# Patient Record
Sex: Female | Born: 1960 | Hispanic: Yes | Marital: Married | Smoking: Never smoker
Health system: Southern US, Community
[De-identification: ages and names within clinical notes are randomized; demographics above are authoritative.]

## PROBLEM LIST (undated history)

## (undated) ENCOUNTER — Ambulatory Visit (HOSPITAL_COMMUNITY): Admission: EM | Payer: Medicare Other | Source: Home / Self Care

## (undated) ENCOUNTER — Emergency Department (HOSPITAL_COMMUNITY): Admission: EM | Payer: 59 | Source: Home / Self Care

## (undated) DIAGNOSIS — F32A Depression, unspecified: Secondary | ICD-10-CM

## (undated) DIAGNOSIS — Z9889 Other specified postprocedural states: Secondary | ICD-10-CM

## (undated) DIAGNOSIS — F4310 Post-traumatic stress disorder, unspecified: Secondary | ICD-10-CM

## (undated) DIAGNOSIS — F319 Bipolar disorder, unspecified: Secondary | ICD-10-CM

## (undated) DIAGNOSIS — F429 Obsessive-compulsive disorder, unspecified: Secondary | ICD-10-CM

## (undated) DIAGNOSIS — F419 Anxiety disorder, unspecified: Secondary | ICD-10-CM

## (undated) HISTORY — DX: Other specified postprocedural states: Z98.890

## (undated) HISTORY — PX: VEIN SURGERY: SHX48

---
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
## 2019-08-01 ENCOUNTER — Ambulatory Visit (HOSPITAL_COMMUNITY): Admission: AD | Admit: 2019-08-01 | Payer: Medicare Other | Source: Home / Self Care | Admitting: Psychiatry

## 2019-08-05 ENCOUNTER — Other Ambulatory Visit: Payer: Self-pay

## 2019-08-05 ENCOUNTER — Emergency Department (HOSPITAL_COMMUNITY)
Admission: EM | Admit: 2019-08-05 | Discharge: 2019-08-05 | Disposition: A | Discharge status: Home / Self Care | Payer: Medicare Other | Attending: Emergency Medicine | Admitting: Emergency Medicine

## 2019-08-05 ENCOUNTER — Emergency Department (HOSPITAL_COMMUNITY)
Admission: EM | Admit: 2019-08-05 | Discharge: 2019-08-05 | Disposition: A | Discharge status: Home / Self Care | Payer: Medicare Other

## 2019-08-05 DIAGNOSIS — F329 Major depressive disorder, single episode, unspecified: Secondary | ICD-10-CM | POA: Insufficient documentation

## 2019-08-05 DIAGNOSIS — Z5321 Procedure and treatment not carried out due to patient leaving prior to being seen by health care provider: Secondary | ICD-10-CM | POA: Diagnosis not present

## 2019-08-05 LAB — COMPREHENSIVE METABOLIC PANEL
ALT: 48 U/L — ABNORMAL HIGH (ref 0–44)
AST: 34 U/L (ref 15–41)
Albumin: 4.3 g/dL (ref 3.5–5.0)
Alkaline Phosphatase: 85 U/L (ref 38–126)
Anion gap: 6 (ref 5–15)
BUN: 6 mg/dL (ref 6–20)
CO2: 26 mmol/L (ref 22–32)
Calcium: 9.3 mg/dL (ref 8.9–10.3)
Chloride: 109 mmol/L (ref 98–111)
Creatinine, Ser: 0.63 mg/dL (ref 0.44–1.00)
GFR calc Af Amer: >60 mL/min (ref >60)
GFR calc non Af Amer: >60 mL/min (ref >60)
Glucose, Bld: 104 mg/dL — ABNORMAL HIGH (ref 70–99)
Potassium: 3.8 mmol/L (ref 3.5–5.1)
Sodium: 141 mmol/L (ref 135–145)
Total Bilirubin: 0.6 mg/dL (ref 0.3–1.2)
Total Protein: 7.8 g/dL (ref 6.5–8.1)

## 2019-08-05 LAB — I-STAT BETA HCG BLOOD, ED (MC, WL, AP ONLY): I-stat hCG, quantitative: 6.5 m[IU]/mL — ABNORMAL HIGH (ref <5)

## 2019-08-05 LAB — RAPID URINE DRUG SCREEN, HOSP PERFORMED
Amphetamines: NOT DETECTED
Barbiturates: NOT DETECTED
Benzodiazepines: POSITIVE — AB
Cocaine: NOT DETECTED
Opiates: NOT DETECTED
Tetrahydrocannabinol: POSITIVE — AB

## 2019-08-05 LAB — CBC
HCT: 42.9 % (ref 36.0–46.0)
Hemoglobin: 14 g/dL (ref 12.0–15.0)
MCH: 29.5 pg (ref 26.0–34.0)
MCHC: 32.6 g/dL (ref 30.0–36.0)
MCV: 90.5 fL (ref 80.0–100.0)
Platelets: 329 10*3/uL (ref 150–400)
RBC: 4.74 MIL/uL (ref 3.87–5.11)
RDW: 12.9 % (ref 11.5–15.5)
WBC: 8.8 10*3/uL (ref 4.0–10.5)
nRBC: 0 % (ref 0.0–0.2)

## 2019-08-05 LAB — ETHANOL: Alcohol, Ethyl (B): <10 mg/dL (ref <10)

## 2019-08-05 NOTE — ED Triage Notes (Signed)
Denies SI/HI, states she is depressed.

## 2019-08-05 NOTE — ED Provider Notes (Signed)
Patient left without being seen after triage.   Deliah Boston, PA-C 08/05/19 1726    Malvin Johns, MD 08/05/19 [phone]

## 2019-08-05 NOTE — ED Notes (Signed)
Pt not present in waiting room, not present in room. Triage nurse Harrison Mons RN verified pt not present in waiting room. Dawn Kelley. PA made aware.

## 2019-08-05 NOTE — ED Triage Notes (Signed)
Pt in [place] with spouse. Pt reports significant psychiatric history, was instructed by her PCP to come here for evaluation. Pt very adamant about not wanting to stay, just wants resources to help her. Pt states she weill not stay if her spouse is not allowed. Pt tearful in triage.

## 2019-08-06 ENCOUNTER — Encounter (HOSPITAL_COMMUNITY): Payer: Self-pay | Admitting: *Deleted

## 2019-08-06 ENCOUNTER — Emergency Department (HOSPITAL_COMMUNITY)
Admission: EM | Admit: 2019-08-06 | Discharge: 2019-08-06 | Disposition: A | Discharge status: Home / Self Care | Payer: Medicare Other | Attending: Emergency Medicine | Admitting: Emergency Medicine

## 2019-08-06 ENCOUNTER — Other Ambulatory Visit: Payer: Self-pay

## 2019-08-06 DIAGNOSIS — R1030 Lower abdominal pain, unspecified: Secondary | ICD-10-CM | POA: Insufficient documentation

## 2019-08-06 DIAGNOSIS — Z5321 Procedure and treatment not carried out due to patient leaving prior to being seen by health care provider: Secondary | ICD-10-CM | POA: Insufficient documentation

## 2019-08-06 DIAGNOSIS — R300 Dysuria: Secondary | ICD-10-CM | POA: Diagnosis not present

## 2019-08-06 HISTORY — DX: Post-traumatic stress disorder, unspecified: F43.10

## 2019-08-06 HISTORY — DX: Anxiety disorder, unspecified: F41.9

## 2019-08-06 HISTORY — DX: Bipolar disorder, unspecified: F31.9

## 2019-08-06 HISTORY — DX: Obsessive-compulsive disorder, unspecified: F42.9

## 2019-08-06 NOTE — ED Triage Notes (Signed)
Pt tearful in triage, states she had a break down 12 days ago. Recent medication changes back in February.  Recent UTI as well, continues to have symptoms-incont at night, lower abd pain, denies burning urination.  Poor appetite. Denies SI

## 2019-08-07 ENCOUNTER — Encounter (HOSPITAL_COMMUNITY): Payer: Self-pay | Admitting: Emergency Medicine

## 2019-08-07 ENCOUNTER — Other Ambulatory Visit: Payer: Self-pay

## 2019-08-07 ENCOUNTER — Emergency Department (HOSPITAL_COMMUNITY)
Admission: EM | Admit: 2019-08-07 | Discharge: 2019-08-07 | Disposition: A | Discharge status: Home / Self Care | Payer: Medicare Other | Attending: Emergency Medicine | Admitting: Emergency Medicine

## 2019-08-07 DIAGNOSIS — Z79899 Other long term (current) drug therapy: Secondary | ICD-10-CM | POA: Diagnosis not present

## 2019-08-07 DIAGNOSIS — F4310 Post-traumatic stress disorder, unspecified: Secondary | ICD-10-CM | POA: Insufficient documentation

## 2019-08-07 DIAGNOSIS — F419 Anxiety disorder, unspecified: Secondary | ICD-10-CM | POA: Insufficient documentation

## 2019-08-07 DIAGNOSIS — F319 Bipolar disorder, unspecified: Secondary | ICD-10-CM | POA: Insufficient documentation

## 2019-08-07 LAB — COMPREHENSIVE METABOLIC PANEL
ALT: 51 U/L — ABNORMAL HIGH (ref 0–44)
AST: 33 U/L (ref 15–41)
Albumin: 4.4 g/dL (ref 3.5–5.0)
Alkaline Phosphatase: 85 U/L (ref 38–126)
Anion gap: 12 (ref 5–15)
BUN: 6 mg/dL (ref 6–20)
CO2: 26 mmol/L (ref 22–32)
Calcium: 9.6 mg/dL (ref 8.9–10.3)
Chloride: 104 mmol/L (ref 98–111)
Creatinine, Ser: 0.54 mg/dL (ref 0.44–1.00)
GFR calc Af Amer: >60 mL/min (ref >60)
GFR calc non Af Amer: >60 mL/min (ref >60)
Glucose, Bld: 118 mg/dL — ABNORMAL HIGH (ref 70–99)
Potassium: 3.9 mmol/L (ref 3.5–5.1)
Sodium: 142 mmol/L (ref 135–145)
Total Bilirubin: 0.4 mg/dL (ref 0.3–1.2)
Total Protein: 8.2 g/dL — ABNORMAL HIGH (ref 6.5–8.1)

## 2019-08-07 LAB — ETHANOL: Alcohol, Ethyl (B): <10 mg/dL (ref <10)

## 2019-08-07 LAB — RAPID URINE DRUG SCREEN, HOSP PERFORMED
Amphetamines: NOT DETECTED
Barbiturates: NOT DETECTED
Benzodiazepines: NOT DETECTED
Cocaine: NOT DETECTED
Opiates: NOT DETECTED
Tetrahydrocannabinol: POSITIVE — AB

## 2019-08-07 LAB — CBC WITH DIFFERENTIAL/PLATELET
Abs Immature Granulocytes: 0.02 10*3/uL (ref 0.00–0.07)
Basophils Absolute: 0 10*3/uL (ref 0.0–0.1)
Basophils Relative: 0 %
Eosinophils Absolute: 0.1 10*3/uL (ref 0.0–0.5)
Eosinophils Relative: 1 %
HCT: 44.2 % (ref 36.0–46.0)
Hemoglobin: 14.6 g/dL (ref 12.0–15.0)
Immature Granulocytes: 0 %
Lymphocytes Relative: 30 %
Lymphs Abs: 2.3 10*3/uL (ref 0.7–4.0)
MCH: 29.7 pg (ref 26.0–34.0)
MCHC: 33 g/dL (ref 30.0–36.0)
MCV: 90 fL (ref 80.0–100.0)
Monocytes Absolute: 0.3 10*3/uL (ref 0.1–1.0)
Monocytes Relative: 5 %
Neutro Abs: 4.9 10*3/uL (ref 1.7–7.7)
Neutrophils Relative %: 64 %
Platelets: 324 10*3/uL (ref 150–400)
RBC: 4.91 MIL/uL (ref 3.87–5.11)
RDW: 12.8 % (ref 11.5–15.5)
WBC: 7.6 10*3/uL (ref 4.0–10.5)
nRBC: 0 % (ref 0.0–0.2)

## 2019-08-07 MED ORDER — HYDROXYZINE HCL 25 MG PO TABS
50.0000 mg | ORAL_TABLET | Freq: Once | ORAL | Status: DC
  Filled 2019-08-07: qty 2

## 2019-08-07 NOTE — ED Notes (Signed)
TTS at bedside. 

## 2019-08-07 NOTE — ED Notes (Signed)
Pt states she does not want to do inpatient treatment. Have notified [facility] of this and they will get back in touch with me.
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
## 2019-08-07 NOTE — BH Assessment (Addendum)
Disposition: Dawn Loveless, DNP recommends in patient treatment. Patient's RN, Charlena Cross, informed of disposition. Reported to RN if patient had concerns about disposition I could tele-psych.  Provider saw patient. She recommends patient be psych cleared with a referral to PHP.

## 2019-08-07 NOTE — Discharge Instructions (Addendum)
Follow-up per behavioral health recommendations for medication management and further treatment.

## 2019-09-26 ENCOUNTER — Other Ambulatory Visit: Payer: Self-pay

## 2019-09-26 ENCOUNTER — Encounter (HOSPITAL_COMMUNITY): Payer: Self-pay

## 2019-09-26 ENCOUNTER — Emergency Department (HOSPITAL_COMMUNITY)
Admission: EM | Admit: 2019-09-26 | Discharge: 2019-09-27 | Disposition: A | Discharge status: Home / Self Care | Payer: Medicare Other | Attending: Emergency Medicine | Admitting: Emergency Medicine

## 2019-09-26 DIAGNOSIS — Z79899 Other long term (current) drug therapy: Secondary | ICD-10-CM | POA: Insufficient documentation

## 2019-09-26 DIAGNOSIS — F319 Bipolar disorder, unspecified: Secondary | ICD-10-CM | POA: Diagnosis not present

## 2019-09-26 DIAGNOSIS — F4310 Post-traumatic stress disorder, unspecified: Secondary | ICD-10-CM | POA: Insufficient documentation

## 2019-09-26 DIAGNOSIS — F419 Anxiety disorder, unspecified: Secondary | ICD-10-CM | POA: Diagnosis not present

## 2019-09-26 DIAGNOSIS — F32A Depression, unspecified: Secondary | ICD-10-CM

## 2019-09-26 DIAGNOSIS — F320 Major depressive disorder, single episode, mild: Secondary | ICD-10-CM | POA: Diagnosis present
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
## 2019-09-27 ENCOUNTER — Encounter (HOSPITAL_COMMUNITY): Payer: Self-pay | Admitting: Emergency Medicine

## 2019-09-27 ENCOUNTER — Emergency Department (HOSPITAL_COMMUNITY)
Admission: EM | Admit: 2019-09-27 | Discharge: 2019-09-28 | Disposition: A | Discharge status: Home / Self Care | Payer: Medicare Other | Source: Home / Self Care | Attending: Emergency Medicine | Admitting: Emergency Medicine

## 2019-09-27 ENCOUNTER — Other Ambulatory Visit: Payer: Self-pay

## 2019-09-27 DIAGNOSIS — Z79899 Other long term (current) drug therapy: Secondary | ICD-10-CM | POA: Insufficient documentation

## 2019-09-27 DIAGNOSIS — F319 Bipolar disorder, unspecified: Secondary | ICD-10-CM | POA: Diagnosis not present

## 2019-09-27 DIAGNOSIS — F419 Anxiety disorder, unspecified: Secondary | ICD-10-CM

## 2019-09-27 LAB — CBC WITH DIFFERENTIAL/PLATELET
Abs Immature Granulocytes: 0.02 10*3/uL (ref 0.00–0.07)
Basophils Absolute: 0.1 10*3/uL (ref 0.0–0.1)
Basophils Relative: 1 %
Eosinophils Absolute: 0.2 10*3/uL (ref 0.0–0.5)
Eosinophils Relative: 2 %
HCT: 42.3 % (ref 36.0–46.0)
Hemoglobin: 13.5 g/dL (ref 12.0–15.0)
Immature Granulocytes: 0 %
Lymphocytes Relative: 37 %
Lymphs Abs: 3.2 10*3/uL (ref 0.7–4.0)
MCH: 29.6 pg (ref 26.0–34.0)
MCHC: 31.9 g/dL (ref 30.0–36.0)
MCV: 92.8 fL (ref 80.0–100.0)
Monocytes Absolute: 0.5 10*3/uL (ref 0.1–1.0)
Monocytes Relative: 6 %
Neutro Abs: 4.6 10*3/uL (ref 1.7–7.7)
Neutrophils Relative %: 54 %
Platelets: 315 10*3/uL (ref 150–400)
RBC: 4.56 MIL/uL (ref 3.87–5.11)
RDW: 13.2 % (ref 11.5–15.5)
WBC: 8.5 10*3/uL (ref 4.0–10.5)
nRBC: 0 % (ref 0.0–0.2)

## 2019-09-27 LAB — COMPREHENSIVE METABOLIC PANEL
ALT: 35 U/L (ref 0–44)
AST: 27 U/L (ref 15–41)
Albumin: 4.1 g/dL (ref 3.5–5.0)
Alkaline Phosphatase: 84 U/L (ref 38–126)
Anion gap: 9 (ref 5–15)
BUN: 15 mg/dL (ref 6–20)
CO2: 28 mmol/L (ref 22–32)
Calcium: 9.1 mg/dL (ref 8.9–10.3)
Chloride: 102 mmol/L (ref 98–111)
Creatinine, Ser: 0.5 mg/dL (ref 0.44–1.00)
GFR calc Af Amer: >60 mL/min (ref >60)
GFR calc non Af Amer: >60 mL/min (ref >60)
Glucose, Bld: 102 mg/dL — ABNORMAL HIGH (ref 70–99)
Potassium: 3.8 mmol/L (ref 3.5–5.1)
Sodium: 139 mmol/L (ref 135–145)
Total Bilirubin: 0.4 mg/dL (ref 0.3–1.2)
Total Protein: 7.9 g/dL (ref 6.5–8.1)

## 2019-09-27 LAB — ETHANOL: Alcohol, Ethyl (B): <10 mg/dL (ref <10)

## 2019-09-27 NOTE — Discharge Instructions (Addendum)
You were evaluated in the Emergency Department and after careful evaluation, we did not find any emergent condition requiring admission or further testing in the hospital.  Your exam/testing today is overall reassuring.  Please keep your follow-up appointment on Wednesday.  Please return to the Emergency Department if you experience any worsening of your condition.  We encourage you to follow up with a primary care provider.  Thank you for allowing [place] to be a part of your care.

## 2019-09-27 NOTE — ED Triage Notes (Signed)
Patient states that her medications are not working for her. Patient has had several medication changes. Patient states that she is unable to sleep and has a hx of PTSD. Patient states highs and lows with her mood. Patient states that she needs help with her medications and that her PTSD is getting worse. Patient states that she is not SI or HI but needs help.
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
## 2019-09-28 DIAGNOSIS — F319 Bipolar disorder, unspecified: Secondary | ICD-10-CM | POA: Diagnosis not present

## 2019-09-28 MED ORDER — ALPRAZOLAM 0.5 MG PO TABS
1.0000 mg | ORAL_TABLET | Freq: Once | ORAL | Status: AC
  Administered 2019-09-28: 1 mg via ORAL
  Filled 2019-09-28: qty 2

## 2019-09-28 MED ORDER — ALPRAZOLAM 1 MG PO TABS: 1.0000 mg | ORAL_TABLET | ORAL | 0 refills | Status: DC | PRN

## 2019-09-28 NOTE — ED Provider Notes (Signed)
[facility] EMERGENCY DEPARTMENT Provider Note   CSN: [id number] Arrival date & time: 09/27/19  2319     History Chief Complaint  Patient presents with  . Medication not working    Dawn Kelley is a 59 y.o. female.  Patient seen here yesterday for the same.  She has severe anxiety and PTSD issues.  She is try to get her medications worked out but feels like she is spiraling down.  She still not suicidal or homicidal.  She states that every aspect of her life is being affected.  She has trouble bathing she has trouble with interpersonal relationships.  No hallucinations or delusions.     Past Medical History:  Diagnosis Date  . Anxiety   . Bipolar 1 disorder ([place])   . OCD (obsessive compulsive disorder)   . PTSD (post-traumatic stress disorder)     There are no problems to display for this patient.   Past Surgical History:  Procedure Laterality Date  . VEIN SURGERY       OB History   No obstetric history on file.     History reviewed. No pertinent family history.  Social History   Tobacco Use  . Smoking status: Never Smoker  . Smokeless tobacco: Never Used  Substance Use Topics  . Alcohol use: Not Currently    Comment: occ.   . Drug use: Yes    Types: Marijuana    Home Medications Prior to Admission medications   Medication Sig Start Date End Date Taking? Authorizing Provider  ALPRAZolam Duanne Moron) 1 MG tablet Take 1 tablet (1 mg total) by mouth as needed for anxiety. 09/28/19   Taino Maertens, Corene Cornea, MD  ARIPiprazole (ABILIFY) 15 MG tablet Take 15 mg by mouth daily. 08/03/19   [provider]  citalopram (CELEXA) 40 MG tablet Take 40 mg by mouth daily. 06/22/19   [provider]  clonazePAM (KLONOPIN) 0.5 MG tablet Take 0.5 mg by mouth 3 (three) times daily as needed. 07/27/19   [provider]  loratadine (CLARITIN) 10 MG tablet Take 10 mg by mouth daily.    [provider]  nitrofurantoin, macrocrystal-monohydrate, (MACROBID) 100  MG capsule Take 100 mg by mouth 2 (two) times daily. 08/03/19   [provider]    Allergies    Seroquel [quetiapine fumarate]  Review of Systems   Review of Systems  All other systems reviewed and are negative.   Physical Exam Updated Vital Signs BP (!) 126/55 (BP Location: Right Arm)   Pulse (!) 59   Temp 97.7 F (36.5 C) (Oral)   Resp 18   Ht 5\' 5"  (1.651 m)   Wt 80.7 kg   SpO2 100%   BMI 29.62 kg/m   Physical Exam Vitals and nursing note reviewed.  Constitutional:      Appearance: She is well-developed.  HENT:     Head: Normocephalic and atraumatic.     Mouth/Throat:     Mouth: Mucous membranes are dry.     Pharynx: Oropharynx is clear.  Eyes:     Conjunctiva/sclera: Conjunctivae normal.  Cardiovascular:     Rate and Rhythm: Normal rate and regular rhythm.  Pulmonary:     Effort: No respiratory distress.     Breath sounds: No stridor.  Abdominal:     General: There is no distension.  Musculoskeletal:        General: Normal range of motion.     Cervical back: Normal range of motion.  Skin:    General: Skin is  warm and dry.  Neurological:     General: No focal deficit present.     Mental Status: She is alert.     ED Results / Procedures / Treatments   Labs (all labs ordered are listed, but only abnormal results are displayed) Labs Reviewed - No data to display  EKG None  Radiology No results found.  Procedures Procedures (including critical care time)  Medications Ordered in ED Medications  ALPRAZolam (XANAX) tablet 1 mg (1 mg Oral Given 09/28/19 0042)    ED Course  I have reviewed the triage vital signs and the nursing notes.  Pertinent labs & imaging results that were available during my care of the patient were reviewed by me and considered in my medical decision making (see chart for details).    MDM Rules/Calculators/A&P   I gave the patient option of reevaluation by TTS versus overnight observation under my care versus  trying Xanax instead of Klonopin for her acute changes.  After some thought and discussion with her husband she decided that she would like to try the Xanax and get a prescription for the same.  She will not take her Klonopin same time.  She will call her psychiatrist tomorrow for further management of her chronic issues.  She is not suicidal or homicidal.  There is no indication for emergent treatment of her symptoms.  Final Clinical Impression(s) / ED Diagnoses Final diagnoses:  Anxiety    Rx / DC Orders ED Discharge Orders         Ordered    ALPRAZolam Duanne Moron) 1 MG tablet  As needed     09/28/19 0043           Giamarie Bueche, Corene Cornea, MD 09/28/19 [phone]

## 2019-09-28 NOTE — Discharge Instructions (Addendum)
Do not take xanax and klonopin together.  Call your psychiatrist tomorrow to try to get an earlier appointment if possible please.   Please return for worsening symptoms or thoughts of hurting self or others.

## 2019-10-01 ENCOUNTER — Emergency Department (HOSPITAL_COMMUNITY)
Admission: EM | Admit: 2019-10-01 | Discharge: 2019-10-02 | Disposition: A | Discharge status: Other Acute Inpatient Hospital | Payer: Medicare Other | Attending: Emergency Medicine | Admitting: Emergency Medicine

## 2019-10-01 ENCOUNTER — Other Ambulatory Visit: Payer: Self-pay

## 2019-10-01 ENCOUNTER — Encounter (HOSPITAL_COMMUNITY): Payer: Self-pay | Admitting: *Deleted

## 2019-10-01 DIAGNOSIS — F411 Generalized anxiety disorder: Secondary | ICD-10-CM | POA: Diagnosis not present

## 2019-10-01 DIAGNOSIS — F4310 Post-traumatic stress disorder, unspecified: Secondary | ICD-10-CM | POA: Insufficient documentation

## 2019-10-01 DIAGNOSIS — Z20822 Contact with and (suspected) exposure to covid-19: Secondary | ICD-10-CM | POA: Insufficient documentation

## 2019-10-01 DIAGNOSIS — F332 Major depressive disorder, recurrent severe without psychotic features: Secondary | ICD-10-CM | POA: Insufficient documentation

## 2019-10-01 DIAGNOSIS — R45851 Suicidal ideations: Secondary | ICD-10-CM | POA: Diagnosis not present

## 2019-10-01 LAB — RAPID URINE DRUG SCREEN, HOSP PERFORMED
Amphetamines: NOT DETECTED
Barbiturates: NOT DETECTED
Benzodiazepines: POSITIVE — AB
Cocaine: NOT DETECTED
Opiates: NOT DETECTED
Tetrahydrocannabinol: POSITIVE — AB

## 2019-10-01 LAB — CBC WITH DIFFERENTIAL/PLATELET
Abs Immature Granulocytes: 0.03 10*3/uL (ref 0.00–0.07)
Basophils Absolute: 0 10*3/uL (ref 0.0–0.1)
Basophils Relative: 0 %
Eosinophils Absolute: 0 10*3/uL (ref 0.0–0.5)
Eosinophils Relative: 0 %
HCT: 41.5 % (ref 36.0–46.0)
Hemoglobin: 13.4 g/dL (ref 12.0–15.0)
Immature Granulocytes: 0 %
Lymphocytes Relative: 25 %
Lymphs Abs: 2.2 10*3/uL (ref 0.7–4.0)
MCH: 29.9 pg (ref 26.0–34.0)
MCHC: 32.3 g/dL (ref 30.0–36.0)
MCV: 92.6 fL (ref 80.0–100.0)
Monocytes Absolute: 0.4 10*3/uL (ref 0.1–1.0)
Monocytes Relative: 4 %
Neutro Abs: 6.3 10*3/uL (ref 1.7–7.7)
Neutrophils Relative %: 71 %
Platelets: 322 10*3/uL (ref 150–400)
RBC: 4.48 MIL/uL (ref 3.87–5.11)
RDW: 13.1 % (ref 11.5–15.5)
WBC: 8.9 10*3/uL (ref 4.0–10.5)
nRBC: 0 % (ref 0.0–0.2)

## 2019-10-01 LAB — BASIC METABOLIC PANEL
Anion gap: 9 (ref 5–15)
BUN: 8 mg/dL (ref 6–20)
CO2: 29 mmol/L (ref 22–32)
Calcium: 9.5 mg/dL (ref 8.9–10.3)
Chloride: 101 mmol/L (ref 98–111)
Creatinine, Ser: 0.52 mg/dL (ref 0.44–1.00)
GFR calc Af Amer: >60 mL/min (ref >60)
GFR calc non Af Amer: >60 mL/min (ref >60)
Glucose, Bld: 108 mg/dL — ABNORMAL HIGH (ref 70–99)
Potassium: 4 mmol/L (ref 3.5–5.1)
Sodium: 139 mmol/L (ref 135–145)

## 2019-10-01 LAB — ACETAMINOPHEN LEVEL: Acetaminophen (Tylenol), Serum: <10 ug/mL — ABNORMAL LOW (ref 10–30)

## 2019-10-01 LAB — SALICYLATE LEVEL: Salicylate Lvl: <7 mg/dL — ABNORMAL LOW (ref 7.0–30.0)

## 2019-10-01 LAB — ETHANOL: Alcohol, Ethyl (B): <10 mg/dL (ref <10)

## 2019-10-01 LAB — PREGNANCY, URINE: Preg Test, Ur: NEGATIVE

## 2019-10-01 MED ORDER — ALPRAZOLAM 0.5 MG PO TABS
1.0000 mg | ORAL_TABLET | ORAL | Status: DC | PRN
  Administered 2019-10-01 – 2019-10-02 (×3): 1 mg via ORAL
  Filled 2019-10-01 (×3): qty 2

## 2019-10-01 MED ORDER — MELATONIN 3 MG PO TABS
3.0000 mg | ORAL_TABLET | Freq: Every day | ORAL | Status: DC
  Administered 2019-10-01: 3 mg via ORAL
  Filled 2019-10-01: qty 1

## 2019-10-01 MED ORDER — ARIPIPRAZOLE 5 MG PO TABS
10.0000 mg | ORAL_TABLET | Freq: Every day | ORAL | Status: DC
  Administered 2019-10-02: 10 mg via ORAL
  Filled 2019-10-01 (×2): qty 2

## 2019-10-01 MED ORDER — FLUOXETINE HCL 20 MG PO CAPS
40.0000 mg | ORAL_CAPSULE | Freq: Every day | ORAL | Status: DC
  Administered 2019-10-02: 40 mg via ORAL
  Filled 2019-10-01 (×2): qty 2

## 2019-10-01 NOTE — ED Notes (Signed)
Pt TTS'd
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
## 2019-10-01 NOTE — ED Triage Notes (Signed)
Pt with SI, plan to jump in front of a truck.

## 2019-10-01 NOTE — ED Provider Notes (Signed)
[facility] Provider Note   CSN: [id number] Arrival date & time: 10/01/19  1321     History Chief Complaint  Patient presents with  . Suicidal    Dawn Kelley is a 59 y.o. female past medical history of anxiety, bipolar, PTSD who presents for evaluation of suicidal ideations.  She reports she has been on Klonopin for a very long time to help with her anxiety and depression.  She states that a few months ago, the Klonopin stopped working and she was unable to have improvement in her symptoms.  She was switched and her medications were adjusted.  She had started Abilify and Prozac.  Patient reports that over the last few months since switching those medications, she is continued have worsening depression, anxiety.  She feels like it is worse in the last several weeks.  She states that she cannot even take a shower because she gets so anxious that she is going to drown and she ends up swallowing the water.  She states she has not been able to do any of her daily activities and can barely get out of bed.  She reports that over the last few days, she started having suicidal ideations.  She states that she started noticing that "I 60 was right out front of her door and she thought about jumping in front of a truck to end it."  She states "I am too much of a coward to take all my pills so I would do it that way."  She does report eating increased stress as her daughter was recently put in prison which she states has been contributing to her anxiety and depression.  She does endorse drinking alcohol and drinks about 1-2 beers over the last few days.  She does intermittently smoke marijuana.  Unsure when her last time was.  No cocaine use.  The history is provided by the patient.       Past Medical History:  Diagnosis Date  . Anxiety   . Bipolar 1 disorder ([place])   . OCD (obsessive compulsive disorder)   . PTSD (post-traumatic stress disorder)     There are no problems to  display for this patient.   Past Surgical History:  Procedure Laterality Date  . VEIN SURGERY       OB History   No obstetric history on file.     History reviewed. No pertinent family history.  Social History   Tobacco Use  . Smoking status: Never Smoker  . Smokeless tobacco: Never Used  Substance Use Topics  . Alcohol use: Not Currently    Comment: occ.   . Drug use: Yes    Types: Marijuana    Home Medications Prior to Admission medications   Medication Sig Start Date End Date Taking? Authorizing Provider  ALPRAZolam Duanne Moron) 1 MG tablet Take 1 tablet (1 mg total) by mouth as needed for anxiety. 09/28/19  Yes Mesner, Corene Cornea, MD  ARIPiprazole (ABILIFY) 10 MG tablet Take 10 mg by mouth daily.  08/03/19  Yes [provider]  ergocalciferol (VITAMIN D2) 1.25 MG (50000 UT) capsule Take 50,000 Units by mouth once a week.   Yes [provider]  FLUoxetine (PROZAC) 40 MG capsule Take 40 mg by mouth daily. 09/21/19  Yes [provider]  loratadine (CLARITIN) 10 MG tablet Take 10 mg by mouth daily.   Yes [provider]  Melatonin 5 MG CAPS Take 5 mg by mouth at bedtime.   Yes [provider]    Allergies    Seroquel [quetiapine fumarate]  Review of Systems   Review of Systems  Psychiatric/Behavioral: Positive for dysphoric mood and self-injury. The patient is nervous/anxious.   All other systems reviewed and are negative.   Physical Exam Updated Vital Signs BP 127/60 (BP Location: Right Arm)   Pulse 86   Temp (!) 97 F (36.1 C) (Temporal)   Resp (!) 22   Ht 5' 4.5" (1.638 m)   Wt 83.5 kg   SpO2 96%   BMI 31.10 kg/m   Physical Exam Vitals and nursing note reviewed.  Constitutional:      Appearance: Normal appearance. She is well-developed.  HENT:     Head: Normocephalic and atraumatic.  Eyes:     General: Lids are normal.     Conjunctiva/sclera: Conjunctivae normal.     Pupils: Pupils are equal, round, and reactive  to light.  Cardiovascular:     Rate and Rhythm: Normal rate and regular rhythm.     Pulses: Normal pulses.     Heart sounds: Normal heart sounds. No murmur. No friction rub. No gallop.   Pulmonary:     Effort: Pulmonary effort is normal.     Breath sounds: Normal breath sounds.  Abdominal:     Palpations: Abdomen is soft. Abdomen is not rigid.     Tenderness: There is no abdominal tenderness. There is no guarding.  Musculoskeletal:        General: Normal range of motion.     Cervical back: Full passive range of motion without pain.  Skin:    General: Skin is warm and dry.     Capillary Refill: Capillary refill takes less than 2 seconds.  Neurological:     Mental Status: She is alert and oriented to person, place, and time.  Psychiatric:        Speech: Speech normal.        Thought Content: Thought content includes suicidal ideation. Thought content does not include homicidal ideation. Thought content includes suicidal plan. Thought content does not include homicidal plan.     Comments: Intermittently tearful, anxious. Makes good eye contact.     ED Results / Procedures / Treatments   Labs (all labs ordered are listed, but only abnormal results are displayed) Labs Reviewed  BASIC METABOLIC PANEL - Abnormal; Notable for the following components:      Result Value   Glucose, Bld 108 (*)    All other components within normal limits  ACETAMINOPHEN LEVEL - Abnormal; Notable for the following components:   Acetaminophen (Tylenol), Serum <10 (*)    All other components within normal limits  SALICYLATE LEVEL - Abnormal; Notable for the following components:   Salicylate Lvl [id number] (*)    All other components within normal limits  RAPID URINE DRUG SCREEN, HOSP PERFORMED - Abnormal; Notable for the following components:   Benzodiazepines POSITIVE (*)    Tetrahydrocannabinol POSITIVE (*)    All other components within normal limits  SARS CORONAVIRUS 2 BY RT PCR (HOSPITAL ORDER,  [place] [facility])  CBC WITH DIFFERENTIAL/PLATELET  ETHANOL  PREGNANCY, URINE    EKG None  Radiology No results found.  Procedures Procedures (including critical care time)  Medications Ordered in ED Medications  ALPRAZolam (XANAX) tablet 1 mg (has no administration in time range)  ARIPiprazole (ABILIFY) tablet 10 mg (has no administration in time range)  FLUoxetine (PROZAC) capsule 40 mg (has no administration in time range)  melatonin tablet 3  mg (has no administration in time range)    ED Course  I have reviewed the triage vital signs and the nursing notes.  Pertinent labs & imaging results that were available during my care of the patient were reviewed by me and considered in my medical decision making (see chart for details).    MDM Rules/Calculators/A&P                      59 year old female who presents for evaluation of suicidal ideation.  Reports worsening depression over the last few months after switching her Klonopin to a different medication.  She states that she thought about jumping out in the highway in front of a truck so that she would die.  On initially arrival, she is afebrile, nontoxic-appearing.  Vital signs are stable.  We will plan for medical clearance labs, TTS consultation.  Patient is voluntary.  Salicylate, acetaminophen, ethanol level unremarkable.  BMP normal.  CBC without any acute abnormalities.  UDS is positive for benzos and marijuana.  Patient is medically cleared.  She is pending TTS consult.  TTS is recommended inpatient.  Patient is voluntary at this time.  Portions of this note were generated with [employer]. Dictation errors may occur despite best attempts at proofreading.   Final Clinical Impression(s) / ED Diagnoses Final diagnoses:  Suicidal ideation    Rx / DC Orders ED Discharge Orders    None       Desma Mcgregor 10/01/19 2111    Fredia Sorrow, MD 11/03/19 Bosie Helper

## 2019-10-01 NOTE — BHH Counselor (Signed)
[facility] providers Mordecai Maes, PMHNP and Letitia Libra, FNP reviewed patient's chart and active prescriptions. Patient last had Klonopin filled on 5/6 and has a current Xanax prescription. Apolonio Schneiders, RN notified. She states they are aware at [facility] ED.

## 2019-10-02 DIAGNOSIS — F411 Generalized anxiety disorder: Secondary | ICD-10-CM | POA: Diagnosis not present

## 2019-10-02 LAB — SARS CORONAVIRUS 2 BY RT PCR (HOSPITAL ORDER, PERFORMED IN ~~LOC~~ HOSPITAL LAB): SARS Coronavirus 2: NEGATIVE

## 2019-10-02 MED ORDER — SENNOSIDES-DOCUSATE SODIUM 8.6-50 MG PO TABS
1.0000 | ORAL_TABLET | Freq: Once | ORAL | Status: AC
  Administered 2019-10-02: 1 via ORAL
  Filled 2019-10-02 (×2): qty 1

## 2019-10-02 NOTE — ED Notes (Signed)
Transport here to pick up patient.

## 2019-10-02 NOTE — ED Notes (Signed)
Called [facility] to transport Pt to [facility].

## 2019-10-02 NOTE — Progress Notes (Signed)
Pt accepted to [facility]   Call report to [phone]  Eboni @ [facility] ED notified.     Pt is voluntary and will be transported by [employer]    Pt may arrive to [facility] after testing negative for Covid.   Audree Camel, LCSW, [place] Disposition [facility] B[facility]/TTS [phone]

## 2019-10-02 NOTE — ED Notes (Signed)
Pt is requesting a stool softener. Will notify doctor

## 2019-10-02 NOTE — ED Notes (Signed)
Pt is ambulatory to the restroom

## 2019-10-02 NOTE — Progress Notes (Signed)
Pt meets inpatient criteria per Letitia Libra, NP. Referral information has been sent to the following hospitals for review:  [facility] Details  [facility] Details [facility] Details  [employer] [facility] Details [facility] Details [facility]  Disposition will continue to follow.   Audree Camel, LCSW, [place] Disposition [facility]/TTS [phone]

## 2019-10-21 ENCOUNTER — Encounter (HOSPITAL_COMMUNITY): Payer: Self-pay | Admitting: Emergency Medicine

## 2019-10-21 ENCOUNTER — Emergency Department (HOSPITAL_COMMUNITY)
Admission: EM | Admit: 2019-10-21 | Discharge: 2019-10-21 | Disposition: A | Discharge status: Home / Self Care | Payer: Medicare Other | Attending: Emergency Medicine | Admitting: Emergency Medicine

## 2019-10-21 DIAGNOSIS — F419 Anxiety disorder, unspecified: Secondary | ICD-10-CM | POA: Insufficient documentation

## 2019-10-21 DIAGNOSIS — Z20822 Contact with and (suspected) exposure to covid-19: Secondary | ICD-10-CM | POA: Insufficient documentation

## 2019-10-21 DIAGNOSIS — Z79899 Other long term (current) drug therapy: Secondary | ICD-10-CM | POA: Diagnosis not present

## 2019-10-21 DIAGNOSIS — Z888 Allergy status to other drugs, medicaments and biological substances status: Secondary | ICD-10-CM | POA: Insufficient documentation

## 2019-10-21 LAB — COMPREHENSIVE METABOLIC PANEL
ALT: 22 U/L (ref 0–44)
AST: 15 U/L (ref 15–41)
Albumin: 3.9 g/dL (ref 3.5–5.0)
Alkaline Phosphatase: 85 U/L (ref 38–126)
Anion gap: 12 (ref 5–15)
BUN: 10 mg/dL (ref 6–20)
CO2: 26 mmol/L (ref 22–32)
Calcium: 9.3 mg/dL (ref 8.9–10.3)
Chloride: 101 mmol/L (ref 98–111)
Creatinine, Ser: 0.63 mg/dL (ref 0.44–1.00)
GFR calc Af Amer: >60 mL/min (ref >60)
GFR calc non Af Amer: >60 mL/min (ref >60)
Glucose, Bld: 108 mg/dL — ABNORMAL HIGH (ref 70–99)
Potassium: 4.3 mmol/L (ref 3.5–5.1)
Sodium: 139 mmol/L (ref 135–145)
Total Bilirubin: 0.3 mg/dL (ref 0.3–1.2)
Total Protein: 7.6 g/dL (ref 6.5–8.1)

## 2019-10-21 LAB — CBC WITH DIFFERENTIAL/PLATELET
Abs Immature Granulocytes: 0.02 10*3/uL (ref 0.00–0.07)
Basophils Absolute: 0 10*3/uL (ref 0.0–0.1)
Basophils Relative: 0 %
Eosinophils Absolute: 0 10*3/uL (ref 0.0–0.5)
Eosinophils Relative: 0 %
HCT: 42.7 % (ref 36.0–46.0)
Hemoglobin: 13.9 g/dL (ref 12.0–15.0)
Immature Granulocytes: 0 %
Lymphocytes Relative: 22 %
Lymphs Abs: 1.8 10*3/uL (ref 0.7–4.0)
MCH: 29.6 pg (ref 26.0–34.0)
MCHC: 32.6 g/dL (ref 30.0–36.0)
MCV: 91 fL (ref 80.0–100.0)
Monocytes Absolute: 0.4 10*3/uL (ref 0.1–1.0)
Monocytes Relative: 5 %
Neutro Abs: 6 10*3/uL (ref 1.7–7.7)
Neutrophils Relative %: 73 %
Platelets: 337 10*3/uL (ref 150–400)
RBC: 4.69 MIL/uL (ref 3.87–5.11)
RDW: 12.9 % (ref 11.5–15.5)
WBC: 8.3 10*3/uL (ref 4.0–10.5)
nRBC: 0 % (ref 0.0–0.2)

## 2019-10-21 LAB — RAPID URINE DRUG SCREEN, HOSP PERFORMED
Amphetamines: NOT DETECTED
Barbiturates: NOT DETECTED
Benzodiazepines: POSITIVE — AB
Cocaine: NOT DETECTED
Opiates: NOT DETECTED
Tetrahydrocannabinol: NOT DETECTED

## 2019-10-21 LAB — ETHANOL: Alcohol, Ethyl (B): <10 mg/dL (ref <10)

## 2019-10-21 LAB — POC URINE PREG, ED: Preg Test, Ur: NEGATIVE

## 2019-10-21 LAB — SARS CORONAVIRUS 2 BY RT PCR (HOSPITAL ORDER, PERFORMED IN ~~LOC~~ HOSPITAL LAB): SARS Coronavirus 2: NEGATIVE

## 2019-10-21 MED ORDER — LORAZEPAM 1 MG PO TABS
1.0000 mg | ORAL_TABLET | Freq: Once | ORAL | Status: AC
  Administered 2019-10-21: 1 mg via ORAL
  Filled 2019-10-21: qty 1
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
## 2019-10-21 NOTE — ED Notes (Signed)
Attempted to call [facility] and inquire about outpatient resources. [place] reported would inform CSW on case. No answer. Pt came up to nurses station reporting ride was at [facility] and was fixing to leave. Discharge instructions reviewed.

## 2019-10-21 NOTE — Discharge Instructions (Signed)
Please see that attached list - for follow up  ER for worsening symptoms

## 2019-10-21 NOTE — ED Notes (Signed)
Per [facility], pt has been psych cleared and requested ED fax number to send over resources for pt.

## 2019-10-21 NOTE — Clinical Social Work Note (Signed)
Transition of Care [facility]) - Emergency Department Mini Assessment  Patient Details  Name: Dawn Kelley MRN: [id number] Date of Birth: 03-Aug-1960  Transition of Care [facility]) CM/SW Contact:    Sherie Don, LCSW Phone Number: 10/21/2019, 1:16 PM  Clinical Narrative: CSW reviewed patient's chart and patient needs assistance with mental health resources in the community now that she is psych cleared. CSW met with patient in the ED. Patient agreeable to CSW making a referral.  CSW called [facility] in [place] [phone]) and spoke with Tia to make referral. CSW met with patient again and explained patient will be contacted by [facility] to set up an initial appointment and patient will need to arrive early to fill out the new patient paperwork. CSW explained to patient it will be important for her to keep her mental health appointments to help her manage her anxiety and other symptoms long term. Patient appreciative of referral. EDP updated.  ED Mini Assessment: What brought you to the Emergency Department? : Paranoia Barriers to Discharge: ED Barriers Resolved Barrier interventions: Referral to [facility] for mental health counseling Means of departure: Car Interventions which prevented an admission or readmission: Other (must enter comment)(Referral for mental health counseling)  Patient Contact and Communications Key Contact 1: [facility] Contact Date: 10/21/19,   Contact time: [place] Contact Phone Number: [phone] Call outcome: Referral has been made  Patient states their goals for this hospitalization and ongoing recovery are:: Be able to cope with anxiety  Admission diagnosis:  PARANOID There are no problems to display for this patient.  PCP:  Wannetta Sender, FNP Pharmacy:   [facility], [place] [address] [place] [address] Phone: [phone] Fax: [phone]

## 2019-10-21 NOTE — ED Triage Notes (Signed)
Patient discharged from mental facility last week, and has not be able to cope since leaving facility.  Denies SI/HI.

## 2019-10-24 ENCOUNTER — Ambulatory Visit (HOSPITAL_COMMUNITY)
Admission: AD | Admit: 2019-10-24 | Discharge: 2019-10-24 | Disposition: A | Discharge status: Home / Self Care | Payer: Medicare Other | Attending: Psychiatry | Admitting: Psychiatry

## 2019-10-29 NOTE — Progress Notes (Deleted)
Psychiatric Initial Adult Assessment   Patient Identification: Dawn Kelley MRN:  [id number] Date of Evaluation:  10/29/2019 Referral Source: [facility]* Chief Complaint:   Visit Diagnosis: No diagnosis found.  History of Present Illness:   Dawn Kelley is a 59 y.o. year old female with a history of bipolar, OCD, PTSD per chart, stroke, who is referred for   Passive SI      Associated Signs/Symptoms: Depression Symptoms:  {DEPRESSION SYMPTOMS:20000} (Hypo) Manic Symptoms:  {BHH MANIC SYMPTOMS:22872} Anxiety Symptoms:  {BHH ANXIETY SYMPTOMS:22873} Psychotic Symptoms:  {BHH PSYCHOTIC SYMPTOMS:22874} PTSD Symptoms: {BHH PTSD SYMPTOMS:22875}  Past Psychiatric History: ***  Previous Psychotropic Medications: {YES/NO:21197}  Substance Abuse History in the last 12 months:  {yes no:314532}  Consequences of Substance Abuse: {BHH CONSEQUENCES OF SUBSTANCE ABUSE:22880}  Past Medical History:  Past Medical History:  Diagnosis Date  . Anxiety   . Bipolar 1 disorder ([place])   . OCD (obsessive compulsive disorder)   . PTSD (post-traumatic stress disorder)     Past Surgical History:  Procedure Laterality Date  . VEIN SURGERY      Family Psychiatric History:  Outpatient:  Psychiatry admission:  Previous suicide attempt:  Past trials of medication:  History of violence:   Family History: No family history on file.  Social History:   Social History   Socioeconomic History  . Marital status: Married    Spouse name: Not on file  . Number of children: Not on file  . Years of education: Not on file  . Highest education level: Not on file  Occupational History  . Occupation: Disabled  Tobacco Use  . Smoking status: Never Smoker  . Smokeless tobacco: Never Used  Substance and Sexual Activity  . Alcohol use: Not Currently    Comment: occ.   . Drug use: Yes    Types: Marijuana    Comment: Pt denied current use  . Sexual activity: Yes  Other Topics  Concern  . Not on file  Social History Narrative   Pt lives in [place] with husband.  She is on disability.  Pt stated that she receives outpatient psychiatry services through [facility].  She does not receive outpatient therapy.   Social Determinants of Health   Financial Resource Strain:   . Difficulty of Paying Living Expenses:   Food Insecurity:   . Worried About Charity fundraiser in the Last Year:   . Arboriculturist in the Last Year:   Transportation Needs:   . Film/video editor (Medical):   Marland Kitchen Lack of Transportation (Non-Medical):   Physical Activity:   . Days of Exercise per Week:   . Minutes of Exercise per Session:   Stress:   . Feeling of Stress :   Social Connections:   . Frequency of Communication with Friends and Family:   . Frequency of Social Gatherings with Friends and Family:   . Attends Religious Services:   . Active Member of Clubs or Organizations:   . Attends Archivist Meetings:   Marland Kitchen Marital Status:     Additional Social History: ***  Allergies:   Allergies  Allergen Reactions  . Seroquel [Quetiapine Fumarate] Shortness Of Breath    Metabolic Disorder Labs: No results found for: HGBA1C, MPG No results found for: PROLACTIN No results found for: CHOL, TRIG, HDL, CHOLHDL, VLDL, LDLCALC No results found for: TSH  Therapeutic Level Labs: No results found for: LITHIUM No results found for: CBMZ No results found for: VALPROATE  Current Medications: Current Outpatient Medications  Medication Sig Dispense Refill  . ALPRAZolam (XANAX) 1 MG tablet Take 1 tablet (1 mg total) by mouth as needed for anxiety. 30 tablet 0  . ARIPiprazole (ABILIFY) 10 MG tablet Take 10 mg by mouth daily.     . ergocalciferol (VITAMIN D2) 1.25 MG (50000 UT) capsule Take 50,000 Units by mouth once a week.    Marland Kitchen FLUoxetine (PROZAC) 40 MG capsule Take 40 mg by mouth daily.    Marland Kitchen loratadine (CLARITIN) 10 MG tablet Take 10 mg by mouth daily.    . Melatonin  5 MG CAPS Take 5 mg by mouth at bedtime.     No current facility-administered medications for this visit.    Musculoskeletal: Strength & Muscle Tone: N/A Gait & Station: N/A Patient leans: N/A  Psychiatric Specialty Exam: Review of Systems  There were no vitals taken for this visit.There is no height or weight on file to calculate BMI.  General Appearance: {Appearance:22683}  Eye Contact:  {BHH EYE CONTACT:22684}  Speech:  Clear and Coherent  Volume:  Normal  Mood:  {BHH MOOD:22306}  Affect:  {Affect (PAA):22687}  Thought Process:  Coherent  Orientation:  Full (Time, Place, and Person)  Thought Content:  Logical  Suicidal Thoughts:  {ST/HT (PAA):22692}  Homicidal Thoughts:  {ST/HT (PAA):22692}  Memory:  Immediate;   Good  Judgement:  {Judgement (PAA):22694}  Insight:  {Insight (PAA):22695}  Psychomotor Activity:  Normal  Concentration:  Concentration: Good and Attention Span: Good  Recall:  Good  Fund of Knowledge:Good  Language: Good  Akathisia:  No  Handed:  Right  AIMS (if indicated):  not done  Assets:  Communication Skills Desire for Improvement  ADL's:  Intact  Cognition: WNL  Sleep:  {BHH GOOD/FAIR/POOR:22877}   Screenings:   Assessment and Plan:    Plan  The patient demonstrates the following risk factors for suicide: Chronic risk factors for suicide include: {Chronic Risk Factors for DCVUDTH:43888757}. Acute risk factors for suicide include: {Acute Risk Factors for VJKQASU:01561537}. Protective factors for this patient include: {Protective Factors for Suicide HKFE:76147092}. Considering these factors, the overall suicide risk at this point appears to be {Desc; low/moderate/high:110033}. Patient {ACTION; IS/IS HVF:47340370} appropriate for outpatient follow up.   Norman Clay, MD 6/17/20213:51 PM

## 2019-11-04 ENCOUNTER — Telehealth (HOSPITAL_COMMUNITY): Payer: Medicare Other | Admitting: Psychiatry

## 2019-11-11 ENCOUNTER — Telehealth (HOSPITAL_COMMUNITY): Payer: Self-pay | Admitting: Psychiatry

## 2019-11-11 NOTE — Telephone Encounter (Signed)
Patients husband called in to advise patient is in the hospital and will schedule appointment when she is discharged.

## 2019-11-18 ENCOUNTER — Emergency Department (HOSPITAL_COMMUNITY)
Admission: EM | Admit: 2019-11-18 | Discharge: 2019-11-18 | Disposition: A | Discharge status: Home / Self Care | Payer: Medicare Other | Attending: Emergency Medicine | Admitting: Emergency Medicine

## 2019-11-18 ENCOUNTER — Emergency Department (HOSPITAL_COMMUNITY): Payer: Medicare Other

## 2019-11-18 ENCOUNTER — Encounter (HOSPITAL_COMMUNITY): Payer: Self-pay | Admitting: *Deleted

## 2019-11-18 ENCOUNTER — Other Ambulatory Visit: Payer: Self-pay

## 2019-11-18 DIAGNOSIS — N390 Urinary tract infection, site not specified: Secondary | ICD-10-CM | POA: Diagnosis not present

## 2019-11-18 DIAGNOSIS — Z79899 Other long term (current) drug therapy: Secondary | ICD-10-CM | POA: Insufficient documentation

## 2019-11-18 DIAGNOSIS — R2681 Unsteadiness on feet: Secondary | ICD-10-CM | POA: Diagnosis not present

## 2019-11-18 DIAGNOSIS — R251 Tremor, unspecified: Secondary | ICD-10-CM | POA: Diagnosis present

## 2019-11-18 LAB — RAPID URINE DRUG SCREEN, HOSP PERFORMED
Amphetamines: NOT DETECTED
Barbiturates: NOT DETECTED
Benzodiazepines: POSITIVE — AB
Cocaine: NOT DETECTED
Opiates: NOT DETECTED
Tetrahydrocannabinol: POSITIVE — AB

## 2019-11-18 LAB — CBC WITH DIFFERENTIAL/PLATELET
Abs Immature Granulocytes: 0.02 10*3/uL (ref 0.00–0.07)
Basophils Absolute: 0 10*3/uL (ref 0.0–0.1)
Basophils Relative: 0 %
Eosinophils Absolute: 0.1 10*3/uL (ref 0.0–0.5)
Eosinophils Relative: 1 %
HCT: 39.2 % (ref 36.0–46.0)
Hemoglobin: 12.9 g/dL (ref 12.0–15.0)
Immature Granulocytes: 0 %
Lymphocytes Relative: 24 %
Lymphs Abs: 1.9 10*3/uL (ref 0.7–4.0)
MCH: 29.7 pg (ref 26.0–34.0)
MCHC: 32.9 g/dL (ref 30.0–36.0)
MCV: 90.1 fL (ref 80.0–100.0)
Monocytes Absolute: 0.6 10*3/uL (ref 0.1–1.0)
Monocytes Relative: 7 %
Neutro Abs: 5.2 10*3/uL (ref 1.7–7.7)
Neutrophils Relative %: 68 %
Platelets: 297 10*3/uL (ref 150–400)
RBC: 4.35 MIL/uL (ref 3.87–5.11)
RDW: 12.8 % (ref 11.5–15.5)
WBC: 7.8 10*3/uL (ref 4.0–10.5)
nRBC: 0 % (ref 0.0–0.2)

## 2019-11-18 LAB — URINALYSIS, ROUTINE W REFLEX MICROSCOPIC
Bacteria, UA: NONE SEEN
Bilirubin Urine: NEGATIVE
Glucose, UA: NEGATIVE mg/dL
Hgb urine dipstick: NEGATIVE
Ketones, ur: NEGATIVE mg/dL
Nitrite: NEGATIVE
Protein, ur: NEGATIVE mg/dL
Specific Gravity, Urine: 1.017 (ref 1.005–1.030)
pH: 6 (ref 5.0–8.0)

## 2019-11-18 LAB — COMPREHENSIVE METABOLIC PANEL
ALT: 20 U/L (ref 0–44)
AST: 19 U/L (ref 15–41)
Albumin: 3.8 g/dL (ref 3.5–5.0)
Alkaline Phosphatase: 84 U/L (ref 38–126)
Anion gap: 9 (ref 5–15)
BUN: 10 mg/dL (ref 6–20)
CO2: 26 mmol/L (ref 22–32)
Calcium: 8.8 mg/dL — ABNORMAL LOW (ref 8.9–10.3)
Chloride: 100 mmol/L (ref 98–111)
Creatinine, Ser: 0.61 mg/dL (ref 0.44–1.00)
GFR calc Af Amer: >60 mL/min (ref >60)
GFR calc non Af Amer: >60 mL/min (ref >60)
Glucose, Bld: 114 mg/dL — ABNORMAL HIGH (ref 70–99)
Potassium: 3.9 mmol/L (ref 3.5–5.1)
Sodium: 135 mmol/L (ref 135–145)
Total Bilirubin: 0.3 mg/dL (ref 0.3–1.2)
Total Protein: 7.5 g/dL (ref 6.5–8.1)

## 2019-11-18 IMAGING — CT CT HEAD W/O CM
3 series · 16 of 47 positions shown, 19 images · non-contrast
Comparison: Insert CT angio head [DATE]

CLINICAL DATA: Ataxia.

EXAM:
CT HEAD WITHOUT CONTRAST
TECHNIQUE: Contiguous axial images were obtained from the base of the skull
through the vertex without intravenous contrast.

[Series 2: head w o · axial · 0.44mm/px · z∈[+26,+151]mm · 10 of 31 slices shown, 13 images]
[im 3/31  brain]
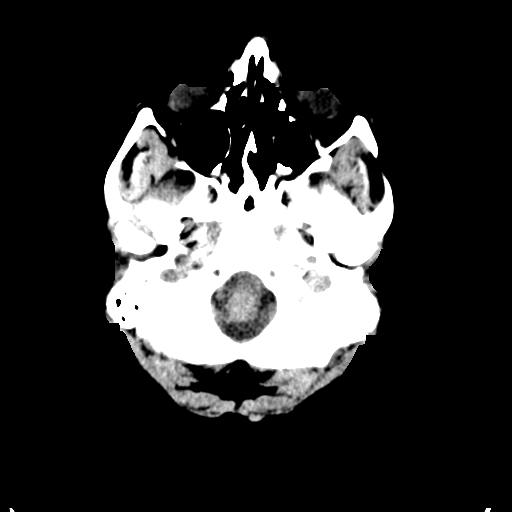
[im 3/31  bone]
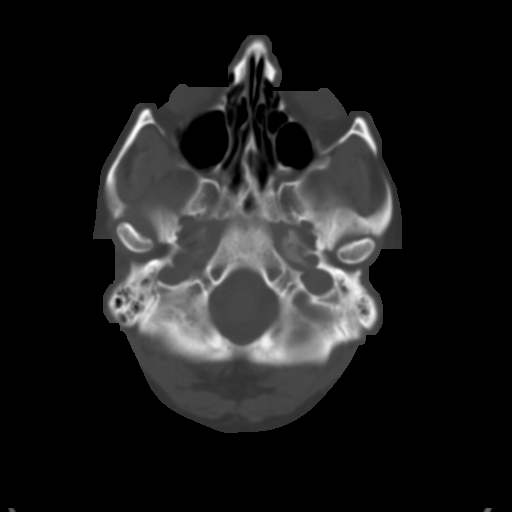
[im 6/31  brain]
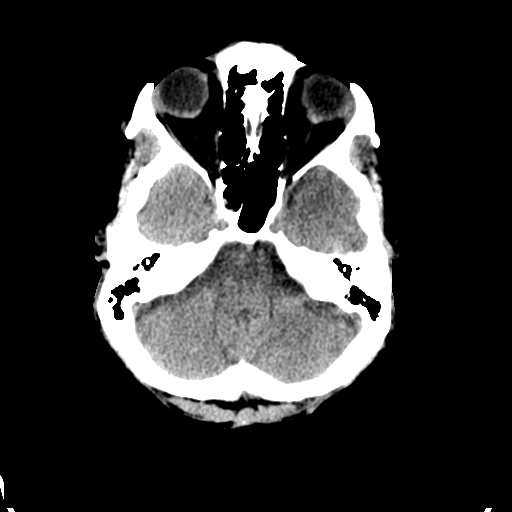
[im 9/31  brain]
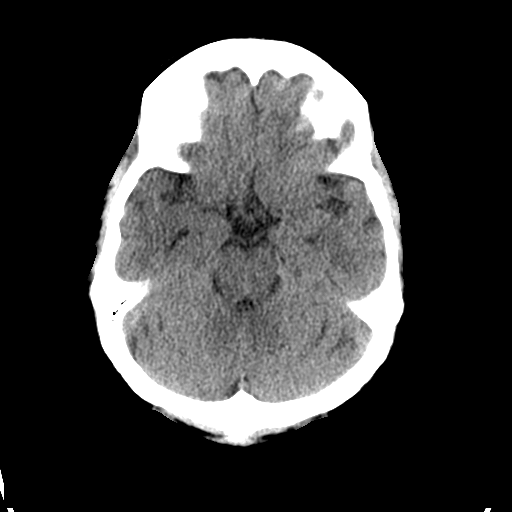
[im 11/31  brain]
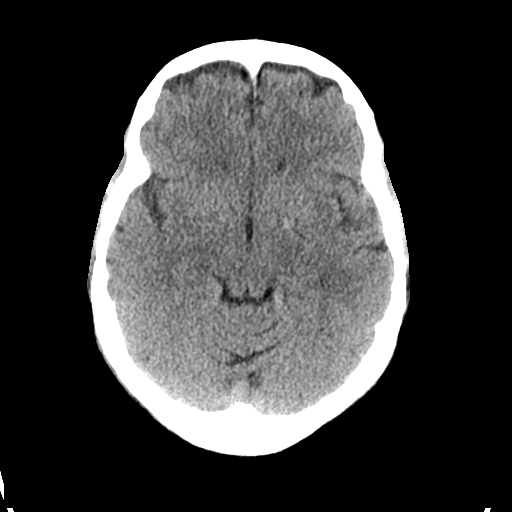
[im 14/31  brain]
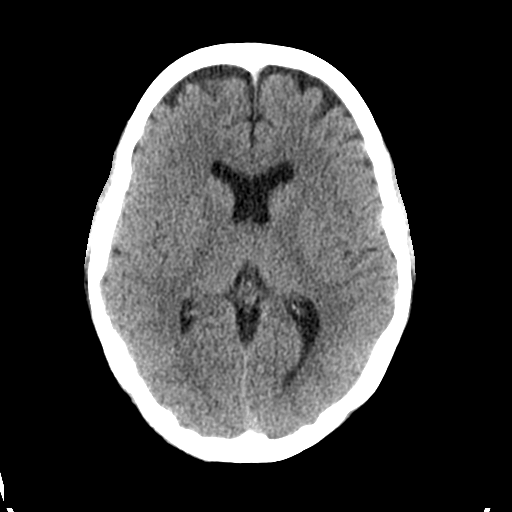
[im 14/31  bone]
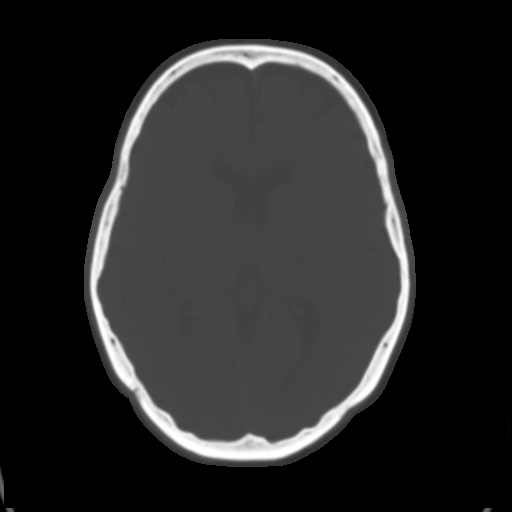
[im 17/31  brain]
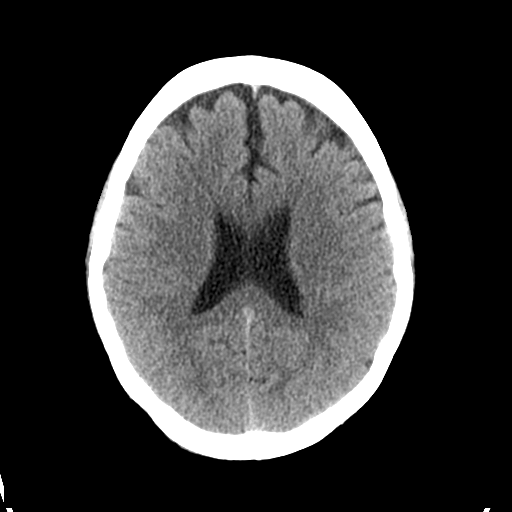
[im 20/31  brain]
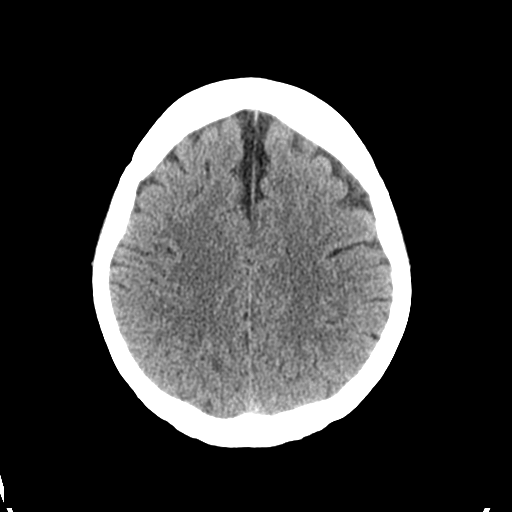
[im 23/31  brain]
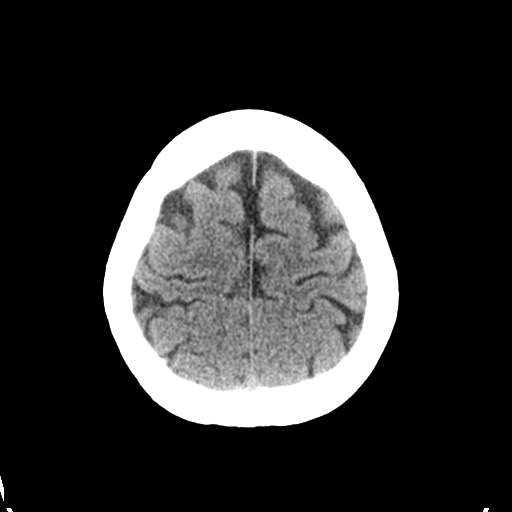
[im 25/31  brain]
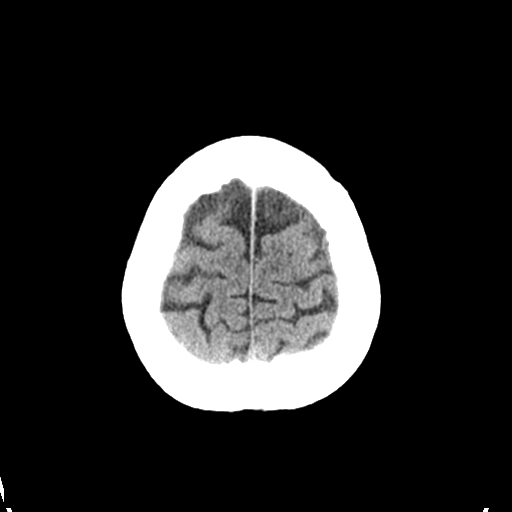
[im 25/31  bone]
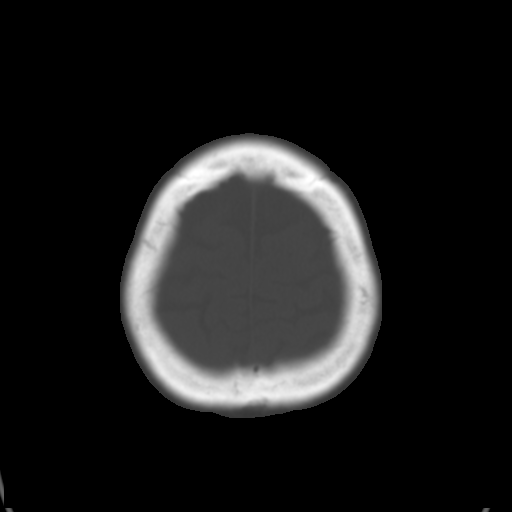
[im 28/31  brain]
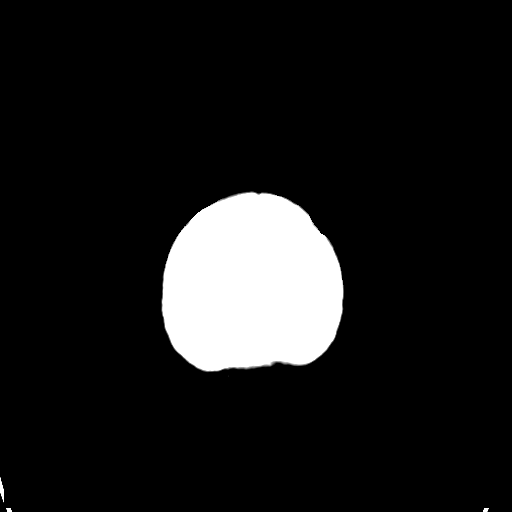

[Series 4: coronal soft · coronal · 0.32mm/px · 3 of 67 slices shown]
[im 23/67  brain]
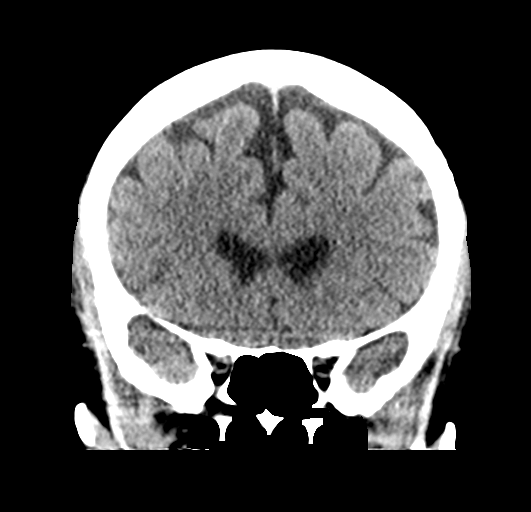
[im 30/67  brain]
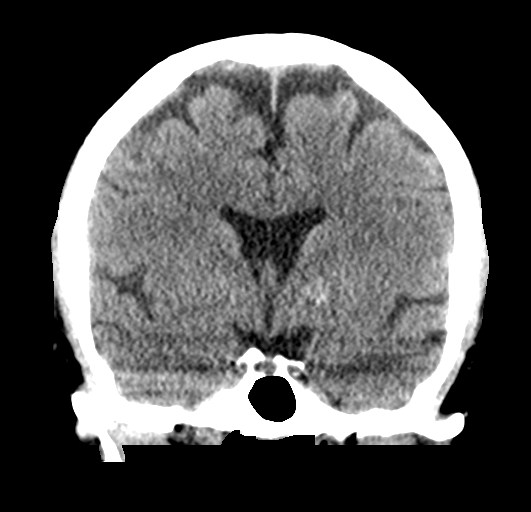
[im 37/67  brain]
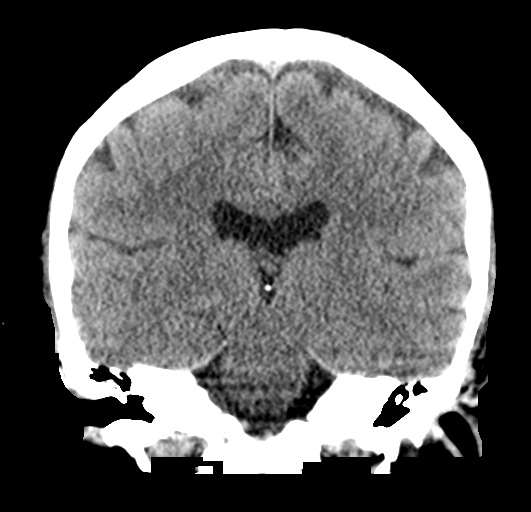

[Series 5: sagittal soft · sagittal · 0.34mm/px · 3 of 54 slices shown]
[im 18/54  brain]
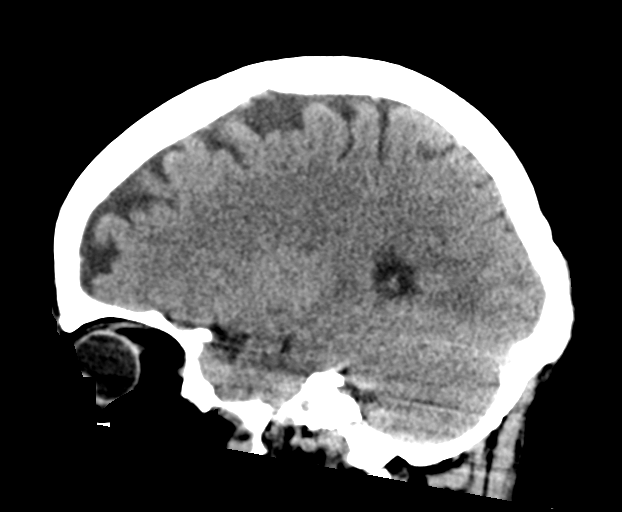
[im 27/54  brain]
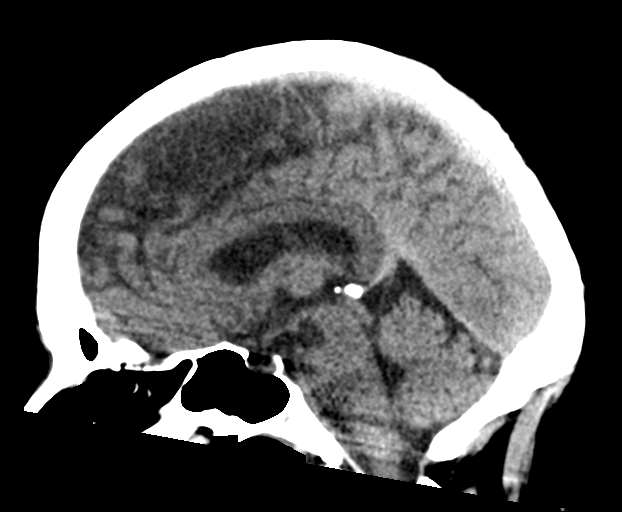
[im 36/54  brain]
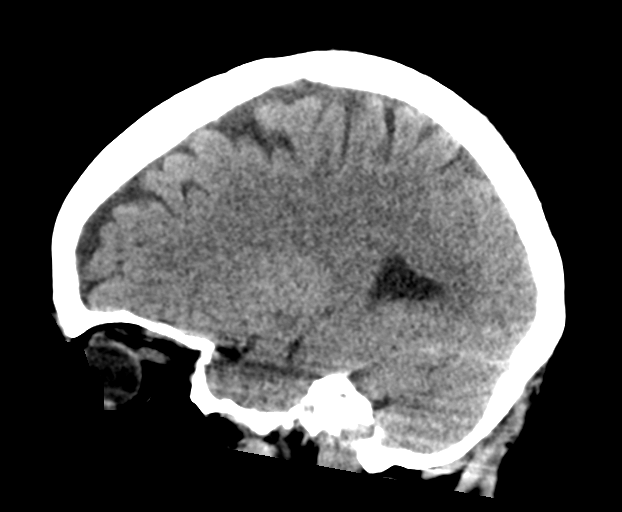

[16 of 47 positions shown; findings below may reference images not displayed]

FINDINGS: Brain: There is no evidence for acute hemorrhage, hydrocephalus,
mass lesion, or abnormal extra-axial fluid collection. No definite
CT evidence for acute infarction.

Vascular: No hyperdense vessel or unexpected calcification.

Skull: No evidence for fracture. No worrisome lytic or sclerotic
lesion.

Sinuses/Orbits: The visualized paranasal sinuses and mastoid air
cells are clear. Visualized portions of the globes and intraorbital
fat are unremarkable.

Other: None.
IMPRESSION: Unremarkable study.  No acute intracranial abnormality.

## 2019-11-18 MED ORDER — CEPHALEXIN 500 MG PO CAPS
500.0000 mg | ORAL_CAPSULE | Freq: Four times a day (QID) | ORAL | 0 refills | Status: DC
Start: 2019-11-18 — End: 2020-01-22

## 2019-11-18 NOTE — ED Triage Notes (Signed)
Unbalanced and unsteady gait started two days ago.

## 2019-11-18 NOTE — Discharge Instructions (Addendum)
You were seen in the emergency department for an unsteady gait and frequent falls.  You had blood work EKG and a CAT scan of your head that did not show any serious findings.  This may be related to your new psychiatric meds.  Please contact your psychiatrist for reevaluation.  Your urinalysis also showed signs of infection so we are prescribing you an antibiotic for that.  Return to the emergency department if any worsening or concerning symptoms.

## 2019-11-18 NOTE — ED Provider Notes (Signed)
[facility] Provider Note   CSN: [id number] Arrival date & time: 11/18/19  1540     History Chief Complaint  Patient presents with  . Tremors    Dawn Kelley is a 59 y.o. female.  She has history of psychiatric disorder.  She said she was recent released from psych facility where they changed all of her meds.  This is about 2 weeks ago.  She said she has been unsteady on her feet for the last 3 days.  Yesterday had multiple falls.  She feels like her legs are going to give out and she also feels like she is falling more toward the left.  Denies any injury from the fall.  States before this she was walking normally.  She also said today she had an episode of involuntary movements in her left arm and leg that lasted a few minutes associated with some drooling from the side of her mouth.  She said she had an episode like this once years ago.  Denies any headache blurry vision numbness focal weakness chest pain shortness of breath abdominal pain vomiting diarrhea.  She denies any suicidal ideation.  The history is provided by the patient.  Weakness Severity:  Moderate Onset quality:  Gradual Timing:  Constant Progression:  Unchanged Chronicity:  New Context: change in medication   Relieved by:  Nothing Worsened by:  Activity Ineffective treatments:  None tried Associated symptoms: ataxia, difficulty walking, falls and seizures (??)   Associated symptoms: no abdominal pain, no aphasia, no chest pain, no diarrhea, no dysuria, no numbness in extremities, no fever, no foul-smelling urine, no headaches, no nausea, no shortness of breath, no vision change and no vomiting   Risk factors: new medications        Past Medical History:  Diagnosis Date  . Anxiety   . Bipolar 1 disorder ([place])   . OCD (obsessive compulsive disorder)   . PTSD (post-traumatic stress disorder)     There are no problems to display for this patient.   Past Surgical History:  Procedure  Laterality Date  . VEIN SURGERY       OB History   No obstetric history on file.     History reviewed. No pertinent family history.  Social History   Tobacco Use  . Smoking status: Never Smoker  . Smokeless tobacco: Never Used  Substance Use Topics  . Alcohol use: Not Currently    Comment: occ.   . Drug use: Yes    Types: Marijuana    Comment: Pt denied current use    Home Medications Prior to Admission medications   Medication Sig Start Date End Date Taking? Authorizing Provider  ALPRAZolam Duanne Moron) 1 MG tablet Take 1 tablet (1 mg total) by mouth as needed for anxiety. 09/28/19   Mesner, Corene Cornea, MD  ARIPiprazole (ABILIFY) 10 MG tablet Take 10 mg by mouth daily.  08/03/19   [provider]  ergocalciferol (VITAMIN D2) 1.25 MG (50000 UT) capsule Take 50,000 Units by mouth once a week.    [provider]  FLUoxetine (PROZAC) 40 MG capsule Take 40 mg by mouth daily. 09/21/19   [provider]  loratadine (CLARITIN) 10 MG tablet Take 10 mg by mouth daily.    [provider]  Melatonin 5 MG CAPS Take 5 mg by mouth at bedtime.    [provider]    Allergies    Seroquel [quetiapine fumarate]  Review of Systems   Review of Systems  Constitutional: Negative for fever.  HENT: Negative for sore throat.   Eyes: Negative for visual disturbance.  Respiratory: Negative for shortness of breath.   Cardiovascular: Negative for chest pain.  Gastrointestinal: Negative for abdominal pain, diarrhea, nausea and vomiting.  Genitourinary: Negative for dysuria.  Musculoskeletal: Positive for falls and gait problem.  Skin: Negative for rash.  Neurological: Positive for seizures (??) and weakness. Negative for speech difficulty and headaches.    Physical Exam Updated Vital Signs BP 135/82 (BP Location: Left Arm)   Pulse 88   Temp 98.7 F (37.1 C) (Oral)   Resp 18   Ht 5\' 6"  (1.676 m)   Wt 79.4 kg   SpO2 96%   BMI 28.25 kg/m   Physical  Exam Vitals and nursing note reviewed.  Constitutional:      General: She is not in acute distress.    Appearance: She is well-developed.  HENT:     Head: Normocephalic and atraumatic.  Eyes:     Conjunctiva/sclera: Conjunctivae normal.  Cardiovascular:     Rate and Rhythm: Normal rate and regular rhythm.     Heart sounds: No murmur heard.   Pulmonary:     Effort: Pulmonary effort is normal. No respiratory distress.     Breath sounds: Normal breath sounds.  Abdominal:     Palpations: Abdomen is soft.     Tenderness: There is no abdominal tenderness.  Musculoskeletal:        General: No deformity or signs of injury. Normal range of motion.     Cervical back: Neck supple.  Skin:    General: Skin is warm and dry.     Capillary Refill: Capillary refill takes less than 2 seconds.  Neurological:     Mental Status: She is alert and oriented to person, place, and time.     Cranial Nerves: No cranial nerve deficit.     Sensory: No sensory deficit.     Motor: No weakness.     Gait: Gait abnormal.     Comments: She has no focal upper or lower extremity weakness.  She gets her self out of bed slowly and stands at the side of the bed.  She has some chronic head tremor.  When she walks she looks very unsure of her self, going slowly.  She was not grossly ataxic.     ED Results / Procedures / Treatments   Labs (all labs ordered are listed, but only abnormal results are displayed) Labs Reviewed  COMPREHENSIVE METABOLIC PANEL - Abnormal; Notable for the following components:      Result Value   Glucose, Bld 114 (*)    Calcium 8.8 (*)    All other components within normal limits  URINALYSIS, ROUTINE W REFLEX MICROSCOPIC - Abnormal; Notable for the following components:   APPearance HAZY (*)    Leukocytes,Ua LARGE (*)    All other components within normal limits  RAPID URINE DRUG SCREEN, HOSP PERFORMED - Abnormal; Notable for the following components:   Benzodiazepines POSITIVE (*)     Tetrahydrocannabinol POSITIVE (*)    All other components within normal limits  URINE CULTURE  CBC WITH DIFFERENTIAL/PLATELET    EKG EKG Interpretation  Date/Time:  Wednesday November 18 2019 19:15:21 EDT Ventricular Rate:  69 PR Interval:    QRS Duration: 79 QT Interval:  502 QTC Calculation: 538 R Axis:   19 Text Interpretation: Sinus rhythm Low voltage, precordial leads Borderline abnrm T, anterolateral leads Prolonged QT interval No significant change since prior  5/21 Confirmed by Aletta Edouard [phone]) on 11/18/2019 7:20:02 PM   Radiology CT Head Wo Contrast  Result Date: 11/18/2019 CLINICAL DATA:  Ataxia. EXAM: CT HEAD WITHOUT CONTRAST TECHNIQUE: Contiguous axial images were obtained from the base of the skull through the vertex without intravenous contrast. COMPARISON:  Insert CT angio head 04/11/2019 FINDINGS: Brain: There is no evidence for acute hemorrhage, hydrocephalus, mass lesion, or abnormal extra-axial fluid collection. No definite CT evidence for acute infarction. Vascular: No hyperdense vessel or unexpected calcification. Skull: No evidence for fracture. No worrisome lytic or sclerotic lesion. Sinuses/Orbits: The visualized paranasal sinuses and mastoid air cells are clear. Visualized portions of the globes and intraorbital fat are unremarkable. Other: None. IMPRESSION: Unremarkable study.  No acute intracranial abnormality. Electronically Signed   By: Misty Stanley M.D.   On: 11/18/2019 17:40    Procedures Procedures (including critical care time)  Medications Ordered in ED Medications - No data to display  ED Course  I have reviewed the triage vital signs and the nursing notes.  Pertinent labs & imaging results that were available during my care of the patient were reviewed by me and considered in my medical decision making (see chart for details).  Clinical Course as of Nov 18 1104  Wed Nov 18, 2019  1852 Reviewed patient's results with her.  She is happy that  there is no serious findings on her work-up.  She said she is going to get in with her psychiatrist to see if she can get her meds adjusted.  This seems to be the most likely cause of her symptoms.   [MB]    Clinical Course User Index [MB] [name], MD   MDM Rules/Calculators/A&P                         This patient complains of unsteady gait, tremor falls; this involves an extensive number of treatment Options and is a complaint that carries with it a high risk of complications and Morbidity. The differential includes stroke, seizure, medication reaction, metabolic derangement, anemia, infection  I ordered, reviewed and interpreted labs, which included CBC with normal white count normal hemoglobin, chemistries normal other than mildly elevated glucose and low calcium, urinalysis with possible signs of infection, toxicology positive for benzos which she is on and THC I ordered imaging studies which included head CT and I independently    visualized and interpreted imaging which showed no acute findings Previous records obtained and reviewed in epic, including last ED visit at [facility] for suicidal ideation and depression  After the interventions stated above, I reevaluated the patient and found patient to be hemodynamically stable. She is ambulating safely here in the department. She is comfortable being discharged to her husband and she is going to follow-up with her neurologist for possible med adjustments. Return instructions discussed   Final Clinical Impression(s) / ED Diagnoses Final diagnoses:  Unsteady gait  Lower urinary tract infection    Rx / DC Orders ED Discharge Orders    None       [name], MD 11/19/19 1110

## 2019-11-18 NOTE — ED Triage Notes (Signed)
Pt with head tremors going on for a year.  Multiple falls yesterday.  Denies hitting her head.  Pt has been in "mental rehab".   Pt believes her medications are interacting with one another.

## 2019-11-20 LAB — URINE CULTURE

## 2019-11-26 ENCOUNTER — Encounter (HOSPITAL_COMMUNITY): Payer: Self-pay

## 2019-11-26 ENCOUNTER — Emergency Department (HOSPITAL_COMMUNITY)
Admission: EM | Admit: 2019-11-26 | Discharge: 2019-11-26 | Disposition: A | Discharge status: Home / Self Care | Payer: Medicare Other | Attending: Emergency Medicine | Admitting: Emergency Medicine

## 2019-11-26 ENCOUNTER — Other Ambulatory Visit: Payer: Self-pay

## 2019-11-26 DIAGNOSIS — F332 Major depressive disorder, recurrent severe without psychotic features: Secondary | ICD-10-CM | POA: Diagnosis not present

## 2019-11-26 DIAGNOSIS — R251 Tremor, unspecified: Secondary | ICD-10-CM | POA: Insufficient documentation

## 2019-11-26 DIAGNOSIS — Z79899 Other long term (current) drug therapy: Secondary | ICD-10-CM | POA: Diagnosis not present

## 2019-11-26 DIAGNOSIS — F419 Anxiety disorder, unspecified: Secondary | ICD-10-CM | POA: Diagnosis present

## 2019-11-26 DIAGNOSIS — F4310 Post-traumatic stress disorder, unspecified: Secondary | ICD-10-CM | POA: Insufficient documentation

## 2019-11-26 DIAGNOSIS — Z7982 Long term (current) use of aspirin: Secondary | ICD-10-CM | POA: Insufficient documentation

## 2019-11-26 DIAGNOSIS — F32A Depression, unspecified: Secondary | ICD-10-CM

## 2019-11-26 LAB — COMPREHENSIVE METABOLIC PANEL
ALT: 23 U/L (ref 0–44)
AST: 19 U/L (ref 15–41)
Albumin: 4 g/dL (ref 3.5–5.0)
Alkaline Phosphatase: 86 U/L (ref 38–126)
Anion gap: 10 (ref 5–15)
BUN: 9 mg/dL (ref 6–20)
CO2: 27 mmol/L (ref 22–32)
Calcium: 9.2 mg/dL (ref 8.9–10.3)
Chloride: 102 mmol/L (ref 98–111)
Creatinine, Ser: 0.69 mg/dL (ref 0.44–1.00)
GFR calc Af Amer: >60 mL/min (ref >60)
GFR calc non Af Amer: >60 mL/min (ref >60)
Glucose, Bld: 122 mg/dL — ABNORMAL HIGH (ref 70–99)
Potassium: 4 mmol/L (ref 3.5–5.1)
Sodium: 139 mmol/L (ref 135–145)
Total Bilirubin: 0.4 mg/dL (ref 0.3–1.2)
Total Protein: 7.8 g/dL (ref 6.5–8.1)

## 2019-11-26 LAB — URINALYSIS, ROUTINE W REFLEX MICROSCOPIC
Bilirubin Urine: NEGATIVE
Glucose, UA: NEGATIVE mg/dL
Hgb urine dipstick: NEGATIVE
Ketones, ur: NEGATIVE mg/dL
Leukocytes,Ua: NEGATIVE
Nitrite: NEGATIVE
Protein, ur: NEGATIVE mg/dL
Specific Gravity, Urine: 1.012 (ref 1.005–1.030)
pH: 7 (ref 5.0–8.0)

## 2019-11-26 LAB — CBC
HCT: 43.3 % (ref 36.0–46.0)
Hemoglobin: 14.2 g/dL (ref 12.0–15.0)
MCH: 29.4 pg (ref 26.0–34.0)
MCHC: 32.8 g/dL (ref 30.0–36.0)
MCV: 89.6 fL (ref 80.0–100.0)
Platelets: 394 10*3/uL (ref 150–400)
RBC: 4.83 MIL/uL (ref 3.87–5.11)
RDW: 12.7 % (ref 11.5–15.5)
WBC: 10.6 10*3/uL — ABNORMAL HIGH (ref 4.0–10.5)
nRBC: 0 % (ref 0.0–0.2)

## 2019-11-26 LAB — RAPID URINE DRUG SCREEN, HOSP PERFORMED
Amphetamines: NOT DETECTED
Barbiturates: NOT DETECTED
Benzodiazepines: POSITIVE — AB
Cocaine: NOT DETECTED
Opiates: NOT DETECTED
Tetrahydrocannabinol: POSITIVE — AB

## 2019-11-26 LAB — SALICYLATE LEVEL: Salicylate Lvl: <7 mg/dL — ABNORMAL LOW (ref 7.0–30.0)

## 2019-11-26 LAB — ACETAMINOPHEN LEVEL: Acetaminophen (Tylenol), Serum: <10 ug/mL — ABNORMAL LOW (ref 10–30)

## 2019-11-26 LAB — ETHANOL: Alcohol, Ethyl (B): <10 mg/dL (ref <10)

## 2019-11-26 MED ORDER — LORAZEPAM 2 MG/ML IJ SOLN
2.0000 mg | Freq: Once | INTRAMUSCULAR | Status: AC
  Administered 2019-11-26: 2 mg via INTRAMUSCULAR
  Filled 2019-11-26: qty 1

## 2019-11-26 MED ORDER — LORAZEPAM 1 MG PO TABS: 2.0000 mg | ORAL_TABLET | Freq: Three times a day (TID) | ORAL | Status: DC | PRN

## 2019-11-26 MED ORDER — LORAZEPAM 1 MG PO TABS
2.0000 mg | ORAL_TABLET | Freq: Once | ORAL | Status: AC
  Administered 2019-11-26: 2 mg via ORAL
  Filled 2019-11-26: qty 2

## 2019-11-26 MED ORDER — ASPIRIN EC 81 MG PO TBEC: 81.0000 mg | DELAYED_RELEASE_TABLET | Freq: Every day | ORAL | Status: DC

## 2019-11-26 NOTE — ED Notes (Signed)
Pt hysterically crying and inconsolable. Orders received from EDP.

## 2019-11-26 NOTE — ED Notes (Signed)
TTS in progress 

## 2019-11-26 NOTE — ED Provider Notes (Signed)
[facility] EMERGENCY DEPARTMENT Provider Note   CSN: [id number] Arrival date & time: 11/26/19  2440     History Chief Complaint  Patient presents with  . V70.1  . Anxiety    Dawn Kelley is a 59 y.o. female with a history significant for PTSD, bipolar disorder, anxiety and OCD who has undergone recent hospitalization for her mental health issues after her longstanding citalopram stopped working several months ago.  She presents today with complaints of persistent severe anxiety along with severe depression but denies suicidal or homicidal ideation.  She was placed on 5 new medications when she was hospitalized including lamotrigine, gabapentin, duloxetine, Latuda and lorazepam.  Since being on these medications decisions she has not noticed any improvement in her mood, anxiety or depression.  She has become very tremulous, in fact was here on July 7 for gait problems felt secondary to these medications.  She saw her mental health provider Dr. Abner Greenspan who decreased her medications in half, perhaps has noticed a slight improvement in the tremor, but again no improvement in her depression and anxiety.  She is requesting reevaluation by mental health at this time.  She contacted her psychiatrist who recommended evaluation here.  HPI     Past Medical History:  Diagnosis Date  . Anxiety   . Bipolar 1 disorder ([place])   . OCD (obsessive compulsive disorder)   . PTSD (post-traumatic stress disorder)     There are no problems to display for this patient.   Past Surgical History:  Procedure Laterality Date  . VEIN SURGERY       OB History   No obstetric history on file.     No family history on file.  Social History   Tobacco Use  . Smoking status: Never Smoker  . Smokeless tobacco: Never Used  Substance Use Topics  . Alcohol use: Not Currently    Comment: occ.   . Drug use: Yes    Types: Marijuana    Home Medications Prior to Admission medications   Medication Sig  Start Date End Date Taking? Authorizing Provider  aspirin EC 81 MG tablet Take 81 mg by mouth daily. Swallow whole.   Yes [provider]  DULoxetine (CYMBALTA) 30 MG capsule Take 30 mg by mouth daily. 11/13/19  Yes [provider]  gabapentin (NEURONTIN) 600 MG tablet Take 600 mg by mouth 3 (three) times daily. 11/13/19  Yes [provider]  lamoTRIgine (LAMICTAL) 25 MG tablet Take 25 mg by mouth daily. 11/13/19  Yes [provider]  LATUDA 80 MG TABS tablet Take 40 mg by mouth in the morning and at bedtime. 11/13/19  Yes [provider]  LORazepam (ATIVAN) 2 MG tablet Take 2 mg by mouth in the morning, at noon, and at bedtime.   Yes [provider]  Multiple Vitamins-Minerals (WOMENS MULTI VITAMIN & MINERAL PO) Take 1 tablet by mouth daily.   Yes [provider]  Vitamin D, Ergocalciferol, (DRISDOL) 1.25 MG (50000 UNIT) CAPS capsule Take 50,000 Units by mouth every Sunday.   Yes [provider]  cephALEXin (KEFLEX) 500 MG capsule Take 1 capsule (500 mg total) by mouth 4 (four) times daily. Patient not taking: Reported on 11/26/2019 11/18/19   [name], MD    Allergies    Seroquel [quetiapine fumarate]  Review of Systems   Review of Systems  Constitutional: Negative for chills and fever.  HENT: Negative for congestion and sore throat.   Eyes: Negative.   Respiratory:  Negative for chest tightness and shortness of breath.   Cardiovascular: Negative for chest pain.  Gastrointestinal: Negative for abdominal pain and nausea.  Genitourinary: Negative.   Musculoskeletal: Negative for arthralgias, joint swelling and neck pain.  Skin: Negative.  Negative for rash and wound.  Neurological: Positive for tremors. Negative for dizziness, seizures, weakness, light-headedness, numbness and headaches.  Psychiatric/Behavioral: Positive for decreased concentration and dysphoric mood. Negative for confusion, hallucinations, self-injury  and suicidal ideas. The patient is nervous/anxious.     Physical Exam Updated Vital Signs BP (!) 140/97 (BP Location: Right Arm)   Pulse 87   Temp 97.7 F (36.5 C) (Oral)   Resp 18   Ht 5\' 5"  (1.651 m)   Wt 81.6 kg   SpO2 98%   BMI 29.95 kg/m   Physical Exam Vitals and nursing note reviewed.  Constitutional:      Appearance: Normal appearance. She is well-developed.  HENT:     Head: Normocephalic and atraumatic.     Mouth/Throat:     Mouth: Mucous membranes are moist.  Eyes:     Conjunctiva/sclera: Conjunctivae normal.  Cardiovascular:     Rate and Rhythm: Normal rate and regular rhythm.     Heart sounds: Normal heart sounds.  Pulmonary:     Effort: Pulmonary effort is normal.     Breath sounds: Normal breath sounds. No wheezing.  Abdominal:     General: Bowel sounds are normal. There is no distension.     Palpations: Abdomen is soft.     Tenderness: There is no abdominal tenderness.  Musculoskeletal:        General: Normal range of motion.     Cervical back: Normal range of motion.  Skin:    General: Skin is warm and dry.     Comments: Old healing bruises anterior upper arms (sustained in a fall prior to her last ED visit).  Neurological:     Mental Status: She is alert.     Cranial Nerves: Cranial nerves are intact.     Motor: Tremor present. No weakness or pronator drift.     Coordination: Coordination normal. Heel to Shin Test normal. Rapid alternating movements normal.     Gait: Gait is intact.     Deep Tendon Reflexes:     Reflex Scores:      Bicep reflexes are 3+ on the right side and 3+ on the left side.      Patellar reflexes are 3+ on the right side and 3+ on the left side.    Comments: Hyperreflexive but symmetric.     ED Results / Procedures / Treatments   Labs (all labs ordered are listed, but only abnormal results are displayed) Labs Reviewed  COMPREHENSIVE METABOLIC PANEL - Abnormal; Notable for the following components:      Result Value    Glucose, Bld 122 (*)    All other components within normal limits  SALICYLATE LEVEL - Abnormal; Notable for the following components:   Salicylate Lvl <0.8 (*)    All other components within normal limits  ACETAMINOPHEN LEVEL - Abnormal; Notable for the following components:   Acetaminophen (Tylenol), Serum <10 (*)    All other components within normal limits  CBC - Abnormal; Notable for the following components:   WBC 10.6 (*)    All other components within normal limits  RAPID URINE DRUG SCREEN, HOSP PERFORMED - Abnormal; Notable for the following components:   Benzodiazepines POSITIVE (*)    Tetrahydrocannabinol POSITIVE (*)  All other components within normal limits  URINALYSIS, ROUTINE W REFLEX MICROSCOPIC - Abnormal; Notable for the following components:   APPearance HAZY (*)    All other components within normal limits  ETHANOL    EKG None  Radiology No results found.  Procedures Procedures (including critical care time)  Medications Ordered in ED Medications  LORazepam (ATIVAN) tablet 2 mg (2 mg Oral Given 11/26/19 1402)  LORazepam (ATIVAN) injection 2 mg (2 mg Intramuscular Given 11/26/19 1517)    ED Course  I have reviewed the triage vital signs and the nursing notes.  Pertinent labs & imaging results that were available during my care of the patient were reviewed by me and considered in my medical decision making (see chart for details).    MDM Rules/Calculators/A&P                          Patient with no improvement in her depression and anxiety despite psychiatric admission and multiple new psych medications, now with a generalized tremor, possibly improving since her dosages were cut in 1/2 5 days ago.  She has no suicidal ideation, but is profoundly depressed.  She would benefit from TTS evaluation pending medical clearance.  Patient is here voluntarily.  Pt has been medically cleared pending TTS eval at this time.   Final Clinical Impression(s) / ED  Diagnoses Final diagnoses:  Anxiety  Depression, unspecified depression type    Rx / DC Orders ED Discharge Orders    None       Landis Martins 11/26/19 1843    [name], MD 11/26/19 [phone]

## 2019-11-26 NOTE — ED Triage Notes (Signed)
Pt states she is going to have a nervous breakdown and really needs help. Pt states "Please don't send me home, if I have to say I'm suicidal to stay then that's what it boils down too." I really need help and I don't want to be sent home. Pt very tearful in triage.

## 2019-11-26 NOTE — Discharge Instructions (Signed)
Been cleared by behavioral health for discharge home.  Follow-up as per behavioral health.

## 2019-11-26 NOTE — ED Notes (Signed)
Pt husband notified 
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
## 2019-11-26 NOTE — ED Provider Notes (Signed)
Patient cleared by behavioral health for discharge home.  Follow-up as per behavioral health.   Fredia Sorrow, MD 11/26/19 2043

## 2019-11-26 NOTE — ED Notes (Signed)
Pt is psych cleared and follow up with intensive outpatient.

## 2019-11-27 ENCOUNTER — Telehealth (HOSPITAL_COMMUNITY): Payer: Self-pay | Admitting: Psychiatry

## 2019-11-27 NOTE — Telephone Encounter (Signed)
D:  Dawn Kelley (TTS) referred pt to [facility]-IOP.  A:  Placed call to orient pt and provide her with a start date.  Pt declining at this time, but states she will call at a later date.  "We are in the middle of my husband getting dentures so the timing isn't good right now."  Encouraged pt to call case manager whenever she is ready to start [facility].  R:  Pt receptive.

## 2019-12-08 ENCOUNTER — Other Ambulatory Visit (HOSPITAL_COMMUNITY): Payer: Self-pay | Admitting: Neurology

## 2019-12-08 ENCOUNTER — Other Ambulatory Visit: Payer: Self-pay | Admitting: Neurology

## 2019-12-08 DIAGNOSIS — R296 Repeated falls: Secondary | ICD-10-CM

## 2019-12-09 ENCOUNTER — Other Ambulatory Visit: Payer: Self-pay

## 2019-12-09 ENCOUNTER — Emergency Department (HOSPITAL_COMMUNITY)
Admission: EM | Admit: 2019-12-09 | Discharge: 2019-12-09 | Discharge status: Against Medical Advice | Payer: Medicare Other

## 2019-12-30 ENCOUNTER — Other Ambulatory Visit: Payer: Self-pay

## 2019-12-30 ENCOUNTER — Ambulatory Visit (HOSPITAL_COMMUNITY)
Admission: RE | Admit: 2019-12-30 | Discharge: 2019-12-30 | Disposition: A | Discharge status: Home / Self Care | Payer: Medicare Other | Source: Ambulatory Visit | Attending: Neurology | Admitting: Neurology

## 2019-12-30 DIAGNOSIS — R296 Repeated falls: Secondary | ICD-10-CM | POA: Insufficient documentation

## 2019-12-30 IMAGING — MR MR HEAD W/O CM
12 series · 48 of 48 positions shown · non-contrast
Comparison: [DATE] head CT.

CLINICAL DATA: Tremor, headache

EXAM:
MRI HEAD WITHOUT CONTRAST
TECHNIQUE: Multiplanar, multiecho pulse sequences of the brain and surrounding
structures were obtained without intravenous contrast.

[Series 5: DWI · axial · 4.0mm · 0.88mm/px · z∈[-83,+84]mm · 3 of 43 slices shown (1 of 6)]
[im 1/43]
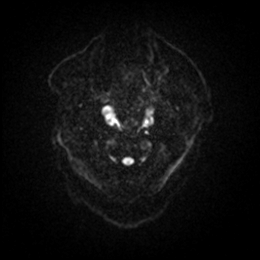
[im 22/43]
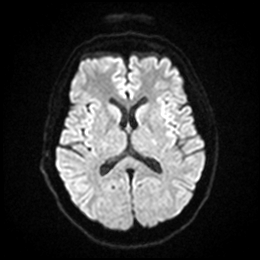
[im 43/43]
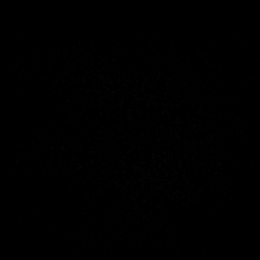

[Series 5: DWI · axial · 4.0mm · 0.88mm/px · z∈[-83,+80]mm · 3 of 42 slices shown (2 of 6)]
[im 1/42]
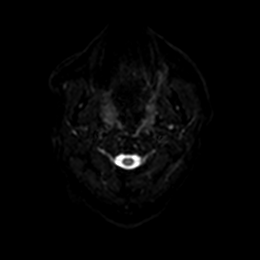
[im 21/42]
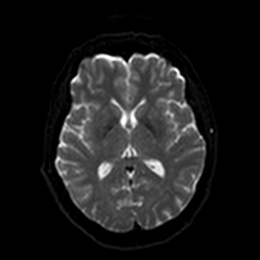
[im 42/42]
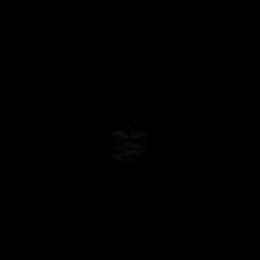

[Series 6: DWI · axial · 4.0mm · 0.88mm/px · z∈[-83,+80]mm · 4 of 42 slices shown (3 of 6)]
[im 1/42]
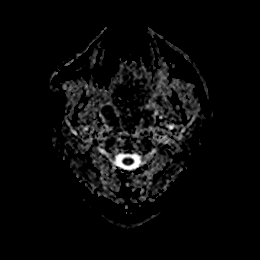
[im 14/42]
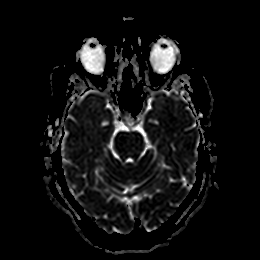
[im 28/42]
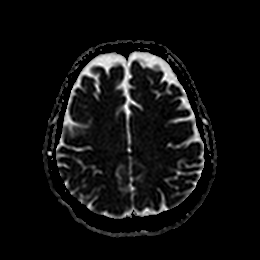
[im 42/42]
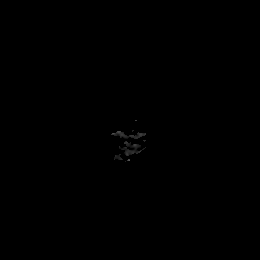

[Series 7: DWI · coronal · 4.0mm · 0.88mm/px · 3 of 32 slices shown (4 of 6)]
[im 1/32]
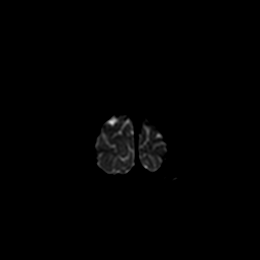
[im 16/32]
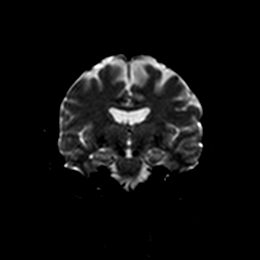
[im 32/32]
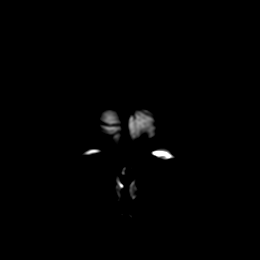

[Series 7: DWI · coronal · 4.0mm · 0.88mm/px · 3 of 32 slices shown (5 of 6)]
[im 1/32]
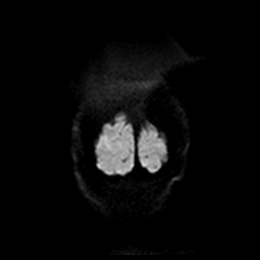
[im 16/32]
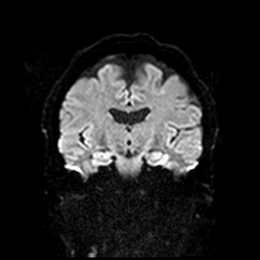
[im 32/32]
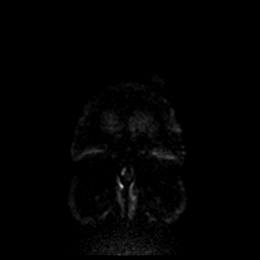

[Series 8: DWI · coronal · 4.0mm · 0.88mm/px · 3 of 32 slices shown (6 of 6)]
[im 1/32]
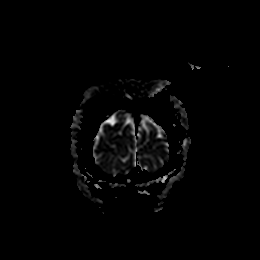
[im 16/32]
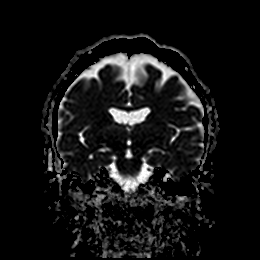
[im 32/32]
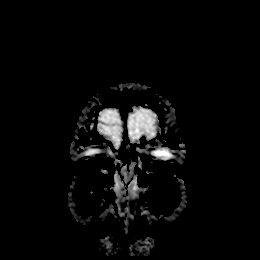

[Series 9: T1 · sagittal · 5.0mm · 0.94mm/px · 2 of 21 slices shown (1 of 2)]
[im 1/21]
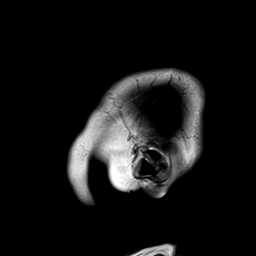
[im 21/21]
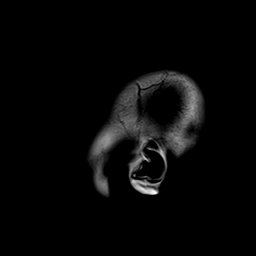

[Series 10: T2 · axial · 5.0mm · 0.87mm/px · z∈[-70,+70]mm · 2 of 21 slices shown (1 of 2)]
[im 1/21]
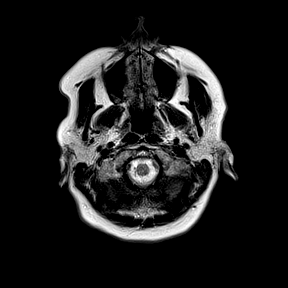
[im 21/21]
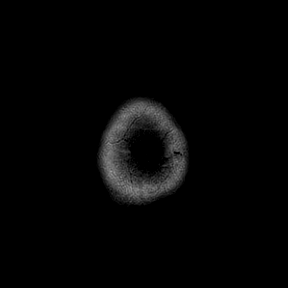

[Series 11: ax hemo · axial · 4.0mm · 0.86mm/px · z∈[-65,+69]mm · 3 of 28 slices shown]
[im 1/28]
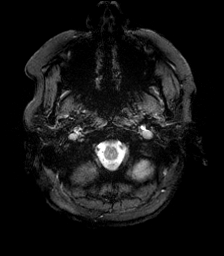
[im 14/28]
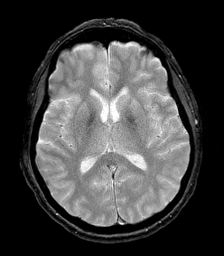
[im 28/28]
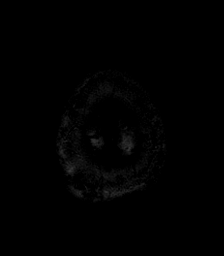

[Series 12: FLAIR · axial · 3.0mm · 0.43mm/px · z∈[-67,+71]mm · 4 of 47 slices shown]
[im 1/47]
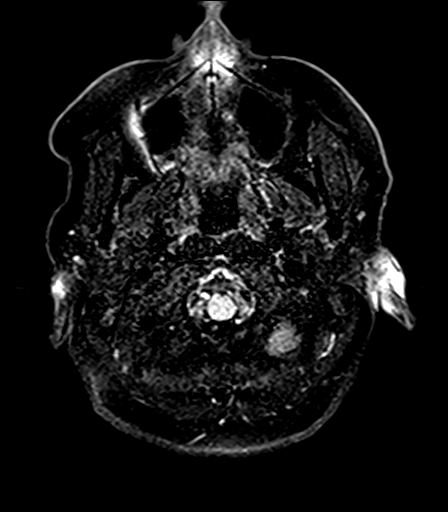
[im 16/47]
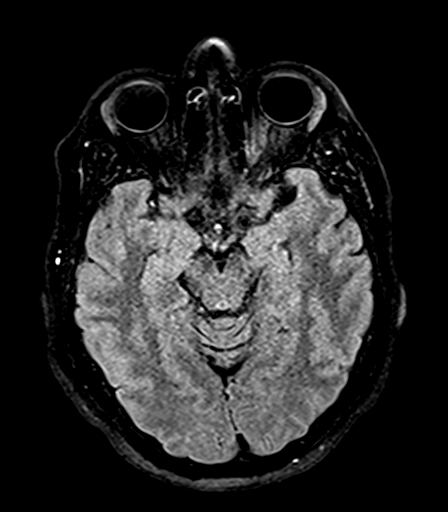
[im 31/47]
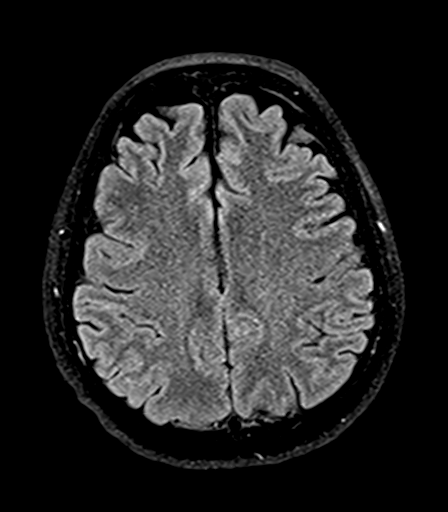
[im 47/47]
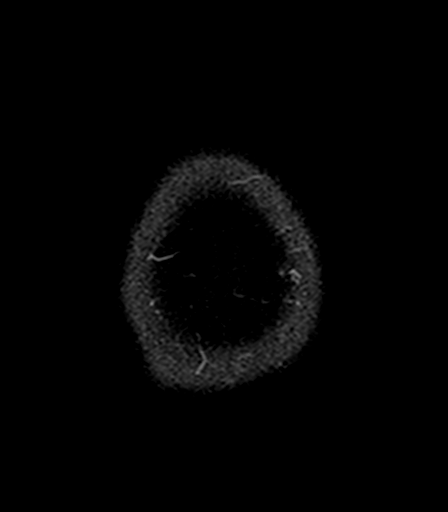

[Series 13: T1 · axial · 1.0mm · 0.98mm/px · z∈[-81,+77]mm · 15 of 160 slices shown (2 of 2)]
[im 1/160]
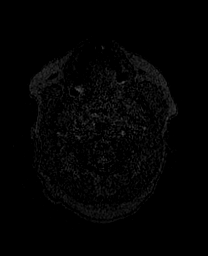
[im 12/160]
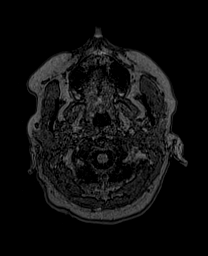
[im 23/160]
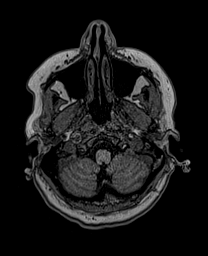
[im 35/160]
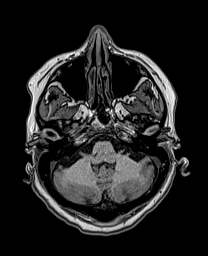
[im 46/160]
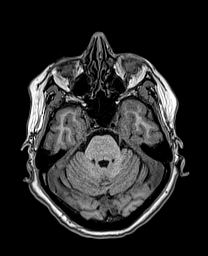
[im 57/160]
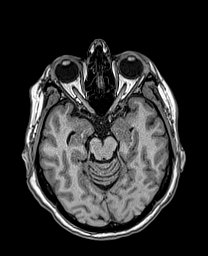
[im 69/160]
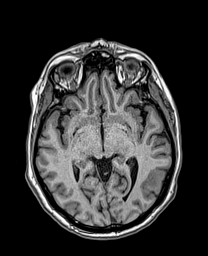
[im 80/160]
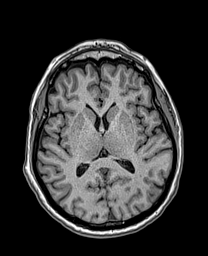
[im 91/160]
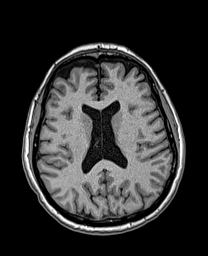
[im 103/160]
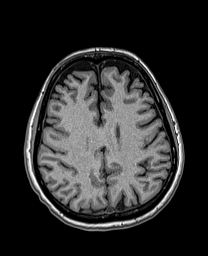
[im 114/160]
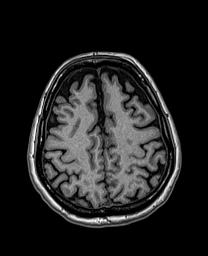
[im 125/160]
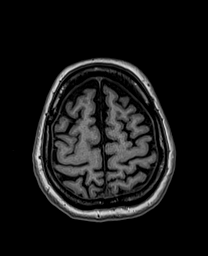
[im 137/160]
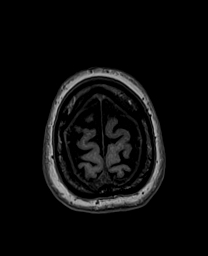
[im 148/160]
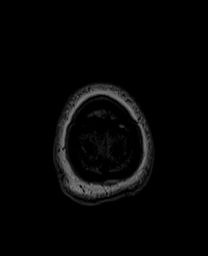
[im 160/160]
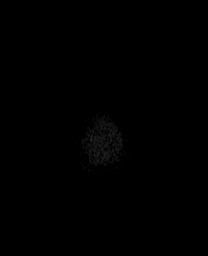

[Series 14: T2 · coronal · 5.0mm · 0.45mm/px · 3 of 28 slices shown (2 of 2)]
[im 1/28]
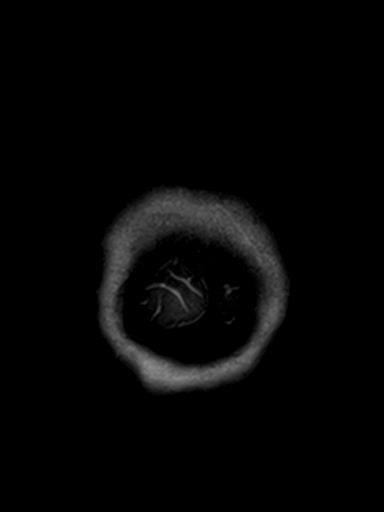
[im 14/28]
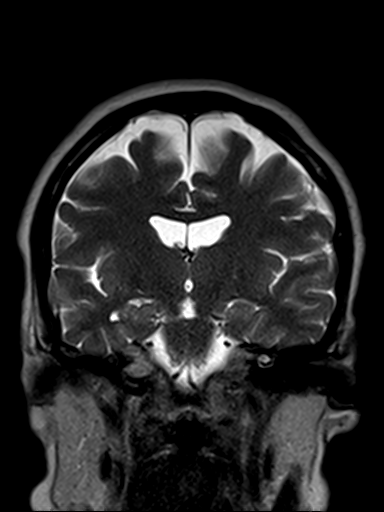
[im 28/28]
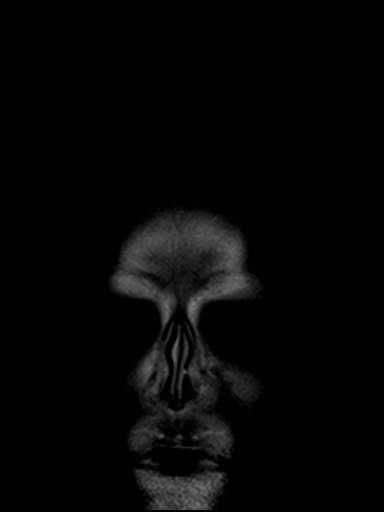

[48 of 48 positions shown; findings below may reference images not displayed]

FINDINGS: Brain: No focal parenchymal signal abnormality. No acute infarct or
intracranial hemorrhage. No midline shift, ventriculomegaly or
extra-axial fluid collection. No mass lesion. Cerebral volume is
within normal limits.

Vascular: Normal flow voids.

Skull and upper cervical spine: Normal marrow signal.

Sinuses/Orbits: Normal orbits. Clear paranasal sinuses. No mastoid
effusion.

Other: None.
IMPRESSION: Normal MRI brain.

## 2020-01-12 ENCOUNTER — Emergency Department (HOSPITAL_COMMUNITY): Payer: Medicare Other

## 2020-01-12 ENCOUNTER — Other Ambulatory Visit: Payer: Self-pay

## 2020-01-12 ENCOUNTER — Encounter (HOSPITAL_COMMUNITY): Payer: Self-pay

## 2020-01-12 ENCOUNTER — Emergency Department (HOSPITAL_COMMUNITY)
Admission: EM | Admit: 2020-01-12 | Discharge: 2020-01-12 | Disposition: A | Discharge status: Home / Self Care | Payer: Medicare Other | Attending: Emergency Medicine | Admitting: Emergency Medicine

## 2020-01-12 DIAGNOSIS — R112 Nausea with vomiting, unspecified: Secondary | ICD-10-CM | POA: Diagnosis not present

## 2020-01-12 DIAGNOSIS — F429 Obsessive-compulsive disorder, unspecified: Secondary | ICD-10-CM | POA: Insufficient documentation

## 2020-01-12 DIAGNOSIS — F419 Anxiety disorder, unspecified: Secondary | ICD-10-CM | POA: Insufficient documentation

## 2020-01-12 DIAGNOSIS — I700 Atherosclerosis of aorta: Secondary | ICD-10-CM | POA: Diagnosis not present

## 2020-01-12 DIAGNOSIS — Z7982 Long term (current) use of aspirin: Secondary | ICD-10-CM | POA: Insufficient documentation

## 2020-01-12 DIAGNOSIS — R109 Unspecified abdominal pain: Secondary | ICD-10-CM | POA: Insufficient documentation

## 2020-01-12 LAB — CBC WITH DIFFERENTIAL/PLATELET
Abs Immature Granulocytes: 0.01 10*3/uL (ref 0.00–0.07)
Basophils Absolute: 0 10*3/uL (ref 0.0–0.1)
Basophils Relative: 0 %
Eosinophils Absolute: 0 10*3/uL (ref 0.0–0.5)
Eosinophils Relative: 1 %
HCT: 42.4 % (ref 36.0–46.0)
Hemoglobin: 14.1 g/dL (ref 12.0–15.0)
Immature Granulocytes: 0 %
Lymphocytes Relative: 32 %
Lymphs Abs: 2.2 10*3/uL (ref 0.7–4.0)
MCH: 29.6 pg (ref 26.0–34.0)
MCHC: 33.3 g/dL (ref 30.0–36.0)
MCV: 89.1 fL (ref 80.0–100.0)
Monocytes Absolute: 0.3 10*3/uL (ref 0.1–1.0)
Monocytes Relative: 5 %
Neutro Abs: 4.4 10*3/uL (ref 1.7–7.7)
Neutrophils Relative %: 62 %
Platelets: 327 10*3/uL (ref 150–400)
RBC: 4.76 MIL/uL (ref 3.87–5.11)
RDW: 13.2 % (ref 11.5–15.5)
WBC: 7 10*3/uL (ref 4.0–10.5)
nRBC: 0 % (ref 0.0–0.2)

## 2020-01-12 LAB — COMPREHENSIVE METABOLIC PANEL
ALT: 22 U/L (ref 0–44)
AST: 19 U/L (ref 15–41)
Albumin: 4.2 g/dL (ref 3.5–5.0)
Alkaline Phosphatase: 83 U/L (ref 38–126)
Anion gap: 11 (ref 5–15)
BUN: 11 mg/dL (ref 6–20)
CO2: 25 mmol/L (ref 22–32)
Calcium: 9.5 mg/dL (ref 8.9–10.3)
Chloride: 102 mmol/L (ref 98–111)
Creatinine, Ser: 0.58 mg/dL (ref 0.44–1.00)
GFR calc Af Amer: >60 mL/min (ref >60)
GFR calc non Af Amer: >60 mL/min (ref >60)
Glucose, Bld: 103 mg/dL — ABNORMAL HIGH (ref 70–99)
Potassium: 3.9 mmol/L (ref 3.5–5.1)
Sodium: 138 mmol/L (ref 135–145)
Total Bilirubin: 0.6 mg/dL (ref 0.3–1.2)
Total Protein: 8.1 g/dL (ref 6.5–8.1)

## 2020-01-12 LAB — URINALYSIS, ROUTINE W REFLEX MICROSCOPIC
Bilirubin Urine: NEGATIVE
Glucose, UA: NEGATIVE mg/dL
Hgb urine dipstick: NEGATIVE
Ketones, ur: NEGATIVE mg/dL
Nitrite: NEGATIVE
Protein, ur: NEGATIVE mg/dL
Specific Gravity, Urine: 1.013 (ref 1.005–1.030)
pH: 7 (ref 5.0–8.0)

## 2020-01-12 LAB — LIPASE, BLOOD: Lipase: 47 U/L (ref 11–51)

## 2020-01-12 IMAGING — CT CT RENAL STONE PROTOCOL
2 of 4 series · 16 of 46 positions shown, 18 images · non-contrast
Comparison: None

CLINICAL DATA: LEFT flank pain, nausea and vomiting for 1 month,
loss of appetite for 2-3 months

EXAM:
CT ABDOMEN AND PELVIS WITHOUT CONTRAST
TECHNIQUE: Multidetector CT imaging of the abdomen and pelvis was performed
following the standard protocol without IV contrast. Sagittal and
coronal MPR images reconstructed from axial data set. No oral
contrast administered.

[Series 2: axial st · axial · 0.83mm/px · z∈[+1078,+1483]mm · 13 of 93 slices shown, 15 images]
[im 6/93  soft-tissue]
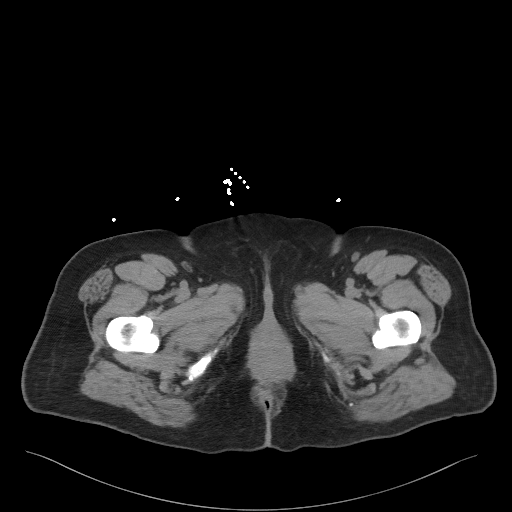
[im 6/93  bone]
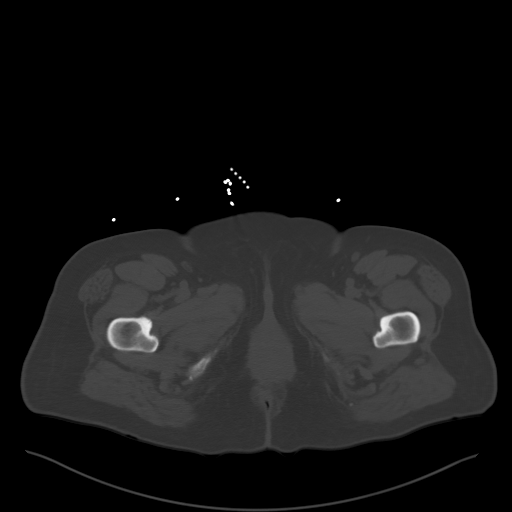
[im 11/93  soft-tissue]
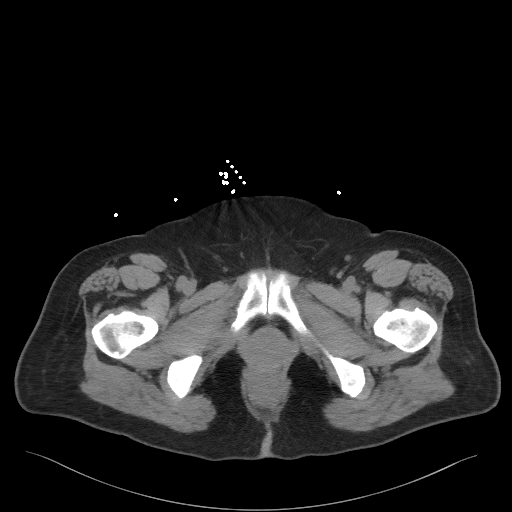
[im 22/93  soft-tissue]
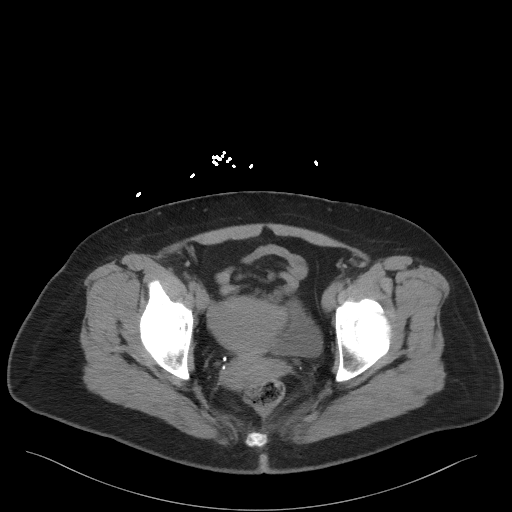
[im 28/93  soft-tissue]
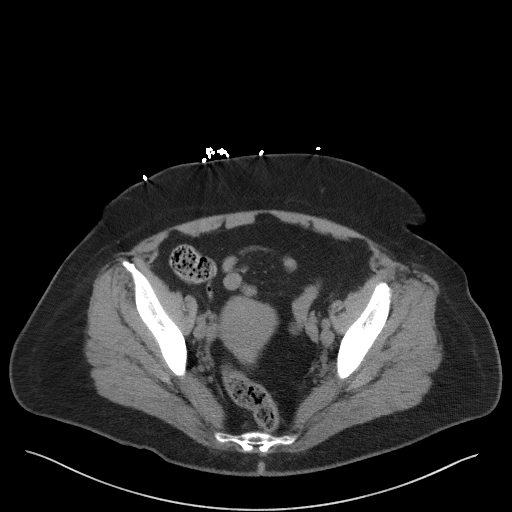
[im 33/93  soft-tissue]
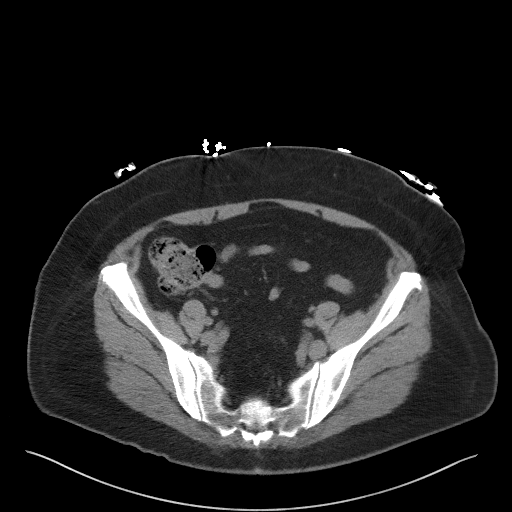
[im 38/93  soft-tissue]
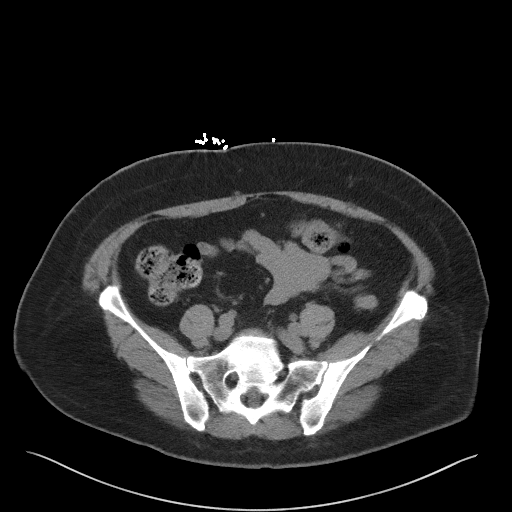
[im 49/93  soft-tissue]
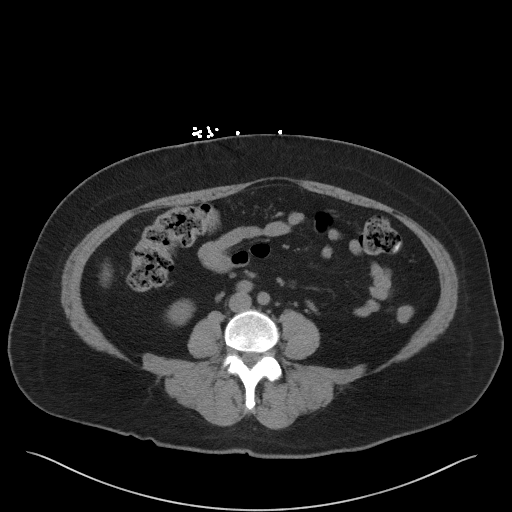
[im 55/93  soft-tissue]
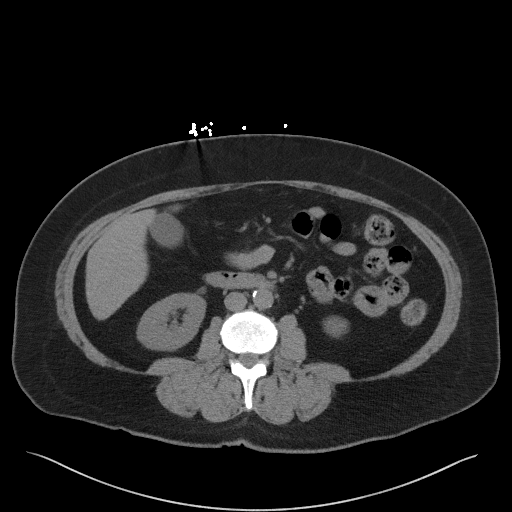
[im 60/93  soft-tissue]
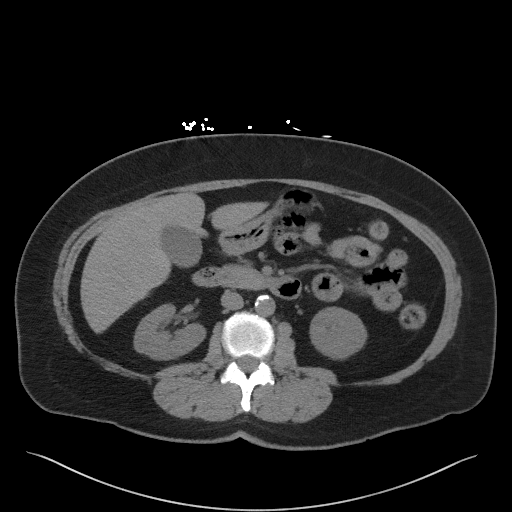
[im 60/93  bone]
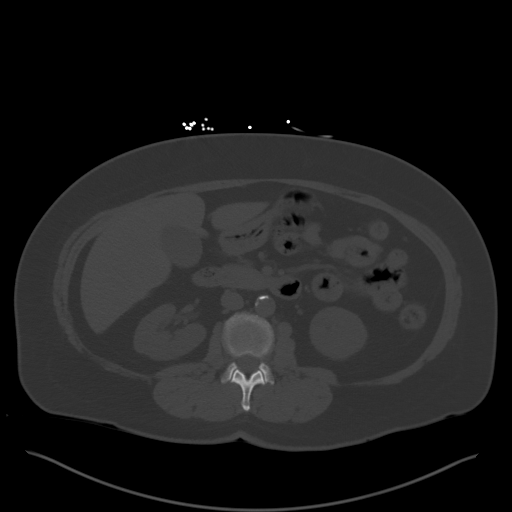
[im 65/93  soft-tissue]
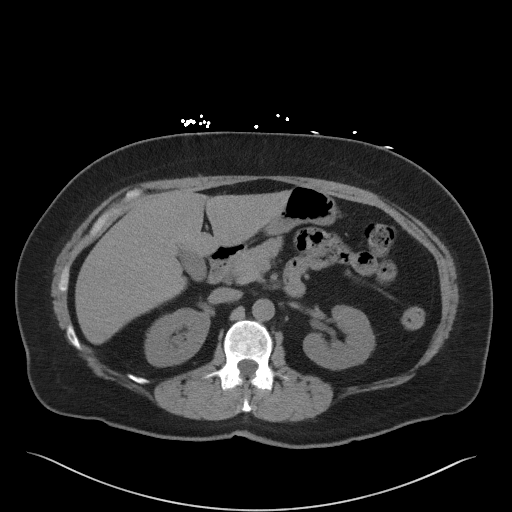
[im 71/93  soft-tissue]
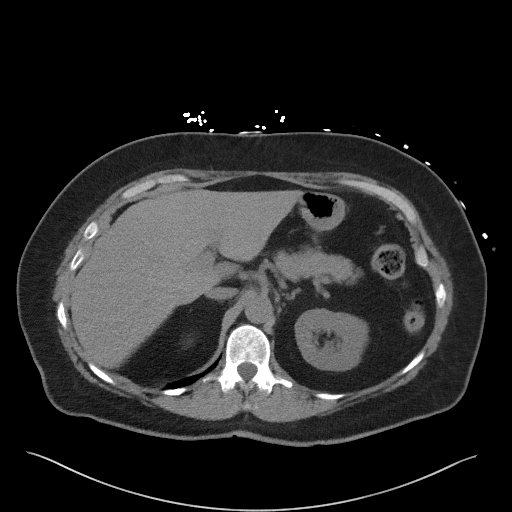
[im 82/93  soft-tissue]
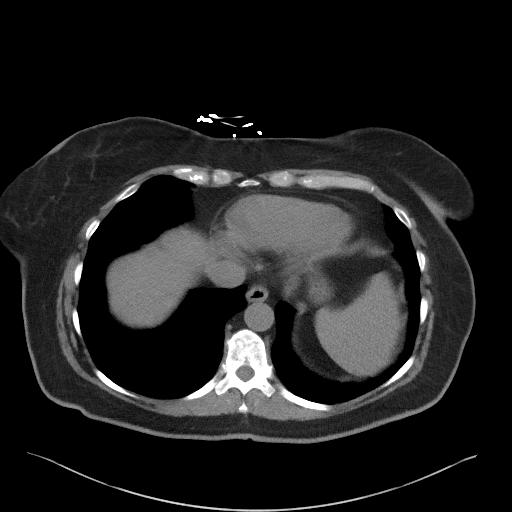
[im 87/93  soft-tissue]
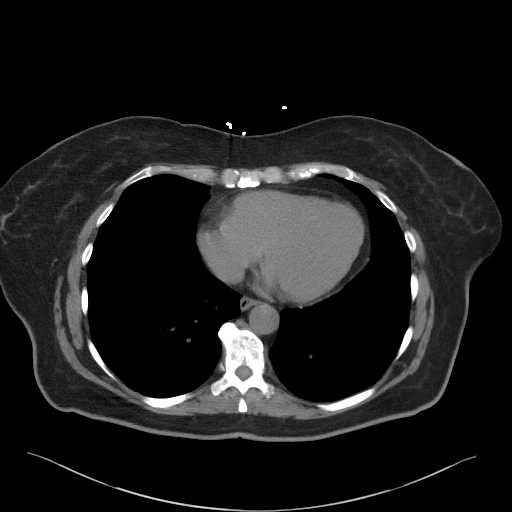

[Series 5: coronal st · coronal · 0.78mm/px · 3 of 101 slices shown]
[im 34/101  soft-tissue]
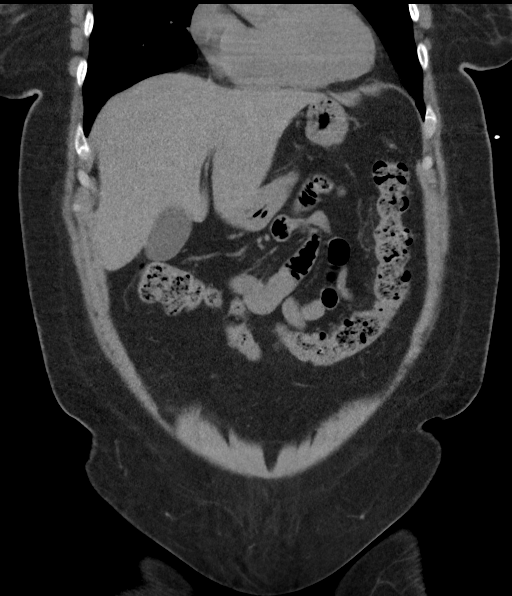
[im 45/101  soft-tissue]
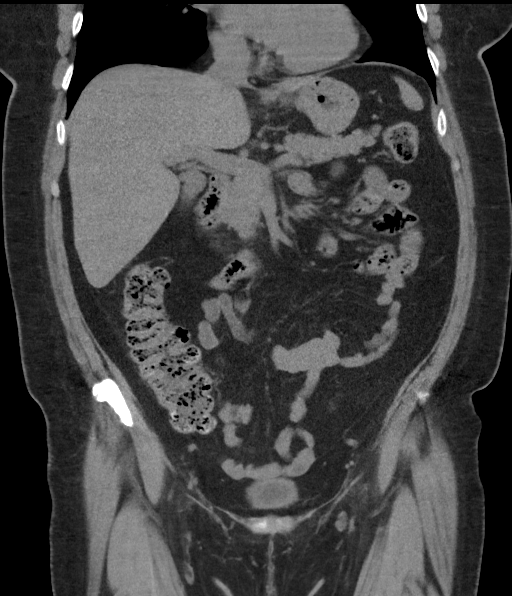
[im 56/101  soft-tissue]
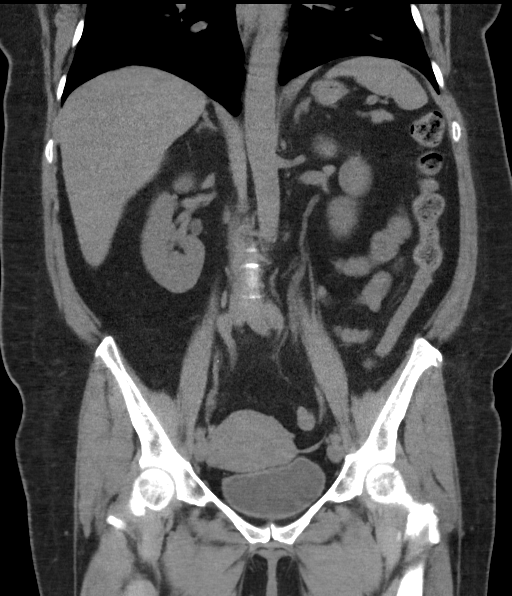

[16 of 46 positions shown; findings below may reference images not displayed]

FINDINGS: Lower chest: Bibasilar atelectasis dependently

Hepatobiliary: Gallbladder and liver normal appearance

Pancreas: Normal appearance

Spleen: Normal appearance

Adrenals/Urinary Tract: Adrenal glands, kidneys, ureters, and
bladder normal appearance. No urinary tract calcification or
dilatation.

Stomach/Bowel: Normal appendix. Bowel loops normal appearance.
Stomach under distended, with proximal wall appearing thick, could
be an artifact but unable to exclude mass in this setting.

Vascular/Lymphatic: Atherosclerotic calcifications aorta and iliac
arteries without aneurysm. No adenopathy. Scattered pelvic
phleboliths.

Reproductive: Unremarkable uterus and adnexa

Other: No free air or free fluid. No acute inflammatory process or
hernia.

Musculoskeletal: Unremarkable
IMPRESSION: No acute intra-abdominal or intrapelvic abnormalities.

Specifically no urinary tract calcification or dilatation.

Questionable wall thickening of the proximal stomach, could be an
artifact but unable to exclude mass in this setting; recommend
correlation with endoscopy.

Aortic Atherosclerosis ([QI]-[QI]).

## 2020-01-12 MED ORDER — PROMETHAZINE HCL 25 MG/ML IJ SOLN
12.5000 mg | Freq: Once | INTRAMUSCULAR | Status: AC
  Administered 2020-01-12: 12.5 mg via INTRAVENOUS
  Filled 2020-01-12: qty 1

## 2020-01-12 MED ORDER — SODIUM CHLORIDE 0.9 % IV BOLUS
1000.0000 mL | Freq: Once | INTRAVENOUS | Status: AC
  Administered 2020-01-12: 1000 mL via INTRAVENOUS

## 2020-01-12 MED ORDER — ONDANSETRON HCL 4 MG/2ML IJ SOLN
4.0000 mg | Freq: Once | INTRAMUSCULAR | Status: AC
  Administered 2020-01-12: 4 mg via INTRAVENOUS
  Filled 2020-01-12: qty 2

## 2020-01-12 NOTE — ED Triage Notes (Signed)
Pt reports n/v x 1 month.   Reports no appetite for the past 2 or 3 months and c/o left flank pain.

## 2020-01-12 NOTE — ED Provider Notes (Signed)
[facility] EMERGENCY DEPARTMENT Provider Note   CSN: [id number] Arrival date & time: 01/12/20  8676     History Chief Complaint  Patient presents with  . Nausea  . Emesis    Dawn Kelley is a 59 y.o. female.  Patient is a 59 year old female with past medical history of anxiety, bipolar, OCD, PTSD.  She presents today for evaluation of a 1 month history of nausea and vomiting.  She also describes decreased appetite and pain to her left flank.  She believes that one of her kidneys is infected.  She describes rhinorrhea when she wakes up in the morning.  She denies any fevers or chills.  Patient tells me she has been on loratadine for many years and stopped taking this medication several weeks ago.  She is concerned that her levels of histamine may be toxic due to stopping this medication.  Patient has an appointment today at 49 with her primary doctor, however called the nurse hotline who told her to come to the ER this morning to be seen.  The history is provided by the patient.       Past Medical History:  Diagnosis Date  . Anxiety   . Bipolar 1 disorder ([place])   . OCD (obsessive compulsive disorder)   . PTSD (post-traumatic stress disorder)     There are no problems to display for this patient.   Past Surgical History:  Procedure Laterality Date  . VEIN SURGERY       OB History   No obstetric history on file.     No family history on file.  Social History   Tobacco Use  . Smoking status: Never Smoker  . Smokeless tobacco: Never Used  Substance Use Topics  . Alcohol use: Not Currently    Comment: occ.   . Drug use: Yes    Types: Marijuana    Home Medications Prior to Admission medications   Medication Sig Start Date End Date Taking? Authorizing Provider  aspirin EC 81 MG tablet Take 81 mg by mouth daily. Swallow whole.    [provider]  cephALEXin (KEFLEX) 500 MG capsule Take 1 capsule (500 mg total) by mouth 4 (four) times daily. Patient  not taking: Reported on 11/26/2019 11/18/19   [name], MD  DULoxetine (CYMBALTA) 30 MG capsule Take 30 mg by mouth daily. 11/13/19   [provider]  gabapentin (NEURONTIN) 600 MG tablet Take 600 mg by mouth 3 (three) times daily. 11/13/19   [provider]  lamoTRIgine (LAMICTAL) 25 MG tablet Take 25 mg by mouth daily. 11/13/19   [provider]  LATUDA 80 MG TABS tablet Take 40 mg by mouth in the morning and at bedtime. 11/13/19   [provider]  LORazepam (ATIVAN) 2 MG tablet Take 2 mg by mouth in the morning, at noon, and at bedtime.    [provider]  Multiple Vitamins-Minerals (WOMENS MULTI VITAMIN & MINERAL PO) Take 1 tablet by mouth daily.    [provider]  Vitamin D, Ergocalciferol, (DRISDOL) 1.25 MG (50000 UNIT) CAPS capsule Take 50,000 Units by mouth every Sunday.    [provider]    Allergies    Seroquel [quetiapine fumarate]  Review of Systems   Review of Systems  All other systems reviewed and are negative.   Physical Exam Updated Vital Signs BP 137/66 (BP Location: Right Arm)   Pulse 70   Temp (!) 97 F (36.1 C) (Temporal)   Resp 18  Ht 5\' 5"  (1.651 m)   Wt 76.7 kg   SpO2 99%   BMI 28.12 kg/m   Physical Exam Vitals and nursing note reviewed.  Constitutional:      General: She is not in acute distress.    Appearance: She is well-developed. She is not diaphoretic.  HENT:     Head: Normocephalic and atraumatic.  Cardiovascular:     Rate and Rhythm: Normal rate and regular rhythm.     Heart sounds: No murmur heard.  No friction rub. No gallop.   Pulmonary:     Effort: Pulmonary effort is normal. No respiratory distress.     Breath sounds: Normal breath sounds. No wheezing.  Abdominal:     General: Bowel sounds are normal. There is no distension.     Palpations: Abdomen is soft.     Tenderness: There is no abdominal tenderness.  Musculoskeletal:        General: Normal range of motion.      Cervical back: Normal range of motion and neck supple.  Skin:    General: Skin is warm and dry.  Neurological:     Mental Status: She is alert and oriented to person, place, and time.     ED Results / Procedures / Treatments   Labs (all labs ordered are listed, but only abnormal results are displayed) Labs Reviewed  COMPREHENSIVE METABOLIC PANEL  CBC WITH DIFFERENTIAL/PLATELET  LIPASE, BLOOD  URINALYSIS, ROUTINE W REFLEX MICROSCOPIC    EKG None  Radiology No results found.  Procedures Procedures (including critical care time)  Medications Ordered in ED Medications  sodium chloride 0.9 % bolus 1,000 mL (has no administration in time range)  ondansetron (ZOFRAN) injection 4 mg (has no administration in time range)    ED Course  I have reviewed the triage vital signs and the nursing notes.  Pertinent labs & imaging results that were available during my care of the patient were reviewed by me and considered in my medical decision making (see chart for details).    MDM Rules/Calculators/A&P  Patient is a 59 year old female with medical history as described in the HPI presenting with ongoing nausea and decreased appetite over the past month.  She has been waking up in the morning with drainage from her nose.  Patient is concerned about recent medication changes and believes that she is "toxic".  Her doses have not changed with the exception of stopping loratadine which she has been on for a prolonged period of time.  Patient's vitals are stable, examination is benign, and laboratory studies and CT scan are all unremarkable.  I have found nothing today physically that requires admission or further intervention.  Patient has an appointment today with her primary doctor at 2:30 PM.  As I have found nothing emergent that requires intervention, I feel as though she is stable for follow-up with this appointment to discuss her medications.  Final Clinical Impression(s) / ED  Diagnoses Final diagnoses:  None    Rx / DC Orders ED Discharge Orders    None       Veryl Speak, MD 01/12/20 1031

## 2020-01-12 NOTE — ED Notes (Signed)
Pt has a large amount of herbal medication pills, psychiatric meds, ketamine from another wellness center.

## 2020-01-12 NOTE — Discharge Instructions (Addendum)
Follow-up with your primary doctor as scheduled this afternoon to discuss your medications and possible referrals to other specialists as they deem indicated.

## 2020-01-22 ENCOUNTER — Ambulatory Visit (INDEPENDENT_AMBULATORY_CARE_PROVIDER_SITE_OTHER): Payer: Medicare Other | Admitting: Allergy & Immunology

## 2020-01-22 ENCOUNTER — Other Ambulatory Visit: Payer: Self-pay

## 2020-01-22 ENCOUNTER — Encounter: Payer: Self-pay | Admitting: Allergy & Immunology

## 2020-01-22 VITALS — BP 118/70 | HR 76 | Resp 18 | Ht 65.5 in | Wt 173.6 lb

## 2020-01-22 DIAGNOSIS — L299 Pruritus, unspecified: Secondary | ICD-10-CM

## 2020-01-22 DIAGNOSIS — R112 Nausea with vomiting, unspecified: Secondary | ICD-10-CM | POA: Insufficient documentation

## 2020-01-22 DIAGNOSIS — L508 Other urticaria: Secondary | ICD-10-CM

## 2020-01-22 MED ORDER — HYDROXYZINE HCL 50 MG PO TABS: 50.0000 mg | ORAL_TABLET | Freq: Every day | ORAL | 2 refills | Status: DC

## 2020-01-22 NOTE — Progress Notes (Signed)
NEW PATIENT  Date of Service/Encounter:  01/22/20  Referring provider: Wannetta Sender, FNP   Assessment:   Pruritus   Nausea and vomiting  History of anxiety - well controlled until March 2021  Plan/Recommendations:   1. Chronic itching - Your history does not have any "red flags" such as fevers, joint pains, or permanent skin changes that would be concerning for a more serious cause of hives.  - We will get some labs to rule out serious causes of itching.  - In the meantime, start suppressive dosing of antihistamines:   - Morning: NOTHING  - Evening: Hydroxyzine 50mg  (this is an antihistamine AND an anxiety medication) - You can change this dosing at home, decreasing the dose as needed or increasing the dosing as needed.  - If you are not tolerating the medications or are tired of taking them every day, we can start treatment with a monthly injectable medication called Xolair.   2. Nausea and vomiting - Testing to all of the foods was negative. - There is a the low positive predictive value of food allergy testing and hence the high possibility of false positives. - In contrast, food allergy testing has a high negative predictive value, therefore if testing is negative we can be relatively assured that they are indeed negative.  - Therefore, I do not think this is related to food allergies and an epinephrine autoinjector is not needed. - Try not to stress out during mealtimes. - Use your Zofran every evening to prevent vomiting during the night. - Continue with your reflux medication (Protonix or pantoprazole) every morning.  - We are going to do some labs to look for an alpha gal allergy (red meat), mast cell disease (serum tryptase), and some metabolic labs to look for electrolyte abnormalities. - We are also referring you to see gastroenterology.  3. Return in about 6 weeks (around 03/04/2020).   Subjective:   Dawn Dawn Kelley is a 59 y.o. female presenting  today for evaluation of  Chief Complaint  Patient presents with  . Allergic Reaction    took claritin for years and researched information causing hives.  . Urticaria    stress induced.   . Emesis    Dawn Dawn Kelley has a history of the following: Patient Active Problem Dawn Kelley   Diagnosis Date Noted  . Nausea and vomiting 01/22/2020  . Pruritus 01/22/2020  . Chronic urticaria 01/22/2020    History obtained from: chart review and patient and her husband  Dawn Dawn Kelley was referred by Dawn Sender, FNP.     Dawn Dawn Kelley is a 59 y.o. female presenting for an evaluation of possible allergic reactions.  She ws on loratadine for years. She changed to Allegra around for 8-10 days. She did not notice any improvement on the Allegra. She is on the Flonase intermittently around and she does not remember when it came into the picture. She will use it when she is congested prior to bedtime. She reports that she uses it once or twice per week at the most. Sometimes she will go weeks without taking it. In general, she has worse symptoms outdoors more than indoors. She is no particularly bad around any animals. She was allergic to cats years ago and they have one now and she seems to be tolerant to it. Her house is dusty and she has cat dander everywhere.    She reports that she read the side effects and she thinks that the antihistamines are making her "  worse". She has never been on hydroxyzine to her knowledge.   She has never been allergy tested at all.   One month ago, she started vomiting every morning. She has been having trouble sleeping as well. She has been sneezing as well. The vomiting occurs in the morning, typically around 2am or 4am. It looks likes mucousy and vanilla colored. Sometimes this is clear. She has had an endoscopy but this was years ago. She has not noticed that it gets worse with any particular food.  She does has GERD and is on pantoprazole and famotidine every day.  This was started a few days ago. She has noticed some improvement with it. She is on the pantoprazole in the morning. Then she was on famotidine, but she was not sure that it started.   She has been on citalopram for 10-12 years and she was stable on that without a problem. This year in March, it stopped working and things spun out of control. She became more and more stressed out. She has had three hospital stays in one month for psychiatric wards. She thinks this was July and August 2021. She did try three ketamine infusions and she felt she was developing nausea from this.   For the past month, she has noticed "funny feelings" with certain foods. These foods include peanut butter and others. She is not very descriptive of what foods she is eating. She is eating oatmeal, ginger tea, and toast. She has been eating potatoes. She is very scared of eating. Most of the day is fine symptoms wise. The vomiting occurs in the nighttime. She does report itching throughout her body. This has been ongoing for a couple of months. She denies any rashes.   Otherwise, there is no history of other atopic diseases, including asthma, drug allergies, stinging insect allergies or contact dermatitis. There is no significant infectious history. Vaccinations are up to date.    Past Medical History: Patient Active Problem Dawn Kelley   Diagnosis Date Noted  . Nausea and vomiting 01/22/2020  . Pruritus 01/22/2020  . Chronic urticaria 01/22/2020    Medication Dawn Kelley:  Allergies as of 01/22/2020      Reactions   Escitalopram Nausea And Vomiting   Tolerated citalopram with out difficulty for many years.   Seroquel [quetiapine Fumarate] Shortness Of Breath      Medication Dawn Kelley       Accurate as of January 22, 2020  2:24 PM. If you have any questions, ask your nurse or doctor.        STOP taking these medications   cephALEXin 500 MG capsule Commonly known as: KEFLEX Stopped by: Valentina Shaggy, MD   DULoxetine  30 MG capsule Commonly known as: CYMBALTA Stopped by: Valentina Shaggy, MD   gabapentin 600 MG tablet Commonly known as: NEURONTIN Stopped by: Valentina Shaggy, MD   lamoTRIgine 25 MG tablet Commonly known as: LAMICTAL Stopped by: Valentina Shaggy, MD   Latuda 80 MG Tabs tablet Generic drug: lurasidone Stopped by: Valentina Shaggy, MD   WOMENS MULTI VITAMIN & MINERAL PO Stopped by: Valentina Shaggy, MD     TAKE these medications   aspirin EC 81 MG tablet Take 81 mg by mouth daily. Swallow whole.   calcium carbonate 750 MG chewable tablet Commonly known as: TUMS EX Chew 1 tablet by mouth 2 (two) times daily.   citalopram 20 MG tablet Commonly known as: CELEXA 1/2 tablet daily for 2 days then increase to a  full tablet   clonazePAM 1 MG tablet Commonly known as: KLONOPIN Take 1 mg by mouth 2 (two) times daily as needed.   famotidine 20 MG tablet Commonly known as: PEPCID Take by mouth.   fexofenadine 180 MG tablet Commonly known as: ALLEGRA Take by mouth.   fluticasone 50 MCG/ACT nasal spray Commonly known as: FLONASE Place 1 spray into both nostrils daily.   hydrOXYzine 50 MG tablet Commonly known as: ATARAX/VISTARIL Take 1 tablet (50 mg total) by mouth at bedtime. Started by: Valentina Shaggy, MD   LORazepam 2 MG tablet Commonly known as: ATIVAN Take 2 mg by mouth in the morning, at noon, and at bedtime.   Melatonin 5 MG Caps Take by mouth.   nystatin cream Commonly known as: MYCOSTATIN Apply topically.   ondansetron 4 MG tablet Commonly known as: ZOFRAN   pantoprazole 20 MG tablet Commonly known as: PROTONIX Take by mouth.   vitamin B-12 500 MCG tablet Commonly known as: CYANOCOBALAMIN Take by mouth.   Vitamin D (Ergocalciferol) 1.25 MG (50000 UNIT) Caps capsule Commonly known as: DRISDOL Take 50,000 Units by mouth every Sunday.       Birth History: non-contributory  Developmental History:  non-contributory  Past Surgical History: Past Surgical History:  Procedure Laterality Date  . VEIN SURGERY       Family History: History reviewed. No pertinent family history.   Social History: Mariene lives at home with her husband.  The house is built in 1946.  There is wood throughout the home.  Occasionally, there is water in the basement.  There is no mold damage that they know of.  There is a cat and dog inside of the home.  They do have dust mite covers on the bedding.  There is no tobacco exposure.  She is not exposed to fumes, chemicals, or dust.  She does use a HEPA filter.  There are no interstates or industrial areas near her home.  She stays at home.  She does not work outside of the home.  Her husband is retired [employer]'s [employer].   Review of Systems  Constitutional: Negative.  Negative for chills, fever, malaise/fatigue and weight loss.  HENT: Negative for congestion, ear discharge, ear pain and sinus pain.   Eyes: Negative for pain, discharge and redness.  Respiratory: Negative for cough, sputum production, shortness of breath and wheezing.   Cardiovascular: Negative.  Negative for chest pain and palpitations.  Gastrointestinal: Negative for abdominal pain, constipation, diarrhea, heartburn, nausea and vomiting.  Skin: Positive for itching and rash.  Neurological: Negative for dizziness and headaches.  Endo/Heme/Allergies: Negative for environmental allergies. Does not bruise/bleed easily.  Psychiatric/Behavioral: Positive for depression. The patient is nervous/anxious.        Objective:   Blood pressure 118/70, pulse 76, resp. rate 18, height 5' 5.5" (1.664 m), weight 173 lb 9.6 oz (78.7 kg), SpO2 97 %. Body mass index is 28.45 kg/m.   Physical Exam:   Physical Exam Constitutional:      Appearance: She is well-developed.     Comments: Tearful.  Anxious.  HENT:     Head: Normocephalic and atraumatic.     Right Ear: Tympanic membrane, ear canal and external ear  normal. No drainage, swelling or tenderness. Tympanic membrane is not injected, scarred, erythematous, retracted or bulging.     Left Ear: Tympanic membrane, ear canal and external ear normal. No drainage, swelling or tenderness. Tympanic membrane is not injected, scarred, erythematous, retracted or bulging.  Nose: No nasal deformity, septal deviation, mucosal edema or rhinorrhea.     Right Turbinates: Enlarged and swollen.     Left Turbinates: Enlarged and swollen.     Right Sinus: No maxillary sinus tenderness or frontal sinus tenderness.     Left Sinus: No maxillary sinus tenderness or frontal sinus tenderness.     Comments: Turbinates are glistening with clear rhinorrhea.    Mouth/Throat:     Mouth: Mucous membranes are not pale and not dry.     Pharynx: Uvula midline.  Eyes:     General:        Right eye: No discharge.        Left eye: No discharge.     Conjunctiva/sclera: Conjunctivae normal.     Right eye: Right conjunctiva is not injected. No chemosis.    Left eye: Left conjunctiva is not injected. No chemosis.    Pupils: Pupils are equal, round, and reactive to light.  Cardiovascular:     Rate and Rhythm: Normal rate and regular rhythm.     Heart sounds: Normal heart sounds.  Pulmonary:     Effort: Pulmonary effort is normal. No tachypnea, accessory muscle usage or respiratory distress.     Breath sounds: Normal breath sounds. No wheezing, rhonchi or rales.  Chest:     Chest wall: No tenderness.  Abdominal:     Tenderness: There is no abdominal tenderness. There is no guarding or rebound.  Lymphadenopathy:     Head:     Right side of head: No submandibular, tonsillar or occipital adenopathy.     Left side of head: No submandibular, tonsillar or occipital adenopathy.     Cervical: No cervical adenopathy.  Skin:    Coloration: Skin is not pale.     Findings: No abrasion, erythema, petechiae or rash. Rash is not papular, urticarial or vesicular.     Comments: Skin is  dermatographic.  Neurological:     Mental Status: She is alert.      Diagnostic studies:   Allergy Studies:     Food Adult Perc - 01/22/20 1000    Time Antigen Placed 1022    Allergen Manufacturer Lavella Hammock    Location Back    Number of allergen test 72    Panel 2 Select     Control-buffer 50% Glycerol Negative    Control-Histamine 1 mg/ml 2+    1. Peanut Negative    2. Soybean Negative    3. Wheat Negative    4. Sesame Negative    5. Milk, cow Negative    6. Egg White, Chicken Negative    7. Casein Negative    8. Shellfish Mix Negative    9. Fish Mix Negative    10. Cashew Negative    11. Pecan Food Negative    12. Lecompte Negative    13. Almond Negative    14. Hazelnut Negative    15. [place] nut Negative    16. Coconut Negative    17. Pistachio Negative    18. Catfish Negative    19. Bass Negative    20. Trout Negative    21. Tuna Negative    22. Salmon Negative    23. Flounder Negative    24. Codfish Negative    25. Shrimp Negative    26. Crab Negative    27. Lobster Negative    28. Oyster Negative    29. Scallops Negative    30. Barley Negative    31. Oat  Negative    32. Rye  Negative    33. Hops Negative    34. Rice Negative    35. Cottonseed Negative    36. Saccharomyces Cerevisiae  Negative    37. Pork Negative    38. [place] Meat Negative    39. Chicken Meat Negative    40. Beef Negative    41. Lamb Negative    42. Tomato Negative    43. White Potato Negative    44. Sweet Potato Negative    45. Pea, Green/English Negative    46. Navy Bean Negative    47. Mushrooms Negative    48. Avocado Negative    49. Onion Negative    50. Cabbage Negative    51. Carrots Negative    52. Celery Negative    53. Corn Negative    54. Cucumber Negative    55. Grape (White seedless) Negative    56. Orange  Negative    57. Banana Negative    58. Apple Negative    59. Peach Negative    60. Strawberry Negative    61. Cantaloupe Negative    62. Watermelon  Negative    63. Pineapple Negative    64. Chocolate/Cacao bean Negative    65. Karaya Gum Negative    66. Acacia (Arabic Gum) Negative    67. Cinnamon Negative    68. Nutmeg Negative    69. Ginger Negative    70. Garlic Negative    71. Pepper, black Negative    72. Mustard Negative           Allergy testing results were read and interpreted by myself, documented by clinical staff.         Salvatore Marvel, MD [facility] [place] [facility] [place]

## 2020-01-22 NOTE — Patient Instructions (Addendum)
1. Chronic itching - Your history does not have any "red flags" such as fevers, joint pains, or permanent skin changes that would be concerning for a more serious cause of hives.  - We will get some labs to rule out serious causes of itching.  - In the meantime, start suppressive dosing of antihistamines:   - Morning: NOTHING  - Evening: Hydroxyzine 50mg  (this is an antihistamine AND an anxiety medication) - You can change this dosing at home, decreasing the dose as needed or increasing the dosing as needed.  - If you are not tolerating the medications or are tired of taking them every day, we can start treatment with a monthly injectable medication called Xolair.   2. Nausea and vomiting - Testing to all of the foods was negative. - There is a the low positive predictive value of food allergy testing and hence the high possibility of false positives. - In contrast, food allergy testing has a high negative predictive value, therefore if testing is negative we can be relatively assured that they are indeed negative.  - Therefore, I do not think this is related to food allergies and an epinephrine autoinjector is not needed. - Try not to stress out during mealtimes. - Use your Zofran every evening to prevent vomiting during the night. - Continue with your reflux medication (Protonix or pantoprazole) every morning.  - We are going to do some labs to look for an alpha gal allergy (red meat), mast cell disease (serum tryptase), and some metabolic labs to look for electrolyte abnormalities. - We are also referring you to see gastroenterology.  3. Return in about 6 weeks (around 03/04/2020).   Please inform us of any Emergency Department visits, hospitalizations, or changes in symptoms. Call us before going to the ED for breathing or allergy symptoms since we might be able to fit you in for a sick visit. Feel free to contact us anytime with any questions, problems, or concerns.  It was a pleasure to  meet you and your family today!  Websites that have reliable patient information: 1. American Academy of Asthma, Allergy, and Immunology: www.aaaai.org 2. Food Allergy Research and Education (FARE): foodallergy.org 3. Mothers of Asthmatics: http://www.asthmacommunitynetwork.org 4. American College of Allergy, Asthma, and Immunology: www.acaai.org   COVID-19 Vaccine Information can be found at: ShippingScam.co.uk For questions related to vaccine distribution or appointments, please email [email or web address] or call [phone].     "Like" [place] on Facebook and Instagram for our latest updates!        Make sure you are registered to vote! If you have moved or changed any of your contact information, you will need to get this updated before voting!  In some cases, you MAY be able to register to vote online: [email or web address]

## 2020-01-25 ENCOUNTER — Encounter: Payer: Self-pay | Admitting: Internal Medicine

## 2020-01-26 ENCOUNTER — Telehealth: Payer: Self-pay

## 2020-01-26 NOTE — Telephone Encounter (Signed)
Patient called stating she is scared for her life as she is now bleeding out of her mouth and anus. Patient states the hospitals are to full to go there. Patient requested to be seen at [facility] as they may can get her in sooner than 03/11/20 @ [facility]. I have placed a referral in proficient for [employer].   Please Advise Dr Ernst Bowler.

## 2020-01-26 NOTE — Telephone Encounter (Signed)
Patient is scheduled to see Dr.[facility]JEEB [facility] tomorrow in the [facility].  Thanks

## 2020-01-26 NOTE — Telephone Encounter (Signed)
See what strings you can pull. I would not hesitate to go to the hospital, though. If she has an acute bleed like that, she will need more emergent care.   Salvatore Marvel, MD [facility] and [place] [facility] [place]

## 2020-01-27 ENCOUNTER — Ambulatory Visit (INDEPENDENT_AMBULATORY_CARE_PROVIDER_SITE_OTHER): Payer: Medicare Other | Admitting: Gastroenterology

## 2020-01-27 ENCOUNTER — Encounter (INDEPENDENT_AMBULATORY_CARE_PROVIDER_SITE_OTHER): Payer: Self-pay | Admitting: *Deleted

## 2020-01-27 ENCOUNTER — Other Ambulatory Visit (INDEPENDENT_AMBULATORY_CARE_PROVIDER_SITE_OTHER): Payer: Self-pay | Admitting: *Deleted

## 2020-01-27 ENCOUNTER — Other Ambulatory Visit: Payer: Self-pay

## 2020-01-27 ENCOUNTER — Encounter (INDEPENDENT_AMBULATORY_CARE_PROVIDER_SITE_OTHER): Payer: Self-pay | Admitting: Gastroenterology

## 2020-01-27 ENCOUNTER — Telehealth (INDEPENDENT_AMBULATORY_CARE_PROVIDER_SITE_OTHER): Payer: Self-pay | Admitting: *Deleted

## 2020-01-27 VITALS — BP 122/83 | HR 84 | Temp 97.8°F | Ht 65.5 in | Wt 172.2 lb

## 2020-01-27 DIAGNOSIS — R1084 Generalized abdominal pain: Secondary | ICD-10-CM

## 2020-01-27 DIAGNOSIS — R109 Unspecified abdominal pain: Secondary | ICD-10-CM | POA: Insufficient documentation

## 2020-01-27 DIAGNOSIS — R112 Nausea with vomiting, unspecified: Secondary | ICD-10-CM | POA: Diagnosis not present

## 2020-01-27 DIAGNOSIS — K582 Mixed irritable bowel syndrome: Secondary | ICD-10-CM | POA: Insufficient documentation

## 2020-01-27 DIAGNOSIS — K581 Irritable bowel syndrome with constipation: Secondary | ICD-10-CM

## 2020-01-27 DIAGNOSIS — K580 Irritable bowel syndrome with diarrhea: Secondary | ICD-10-CM | POA: Insufficient documentation

## 2020-01-27 MED ORDER — PLENVU 140 G PO SOLR: 1.0000 | Freq: Once | ORAL | 0 refills | Status: AC

## 2020-01-27 MED ORDER — LINACLOTIDE 72 MCG PO CAPS: 72.0000 ug | ORAL_CAPSULE | Freq: Every day | ORAL | 2 refills | Status: DC
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
## 2020-01-27 NOTE — Patient Instructions (Addendum)
Follow up with psychiatry regarding mental health management Explained presumed etiology of IBS symptoms. Patient was counseled about the benefit of implementing a low FODMAP to improve symptoms and recurrent episodes. A dietary list was provided to the patient. Also, the patient was counseled about the benefit of avoiding stressing situations and potential environmental triggers leading to symptomatology. Schedule EGD with SB biopsies and colonoscopy Start Linzess 72 mcg qday

## 2020-01-27 NOTE — Telephone Encounter (Signed)
Patient needs Plenvu (copay card) ° °
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
## 2020-02-01 ENCOUNTER — Other Ambulatory Visit (HOSPITAL_COMMUNITY)
Admission: RE | Admit: 2020-02-01 | Discharge: 2020-02-01 | Disposition: A | Discharge status: Home / Self Care | Payer: Medicare Other | Source: Ambulatory Visit | Attending: Gastroenterology | Admitting: Gastroenterology

## 2020-02-01 ENCOUNTER — Other Ambulatory Visit: Payer: Self-pay

## 2020-02-01 DIAGNOSIS — Z20822 Contact with and (suspected) exposure to covid-19: Secondary | ICD-10-CM | POA: Diagnosis not present

## 2020-02-01 DIAGNOSIS — Z01812 Encounter for preprocedural laboratory examination: Secondary | ICD-10-CM | POA: Diagnosis present

## 2020-02-01 LAB — SARS CORONAVIRUS 2 (TAT 6-24 HRS): SARS Coronavirus 2: NEGATIVE

## 2020-02-03 ENCOUNTER — Ambulatory Visit (HOSPITAL_COMMUNITY): Payer: Medicare Other | Admitting: Anesthesiology

## 2020-02-03 ENCOUNTER — Encounter (HOSPITAL_COMMUNITY)
Admission: RE | Disposition: A | Discharge status: Home / Self Care | Payer: Self-pay | Source: Home / Self Care | Attending: Gastroenterology

## 2020-02-03 ENCOUNTER — Encounter (HOSPITAL_COMMUNITY): Payer: Self-pay | Admitting: Gastroenterology

## 2020-02-03 ENCOUNTER — Ambulatory Visit (HOSPITAL_COMMUNITY)
Admission: RE | Admit: 2020-02-03 | Discharge: 2020-02-03 | Disposition: A | Discharge status: Home / Self Care | Payer: Medicare Other | Attending: Gastroenterology | Admitting: Gastroenterology

## 2020-02-03 ENCOUNTER — Other Ambulatory Visit: Payer: Self-pay

## 2020-02-03 DIAGNOSIS — Z7982 Long term (current) use of aspirin: Secondary | ICD-10-CM | POA: Diagnosis not present

## 2020-02-03 DIAGNOSIS — D125 Benign neoplasm of sigmoid colon: Secondary | ICD-10-CM | POA: Diagnosis not present

## 2020-02-03 DIAGNOSIS — K317 Polyp of stomach and duodenum: Secondary | ICD-10-CM | POA: Insufficient documentation

## 2020-02-03 DIAGNOSIS — F419 Anxiety disorder, unspecified: Secondary | ICD-10-CM | POA: Insufficient documentation

## 2020-02-03 DIAGNOSIS — Z818 Family history of other mental and behavioral disorders: Secondary | ICD-10-CM | POA: Diagnosis not present

## 2020-02-03 DIAGNOSIS — Z888 Allergy status to other drugs, medicaments and biological substances status: Secondary | ICD-10-CM | POA: Diagnosis not present

## 2020-02-03 DIAGNOSIS — F319 Bipolar disorder, unspecified: Secondary | ICD-10-CM | POA: Insufficient documentation

## 2020-02-03 DIAGNOSIS — Z79899 Other long term (current) drug therapy: Secondary | ICD-10-CM | POA: Diagnosis not present

## 2020-02-03 DIAGNOSIS — K5909 Other constipation: Secondary | ICD-10-CM | POA: Insufficient documentation

## 2020-02-03 DIAGNOSIS — F429 Obsessive-compulsive disorder, unspecified: Secondary | ICD-10-CM | POA: Insufficient documentation

## 2020-02-03 DIAGNOSIS — Z1211 Encounter for screening for malignant neoplasm of colon: Secondary | ICD-10-CM | POA: Diagnosis present

## 2020-02-03 DIAGNOSIS — F4310 Post-traumatic stress disorder, unspecified: Secondary | ICD-10-CM | POA: Diagnosis not present

## 2020-02-03 DIAGNOSIS — K649 Unspecified hemorrhoids: Secondary | ICD-10-CM | POA: Insufficient documentation

## 2020-02-03 DIAGNOSIS — Z836 Family history of other diseases of the respiratory system: Secondary | ICD-10-CM | POA: Insufficient documentation

## 2020-02-03 DIAGNOSIS — R12 Heartburn: Secondary | ICD-10-CM | POA: Diagnosis not present

## 2020-02-03 DIAGNOSIS — K219 Gastro-esophageal reflux disease without esophagitis: Secondary | ICD-10-CM | POA: Diagnosis not present

## 2020-02-03 DIAGNOSIS — R1013 Epigastric pain: Secondary | ICD-10-CM | POA: Diagnosis not present

## 2020-02-03 HISTORY — PX: ESOPHAGOGASTRODUODENOSCOPY (EGD) WITH PROPOFOL: SHX5813

## 2020-02-03 HISTORY — PX: BIOPSY: SHX5522

## 2020-02-03 HISTORY — PX: POLYPECTOMY: SHX5525

## 2020-02-03 HISTORY — PX: COLONOSCOPY WITH PROPOFOL: SHX5780

## 2020-02-03 LAB — CBC WITH DIFFERENTIAL/PLATELET
Basophils Absolute: 0 10*3/uL (ref 0.0–0.2)
Basos: 1 %
EOS (ABSOLUTE): 0 10*3/uL (ref 0.0–0.4)
Eos: 0 %
Hematocrit: 41.9 % (ref 34.0–46.6)
Hemoglobin: 14.4 g/dL (ref 11.1–15.9)
Immature Grans (Abs): 0 10*3/uL (ref 0.0–0.1)
Immature Granulocytes: 0 %
Lymphocytes Absolute: 1.9 10*3/uL (ref 0.7–3.1)
Lymphs: 27 %
MCH: 29.2 pg (ref 26.6–33.0)
MCHC: 34.4 g/dL (ref 31.5–35.7)
MCV: 85 fL (ref 79–97)
Monocytes Absolute: 0.3 10*3/uL (ref 0.1–0.9)
Monocytes: 4 %
Neutrophils Absolute: 4.8 10*3/uL (ref 1.4–7.0)
Neutrophils: 68 %
Platelets: 332 10*3/uL (ref 150–450)
RBC: 4.93 x10E6/uL (ref 3.77–5.28)
RDW: 13.2 % (ref 11.7–15.4)
WBC: 7.1 10*3/uL (ref 3.4–10.8)

## 2020-02-03 LAB — IGE+ALLERGENS ZONE 2(30)

## 2020-02-03 LAB — CMP14+EGFR
ALT: 25 IU/L (ref 0–32)
AST: 14 IU/L (ref 0–40)
Albumin/Globulin Ratio: 1.4 (ref 1.2–2.2)
Albumin: 4.4 g/dL (ref 3.8–4.9)
Alkaline Phosphatase: 116 IU/L (ref 48–121)
BUN/Creatinine Ratio: 14 (ref 12–28)
BUN: 8 mg/dL (ref 8–27)
Bilirubin Total: <0.2 mg/dL (ref 0.0–1.2)
CO2: 24 mmol/L (ref 20–29)
Calcium: 9.4 mg/dL (ref 8.7–10.3)
Chloride: 102 mmol/L (ref 96–106)
Creatinine, Ser: 0.58 mg/dL (ref 0.57–1.00)
GFR calc Af Amer: 116 mL/min/{1.73_m2} (ref >59)
GFR calc non Af Amer: 101 mL/min/{1.73_m2} (ref >59)
Globulin, Total: 3.1 g/dL (ref 1.5–4.5)
Glucose: 95 mg/dL (ref 65–99)
Potassium: 4.3 mmol/L (ref 3.5–5.2)
Sodium: 141 mmol/L (ref 134–144)
Total Protein: 7.5 g/dL (ref 6.0–8.5)

## 2020-02-03 LAB — LIPASE: Lipase: 28 U/L (ref 14–72)

## 2020-02-03 LAB — HM COLONOSCOPY

## 2020-02-03 LAB — SEDIMENTATION RATE: Sed Rate: 39 mm/hr (ref 0–40)

## 2020-02-03 LAB — ANA W/REFLEX IF POSITIVE: Anti Nuclear Antibody (ANA): NEGATIVE

## 2020-02-03 LAB — ALPHA-GAL PANEL
Alpha Gal IgE*: <0.1 kU/L (ref <0.10)
Beef (Bos spp) IgE: <0.1 kU/L (ref <0.35)
Class Interpretation: 0
Class Interpretation: 0
Class Interpretation: 0
Lamb/Mutton (Ovis spp) IgE: <0.1 kU/L (ref <0.35)
Pork (Sus spp) IgE: <0.1 kU/L (ref <0.35)

## 2020-02-03 LAB — GAMMA GT: GGT: 15 IU/L (ref 0–60)

## 2020-02-03 LAB — CHRONIC URTICARIA: cu index: 3.8 (ref <10)

## 2020-02-03 LAB — C-REACTIVE PROTEIN: CRP: 6 mg/L (ref 0–10)

## 2020-02-03 LAB — TRYPTASE: Tryptase: 4.3 ug/L (ref 2.2–13.2)

## 2020-02-03 SURGERY — COLONOSCOPY WITH PROPOFOL
Anesthesia: General

## 2020-02-03 MED ORDER — GLYCOPYRROLATE 0.2 MG/ML IJ SOLN
0.2000 mg | Freq: Once | INTRAMUSCULAR | Status: AC
  Administered 2020-02-03: 0.2 mg via INTRAVENOUS

## 2020-02-03 MED ORDER — PROPOFOL 10 MG/ML IV BOLUS
INTRAVENOUS | Status: AC
  Filled 2020-02-03: qty 100

## 2020-02-03 MED ORDER — MIDAZOLAM HCL 2 MG/2ML IJ SOLN
INTRAMUSCULAR | Status: AC
  Filled 2020-02-03: qty 2

## 2020-02-03 MED ORDER — LACTATED RINGERS IV SOLN: INTRAVENOUS | Status: DC | PRN

## 2020-02-03 MED ORDER — LIDOCAINE VISCOUS HCL 2 % MT SOLN
15.0000 mL | Freq: Once | OROMUCOSAL | Status: AC
  Administered 2020-02-03: 15 mL via OROMUCOSAL

## 2020-02-03 MED ORDER — MIDAZOLAM HCL 5 MG/5ML IJ SOLN
INTRAMUSCULAR | Status: DC | PRN
  Administered 2020-02-03: 2 mg via INTRAVENOUS

## 2020-02-03 MED ORDER — GLYCOPYRROLATE 0.2 MG/ML IJ SOLN
INTRAMUSCULAR | Status: AC
  Filled 2020-02-03: qty 1

## 2020-02-03 MED ORDER — LIDOCAINE 2% (20 MG/ML) 5 ML SYRINGE
INTRAMUSCULAR | Status: AC
  Filled 2020-02-03: qty 25

## 2020-02-03 MED ORDER — LIDOCAINE HCL (CARDIAC) PF 100 MG/5ML IV SOSY
PREFILLED_SYRINGE | INTRAVENOUS | Status: DC | PRN
  Administered 2020-02-03 (×2): 50 mg via INTRAVENOUS

## 2020-02-03 MED ORDER — LIDOCAINE VISCOUS HCL 2 % MT SOLN
OROMUCOSAL | Status: AC
  Filled 2020-02-03: qty 15

## 2020-02-03 MED ORDER — PROPOFOL 500 MG/50ML IV EMUL
INTRAVENOUS | Status: DC | PRN
  Administered 2020-02-03: 125 ug/kg/min via INTRAVENOUS
  Administered 2020-02-03: 150 ug/kg/min via INTRAVENOUS

## 2020-02-03 MED ORDER — LACTATED RINGERS IV SOLN: Freq: Once | INTRAVENOUS | Status: AC

## 2020-02-03 MED ORDER — STERILE WATER FOR IRRIGATION IR SOLN
Status: DC | PRN
  Administered 2020-02-03: 1.5 mL

## 2020-02-03 MED ORDER — PROPOFOL 10 MG/ML IV BOLUS
INTRAVENOUS | Status: DC | PRN
  Administered 2020-02-03 (×2): 40 mg via INTRAVENOUS
  Administered 2020-02-03: 20 mg via INTRAVENOUS

## 2020-02-03 NOTE — Transfer of Care (Signed)
Immediate Anesthesia Transfer of Care Note  Patient: Dawn Kelley  Procedure(s) Performed: COLONOSCOPY WITH PROPOFOL (N/A ) ESOPHAGOGASTRODUODENOSCOPY (EGD) WITH PROPOFOL (N/A ) BIOPSY POLYPECTOMY  Patient Location: PACU  Anesthesia Type:General  Level of Consciousness: drowsy  Airway & Oxygen Therapy: Patient Spontanous Breathing and Patient connected to nasal cannula oxygen  Post-op Assessment: Report given to RN and Post -op Vital signs reviewed and stable  Post vital signs: Reviewed and stable  Last Vitals:  Vitals Value Taken Time  BP    Temp    Pulse    Resp    SpO2      Last Pain:  Vitals:   02/03/20 0829  TempSrc:   PainSc: 7       Patients Stated Pain Goal:  (patient states doesn't take meds for pain that often) (58/48/35 0757)  Complications: No complications documented.

## 2020-02-03 NOTE — Op Note (Signed)
[facility] Patient Name: Dawn Kelley Procedure Date: 02/03/2020 7:51 AM MRN: [id number] Date of Birth: [birth year] Attending MD: [name] ,  CSN: [id number] Age: 59 Admit Type: Outpatient Procedure:                Upper GI endoscopy Indications:              Epigastric abdominal pain, Heartburn Providers:                [name], Janeece Riggers, RN, Tammy Vaught,                            RN, Lambert Mody, Casimer Bilis,                            Technician Referring MD:              Medicines:                Monitored Anesthesia Care Complications:            No immediate complications. Estimated Blood Loss:     Estimated blood loss: none. Procedure:                Pre-Anesthesia Assessment:                           - Prior to the procedure, a History and Physical                            was performed, and patient medications, allergies                            and sensitivities were reviewed. The patient's                            tolerance of previous anesthesia was reviewed.                           - The risks and benefits of the procedure and the                            sedation options and risks were discussed with the                            patient. All questions were answered and informed                            consent was obtained.                           - ASA Grade Assessment: II - A patient with mild                            systemic disease.                           After obtaining informed consent, the endoscope was  passed under direct vision. Throughout the                            procedure, the patient's blood pressure, pulse, and                            oxygen saturations were monitored continuously. The                            [id number]-H190 ([id number]) scope was introduced through the                            mouth, and advanced to the second part of duodenum.                             The upper GI endoscopy was accomplished without                            difficulty. The patient tolerated the procedure                            well. Scope In: 8:34:52 AM Scope Out: 8:39:49 AM Total Procedure Duration: 0 hours 4 minutes 57 seconds  Findings:      The examined esophagus was normal.      Multiple 1 to 2 mm sessile fundic gland polyps with no bleeding were       found in the gastric fundus and in the gastric body.      The examined duodenum was normal. Biopsies for histology were taken with       a cold forceps for evaluation of celiac disease. Impression:               - Normal esophagus.                           - Multiple gastric polyps.                           - Normal examined duodenum. Biopsied. Moderate Sedation:      Per Anesthesia Care Recommendation:           - Discharge patient to home (ambulatory).                           - Adhere to low FODMAP diet.                           - Await pathology results.                           - Return to GI clinic in 3 months. Procedure Code(s):        --- Professional ---                           [phone], GC, Esophagogastroduodenoscopy, flexible,  transoral; with biopsy, single or multiple Diagnosis Code(s):        --- Professional ---                           K31.7, Polyp of stomach and duodenum                           R10.13, Epigastric pain                           R12, Heartburn CPT copyright 2019 [employer]. All rights reserved. The codes documented in this report are preliminary and upon coder review may  be revised to meet current compliance requirements. [name], MD [name],  02/03/2020 9:05:32 AM This report has been signed electronically. Number of Addenda: 0

## 2020-02-03 NOTE — Anesthesia Postprocedure Evaluation (Signed)
Anesthesia Post Note  Patient: Dawn Kelley  Procedure(s) Performed: COLONOSCOPY WITH PROPOFOL (N/A ) ESOPHAGOGASTRODUODENOSCOPY (EGD) WITH PROPOFOL (N/A ) BIOPSY POLYPECTOMY  Patient location during evaluation: Endoscopy Anesthesia Type: General Level of consciousness: awake and alert and oriented Vital Signs Assessment: post-procedure vital signs reviewed and stable Respiratory status: spontaneous breathing Cardiovascular status: blood pressure returned to baseline Postop Assessment: no apparent nausea or vomiting Anesthetic complications: no   No complications documented.   Last Vitals:  Vitals:   02/03/20 0740 02/03/20 0901  BP: 125/70 (!) 98/55  Pulse: 62 (!) 48  Resp: 13 17  Temp: 36.5 C 36.5 C  SpO2: 97% 97%    Last Pain:  Vitals:   02/03/20 0901  TempSrc: Oral  PainSc:                  Kymere Fullington C Aleeha Boline

## 2020-02-03 NOTE — Op Note (Signed)
[facility] Patient Name: Dawn Kelley Procedure Date: 02/03/2020 7:52 AM MRN: [id number] Date of Birth: Apr 02, 1961 Attending MD: [name] ,  CSN: [id number] Age: 59 Admit Type: Outpatient Procedure:                Colonoscopy Indications:              Screening for colorectal malignant neoplasm Providers:                [name], Janeece Riggers, RN, Tammy Vaught,                            RN, Lambert Mody, Casimer Bilis,                            Technician Referring MD:              Medicines:                Monitored Anesthesia Care Complications:            No immediate complications. Estimated Blood Loss:     Estimated blood loss: none. Procedure:                Pre-Anesthesia Assessment:                           - Prior to the procedure, a History and Physical                            was performed, and patient medications, allergies                            and sensitivities were reviewed. The patient's                            tolerance of previous anesthesia was reviewed.                           - The risks and benefits of the procedure and the                            sedation options and risks were discussed with the                            patient. All questions were answered and informed                            consent was obtained.                           - ASA Grade Assessment: II - A patient with mild                            systemic disease.                           After obtaining informed consent, the colonoscope  was passed under direct vision. Throughout the                            procedure, the patient's blood pressure, pulse, and                            oxygen saturations were monitored continuously. The                            [id number]-[id number] ([id number] scope was introduced through                            the anus and advanced to the the cecum, identified                             by appendiceal orifice and ileocecal valve. The                            colonoscopy was performed without difficulty. The                            patient tolerated the procedure well. The quality                            of the bowel preparation was adequate. Scope                            withdrawal time was 12 minutes. Scope In: 8:43:15 AM Scope Out: 8:58:53 AM Scope Withdrawal Time: 0 hours 13 minutes 15 seconds  Total Procedure Duration: 0 hours 15 minutes 38 seconds  Findings:      Hemorrhoids were found on perianal exam.      A 3 mm polyp was found in the sigmoid colon. The polyp was sessile. The       polyp was removed with a cold snare. Resection and retrieval were       complete.      The retroflexed view of the distal rectum and anal verge was normal and       showed no anal or rectal abnormalities. Impression:               - Hemorrhoids found on perianal exam.                           - One 3 mm polyp in the sigmoid colon, removed with                            a cold snare. Resected and retrieved.                           - The distal rectum and anal verge are normal on                            retroflexion view. Moderate Sedation:      Per Anesthesia Care Recommendation:           -  Discharge patient to home (ambulatory).                           - Resume previous diet.                           - Repeat colonoscopy date to be determined after                            pending pathology results are reviewed for                            surveillance. Procedure Code(s):        --- Professional ---                           [id number], GC, Colonoscopy, flexible; with removal of                            tumor(s), polyp(s), or other lesion(s) by snare                            technique Diagnosis Code(s):        --- Professional ---                           Z12.11, Encounter for screening for malignant                            neoplasm of  colon                           K63.5, Polyp of colon                           K64.9, Unspecified hemorrhoids CPT copyright 2019 American [employer] Association. All rights reserved. The codes documented in this report are preliminary and upon coder review may  be revised to meet current compliance requirements. [name], MD [name],  02/03/2020 9:08:29 AM This report has been signed electronically. Number of Addenda: 0
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
## 2020-02-03 NOTE — Anesthesia Preprocedure Evaluation (Addendum)
Anesthesia Evaluation  Patient identified by MRN, date of birth, ID band Patient awake    Reviewed: Allergy & Precautions, NPO status , Patient's Chart, lab work & pertinent test results  Airway Mallampati: II  TM Distance: >3 FB Neck ROM: Full    Dental  (+) Dental Advisory Given Crowns :   Pulmonary neg pulmonary ROS,    Pulmonary exam normal breath sounds clear to auscultation       Cardiovascular Exercise Tolerance: Good Normal cardiovascular exam Rhythm:Regular Rate:Normal     Neuro/Psych PSYCHIATRIC DISORDERS Anxiety Bipolar Disorder negative neurological ROS     GI/Hepatic Neg liver ROS, GERD  ,  Endo/Other  negative endocrine ROS  Renal/GU negative Renal ROS     Musculoskeletal negative musculoskeletal ROS (+)   Abdominal   Peds  Hematology negative hematology ROS (+)   Anesthesia Other Findings   Reproductive/Obstetrics negative OB ROS                            Anesthesia Physical Anesthesia Plan  ASA: II  Anesthesia Plan: General   Post-op Pain Management:    Induction: Intravenous  PONV Risk Score and Plan: TIVA  Airway Management Planned: Nasal Cannula and Natural Airway  Additional Equipment:   Intra-op Plan:   Post-operative Plan:   Informed Consent: I have reviewed the patients History and Physical, chart, labs and discussed the procedure including the risks, benefits and alternatives for the proposed anesthesia with the patient or authorized representative who has indicated his/her understanding and acceptance.     Dental advisory given  Plan Discussed with: CRNA and Surgeon  Anesthesia Plan Comments:         Anesthesia Quick Evaluation

## 2020-02-03 NOTE — Discharge Instructions (Signed)
You are being discharged to home.  Adhere to low FODMAP diet. We are waiting for your pathology results.  Repeat colonoscopy interval to be determined once pathology result has been reviewed. Return to your GI clinic in three months. Monday - December 20 @10 :30 WITH DR. Jenetta Downer   PATIENT INSTRUCTIONS POST-ANESTHESIA  IMMEDIATELY FOLLOWING SURGERY:  Do not drive or operate machinery for the first twenty four hours after surgery.  Do not make any important decisions for twenty four hours after surgery or while taking narcotic pain medications or sedatives.  If you develop intractable nausea and vomiting or a severe headache please notify your doctor immediately.  FOLLOW-UP:  Please make an appointment with your surgeon as instructed. You do not need to follow up with anesthesia unless specifically instructed to do so.  WOUND CARE INSTRUCTIONS (if applicable):  Keep a dry clean dressing on the anesthesia/puncture wound site if there is drainage.  Once the wound has quit draining you may leave it open to air.  Generally you should leave the bandage intact for twenty four hours unless there is drainage.  If the epidural site drains for more than 36-48 hours please call the anesthesia department.  QUESTIONS?:  Please feel free to call your physician or the hospital operator if you have any questions, and they will be happy to assist you.      Colonoscopy, Adult, Care After This sheet gives you information about how to care for yourself after your procedure. Your doctor may also give you more specific instructions. If you have problems or questions, call your doctor. What can I expect after the procedure? After the procedure, it is common to have:  A small amount of blood in your poop (stool) for 24 hours.  Some gas.  Mild cramping or bloating in your belly (abdomen). Follow these instructions at home: Eating and drinking   Drink enough fluid to keep your pee (urine) pale  yellow.  Follow instructions from your doctor about what you cannot eat or drink.  Return to your normal diet as told by your doctor. Avoid heavy or fried foods that are hard to digest. Activity  Rest as told by your doctor.  Do not sit for a long time without moving. Get up to take short walks every 1-2 hours. This is important. Ask for help if you feel weak or unsteady.  Return to your normal activities as told by your doctor. Ask your doctor what activities are safe for you. To help cramping and bloating:   Try walking around.  Put heat on your belly as told by your doctor. Use the heat source that your doctor recommends, such as a moist heat pack or a heating pad. ? Put a towel between your skin and the heat source. ? Leave the heat on for 20-30 minutes. ? Remove the heat if your skin turns bright red. This is very important if you are unable to feel pain, heat, or cold. You may have a greater risk of getting burned. General instructions  For the first 24 hours after the procedure: ? Do not drive or use machinery. ? Do not sign important documents. ? Do not drink alcohol. ? Do your daily activities more slowly than normal. ? Eat foods that are soft and easy to digest.  Take over-the-counter or prescription medicines only as told by your doctor.  Keep all follow-up visits as told by your doctor. This is important. Contact a doctor if:  You have blood in your  poop 2-3 days after the procedure. Get help right away if:  You have more than a small amount of blood in your poop.  You see large clumps of tissue (blood clots) in your poop.  Your belly is swollen.  You feel like you may vomit (nauseous).  You vomit.  You have a fever.  You have belly pain that gets worse, and medicine does not help your pain. Summary  After the procedure, it is common to have a small amount of blood in your poop. You may also have mild cramping and bloating in your belly.  For the  first 24 hours after the procedure, do not drive or use machinery, do not sign important documents, and do not drink alcohol.  Get help right away if you have a lot of blood in your poop, feel like you may vomit, have a fever, or have more belly pain. This information is not intended to replace advice given to you by your health care provider. Make sure you discuss any questions you have with your health care provider. Document Revised: 11/24/2018 Document Reviewed: 11/24/2018 Elsevier Patient Education  [place].   Colon Polyps  Polyps are tissue growths inside the body. Polyps can grow in many places, including the large intestine (colon). A polyp may be a round bump or a mushroom-shaped growth. You could have one polyp or several. Most colon polyps are noncancerous (benign). However, some colon polyps can become cancerous over time. Finding and removing the polyps early can help prevent this. What are the causes? The exact cause of colon polyps is not known. What increases the risk? You are more likely to develop this condition if you:  Have a family history of colon cancer or colon polyps.  Are older than 53 or older than 45 if you are African American.  Have inflammatory bowel disease, such as ulcerative colitis or Crohn's disease.  Have certain hereditary conditions, such as: ? Familial adenomatous polyposis. ? Lynch syndrome. ? Turcot syndrome. ? Peutz-Jeghers syndrome.  Are overweight.  Smoke cigarettes.  Do not get enough exercise.  Drink too much alcohol.  Eat a diet that is high in fat and red meat and low in fiber.  Had childhood cancer that was treated with abdominal radiation. What are the signs or symptoms? Most polyps do not cause symptoms. If you have symptoms, they may include:  Blood coming from your rectum when having a bowel movement.  Blood in your stool. The stool may look dark red or black.  Abdominal pain.  A change in bowel habits,  such as constipation or diarrhea. How is this diagnosed? This condition is diagnosed with a colonoscopy. This is a procedure in which a lighted, flexible scope is inserted into the anus and then passed into the colon to examine the area. Polyps are sometimes found when a colonoscopy is done as part of routine cancer screening tests. How is this treated? Treatment for this condition involves removing any polyps that are found. Most polyps can be removed during a colonoscopy. Those polyps will then be tested for cancer. Additional treatment may be needed depending on the results of testing. Follow these instructions at home: Lifestyle  Maintain a healthy weight, or lose weight if recommended by your health care provider.  Exercise every day or as told by your health care provider.  Do not use any products that contain nicotine or tobacco, such as cigarettes and e-cigarettes. If you need help quitting, ask your health care  provider.  If you drink alcohol, limit how much you have: ? 0-1 drink a day for women. ? 0-2 drinks a day for men.  Be aware of how much alcohol is in your drink. In the [place] one drink equals one 12 oz bottle of beer (355 mL), one 5 oz glass of wine (148 mL), or one 1 oz shot of hard liquor (44 mL). Eating and drinking   Eat foods that are high in fiber, such as fruits, vegetables, and whole grains.  Eat foods that are high in calcium and vitamin D, such as milk, cheese, yogurt, eggs, liver, fish, and broccoli.  Limit foods that are high in fat, such as fried foods and desserts.  Limit the amount of red meat and processed meat you eat, such as hot dogs, sausage, bacon, and lunch meats. General instructions  Keep all follow-up visits as told by your health care provider. This is important. ? This includes having regularly scheduled colonoscopies. ? Talk to your health care provider about when you need a colonoscopy. Contact a health care provider if:  You have new  or worsening bleeding during a bowel movement.  You have new or increased blood in your stool.  You have a change in bowel habits.  You lose weight for no known reason. Summary  Polyps are tissue growths inside the body. Polyps can grow in many places, including the colon.  Most colon polyps are noncancerous (benign), but some can become cancerous over time.  This condition is diagnosed with a colonoscopy.  Treatment for this condition involves removing any polyps that are found. Most polyps can be removed during a colonoscopy. This information is not intended to replace advice given to you by your health care provider. Make sure you discuss any questions you have with your health care provider. Document Revised: 08/15/2017 Document Reviewed: 08/15/2017 Elsevier Patient Education  [facility], Adult, Care After This sheet gives you information about how to care for yourself after your procedure. Your health care provider may also give you more specific instructions. If you have problems or questions, contact your health care provider. What can I expect after the procedure? After the procedure, it is common to have:  A sore throat.  Mild stomach pain or discomfort.  Bloating.  Nausea. Follow these instructions at home:   Follow instructions from your health care provider about what to eat or drink after your procedure.  Return to your normal activities as told by your health care provider. Ask your health care provider what activities are safe for you.  Take over-the-counter and prescription medicines only as told by your health care provider.  Do not drive for 24 hours if you were given a sedative during your procedure.  Keep all follow-up visits as told by your health care provider. This is important. Contact a health care provider if you have:  A sore throat that lasts longer than one day.  Trouble swallowing. Get help right away  if:  You vomit blood or your vomit looks like coffee grounds.  You have: ? A fever. ? Bloody, black, or tarry stools. ? A severe sore throat or you cannot swallow. ? Difficulty breathing. ? Severe pain in your chest or abdomen. Summary  After the procedure, it is common to have a sore throat, mild stomach discomfort, bloating, and nausea.  Do not drive for 24 hours if you were given a sedative during the procedure.  Follow instructions from your  health care provider about what to eat or drink after your procedure.  Return to your normal activities as told by your health care provider. This information is not intended to replace advice given to you by your health care provider. Make sure you discuss any questions you have with your health care provider. Document Revised: 10/22/2017 Document Reviewed: 09/30/2017 Elsevier Patient Education  2020 [employer].    Hemorrhoids Hemorrhoids are swollen veins that may develop:  In the butt (rectum). These are called internal hemorrhoids.  Around the opening of the butt (anus). These are called external hemorrhoids. Hemorrhoids can cause pain, itching, or bleeding. Most of the time, they do not cause serious problems. They usually get better with diet changes, lifestyle changes, and other home treatments. What are the causes? This condition may be caused by:  Having trouble pooping (constipation).  Pushing hard (straining) to poop.  Watery poop (diarrhea).  Pregnancy.  Being very overweight (obese).  Sitting for long periods of time.  Heavy lifting or other activity that causes you to strain.  Anal sex.  Riding a bike for a long period of time. What are the signs or symptoms? Symptoms of this condition include:  Pain.  Itching or soreness in the butt.  Bleeding from the butt.  Leaking poop.  Swelling in the area.  One or more lumps around the opening of your butt. How is this diagnosed? A doctor can often  diagnose this condition by looking at the affected area. The doctor may also:  Do an exam that involves feeling the area with a gloved hand (digital rectal exam).  Examine the area inside your butt using a small tube (anoscope).  Order blood tests. This may be done if you have lost a lot of blood.  Have you get a test that involves looking inside the colon using a flexible tube with a camera on the end (sigmoidoscopy or colonoscopy). How is this treated? This condition can usually be treated at home. Your doctor may tell you to change what you eat, make lifestyle changes, or try home treatments. If these do not help, procedures can be done to remove the hemorrhoids or make them smaller. These may involve:  Placing rubber bands at the base of the hemorrhoids to cut off their blood supply.  Injecting medicine into the hemorrhoids to shrink them.  Shining a type of light energy onto the hemorrhoids to cause them to fall off.  Doing surgery to remove the hemorrhoids or cut off their blood supply. Follow these instructions at home: Eating and drinking   Eat foods that have a lot of fiber in them. These include whole grains, beans, nuts, fruits, and vegetables.  Ask your doctor about taking products that have added fiber (fibersupplements).  Reduce the amount of fat in your diet. You can do this by: ? Eating low-fat dairy products. ? Eating less red meat. ? Avoiding processed foods.  Drink enough fluid to keep your pee (urine) pale yellow. Managing pain and swelling   Take a warm-water bath (sitz bath) for 20 minutes to ease pain. Do this 3-4 times a day. You may do this in a bathtub or using a portable sitz bath that fits over the toilet.  If told, put ice on the painful area. It may be helpful to use ice between your warm baths. ? Put ice in a plastic bag. ? Place a towel between your skin and the bag. ? Leave the ice on for 20 minutes, 2-3  times a day. General  instructions  Take over-the-counter and prescription medicines only as told by your doctor. ? Medicated creams and medicines may be used as told.  Exercise often. Ask your doctor how much and what kind of exercise is best for you.  Go to the bathroom when you have the urge to poop. Do not wait.  Avoid pushing too hard when you poop.  Keep your butt dry and clean. Use wet toilet paper or moist towelettes after pooping.  Do not sit on the toilet for a long time.  Keep all follow-up visits as told by your doctor. This is important. Contact a doctor if you:  Have pain and swelling that do not get better with treatment or medicine.  Have trouble pooping.  Cannot poop.  Have pain or swelling outside the area of the hemorrhoids. Get help right away if you have:  Bleeding that will not stop. Summary  Hemorrhoids are swollen veins in the butt or around the opening of the butt.  They can cause pain, itching, or bleeding.  Eat foods that have a lot of fiber in them. These include whole grains, beans, nuts, fruits, and vegetables.  Take a warm-water bath (sitz bath) for 20 minutes to ease pain. Do this 3-4 times a day. This information is not intended to replace advice given to you by your health care provider. Make sure you discuss any questions you have with your health care provider. Document Revised: 05/08/2018 Document Reviewed: 09/19/2017 Elsevier Patient Education  [place].

## 2020-02-04 LAB — SURGICAL PATHOLOGY

## 2020-02-08 ENCOUNTER — Encounter (HOSPITAL_COMMUNITY): Payer: Self-pay | Admitting: Gastroenterology

## 2020-02-08 ENCOUNTER — Other Ambulatory Visit (INDEPENDENT_AMBULATORY_CARE_PROVIDER_SITE_OTHER): Payer: Self-pay | Admitting: Gastroenterology

## 2020-02-08 ENCOUNTER — Telehealth (INDEPENDENT_AMBULATORY_CARE_PROVIDER_SITE_OTHER): Payer: Self-pay | Admitting: *Deleted

## 2020-02-08 DIAGNOSIS — K219 Gastro-esophageal reflux disease without esophagitis: Secondary | ICD-10-CM

## 2020-02-08 MED ORDER — PANTOPRAZOLE SODIUM 40 MG PO TBEC: 40.0000 mg | DELAYED_RELEASE_TABLET | Freq: Every day | ORAL | 2 refills | Status: DC

## 2020-02-08 NOTE — Telephone Encounter (Signed)
I called and spoke with the patient she mentioned she was having vision loss, when I asked when this started she stated a year and a half ago with the latest bout a month and a half ago. I advised that she needed to get into see her Primary care as soon as possible to have the eye sight issue evaluated. She states that after her colonoscopy/endoscopy you had told her to increase a medication, which she states she was out of it and did not remember what you had instructed. She thinks it may have been the linzess,but unsure. She also states her reflux is worse than before and she is taking the protonix and unsure if she should increase this. Please advise.

## 2020-02-08 NOTE — Telephone Encounter (Signed)
Patient called - has questions about IBS - please call [phone]

## 2020-02-08 NOTE — Telephone Encounter (Signed)
Spoke with the patient regarding her symptoms. Explained again the importance of following the low FODMAP diet as well as taking Linzess 72 mcg qday. Also I advised her to increase the pantoprazole to 40 mg for heartburn symptoms. She understood and agreed. I reinforced the importance of following with psychiatry to improve her mental heatlh.  [name], MD Gastroenterology and Hepatology [facility]

## 2020-02-09 NOTE — Telephone Encounter (Signed)
Noted  

## 2020-02-15 ENCOUNTER — Telehealth: Payer: Self-pay | Admitting: Allergy & Immunology

## 2020-02-15 NOTE — Telephone Encounter (Signed)
Please advise 

## 2020-02-15 NOTE — Telephone Encounter (Signed)
Patient called to cancel appointment because she does not have allergies and the medication she was prescribed is making her nauseous instead of making her drowsy. Patient would like to know if there is any other recommendations.  Please advise.

## 2020-02-16 NOTE — Telephone Encounter (Signed)
Spoke with patient and informed her of Dr. Gillermina Hu recommendation. Patient verbalized understanding. Patient stated that she wanted to thank Dr. Ernst Bowler very much for referring her to that GI doctor. She was able to get in sooner to see him and has already had a procedure done which has helped. She stated that she found out that she has IBS with constipation and diarrhea and really bad acid reflex. They have her on a strict fodmap diet which has help.

## 2020-02-16 NOTE — Telephone Encounter (Signed)
I agree - she does not have allergies so I do not think that we need to see her routinely. She is already plugged in with GI, so I would have her reach out to them for recommendations. She can definitely stop the medications that we started her on since they have not helped.  Salvatore Marvel, MD Allergy and [place] [facility] [place]

## 2020-02-17 ENCOUNTER — Telehealth (INDEPENDENT_AMBULATORY_CARE_PROVIDER_SITE_OTHER): Payer: Self-pay | Admitting: *Deleted

## 2020-02-17 ENCOUNTER — Ambulatory Visit (HOSPITAL_COMMUNITY): Payer: Medicare Other | Admitting: Clinical

## 2020-02-17 ENCOUNTER — Other Ambulatory Visit: Payer: Self-pay

## 2020-02-17 NOTE — Telephone Encounter (Signed)
Please call patient [phone] - she is having issues with IBS diet - having a hard time handling diet

## 2020-02-17 NOTE — Telephone Encounter (Signed)
Noted. CLS 02/17/2020

## 2020-02-17 NOTE — Telephone Encounter (Signed)
I spoke to the patient today, she reported she was feeling me several after implementing a low FODMAP diet which has not provided any improvement for her symptoms but has only lead to further weight loss. I told her to advance her diet as tolerated and to take the food that she feels does not worsen her symptoms. Overall her symptoms are likely related to uncontrolled psychiatric disease, for which I encouraged her to follow with her psychiatrist as soon as possible. The patient understood and agreed.  [name], MD Gastroenterology and Hepatology [facility]

## 2020-02-17 NOTE — Telephone Encounter (Signed)
I spoke with the patient she states this diet is stressing her out and she has lost weight due to not wanting to eat because she is scared to eat. I advised her that the diet was to trying to figure out what her intolerances to certain foods were. An elimination diet where she could add things back in to see which foods were causing her GI issues. She states she had been tested by her allergist and they did not find any allergies to any foods. I told her she may not be allergic to the foods that it was more of an intolerance to the foods that her GI tract did not like.I told her to not let this diet stress her out,that she may want to reach out to her physiatrist to see if they could help her through this mentally. She wants to know if there is something else that could be done. Please advise.

## 2020-02-23 ENCOUNTER — Telehealth (INDEPENDENT_AMBULATORY_CARE_PROVIDER_SITE_OTHER): Payer: Self-pay

## 2020-02-23 NOTE — Telephone Encounter (Signed)
Patient called today and states she was taking Linzess 72 mg for constipation it has helped at first get rid of the hardened stools, but now she has been experiencing diarrhea for the last few days. Please advise.

## 2020-02-23 NOTE — Telephone Encounter (Signed)
Call patient to discuss her symptoms, she did not pick up the phone, I left a detailed voice message stating that if she is having less than 4 bowel movements (if any watery) and no fecal soiling is suspected that Linzess can cause that and usually it gets better after a few weeks of taking the medication.  If she is having significant amount of bowel movements she can decrease the intake to every other day.  I called her husband as well and informed him about this who understood and agreed.  He told me he would let her know about the message.  [name], MD Gastroenterology and Hepatology [facility]

## 2020-02-24 ENCOUNTER — Telehealth (INDEPENDENT_AMBULATORY_CARE_PROVIDER_SITE_OTHER): Payer: Self-pay | Admitting: Gastroenterology

## 2020-02-24 ENCOUNTER — Telehealth (INDEPENDENT_AMBULATORY_CARE_PROVIDER_SITE_OTHER): Payer: Self-pay | Admitting: *Deleted

## 2020-02-24 NOTE — Telephone Encounter (Signed)
Spoke with patient, please see message from today.

## 2020-02-24 NOTE — Telephone Encounter (Signed)
sever pain & vomiting, don't know what to do

## 2020-02-24 NOTE — Telephone Encounter (Signed)
Patient reports feeling extremely anxious and nervous, states that after this her nausea and vomiting episodes worsened and also has been presenting persistent episodes of diarrhea, up to 6/day.  She has been taking the Linzess every day.  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  She reported she is currently imaged as a pain but no other medications have been added or increased as her "psychiatrist is not in the city and she is on vacation".  She does not know what to do next.  I spoke to her PCP Loletha Grayer ) and inform him about the situation.  I asked him if they can find out where to refer her for acute on chronic anxiety, she may need to be evaluated and hospitalized for treatment of this condition.  For now, from my side I recommended her to restart taking her Zofran and also take the Linzess every other day instead of daily dosing.  Patient understood and agreed.  [name], MD Gastroenterology and Hepatology [facility]

## 2020-02-24 NOTE — Telephone Encounter (Signed)
I spoke with the patient and she states she knows it is her mental condition that is causing her to be afraid to eat and after eating it makes her vomit. She states she does not know what to do. I transferred the patient to Dr. Jenetta Downer who addressed these issues with the patient.

## 2020-02-27 ENCOUNTER — Other Ambulatory Visit: Payer: Self-pay

## 2020-02-27 ENCOUNTER — Ambulatory Visit (HOSPITAL_COMMUNITY)
Admission: EM | Admit: 2020-02-27 | Discharge: 2020-02-27 | Disposition: A | Discharge status: Home / Self Care | Payer: Medicare Other | Attending: Psychiatry | Admitting: Psychiatry

## 2020-02-27 DIAGNOSIS — F429 Obsessive-compulsive disorder, unspecified: Secondary | ICD-10-CM | POA: Insufficient documentation

## 2020-02-27 DIAGNOSIS — F4310 Post-traumatic stress disorder, unspecified: Secondary | ICD-10-CM | POA: Diagnosis not present

## 2020-02-27 DIAGNOSIS — F410 Panic disorder [episodic paroxysmal anxiety] without agoraphobia: Secondary | ICD-10-CM | POA: Diagnosis not present

## 2020-02-27 DIAGNOSIS — F32A Depression, unspecified: Secondary | ICD-10-CM | POA: Diagnosis not present

## 2020-02-27 DIAGNOSIS — F411 Generalized anxiety disorder: Secondary | ICD-10-CM | POA: Diagnosis present

## 2020-02-27 DIAGNOSIS — F331 Major depressive disorder, recurrent, moderate: Secondary | ICD-10-CM | POA: Diagnosis not present

## 2020-02-27 DIAGNOSIS — G4700 Insomnia, unspecified: Secondary | ICD-10-CM | POA: Diagnosis not present

## 2020-02-27 MED ORDER — RISPERIDONE 0.5 MG PO TABS: 0.5000 mg | ORAL_TABLET | Freq: Every day | ORAL | 0 refills | Status: DC

## 2020-02-27 MED ORDER — RISPERIDONE 0.25 MG PO TABS: 0.2500 mg | ORAL_TABLET | Freq: Two times a day (BID) | ORAL | 0 refills | Status: DC

## 2020-02-27 NOTE — ED Provider Notes (Signed)
Behavioral Health Urgent Care Medical Screening Exam  Patient Name: Dawn Kelley MRN: [id number] Date of Evaluation: 02/27/20 Chief Complaint: Chief Complaint/Presenting Problem: Dawn Kelley is a 59 yo female presenting to [facility] as a walk in to address escalating anxiety and breakthrough depression. Pt reports that she has been to several ED's locally seeking services. Pt reports that she would like inpatient services, but is fearful of the contained enviroments typical of behavioral health units. Pt reports that she has symptoms of restlessness, panic attacks, feeling nervous, tremors, claustrophobia, racing thoughts, and insomnia. Pt reports that she is under the care of a psychiatrist and is supposed to be seeing a counselor, but she missed her appt with him. Pt has done extensive internet research about anxiety and mental health treatments, and is trying to manage symptoms on her own. Pt reports treatments in the past for: depression, anxiety, PTSD, OCD, and significant GI issues that her doctors feel are triggered by anxiety. Pt has tried CBT, guided imagery, progressive muscle relaxation, and deep breathing. Pt feels nothing has been helpful.  Pt denies any SI, HI, or AVH at time of assessment. Pt reports that she is not a danger to herself or others. Pt currently lives with her husband and her 3 children reside in [place].  Dawn Kelley's affective and emotional state appeared tired, distressed, withdrawn, nervous and slightly agitated. Pts mental state included fair insight and observing capacityand judgement. Overall pt reports that she wants to feel better and is looking for mental health supports. Diagnosis:  Final diagnoses:  Generalized anxiety disorder  Panic attacks    History of Present illness: Dawn Kelley is a 58 y.o. female.  Patient presents as a walk-in voluntarily reporting severe anxiety.  Patient goes to a lengthy history of seeing her PCP, gastroenterologist, and her psychiatrist due  to her severe anxiety.  She states that this started in March.  Patient has been on multiple medications and based off report has been admitted in the hospital in the past.  Due to her severe concerns of her anxiety that has prevented her from leaving the house and going to the grocery store to have it being fearful of different things.  The patient has been prescribed Klonopin 1 mg twice daily on 02/12/2020, then that was discontinued and the patient was started on Ativan 1 mg 3 times daily on 02/16/2020, then that was discontinued and the patient was placed on Xanax 1 mg 4 times a day filled on 02/24/2020.  Patient presents with a [employer] report showing which medications are appropriate for her.  She states that she feels that the Xanax is not working for her even though it is a medication listed that should be a great medication for her.  She states that she was prescribed Remeron 15 mg p.o. nightly but shows me on her gene site that this is not an approved medication for her treatment.  We discussed educated and discussed the medications as far as why they would be useful to her.  Patient had concerns and complaints as far as her vomiting, diarrhea, IBS, GERD, anxiety, and depression at times.  Informed her that mirtazapine can assist with several of those symptoms that she is presenting with and that I can understand why this medication was started.  She states that she is going to follow-up with Dr. Meta Hatchet next week when she returns from vacation.  After discussing medications with her we had talked about the patient starting Seroquel however the patient shows an allergy  to Seroquel for shortness of breath but the patient states that she is unsure of that allergy and does not remember that.  However due to concerns for allergy decided to start patient on low-dose of Risperdal.  Gave patient prescription for Risperdal 0.25 mg p.o. twice daily at 8 AM and 4 PM and 0.5 mg p.o. nightly.  Patient has  reported little to no sleep for the last 2 days with changes in stories sounds like patient may have received approximately 2 to 3 hours each night.  Based off the chart review from patient's other visits there may be a concern for patient having bipolar disorder due to her severe anxiety, history of spending Dawn Kelley erratically, poor sleep, and mood instability.  This is a very low dose of Risperdal in an attempt to assist with patient's mood, anxiety, and sleep.  Patient has denied having any suicidal or homicidal ideations and denies any hallucinations.  Patient also denies having any thoughts of self-harm.  Patient does not appear to be responding to internal or external stimuli.  Patient does appear to be very anxious, however based off the chart review this is been the patient's presentation for several months now.  Patient denies having any types of medical problems or concerns other than her GERD and IBS.  Psychiatric Specialty Exam  Presentation  General Appearance:Appropriate for Environment;Casual  Eye Contact:Good  Speech:Clear and Coherent;Normal Rate  Speech Volume:Normal  Handedness:Right   Mood and Affect  Mood:Anxious  Affect:Congruent   Thought Process  Thought Processes:Coherent  Descriptions of Associations:Intact  Orientation:Full (Time, Place and Person)  Thought Content:WDL  Hallucinations:None  Ideas of Reference:None  Suicidal Thoughts:No  Homicidal Thoughts:No   Sensorium  Memory:Immediate Good;Recent Good;Remote Good  Judgment:Good  Insight:Good   Executive Functions  Concentration:Good  Attention Span:Good  Recall:Good  Fund of Knowledge:Good  Language:Good   Psychomotor Activity  Psychomotor Activity:Normal   Assets  Assets:Communication Skills;Desire for Improvement;Financial Resources/Insurance;Housing;Social Support;Transportation   Sleep  Sleep:Poor  Number of hours: No data recorded  Physical Exam: Physical  Exam Vitals and nursing note reviewed.  Constitutional:      Appearance: She is well-developed.  HENT:     Head: Normocephalic.  Eyes:     Pupils: Pupils are equal, round, and reactive to light.  Cardiovascular:     Rate and Rhythm: Normal rate.  Pulmonary:     Effort: Pulmonary effort is normal.  Musculoskeletal:        General: Normal range of motion.  Neurological:     Mental Status: She is alert and oriented to person, place, and time.  Psychiatric:        Mood and Affect: Mood is anxious.    Review of Systems  Constitutional: Negative.   HENT: Negative.   Eyes: Negative.   Respiratory: Negative.   Cardiovascular: Negative.   Gastrointestinal: Negative.   Genitourinary: Negative.   Musculoskeletal: Negative.   Skin: Negative.   Neurological: Negative.   Endo/Heme/Allergies: Negative.   Psychiatric/Behavioral: Positive for depression. The patient is nervous/anxious.    Blood pressure 136/86, pulse 74, temperature 97.7 F (36.5 C), temperature source Temporal, resp. rate 16, SpO2 100 %. There is no height or weight on file to calculate BMI.  Musculoskeletal: Strength & Muscle Tone: within normal limits Gait & Station: normal Patient leans: N/A   [place] MSE Discharge Disposition for Follow up and Recommendations: Based on my evaluation the patient does not appear to have an emergency medical condition and can be discharged with resources and  follow up care in outpatient services for Medication Management and [place], FNP 02/27/2020, 6:57 PM

## 2020-02-27 NOTE — ED Notes (Signed)
LOCKER #[id number]
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
## 2020-02-27 NOTE — Discharge Instructions (Signed)

## 2020-02-27 NOTE — Progress Notes (Signed)
Dawn Kelley received her AVS, prescriptions and her questions answered. She was escorted to retrieve her personal belongings and to the lobby without incident.

## 2020-02-29 ENCOUNTER — Telehealth (INDEPENDENT_AMBULATORY_CARE_PROVIDER_SITE_OTHER): Payer: Self-pay | Admitting: *Deleted

## 2020-02-29 NOTE — Telephone Encounter (Signed)
If she has breakthrough episodes of hearburn, she can take the Pepcid, specially at night.  Thanks,  [name], MD Gastroenterology and Hepatology [facility]

## 2020-02-29 NOTE — Telephone Encounter (Signed)
Patient is aware of all. CLS

## 2020-02-29 NOTE — Telephone Encounter (Signed)
Patient left message - acid reflux or gerd (she's not sure which she has) is so bad she can't calm down - wants to know if she needs different meds please call [phone]

## 2020-02-29 NOTE — Telephone Encounter (Signed)
spoke with the patient and she states she is still having issue with her gerd. She states she spoke with the pharmacist whom told her with IBS and Dawn Kelley her medications may not be working correctly as they be dissolving before the get into her system. She states she is taking her Protonix daily, and tums prn and she is not taking the famotidine at this time.

## 2020-03-01 ENCOUNTER — Telehealth (HOSPITAL_COMMUNITY): Payer: Self-pay | Admitting: Psychiatry

## 2020-03-01 ENCOUNTER — Telehealth (INDEPENDENT_AMBULATORY_CARE_PROVIDER_SITE_OTHER): Payer: Self-pay | Admitting: Gastroenterology

## 2020-03-01 NOTE — Telephone Encounter (Signed)
Patient has called in continuously advising she is having a mental break down. Patient was advised to go to [facility] hospital or any emergency dept. Pt advised she went to both but was not accepted due to not wanting to harm herself. Pt seems very distressed every time she has called in. Pt ws reminded of her NP appt with Dr. Modesta Messing and also Maye Hides

## 2020-03-01 NOTE — Telephone Encounter (Signed)
Dr Thornell Sartorius from [facility] would like a call back regarding this patients care - ph# [phone]

## 2020-03-02 ENCOUNTER — Ambulatory Visit (HOSPITAL_COMMUNITY): Payer: Medicare Other | Admitting: Clinical

## 2020-03-02 ENCOUNTER — Other Ambulatory Visit: Payer: Self-pay

## 2020-03-02 NOTE — Telephone Encounter (Signed)
I tried reaching Dr. Nyoka Cowden to the phone number on file but the call was sent to voicemail.  I left her a detailed voice message to call me back the office or to my personal phone.

## 2020-03-04 ENCOUNTER — Ambulatory Visit: Payer: Medicare Other | Admitting: Allergy & Immunology

## 2020-03-08 ENCOUNTER — Telehealth (INDEPENDENT_AMBULATORY_CARE_PROVIDER_SITE_OTHER): Payer: Self-pay

## 2020-03-08 NOTE — Telephone Encounter (Signed)
Patient husband Dawn Kelley called this evening he reports that Dawn Kelley is in a [facility] in [place]. He state the patient has lost around 40 lbs because she has focused so much on the FODMAP diet that neither the patient nor himself know what to eat or he what to feed her,so she is there for not eating. I advised that I would notify Dr. Jenetta Downer once he returns later this week to see if there is anything he recommends regarding the diet.

## 2020-03-10 NOTE — Telephone Encounter (Signed)
Spoke with patient regarding the diet. She can liberalize her diet as tolerated as she May benefit from low FODMAP diet up to some point but I want her to restrict her intake substantially and continue losing weight.  The patient understood and agreed.  She will attempt to increase the intake of different kind of food that can improve her symptomatology and that she likes.  [name], MD Gastroenterology and Hepatology [facility]

## 2020-03-11 ENCOUNTER — Ambulatory Visit: Payer: Medicare Other | Admitting: Gastroenterology

## 2020-03-15 ENCOUNTER — Encounter (INDEPENDENT_AMBULATORY_CARE_PROVIDER_SITE_OTHER): Payer: Self-pay

## 2020-03-31 DIAGNOSIS — F5104 Psychophysiologic insomnia: Secondary | ICD-10-CM | POA: Insufficient documentation

## 2020-03-31 NOTE — Progress Notes (Deleted)
Psychiatric Initial Adult Assessment   Patient Identification: Dawn Kelley MRN:  [id number] Date of Evaluation:  03/31/2020 Referral Source: *** Chief Complaint:   Visit Diagnosis: No diagnosis found.  History of Present Illness:   Dawn Kelley is a 59 y.o. year old female with a history of bipolar depression, PTSD, GAD, panic attacks , who is referred to establish care.     Per chart review, she was admitted to [facility] on 11/3-5 for worsening in anxiety, and "mania" with insomnia. She was discharged on Depakote 750mg , propranolol 60mg ,risperidone 0.5mg , mirtazapine 15 mg qhs, clonazepam 0,5 mg bid/1 mg bid, hydroxyzine 50mg ,   Associated Signs/Symptoms: Depression Symptoms:  {DEPRESSION SYMPTOMS:20000} (Hypo) Manic Symptoms:  {BHH MANIC SYMPTOMS:22872} Anxiety Symptoms:  {BHH ANXIETY SYMPTOMS:22873} Psychotic Symptoms:  {BHH PSYCHOTIC SYMPTOMS:22874} PTSD Symptoms: {BHH PTSD SYMPTOMS:22875}  Past Psychiatric History:  Outpatient:  Psychiatry admission:  Previous suicide attempt:  Past trials of medication:  History of violence:   Previous Psychotropic Medications: {YES/NO:21197}  Substance Abuse History in the last 12 months:  {yes no:314532}  Consequences of Substance Abuse: {BHH CONSEQUENCES OF SUBSTANCE ABUSE:22880}  Past Medical History:  Past Medical History:  Diagnosis Date  . Anxiety   . Bipolar 1 disorder ([place])   . OCD (obsessive compulsive disorder)   . PTSD (post-traumatic stress disorder)     Past Surgical History:  Procedure Laterality Date  . BIOPSY  02/03/2020   Procedure: BIOPSY;  Surgeon: Harvel Quale, MD;  Location: [facility];  Service: Gastroenterology;;  . COLONOSCOPY WITH PROPOFOL N/A 02/03/2020   Procedure: COLONOSCOPY WITH PROPOFOL;  Surgeon: Harvel Quale, MD;  Location: [facility];  Service: Gastroenterology;  Laterality: N/A;  830  . ESOPHAGOGASTRODUODENOSCOPY (EGD) WITH PROPOFOL N/A 02/03/2020    Procedure: ESOPHAGOGASTRODUODENOSCOPY (EGD) WITH PROPOFOL;  Surgeon: Harvel Quale, MD;  Location: [facility];  Service: Gastroenterology;  Laterality: N/A;  . INDUCED ABORTION N/A 1980  . POLYPECTOMY  02/03/2020   Procedure: POLYPECTOMY;  Surgeon: Harvel Quale, MD;  Location: [facility];  Service: Gastroenterology;;  . VEIN SURGERY      Family Psychiatric History: ***  Family History:  Family History  Problem Relation Age of Onset  . Depression Mother   . Pneumonia Mother     Social History:   Social History   Socioeconomic History  . Marital status: Married    Spouse name: Not on file  . Number of children: Not on file  . Years of education: Not on file  . Highest education level: Not on file  Occupational History  . Occupation: Disabled  Tobacco Use  . Smoking status: Never Smoker  . Smokeless tobacco: Never Used  Substance and Sexual Activity  . Alcohol use: Not Currently  . Drug use: Not Currently  . Sexual activity: Yes  Other Topics Concern  . Not on file  Social History Narrative   Pt lives in [place] with husband.  She is on disability.  Pt stated that she receives outpatient psychiatry services through [facility].  She does not receive outpatient therapy.   Social Determinants of Health   Financial Resource Strain:   . Difficulty of Paying Living Expenses: Not on file  Food Insecurity:   . Worried About Charity fundraiser in the Last Year: Not on file  . Ran Out of Food in the Last Year: Not on file  Transportation Needs:   . Lack of Transportation (Medical): Not on file  . Lack of Transportation (Non-Medical):  Not on file  Physical Activity:   . Days of Exercise per Week: Not on file  . Minutes of Exercise per Session: Not on file  Stress:   . Feeling of Stress : Not on file  Social Connections:   . Frequency of Communication with Friends and Family: Not on file  . Frequency of Social Gatherings with Friends  and Family: Not on file  . Attends Religious Services: Not on file  . Active Member of Clubs or Organizations: Not on file  . Attends Archivist Meetings: Not on file  . Marital Status: Not on file    Additional Social History: ***  Allergies:   Allergies  Allergen Reactions  . Escitalopram Nausea And Vomiting    Tolerated citalopram with out difficulty for many years.  Marland Kitchen Seroquel [Quetiapine Fumarate] Shortness Of Breath    Metabolic Disorder Labs: No results found for: HGBA1C, MPG No results found for: PROLACTIN No results found for: CHOL, TRIG, HDL, CHOLHDL, VLDL, LDLCALC No results found for: TSH  Therapeutic Level Labs: No results found for: LITHIUM No results found for: CBMZ No results found for: VALPROATE  Current Medications: Current Outpatient Medications  Medication Sig Dispense Refill  . calcium carbonate (TUMS EX) 750 MG chewable tablet Chew 1 tablet by mouth daily as needed for heartburn.     . famotidine (PEPCID) 20 MG tablet Take 20 mg by mouth daily.     . fluticasone (FLONASE) 50 MCG/ACT nasal spray Place 1 spray into both nostrils daily as needed for allergies.     . hydrOXYzine (ATARAX/VISTARIL) 50 MG tablet Take 1 tablet (50 mg total) by mouth at bedtime. 90 tablet 2  . linaclotide (LINZESS) 72 MCG capsule Take 1 capsule (72 mcg total) by mouth daily. 30 capsule 2  . Melatonin 5 MG CAPS Take 5 mg by mouth at bedtime as needed (Sleep).     . naphazoline-pheniramine (ALLERGY EYE) 0.025-0.3 % ophthalmic solution Place 1 drop into both eyes daily as needed for eye irritation.    Marland Kitchen nystatin cream (MYCOSTATIN) Apply 1 application topically daily as needed (Rash).     . ondansetron (ZOFRAN) 4 MG tablet Take 4 mg by mouth at bedtime.     . pantoprazole (PROTONIX) 40 MG tablet Take 1 tablet (40 mg total) by mouth daily. 90 tablet 2  . risperiDONE (RISPERDAL) 0.25 MG tablet Take 1 tablet (0.25 mg total) by mouth 2 (two) times daily. 1 tab at 8 am and 1 tab  at 4 pm 60 tablet 0  . risperiDONE (RISPERDAL) 0.5 MG tablet Take 1 tablet (0.5 mg total) by mouth at bedtime. 30 tablet 0  . Vitamin D, Ergocalciferol, (DRISDOL) 1.25 MG (50000 UNIT) CAPS capsule Take 50,000 Units by mouth every Sunday.     No current facility-administered medications for this visit.    Musculoskeletal: Strength & Muscle Tone: N/A Gait & Station: N/A Patient leans: N/A  Psychiatric Specialty Exam: Review of Systems  There were no vitals taken for this visit.There is no height or weight on file to calculate BMI.  General Appearance: {Appearance:22683}  Eye Contact:  {BHH EYE CONTACT:22684}  Speech:  Clear and Coherent  Volume:  Normal  Mood:  {BHH MOOD:22306}  Affect:  {Affect (PAA):22687}  Thought Process:  Coherent  Orientation:  Full (Time, Place, and Person)  Thought Content:  Logical  Suicidal Thoughts:  {ST/HT (PAA):22692}  Homicidal Thoughts:  {ST/HT (PAA):22692}  Memory:  Immediate;   Good  Judgement:  {Judgement (PAA):22694}  Insight:  {Insight (PAA):22695}  Psychomotor Activity:  Normal  Concentration:  Concentration: Good and Attention Span: Good  Recall:  Good  Fund of Knowledge:Good  Language: Good  Akathisia:  No  Handed:  Right  AIMS (if indicated):  not done  Assets:  Communication Skills Desire for Improvement  ADL's:  Intact  Cognition: WNL  Sleep:  {BHH GOOD/FAIR/POOR:22877}   Screenings: GAD-7     ED from 02/27/2020 in [facility]  Total GAD-7 Score 21    PHQ2-9     ED from 02/27/2020 in [facility]  PHQ-2 Total Score 6  PHQ-9 Total Score 27      Assessment and Plan:  Assessment  Plan  The patient demonstrates the following risk factors for suicide: Chronic risk factors for suicide include: {Chronic Risk Factors for GZFPOIP:18984210}. Acute risk factors for suicide include: {Acute Risk Factors for ZXYOFVW:86773736}. Protective factors for this patient include:  {Protective Factors for Suicide KKDP:94707615}. Considering these factors, the overall suicide risk at this point appears to be {Desc; low/moderate/high:110033}. Patient {ACTION; IS/IS HID:43735789} appropriate for outpatient follow up.     Norman Clay, MD 11/18/20212:05 PM

## 2020-04-06 ENCOUNTER — Encounter (INDEPENDENT_AMBULATORY_CARE_PROVIDER_SITE_OTHER): Payer: Self-pay | Admitting: *Deleted

## 2020-04-12 ENCOUNTER — Telehealth: Payer: Medicare Other | Admitting: Psychiatry

## 2020-04-12 ENCOUNTER — Telehealth (HOSPITAL_COMMUNITY): Payer: Medicare Other | Admitting: Psychiatry

## 2020-04-13 ENCOUNTER — Telehealth (INDEPENDENT_AMBULATORY_CARE_PROVIDER_SITE_OTHER): Payer: Self-pay

## 2020-04-13 NOTE — Telephone Encounter (Signed)
Spoke with the patient today regarding her new symptoms, states that she had new onset of multiple watery bowel movements up to 11 today.  She felt relatively well until yesterday.  Denies having any melena, hematochezia, abdominal pain or bloating, nausea or vomiting.  I explained to the patient that her symptoms could be related to transient viral infection has the have only lasted 24 hours, for which she will need to stay hydrated.  I advised her to call back next week if she persisted with diarrhea and we will have to check for C. difficile and a GI pathogen panel if her symptoms worsen or persist.  She understood and agreed.  Patient was very nervous as she reported that she will need to have electroconvulsive therapy for her refractory depression.  This could be contributing to her worsening symptoms.  [name], MD Gastroenterology and Hepatology [facility]

## 2020-04-13 NOTE — Telephone Encounter (Signed)
Patient called today stating she has had diarrhea on going since yesterday. She is still on the [employer] as she states when she came off of it she paid for it. She states she has no fever, no nausea, no vomiting, no blood in stools. She states she has had around 11-12 BM's since yesterday. She states she has been getting plenty of fluids so she does not become dehydrated. [phone]

## 2020-04-14 ENCOUNTER — Other Ambulatory Visit: Payer: Self-pay

## 2020-04-14 ENCOUNTER — Emergency Department (HOSPITAL_COMMUNITY)
Admission: EM | Admit: 2020-04-14 | Discharge: 2020-04-14 | Discharge status: Against Medical Advice | Payer: Medicare Other

## 2020-04-15 ENCOUNTER — Encounter (HOSPITAL_COMMUNITY): Payer: Self-pay

## 2020-04-15 ENCOUNTER — Emergency Department (HOSPITAL_COMMUNITY): Payer: Medicare Other

## 2020-04-15 ENCOUNTER — Other Ambulatory Visit: Payer: Self-pay

## 2020-04-15 ENCOUNTER — Emergency Department (HOSPITAL_COMMUNITY)
Admission: EM | Admit: 2020-04-15 | Discharge: 2020-04-15 | Disposition: A | Discharge status: Home / Self Care | Payer: Medicare Other | Attending: Emergency Medicine | Admitting: Emergency Medicine

## 2020-04-15 DIAGNOSIS — R202 Paresthesia of skin: Secondary | ICD-10-CM | POA: Insufficient documentation

## 2020-04-15 LAB — CBC WITH DIFFERENTIAL/PLATELET
Abs Immature Granulocytes: 0.02 10*3/uL (ref 0.00–0.07)
Basophils Absolute: 0.1 10*3/uL (ref 0.0–0.1)
Basophils Relative: 1 %
Eosinophils Absolute: 0 10*3/uL (ref 0.0–0.5)
Eosinophils Relative: 0 %
HCT: 40 % (ref 36.0–46.0)
Hemoglobin: 13.2 g/dL (ref 12.0–15.0)
Immature Granulocytes: 0 %
Lymphocytes Relative: 31 %
Lymphs Abs: 2 10*3/uL (ref 0.7–4.0)
MCH: 29.7 pg (ref 26.0–34.0)
MCHC: 33 g/dL (ref 30.0–36.0)
MCV: 90.1 fL (ref 80.0–100.0)
Monocytes Absolute: 0.4 10*3/uL (ref 0.1–1.0)
Monocytes Relative: 6 %
Neutro Abs: 3.8 10*3/uL (ref 1.7–7.7)
Neutrophils Relative %: 62 %
Platelets: 255 10*3/uL (ref 150–400)
RBC: 4.44 MIL/uL (ref 3.87–5.11)
RDW: 12.9 % (ref 11.5–15.5)
WBC: 6.2 10*3/uL (ref 4.0–10.5)
nRBC: 0 % (ref 0.0–0.2)

## 2020-04-15 LAB — COMPREHENSIVE METABOLIC PANEL
ALT: 16 U/L (ref 0–44)
AST: 15 U/L (ref 15–41)
Albumin: 3.6 g/dL (ref 3.5–5.0)
Alkaline Phosphatase: 61 U/L (ref 38–126)
Anion gap: 9 (ref 5–15)
BUN: 16 mg/dL (ref 6–20)
CO2: 27 mmol/L (ref 22–32)
Calcium: 9 mg/dL (ref 8.9–10.3)
Chloride: 103 mmol/L (ref 98–111)
Creatinine, Ser: 0.52 mg/dL (ref 0.44–1.00)
GFR, Estimated: >60 mL/min (ref >60)
Glucose, Bld: 97 mg/dL (ref 70–99)
Potassium: 3.7 mmol/L (ref 3.5–5.1)
Sodium: 139 mmol/L (ref 135–145)
Total Bilirubin: 0.3 mg/dL (ref 0.3–1.2)
Total Protein: 7.2 g/dL (ref 6.5–8.1)

## 2020-04-15 IMAGING — MR MR CERVICAL SPINE W/O CM
5 series · 39 of 48 positions shown · non-contrast
Comparison: Concurrent MRI head.

CLINICAL DATA: Cervical radiculopathy, infection suspected

EXAM:
MRI CERVICAL SPINE WITHOUT CONTRAST
TECHNIQUE: Multiplanar, multisequence MR imaging of the cervical spine was
performed. No intravenous contrast was administered.

[Series 5: T2 · sagittal · 3.0mm · 0.69mm/px · 6 of 15 slices shown (1 of 2)]
[im 1/15]
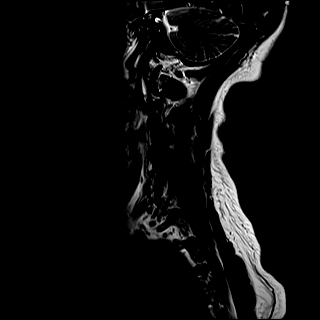
[im 3/15]
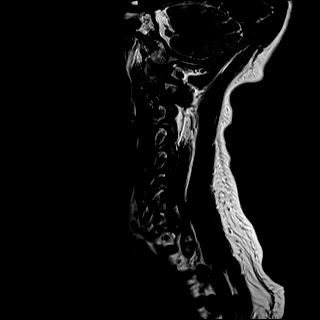
[im 6/15]
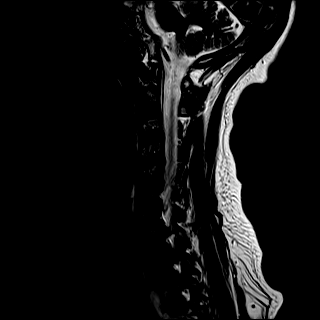
[im 9/15]
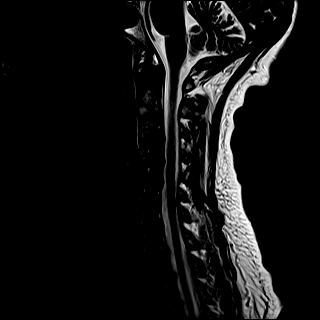
[im 12/15]
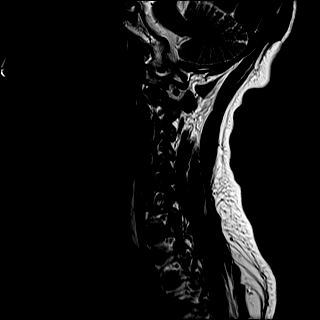
[im 15/15]
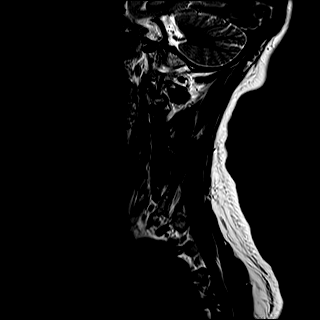

[Series 6: T1 · sagittal · 3.0mm · 0.86mm/px · 7 of 15 slices shown]
[im 1/15]
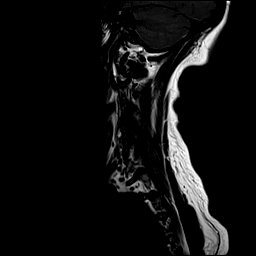
[im 3/15]
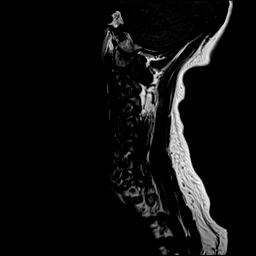
[im 5/15]
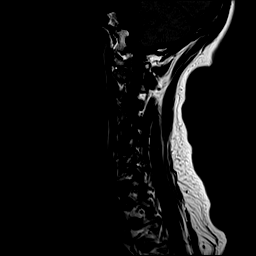
[im 8/15]
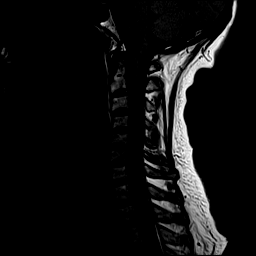
[im 10/15]
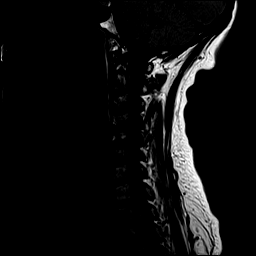
[im 12/15]
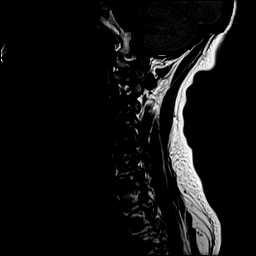
[im 15/15]
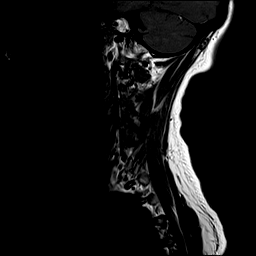

[Series 7: STIR · sagittal · 3.0mm · 0.69mm/px · 7 of 15 slices shown]
[im 1/15]
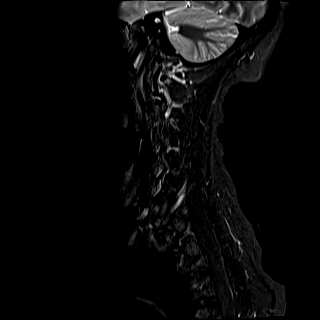
[im 3/15]
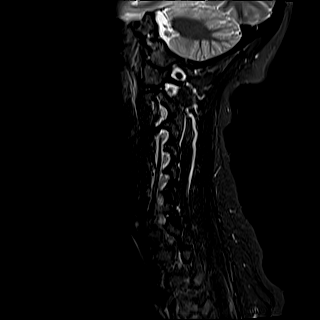
[im 5/15]
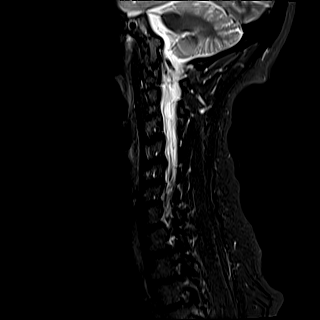
[im 8/15]
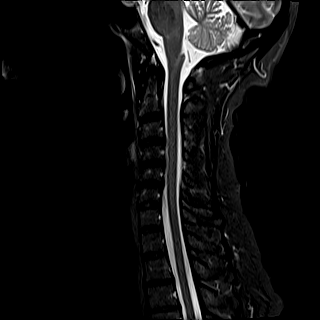
[im 10/15]
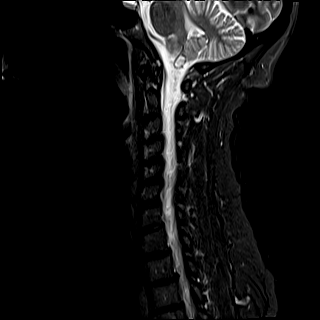
[im 12/15]
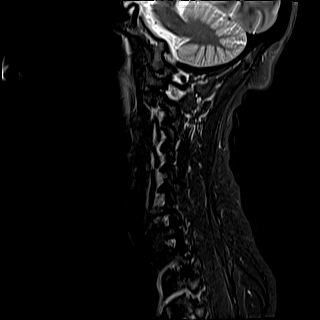
[im 15/15]
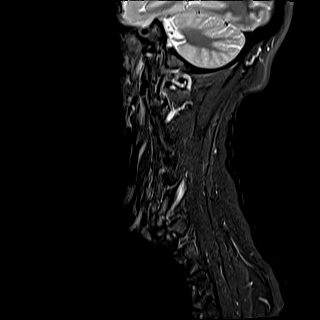

[Series 8: T2 · axial · 3.0mm · 0.70mm/px · z∈[-171,-75]mm · 11 of 30 slices shown (2 of 2)]
[im 1/30]
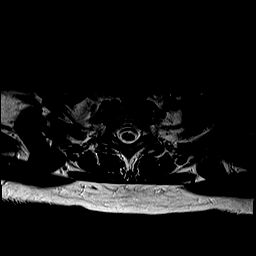
[im 3/30]
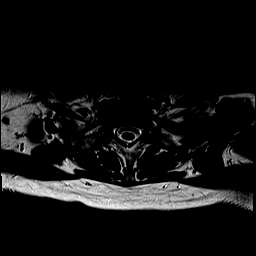
[im 5/30]
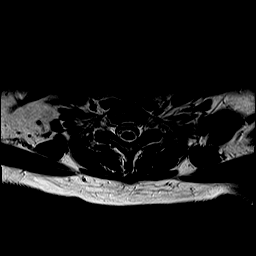
[im 7/30]
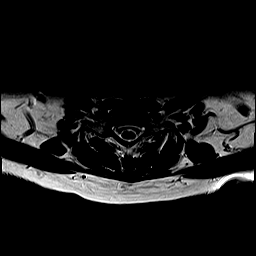
[im 9/30]
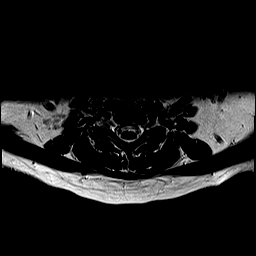
[im 12/30]
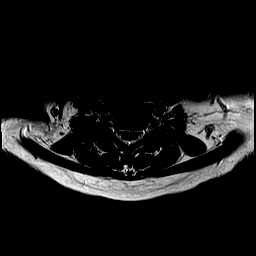
[im 14/30]
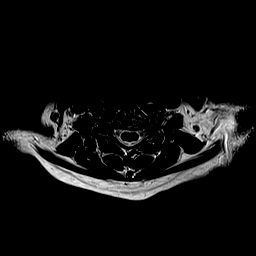
[im 16/30]
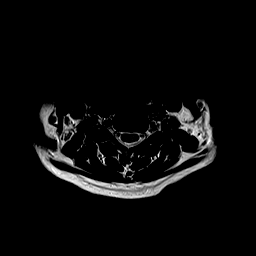
[im 21/30]
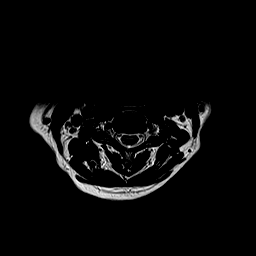
[im 25/30]
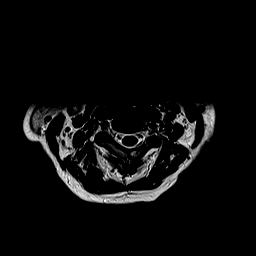
[im 30/30]
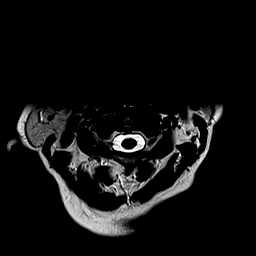

[Series 9: GRE · axial · 3.0mm · 0.35mm/px · z∈[-171,-75]mm · 8 of 30 slices shown]
[im 1/30]
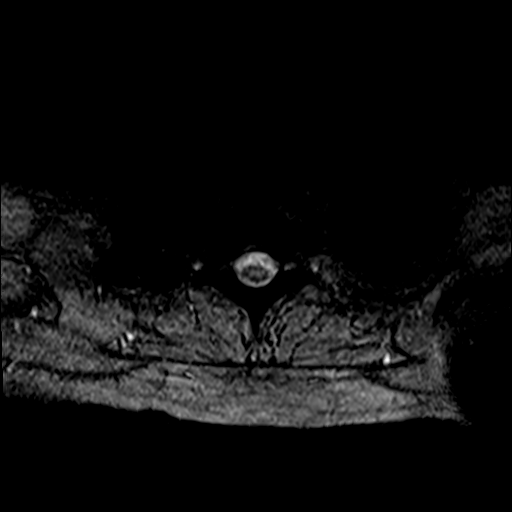
[im 5/30]
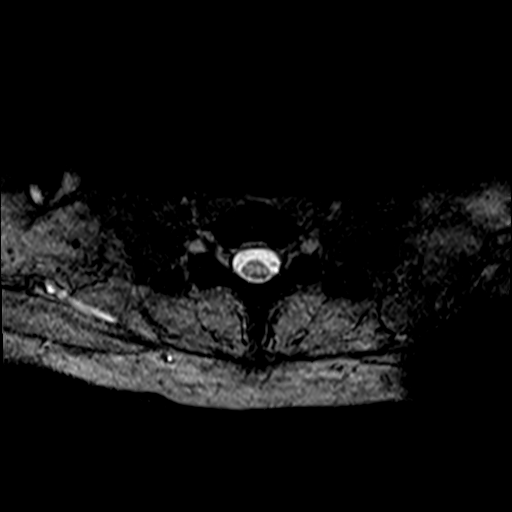
[im 9/30]
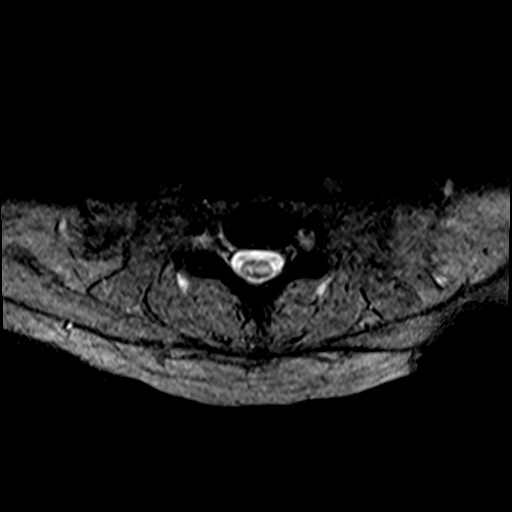
[im 14/30]
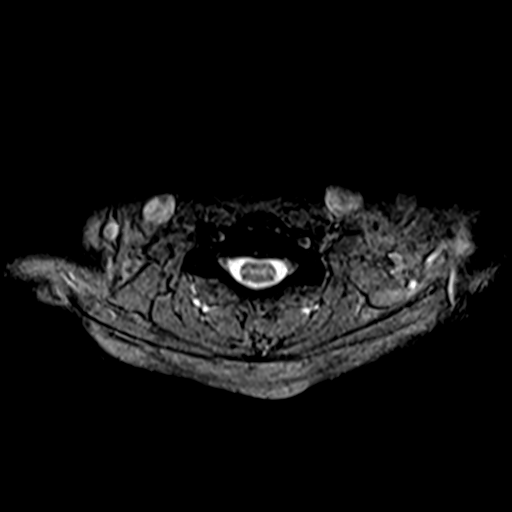
[im 16/30]
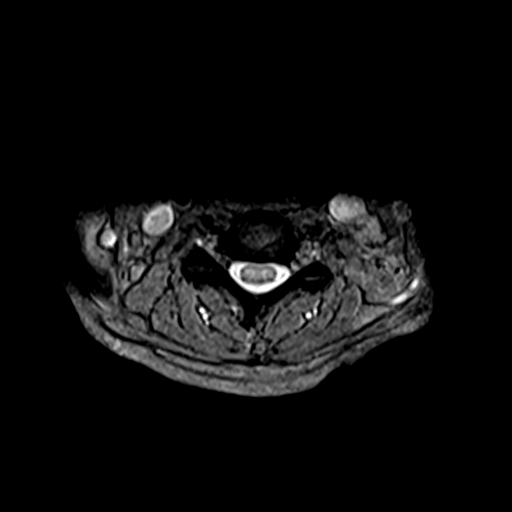
[im 21/30]
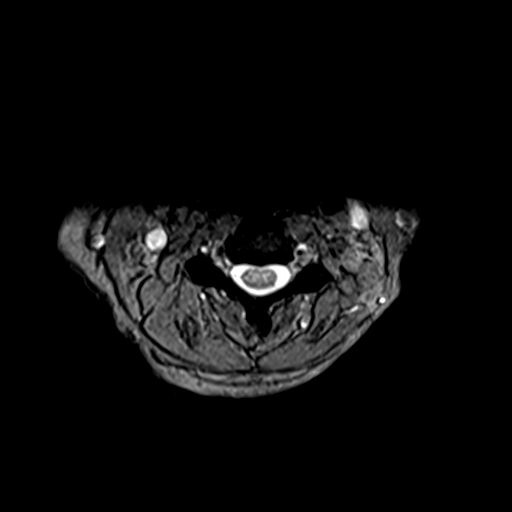
[im 25/30]
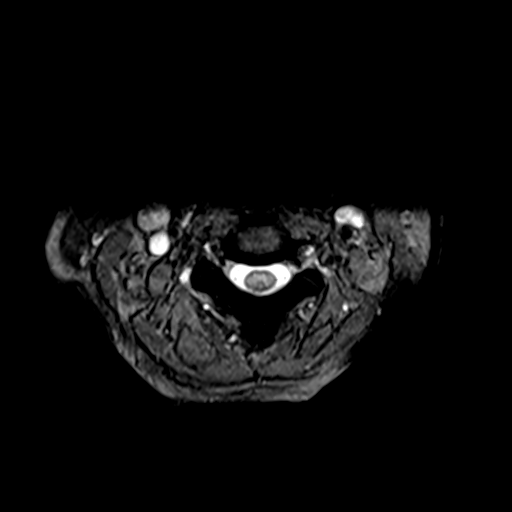
[im 30/30]
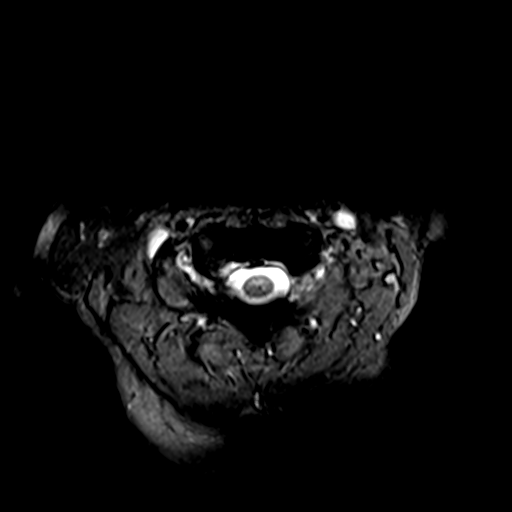

[39 of 48 positions shown; findings below may reference images not displayed]

FINDINGS: Alignment: Straightening of lordosis with slight reversal.

Vertebrae: Normal bone marrow signal intensity. No focal osseous
lesion.

Cord: Normal signal and morphology.

Posterior Fossa, vertebral arteries: Please see concurrent MRI head
for findings above the foramen magnum.

Disc levels: Mild multilevel desiccation.

C2-3: Shallow central protrusion and uncovertebral degenerative
spurring. Patent spinal canal and neural foramen.

C3-4: Small disc osteophyte complex with shallow left foraminal
protrusion. Uncovertebral degenerative spurring. Patent spinal canal
and neural foramen.

C4-5: Shallow left foraminal protrusion with uncovertebral
degenerative spurring. Patent spinal canal and right neural foramen.
Mild left neural foraminal narrowing.

C5-6: Shallow left foraminal protrusion and uncovertebral
degenerative spurring. Patent spinal canal and right neural foramen.
Mild left neural foraminal narrowing.

C6-7: Uncovertebral degenerative spurring. Patent spinal canal and
neural foramen.

C7-T1: No significant disc bulge. Uncovertebral degenerative
spurring. Patent spinal canal and neural foramen.

Paraspinal tissues: Prominent 1.1 cm right level 2A node may be
reactive.
IMPRESSION: No acute finding within the cervical spine or evidence of infection.

Mild multilevel spondylosis.  No significant spinal canal narrowing.

Mild left neural foraminal narrowing at the C4-5 and C5-6 levels.

## 2020-04-15 IMAGING — MR MR HEAD W/O CM
12 series · 48 of 48 positions shown · non-contrast
Comparison: [DATE] MRI brain.

CLINICAL DATA: Brain mass or lesion. Tingling, numbness and stiff
hands.

EXAM:
MRI HEAD WITHOUT CONTRAST
TECHNIQUE: Multiplanar, multiecho pulse sequences of the brain and surrounding
structures were obtained without intravenous contrast.

[Series 5: DWI · axial · 3.0mm · 0.77mm/px · z∈[-45,+101]mm · 3 of 50 slices shown (1 of 4)]
[im 1/50]
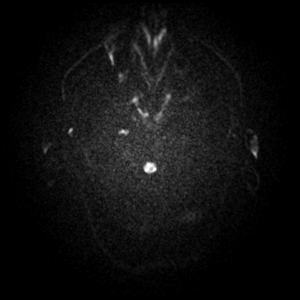
[im 25/50]
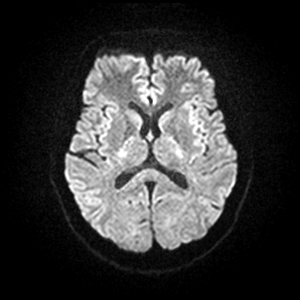
[im 50/50]
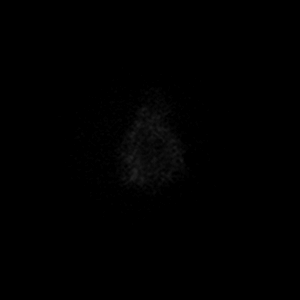

[Series 6: DWI · axial · 3.0mm · 0.77mm/px · z∈[-45,+101]mm · 3 of 50 slices shown (2 of 4)]
[im 1/50]
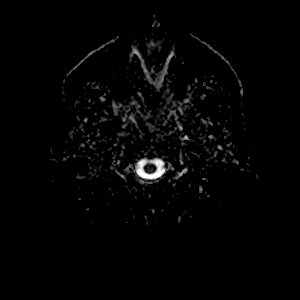
[im 25/50]
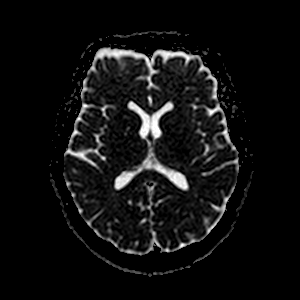
[im 50/50]
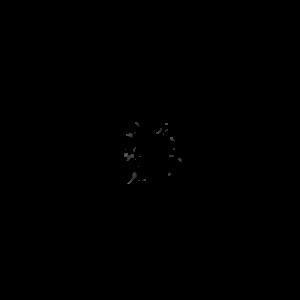

[Series 7: DWI · coronal · 5.0mm · 0.88mm/px · 2 of 28 slices shown (3 of 4)]
[im 1/28]
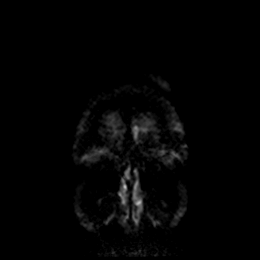
[im 28/28]
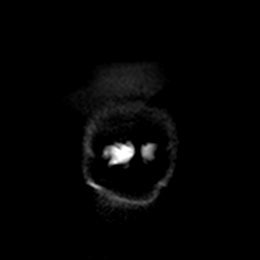

[Series 8: DWI · coronal · 5.0mm · 0.88mm/px · 2 of 28 slices shown (4 of 4)]
[im 1/28]
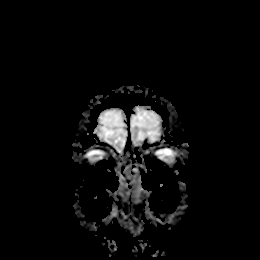
[im 28/28]
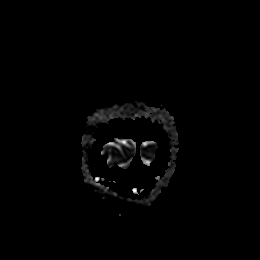

[Series 9: T1 · sagittal · 5.0mm · 0.75mm/px · 2 of 21 slices shown (1 of 2)]
[im 1/21]
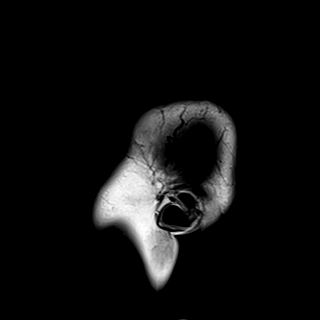
[im 21/21]
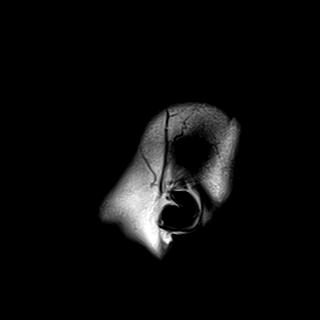

[Series 10: T2 · axial · 5.0mm · 0.72mm/px · z∈[-48,+105]mm · 2 of 23 slices shown (1 of 2)]
[im 1/23]
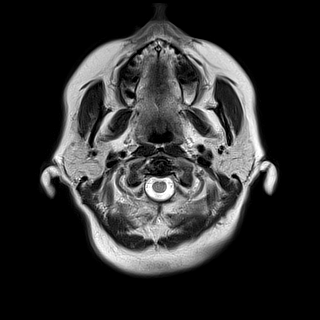
[im 23/23]
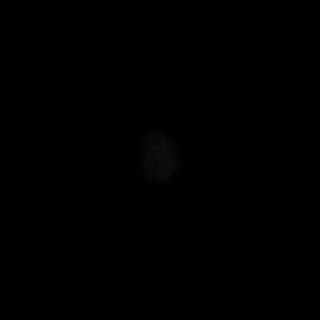

[Series 11: mag_images · axial · 3.0mm · 0.90mm/px · z∈[-59,+117]mm · 5 of 60 slices shown]
[im 1/60]
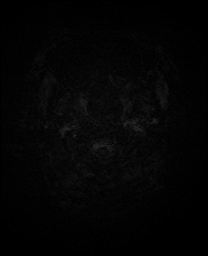
[im 15/60]
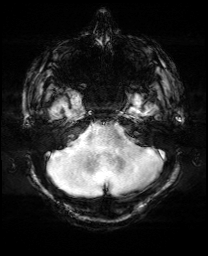
[im 30/60]
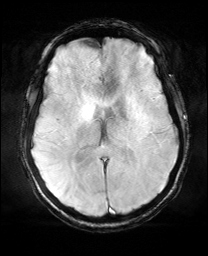
[im 45/60]
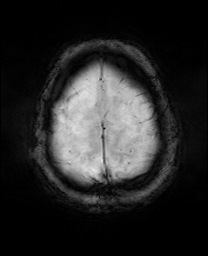
[im 60/60]
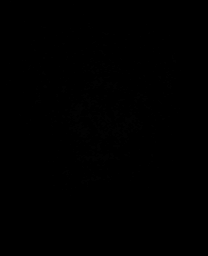

[Series 12: pha_images · axial · 3.0mm · 0.90mm/px · z∈[-56,+117]mm · 5 of 59 slices shown]
[im 1/59]
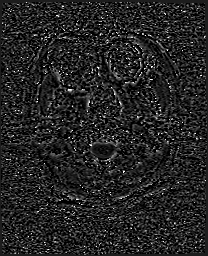
[im 15/59]
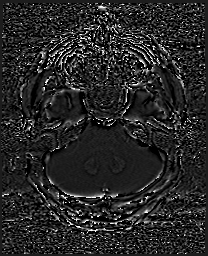
[im 30/59]
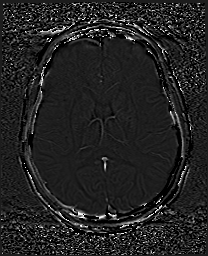
[im 44/59]
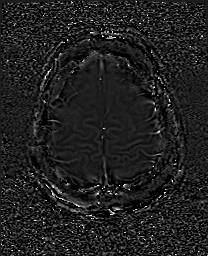
[im 59/59]
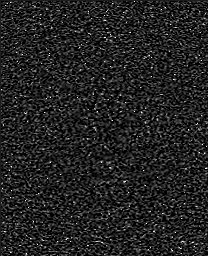

[Series 13: swi_images · axial · 3.0mm · 0.90mm/px · z∈[-59,+117]mm · 5 of 60 slices shown]
[im 1/60]
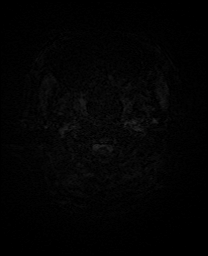
[im 15/60]
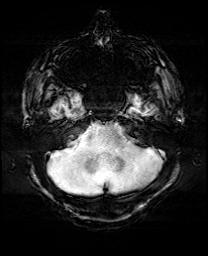
[im 30/60]
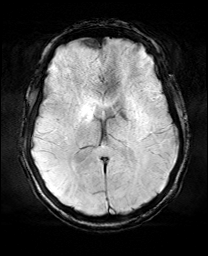
[im 45/60]
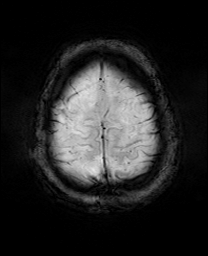
[im 60/60]
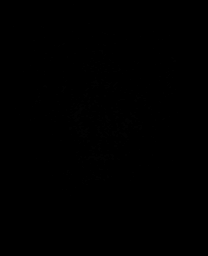

[Series 15: FLAIR · axial · 3.0mm · 0.45mm/px · z∈[-44,+102]mm · 4 of 50 slices shown]
[im 1/50]
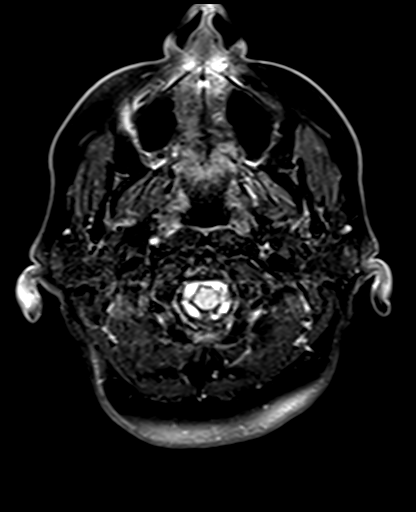
[im 17/50]
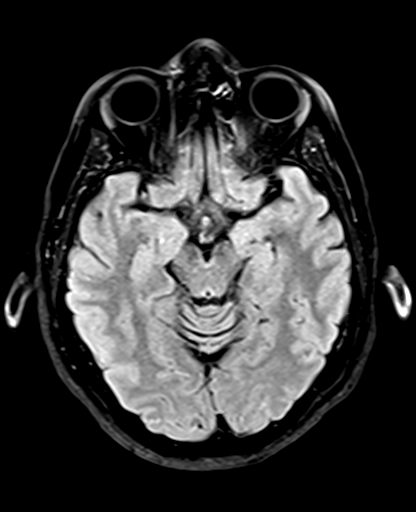
[im 33/50]
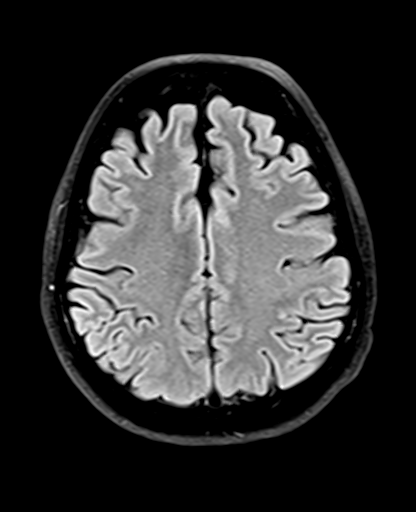
[im 50/50]
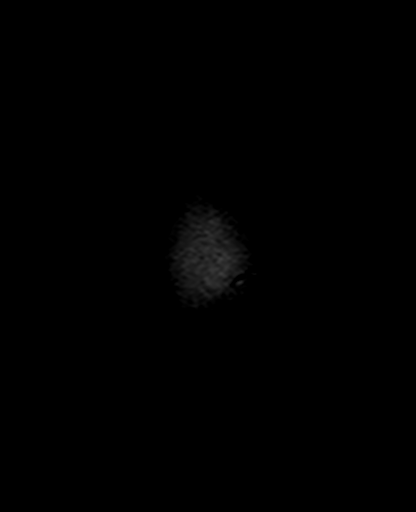

[Series 16: T1 · axial · 1.0mm · 0.98mm/px · z∈[-59,+112]mm · 13 of 167 slices shown (2 of 2)]
[im 1/167]
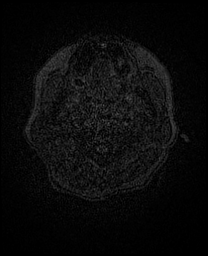
[im 14/167]
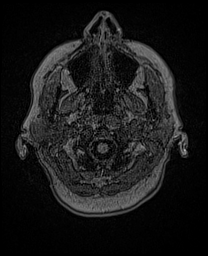
[im 28/167]
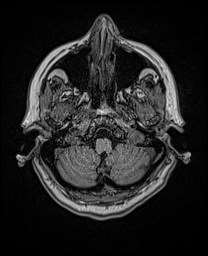
[im 42/167]
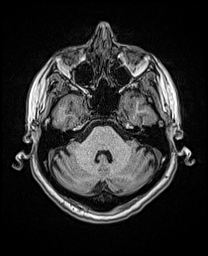
[im 56/167]
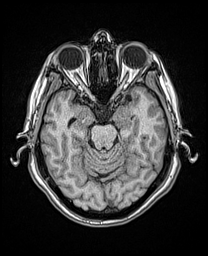
[im 70/167]
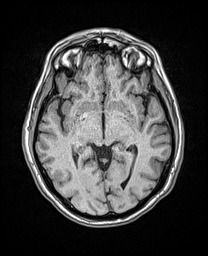
[im 84/167]
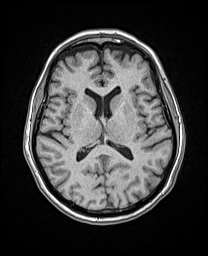
[im 97/167]
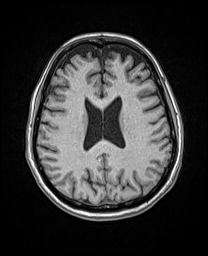
[im 111/167]
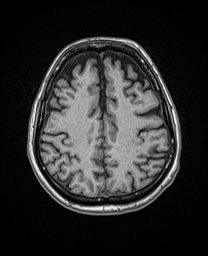
[im 125/167]
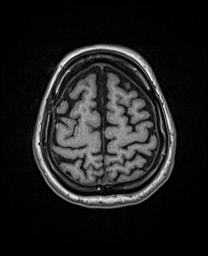
[im 139/167]
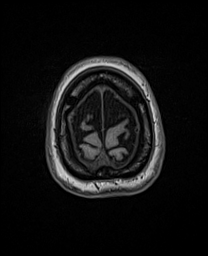
[im 153/167]
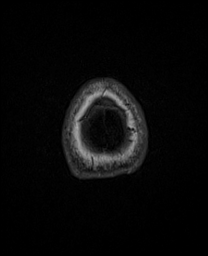
[im 167/167]
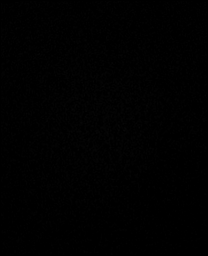

[Series 17: T2 · coronal · 5.0mm · 0.72mm/px · 2 of 28 slices shown (2 of 2)]
[im 1/28]
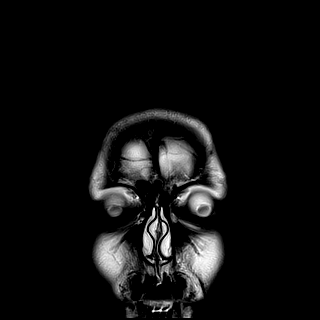
[im 28/28]
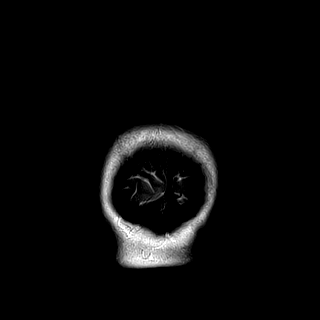

[48 of 48 positions shown; findings below may reference images not displayed]

FINDINGS: Brain: No diffusion-weighted signal abnormality. No intracranial
hemorrhage. No midline shift, ventriculomegaly or extra-axial fluid
collection. No mass lesion.

Vascular: Normal flow voids.

Skull and upper cervical spine: Normal marrow signal.

Sinuses/Orbits: Normal orbits. Clear paranasal sinuses. No mastoid
effusion.

Other: None.
IMPRESSION: No acute intracranial process.

Normal MRI brain.

## 2020-04-15 MED ORDER — LORAZEPAM 2 MG/ML IJ SOLN
1.0000 mg | Freq: Once | INTRAMUSCULAR | Status: AC
  Administered 2020-04-15: 1 mg via INTRAVENOUS
  Filled 2020-04-15: qty 1

## 2020-04-15 NOTE — ED Provider Notes (Signed)
[facility] EMERGENCY DEPARTMENT Provider Note   CSN: [id number] Arrival date & time: 04/15/20  7510     History Chief Complaint  Patient presents with  . Numbness    Dawn Kelley is a 59 y.o. female.  Patient complains of numbness to both hands and forearms.  No decreased strength this is been going on for a while was seen to be getting worse.  The history is provided by the patient and medical records. No language interpreter was used.  Weakness Severity:  Mild Onset quality:  Sudden Timing:  Constant Progression:  Worsening Chronicity:  New Context: not alcohol use   Relieved by:  Nothing Worsened by:  Nothing Ineffective treatments:  None tried Associated symptoms: no abdominal pain, no chest pain, no cough, no diarrhea, no frequency, no headaches and no seizures        Past Medical History:  Diagnosis Date  . Anxiety   . Bipolar 1 disorder ([place])   . OCD (obsessive compulsive disorder)   . PTSD (post-traumatic stress disorder)     Patient Active Problem List   Diagnosis Date Noted  . Moderate episode of recurrent major depressive disorder ([place])   . Generalized anxiety disorder   . IBS (irritable bowel syndrome) 01/27/2020  . Abdominal pain 01/27/2020  . Nausea and vomiting 01/22/2020  . Pruritus 01/22/2020  . Chronic urticaria 01/22/2020    Past Surgical History:  Procedure Laterality Date  . BIOPSY  02/03/2020   Procedure: BIOPSY;  Surgeon: Harvel Quale, MD;  [facility];  Service: Gastroenterology;;  . COLONOSCOPY WITH PROPOFOL N/A 02/03/2020   Procedure: COLONOSCOPY WITH PROPOFOL;  Surgeon: Harvel Quale, MD;  Location[facility];  Service: Gastroenterology;  Laterality: N/A;  830  . ESOPHAGOGASTRODUODENOSCOPY (EGD) WITH PROPOFOL N/A 02/03/2020   Procedure: ESOPHAGOGASTRODUODENOSCOPY (EGD) WITH PROPOFOL;  Surgeon: Harvel Quale, MD;  [facility];  Service: Gastroenterology;  Laterality:  N/A;  . INDUCED ABORTION N/A 1980  . POLYPECTOMY  02/03/2020   Procedure: POLYPECTOMY;  Surgeon: Harvel Quale, MD;  Location: [facility];  Service: Gastroenterology;;  . VEIN SURGERY       OB History   No obstetric history on file.     Family History  Problem Relation Age of Onset  . Depression Mother   . Pneumonia Mother     Social History   Tobacco Use  . Smoking status: Never Smoker  . Smokeless tobacco: Never Used  Substance Use Topics  . Alcohol use: Not Currently  . Drug use: Not Currently    Types: Marijuana    Home Medications Prior to Admission medications   Medication Sig Start Date End Date Taking? Authorizing Provider  citalopram (CELEXA) 20 MG tablet Take 20 mg by mouth daily. 04/12/20 05/12/20 Yes [provider]  clonazePAM (KLONOPIN) 0.5 MG tablet Take 0.5 mg by mouth in the morning, at noon, and at bedtime. 03/29/20  Yes [provider]  divalproex (DEPAKOTE ER) 250 MG 24 hr tablet Take 500 mg by mouth at bedtime. 03/20/20  Yes [provider]  famotidine (PEPCID) 20 MG tablet Take 20 mg by mouth daily.    Yes [provider]  hydrOXYzine (ATARAX/VISTARIL) 50 MG tablet Take 1 tablet (50 mg total) by mouth at bedtime. Patient taking differently: Take 25 mg by mouth in the morning, at noon, and at bedtime.  01/22/20  Yes Valentina Shaggy, MD  linaclotide [facility]) 72 MCG capsule Take 1 capsule (72 mcg  total) by mouth daily. 01/27/20  Yes Harvel Quale, MD  pantoprazole (PROTONIX) 40 MG tablet Take 1 tablet (40 mg total) by mouth daily. 02/08/20  Yes Harvel Quale, MD  propranolol (INDERAL) 20 MG tablet Take 10 mg by mouth in the morning, at noon, and at bedtime. 03/29/20  Yes [provider]  risperiDONE (RISPERDAL) 0.5 MG tablet Take 1 tablet (0.5 mg total) by mouth at bedtime. Patient taking differently: Take 0.5 mg by mouth in the morning, at noon, and at bedtime.  02/27/20   Yes Money, Lowry Ram, FNP  risperiDONE (RISPERDAL) 0.25 MG tablet Take 1 tablet (0.25 mg total) by mouth 2 (two) times daily. 1 tab at 8 am and 1 tab at 4 pm Patient not taking: Reported on 04/15/2020 02/27/20   Money, Lowry Ram, FNP    Allergies    Escitalopram, Seroquel [quetiapine fumarate], and Trazodone  Review of Systems   Review of Systems  Constitutional: Negative for appetite change and fatigue.  HENT: Negative for congestion, ear discharge and sinus pressure.   Eyes: Negative for discharge.  Respiratory: Negative for cough.   Cardiovascular: Negative for chest pain.  Gastrointestinal: Negative for abdominal pain and diarrhea.  Genitourinary: Negative for frequency and hematuria.  Musculoskeletal: Negative for back pain.  Skin: Negative for rash.  Neurological: Positive for numbness. Negative for seizures and headaches.  Psychiatric/Behavioral: Negative for hallucinations.    Physical Exam Updated Vital Signs BP 116/70   Pulse 66   Temp 98.7 F (37.1 C) (Oral)   Resp 20   Ht 5\' 5"  (1.651 m)   Wt 72.6 kg   SpO2 98%   BMI 26.63 kg/m   Physical Exam Vitals and nursing note reviewed.  Constitutional:      Appearance: She is well-developed.  HENT:     Head: Normocephalic.     Nose: Nose normal.  Eyes:     General: No scleral icterus.    Conjunctiva/sclera: Conjunctivae normal.  Neck:     Thyroid: No thyromegaly.  Cardiovascular:     Rate and Rhythm: Normal rate and regular rhythm.     Heart sounds: No murmur heard.  No friction rub. No gallop.   Pulmonary:     Breath sounds: No stridor. No wheezing or rales.  Chest:     Chest wall: No tenderness.  Abdominal:     General: There is no distension.     Tenderness: There is no abdominal tenderness. There is no rebound.  Musculoskeletal:        General: Normal range of motion.     Cervical back: Neck supple.     Comments: Mild numbness to both hands  Lymphadenopathy:     Cervical: No cervical adenopathy.    Skin:    Findings: No erythema or rash.  Neurological:     Mental Status: She is alert and oriented to person, place, and time.     Motor: No abnormal muscle tone.     Coordination: Coordination normal.  Psychiatric:        Behavior: Behavior normal.     ED Results / Procedures / Treatments   Labs (all labs ordered are listed, but only abnormal results are displayed) Labs Reviewed  CBC WITH DIFFERENTIAL/PLATELET  COMPREHENSIVE METABOLIC PANEL    EKG None  Radiology MR BRAIN WO CONTRAST  Result Date: 04/15/2020 CLINICAL DATA:  Brain mass or lesion. Tingling, numbness and stiff hands. EXAM: MRI HEAD WITHOUT CONTRAST TECHNIQUE: Multiplanar, multiecho pulse sequences of the brain  and surrounding structures were obtained without intravenous contrast. COMPARISON:  12/30/2019 MRI brain. FINDINGS: Brain: No diffusion-weighted signal abnormality. No intracranial hemorrhage. No midline shift, ventriculomegaly or extra-axial fluid collection. No mass lesion. Vascular: Normal flow voids. Skull and upper cervical spine: Normal marrow signal. Sinuses/Orbits: Normal orbits. Clear paranasal sinuses. No mastoid effusion. Other: None. IMPRESSION: No acute intracranial process. Normal MRI brain. Electronically Signed   By: Primitivo Gauze M.D.   On: 04/15/2020 12:17   MR CERVICAL SPINE WO CONTRAST  Result Date: 04/15/2020 CLINICAL DATA:  Cervical radiculopathy, infection suspected EXAM: MRI CERVICAL SPINE WITHOUT CONTRAST TECHNIQUE: Multiplanar, multisequence MR imaging of the cervical spine was performed. No intravenous contrast was administered. COMPARISON:  Concurrent MRI head. FINDINGS: Alignment: Straightening of lordosis with slight reversal. Vertebrae: Normal bone marrow signal intensity. No focal osseous lesion. Cord: Normal signal and morphology. Posterior Fossa, vertebral arteries: Please see concurrent MRI head for findings above the foramen magnum. Disc levels: Mild multilevel  desiccation. C2-3: Shallow central protrusion and uncovertebral degenerative spurring. Patent spinal canal and neural foramen. C3-4: Small disc osteophyte complex with shallow left foraminal protrusion. Uncovertebral degenerative spurring. Patent spinal canal and neural foramen. C4-5: Shallow left foraminal protrusion with uncovertebral degenerative spurring. Patent spinal canal and right neural foramen. Mild left neural foraminal narrowing. C5-6: Shallow left foraminal protrusion and uncovertebral degenerative spurring. Patent spinal canal and right neural foramen. Mild left neural foraminal narrowing. C6-7: Uncovertebral degenerative spurring. Patent spinal canal and neural foramen. C7-T1: No significant disc bulge. Uncovertebral degenerative spurring. Patent spinal canal and neural foramen. Paraspinal tissues: Prominent 1.1 cm right level 2A node may be reactive. IMPRESSION: No acute finding within the cervical spine or evidence of infection. Mild multilevel spondylosis.  No significant spinal canal narrowing. Mild left neural foraminal narrowing at the C4-5 and C5-6 levels. Electronically Signed   By: Primitivo Gauze M.D.   On: 04/15/2020 12:25    Procedures Procedures (including critical care time)  Medications Ordered in ED Medications  LORazepam (ATIVAN) injection 1 mg (1 mg Intravenous Given 04/15/20 1007)    ED Course  I have reviewed the triage vital signs and the nursing notes.  Pertinent labs & imaging results that were available during my care of the patient were reviewed by me and considered in my medical decision making (see chart for details).    MDM Rules/Calculators/A&P                         Patient with paresthesias.  Labs unremarkable.  Patient with normal MRI of the head neck and will follow up with neurology Dr. Merlene Laughter.  Patient is Final Clinical Impression(s) / ED Diagnoses Final diagnoses:  Paresthesia    Rx / DC Orders ED Discharge Orders    None        Milton Ferguson, MD 04/18/20 [phone]

## 2020-04-15 NOTE — ED Triage Notes (Signed)
Pt present to ED with tingling, numbness and stiff in hands x 11 days. Pt ambulatory and able to grasp. Tingleing comes and goes.

## 2020-04-15 NOTE — ED Notes (Signed)
Pt verbalized she had 2 covid vaccines.

## 2020-04-15 NOTE — ED Notes (Signed)
Pt returned from MRI, place back on Tele by [place]

## 2020-04-15 NOTE — Discharge Instructions (Addendum)
Follow-up with Dr. Merlene Laughter in a couple weeks for recheck

## 2020-04-15 NOTE — ED Notes (Signed)
Pt requesting something to drink and eat this time. MD notified.

## 2020-04-25 ENCOUNTER — Ambulatory Visit (INDEPENDENT_AMBULATORY_CARE_PROVIDER_SITE_OTHER): Payer: Medicare Other | Admitting: Gastroenterology

## 2020-04-25 ENCOUNTER — Encounter (INDEPENDENT_AMBULATORY_CARE_PROVIDER_SITE_OTHER): Payer: Self-pay | Admitting: Gastroenterology

## 2020-04-25 ENCOUNTER — Other Ambulatory Visit: Payer: Self-pay

## 2020-04-25 VITALS — BP 108/73 | HR 70 | Temp 97.6°F | Ht 65.5 in | Wt 170.0 lb

## 2020-04-25 DIAGNOSIS — K581 Irritable bowel syndrome with constipation: Secondary | ICD-10-CM

## 2020-04-25 NOTE — Patient Instructions (Signed)
Continue Linzess 72 mcg qday If still constipated, can take Miralax 1 cap every day Continue FODMAP diet but liberalize it based on your symptoms - do not follow strictly, it's a guide

## 2020-04-25 NOTE — Progress Notes (Signed)
[name], M.D. Gastroenterology & Hepatology [facility] [address] [place], [place] [address]  Primary Care Physician: Wannetta Sender, [place] [address] [place] [address] [place] [address]  I will communicate my assessment and recommendations to the referring MD via EMR. "Note: Occasional unusual wording and randomly placed punctuation marks may result from the use of speech recognition technology to transcribe this document"  Problems: 1. IBS-M  History of Present Illness: Dawn Kelley is a 59 y.o. 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  The patient was last seen on 01/27/2020. At that time, the patient was counseled to follow-up with her psychiatrist regarding her mental health as it was the driving force behind many of her complaints.  She was also advised to follow a low FODMAP diet.  She was started on Linzess 72 mcg every day as she was complaining of significant constipation.  Patient underwent an EGD on9/22/2021 which showed multiple small gastric polyps which were sessile fundic gland polyp.  Small bowel was biopsied, histology was normal.  Colonoscopy performed the same day showed a 3 mm polyp in the sigmoid colon that was removed with a cold snare, as well as hemorrhoids, histology was consistent with a tubular adenoma, for which the patient was recommended to have a repeat colonoscopy in 7 years.  Patient had signiifcant complaints during the last following months, had to follow closely with psychiatry as she had severe exacerbation of her psychiatric disease, for which she required to be hospitalized.  I discussed her case with her Psychiatrist Dr. Nyoka Cowden who may define changes in her medication.  The patient states that currently her symptoms have much more improved her her mental  health is more controlled.  States that she was recently diagnosed with fibromyalgia and she was given dietary recommendations that partially contracted her low FODMAP diet which she has found to be useful for her bloating and abdominal discomfort.  States that she is having 1 bowel movement per day, which she describes as pebble shaped initially but follows as a regular bowel movement.  She is trying to stay as active as possible as this has improved her abdominal discomfort.  Denies any nausea, vomiting, fever, chills, hematochezia, melena, hematemesis, diarrhea, jaundice, pruritus or weight loss.  Past Medical History: Past Medical History:  Diagnosis Date  . Anxiety   . Bipolar 1 disorder ([place])   . OCD (obsessive compulsive disorder)   . PTSD (post-traumatic stress disorder)     Past Surgical History: Past Surgical History:  Procedure Laterality Date  . BIOPSY  02/03/2020   Procedure: BIOPSY;  Surgeon: Harvel Quale, MD;  [facility];  Service: Gastroenterology;;  . COLONOSCOPY WITH PROPOFOL N/A 02/03/2020   Procedure: COLONOSCOPY WITH PROPOFOL;  Surgeon: Harvel Quale, MD;  [facility];  Service: Gastroenterology;  Laterality: N/A;  830  . ESOPHAGOGASTRODUODENOSCOPY (EGD) WITH PROPOFOL N/A 02/03/2020   Procedure: ESOPHAGOGASTRODUODENOSCOPY (EGD) WITH PROPOFOL;  Surgeon: Harvel Quale, MD;  [facility]: [facility];  Service: Gastroenterology;  Laterality: N/A;  . INDUCED ABORTION N/A 1980  . POLYPECTOMY  02/03/2020   Procedure: POLYPECTOMY;  Surgeon: Harvel Quale, MD;  [facility]: [facility];  Service: Gastroenterology;;  . VEIN SURGERY      Family History: Family History  Problem Relation Age of Onset  .  Depression Mother   . Pneumonia Mother     Social History: Social History   Tobacco Use  Smoking Status Never Smoker  Smokeless Tobacco Never Used   Social History   Substance and Sexual Activity   Alcohol Use Not Currently   Social History   Substance and Sexual Activity  Drug Use Not Currently    Allergies: Allergies  Allergen Reactions  . Escitalopram Nausea And Vomiting    Tolerated citalopram with out difficulty for many years.  . Seroquel [Quetiapine Fumarate] Shortness Of Breath  . Trazodone Other (See Comments)    Hives.    Medications: Current Outpatient Medications  Medication Sig Dispense Refill  . acetaminophen (TYLENOL) 325 MG tablet Take 325 mg by mouth every 6 (six) hours as needed. 975 mg  per day for one week per patient.    . citalopram (CELEXA) 20 MG tablet Take 20 mg by mouth daily.    . clonazePAM (KLONOPIN) 0.5 MG tablet Take 0.5 mg by mouth in the morning, at noon, and at bedtime.    . cyclobenzaprine (FLEXERIL) 10 MG tablet Take 10 mg by mouth at bedtime.    . divalproex (DEPAKOTE ER) 250 MG 24 hr tablet Take 500 mg by mouth at bedtime.    . Glucosamine-Chondroitin (GLUCOSAMINE CHONDR COMPLEX PO) Take 1 capsule by mouth daily.    . hydrOXYzine (ATARAX/VISTARIL) 50 MG tablet Take 1 tablet (50 mg total) by mouth at bedtime. (Patient taking differently: Take 25 mg by mouth in the morning, at noon, and at bedtime.) 90 tablet 2  . linaclotide (LINZESS) 72 MCG capsule Take 1 capsule (72 mcg total) by mouth daily. 30 capsule 2  . Omega-3 Fatty Acids (FISH OIL) 1000 MG CAPS Take 1,000 mg by mouth daily.    Marland Kitchen OVER THE COUNTER MEDICATION Probiotic two po Qd per patient.    Marland Kitchen OVER THE COUNTER MEDICATION B 12 one po QD per patient .    . pantoprazole (PROTONIX) 40 MG tablet Take 1 tablet (40 mg total) by mouth daily. 90 tablet 2  . risperiDONE (RISPERDAL) 0.5 MG tablet Take 1 tablet (0.5 mg total) by mouth at bedtime. (Patient taking differently: Take 0.5 mg by mouth in the morning, at noon, and at bedtime.) 30 tablet 0  . Turmeric (QC TUMERIC COMPLEX) 500 MG CAPS Take 1,000 mg by mouth. Daily per patient.    . propranolol (INDERAL) 20 MG tablet Take 10 mg by  mouth in the morning, at noon, and at bedtime. (Patient not taking: Reported on 04/25/2020)     No current facility-administered medications for this visit.    Review of Systems: GENERAL: negative for malaise, night sweats HEENT: No changes in hearing or vision, no nose bleeds or other nasal problems. NECK: Negative for lumps, goiter, pain and significant neck swelling RESPIRATORY: Negative for cough, wheezing CARDIOVASCULAR: Negative for chest pain, leg swelling, palpitations, orthopnea GI: SEE HPI MUSCULOSKELETAL: Negative for joint pain or swelling, back pain, and muscle pain. SKIN: Negative for lesions, rash PSYCH: Negative for sleep disturbance, mood disorder and recent psychosocial stressors. HEMATOLOGY Negative for prolonged bleeding, bruising easily, and swollen nodes. ENDOCRINE: Negative for cold or heat intolerance, polyuria, polydipsia and goiter. NEURO: negative for tremor, gait imbalance, syncope and seizures. The remainder of the review of systems is noncontributory.   Physical Exam: BP 108/73 (BP Location: Left Arm, Patient Position: Sitting, Cuff Size: Large)   Pulse 70   Temp 97.6 F (36.4 C) (Oral)   Ht 5' 5.5" (  1.664 m)   Wt 170 lb (77.1 kg)   BMI 27.86 kg/m  GENERAL: The patient is AO x3, in no acute distress. HEENT: Head is normocephalic and atraumatic. EOMI are intact. Mouth is well hydrated and without lesions. NECK: Supple. No masses LUNGS: Clear to auscultation. No presence of rhonchi/wheezing/rales. Adequate chest expansion HEART: RRR, normal s1 and s2. ABDOMEN: Soft, nontender, no guarding, no peritoneal signs, and nondistended. BS +. No masses. EXTREMITIES: Without any cyanosis, clubbing, rash, lesions or edema. NEUROLOGIC: AOx3, no focal motor deficit. SKIN: no jaundice, no rashes  Imaging/Labs: as above  I personally reviewed and interpreted the available labs, imaging and endoscopic files.  Impression and Plan: Dawn Kelley is a 59 y.o.  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  The patient has improved from the symptom standpoint significantly.  She had negative endoscopic and imaging investigations in the past, organic etiology was ruled out as a cause for her symptoms.  Fortunately, her psychiatric management has also led to significant improvement of her symptomatology, for which she should follow closely with her psychiatrist.  I advised her that she could continue taking the low FODMAP diet but she can liberalize it and adjusted to her fibromyalgia diet as tolerated.  She will need to continue taking her Linzess as it has led to improvement in her constipation, but she can take some MiraLAX as needed if she is feeling that she has to strain significantly to have a bowel movement.  - Continue Linzess 72 mcg qday - If still constipated, can take Miralax 1 cap every day - Continue FODMAP diet but liberalize it based on your symptoms - RTC 6 months  All questions were answered.      Harvel Quale, MD Gastroenterology and Hepatology [facility]

## 2020-05-02 ENCOUNTER — Ambulatory Visit (INDEPENDENT_AMBULATORY_CARE_PROVIDER_SITE_OTHER): Payer: Medicare Other | Admitting: Gastroenterology

## 2020-05-15 ENCOUNTER — Other Ambulatory Visit: Payer: Self-pay

## 2020-05-15 ENCOUNTER — Encounter (HOSPITAL_COMMUNITY): Payer: Self-pay | Admitting: Emergency Medicine

## 2020-05-15 ENCOUNTER — Emergency Department (HOSPITAL_COMMUNITY)
Admission: EM | Admit: 2020-05-15 | Discharge: 2020-05-15 | Disposition: A | Discharge status: Home / Self Care | Payer: Medicare Other | Attending: Emergency Medicine | Admitting: Emergency Medicine

## 2020-05-15 DIAGNOSIS — F419 Anxiety disorder, unspecified: Secondary | ICD-10-CM | POA: Diagnosis present

## 2020-05-15 DIAGNOSIS — F32A Depression, unspecified: Secondary | ICD-10-CM | POA: Diagnosis not present

## 2020-05-15 DIAGNOSIS — Z5321 Procedure and treatment not carried out due to patient leaving prior to being seen by health care provider: Secondary | ICD-10-CM | POA: Diagnosis not present

## 2020-05-15 NOTE — ED Triage Notes (Signed)
Pt states she has been on medications for anxiety and depression for 25 years and she fells like the medications are not working anymore.  Pt states she has felt this way since march and her provider has made several med changes since march.  Pt states she feels like she can no longer cope and has no motivation to complete small tasks.  Pt states she "would like to hurt herself but she is too afraid to do so because she does not want to hurt her family and she is a big wimp."

## 2020-05-23 ENCOUNTER — Other Ambulatory Visit (INDEPENDENT_AMBULATORY_CARE_PROVIDER_SITE_OTHER): Payer: Self-pay | Admitting: Gastroenterology

## 2020-05-23 DIAGNOSIS — K581 Irritable bowel syndrome with constipation: Secondary | ICD-10-CM

## 2020-05-24 ENCOUNTER — Other Ambulatory Visit (INDEPENDENT_AMBULATORY_CARE_PROVIDER_SITE_OTHER): Payer: Self-pay

## 2020-05-24 NOTE — Telephone Encounter (Signed)
Last seen 04/25/2020 for [facility] Dr. Jenetta Downer.

## 2020-06-24 DIAGNOSIS — F314 Bipolar disorder, current episode depressed, severe, without psychotic features: Secondary | ICD-10-CM | POA: Insufficient documentation

## 2020-07-13 ENCOUNTER — Telehealth (INDEPENDENT_AMBULATORY_CARE_PROVIDER_SITE_OTHER): Payer: Self-pay | Admitting: *Deleted

## 2020-07-13 NOTE — Telephone Encounter (Signed)
Patient called, and I got the call. She shares with me that she has mental health disorders and most recently has been undergoing shock treatment. The last treatment was 3 weeks ago. Dawn Kelley had two medication bottles and she could not remember who Dr.[name] was or what the medication was for. She was questioning mainly the Linzess. I explained that this medication was for Constipation. That seemed to have recalled her memory some. She shares that she had a terrible diarrhea event yesterday at [facility] she had no control over her bowels. She also explains that at her last hospitalization she was put on Lithium and one of the side effects was diarrhea. With this being said she feels that she should not currently rake the Linzess. Patient was advised that she could hold if for now.  Ms. Voth is asking for a office appointment with Dr. Jenetta Downer. Dr. Jenetta Downer was made aware and he has said that he would see the patient. She was advised that we would call her back by Friday, she said that we may leave a message and she would call us back.

## 2020-08-10 ENCOUNTER — Other Ambulatory Visit: Payer: Self-pay | Admitting: Physician Assistant

## 2020-08-10 DIAGNOSIS — N950 Postmenopausal bleeding: Secondary | ICD-10-CM

## 2020-08-10 DIAGNOSIS — D219 Benign neoplasm of connective and other soft tissue, unspecified: Secondary | ICD-10-CM

## 2020-08-16 DIAGNOSIS — Z79891 Long term (current) use of opiate analgesic: Secondary | ICD-10-CM | POA: Diagnosis not present

## 2020-09-01 ENCOUNTER — Encounter: Payer: Medicare Other | Admitting: Adult Health

## 2020-09-05 ENCOUNTER — Ambulatory Visit (INDEPENDENT_AMBULATORY_CARE_PROVIDER_SITE_OTHER): Payer: Medicare Other | Admitting: Obstetrics & Gynecology

## 2020-09-05 ENCOUNTER — Encounter: Payer: Self-pay | Admitting: Obstetrics & Gynecology

## 2020-09-05 ENCOUNTER — Other Ambulatory Visit (HOSPITAL_COMMUNITY)
Admission: RE | Admit: 2020-09-05 | Discharge: 2020-09-05 | Disposition: A | Discharge status: Home / Self Care | Payer: Medicare Other | Source: Ambulatory Visit | Attending: Obstetrics & Gynecology | Admitting: Obstetrics & Gynecology

## 2020-09-05 ENCOUNTER — Other Ambulatory Visit: Payer: Self-pay

## 2020-09-05 VITALS — BP 131/69 | HR 57 | Ht 66.0 in | Wt 170.0 lb

## 2020-09-05 DIAGNOSIS — Z1231 Encounter for screening mammogram for malignant neoplasm of breast: Secondary | ICD-10-CM | POA: Diagnosis not present

## 2020-09-05 DIAGNOSIS — Z01419 Encounter for gynecological examination (general) (routine) without abnormal findings: Secondary | ICD-10-CM | POA: Insufficient documentation

## 2020-09-05 DIAGNOSIS — D259 Leiomyoma of uterus, unspecified: Secondary | ICD-10-CM

## 2020-09-05 NOTE — Progress Notes (Signed)
Chief Complaint  Patient presents with  . New Patient (Initial Visit)      60 y.o. [id number] No LMP recorded. Patient is postmenopausal. The current method of family planning is post menopausal status.  Outpatient Encounter Medications as of 09/05/2020  Medication Sig Note  . acetaminophen (TYLENOL) 325 MG tablet Take 325 mg by mouth every 6 (six) hours as needed. 975 mg  per day for one week per patient.   . ALPRAZolam (XANAX) 1 MG tablet Take by mouth.   . ARIPiprazole (ABILIFY) 15 MG tablet Take by mouth.   . citalopram (CELEXA) 40 MG tablet citalopram 40 mg tablet   . clonazePAM (KLONOPIN) 0.5 MG tablet Take 0.5 mg by mouth in the morning, at noon, and at bedtime. 04/25/2020: One in am and two in pm per patient.  . cyclobenzaprine (FLEXERIL) 10 MG tablet Take 10 mg by mouth at bedtime.   . divalproex (DEPAKOTE ER) 250 MG 24 hr tablet Take 500 mg by mouth at bedtime.   . gabapentin (NEURONTIN) 100 MG capsule Take by mouth.   . Glucosamine-Chondroitin (GLUCOSAMINE CHONDR COMPLEX PO) Take 1 capsule by mouth daily.   . hydrOXYzine (ATARAX/VISTARIL) 50 MG tablet Take 1 tablet (50 mg total) by mouth at bedtime. (Patient taking differently: Take 25 mg by mouth in the morning, at noon, and at bedtime.)   . LINZESS 72 MCG capsule TAKE 1 CAPSULE BY MOUTH ONCE DAILY.   Marland Kitchen lithium carbonate (LITHOBID) 300 MG CR tablet Take by mouth.   . mirtazapine (REMERON) 15 MG tablet Take 15 mg by mouth at bedtime.   . Omega-3 Fatty Acids (FISH OIL) 1000 MG CAPS Take 1,000 mg by mouth daily.   . ondansetron (ZOFRAN) 4 MG tablet Take by mouth.   Marland Kitchen OVER THE COUNTER MEDICATION Probiotic two po Qd per patient.   Marland Kitchen OVER THE COUNTER MEDICATION B 12 one po QD per patient .   . pantoprazole (PROTONIX) 40 MG tablet Take 1 tablet (40 mg total) by mouth daily.   . propranolol (INDERAL) 20 MG tablet Take 10 mg by mouth in the morning, at noon, and at bedtime. 04/15/2020: Pt says it make her feel funny and says she  thinks it's causing tremors  . risperiDONE (RISPERDAL) 0.5 MG tablet Take 1 tablet (0.5 mg total) by mouth at bedtime. (Patient taking differently: Take 0.5 mg by mouth in the morning, at noon, and at bedtime.)   . tiZANidine (ZANAFLEX) 4 MG tablet Take 4 mg by mouth 3 (three) times daily.   . Turmeric 500 MG CAPS Take 1,000 mg by mouth. Daily per patient.   Marland Kitchen VRAYLAR capsule Take 1.5 mg by mouth daily.    No facility-administered encounter medications on file as of 09/05/2020.    Subjective Pt is seen for a second opinion  She had an episode of post menopausal bleeding while in the hospital for behavoiral health issues  She had an ultrasound which showed normal sized uterus with small fibroids, all less than 1.1 cm and 3 mm stripe Impression Performed by [facility]   1. Heavily fibroid uterus with predominantly intramural myomatous change, though a few lesions may have a submucosal component.  2. Arterial and venous flow are documented by spectral Doppler analysis in both ovaries.  Narrative Performed by [facility] US PELVIS COMPLETE TRANSABD TRANSVAG W DUPLEX, 07/04/2020 6:55 PM   INDICATION: \ N95.0 Postmenopausal vaginal bleeding \ F31.4 Bipolar 1 disorder, depressed, severe ([place])  COMPARISON: None.  TRANSABDOMINAL EXAM:   TECHNIQUE (Grayscale): Multi-planar real-time ultrasound of the pelvis utilizing a transabdominal approach was performed. The scan provides a larger field of view to help identify abnormalities which may be overlooked by the smaller field of view of the transvaginal exam.   FINDINGS:   . Uterus: Heterogeneous echotexture with multiple predominately intramural with some possible submucosal fibroids measuring up to 1.1 cm. No evidence of deep pelvic abnormality.  . Right ovary/adnexa: Not identified.  . Left ovary/adnexa: Identified and discussed in detail below.  . Bladder: Not visualized.  . Peritoneum: No fluid in the pelvis.   TRANSVAGINAL EXAM:    TECHNIQUE (Grayscale): Multi-planar real-time ultrasound of the pelvis utilizing a transvaginal approach was performed. This provides more detailed information about the endometrial canal, ovaries and their blood supply.   FINDINGS:   . Uterus: The uterus measures 4.1 x 4.8 x 7.6 cm. Diffusely heterogeneous echotexture. Multiple intramural fibroids, the largest discrete lesion measuring 1.1 cm along the anterior fundus, but with some potentially in a submucosal location. The visualized endometrial echo complex measures 3 mm in width.  . Right ovary/adnexa: Not visualized, perhaps due to a combination of atrophy and obscuration by bowel gas.  . Left ovary/adnexa: Normal size, contour, and echotexture. No mass. The ovary measures 1.1 x 1.2 x 1.4 cm. Volume = 1 mL.  Marland Kitchen Peritoneum: No fluid in the cul-de-sac.   TECHNIQUE (Doppler): Color and/or power Doppler combined with spectral Doppler analysis to evaluate blood flow to and from the ovaries was performed.   FINDINGS:   . Right ovary: Arterial and venous blood flow are documented.  . Left ovary: Arterial and venous blood flow are documented.  Breast bilateral Mammography    Impression Performed by [facility] RAD No mammographic evidence of malignancy. A result letter of this  screening mammogram will be mailed directly to the patient.   RECOMMENDATION:  Screening mammogram in one year. (Code:SM-B-01Y)   BI-RADS CATEGORY 1: Negative.    Electronically Signed  By: Everlean Alstrom M.D.  On: 03/01/2020 10:23  Narrative Performed by [facility] CLINICAL DATA: Screening.   EXAM:  DIGITAL SCREENING BILATERAL MAMMOGRAM WITH TOMO AND CAD   COMPARISON: Previous exam(s).   ACR Breast Density Category b: There are scattered areas of  fibroglandular density.   FINDINGS:  There are no findings suspicious for malignancy. Images were  processed with CAD.  Procedure Note  Wendelyn Breslow, MD - 03/01/2020  Formatting of  this note might be different from the original.  CLINICAL DATA: Screening.   EXAM:  DIGITAL SCREENING BILATERAL MAMMOGRAM WITH TOMO AND CAD   COMPARISON: Previous exam(s).   ACR Breast Density Category b: There are scattered areas of  fibroglandular density.   FINDINGS:  There are no findings suspicious for malignancy. Images were  processed with CAD.   IMPRESSION:  No mammographic evidence of malignancy. A result letter of this  screening mammogram will be mailed directly to the patient.   RECOMMENDATION:  Screening mammogram in one year. (Code:SM-B-01Y)   BI-RADS CATEGORY 1: Negative.    Electronically Signed   By: Everlean Alstrom M.D.   On: 03/01/2020 10:23  Past Medical History:  Diagnosis Date  . Anxiety   . Bipolar 1 disorder ([place])   . OCD (obsessive compulsive disorder)   . PTSD (post-traumatic stress disorder)     Past Surgical History:  Procedure Laterality Date  . BIOPSY  02/03/2020   Procedure: BIOPSY;  Surgeon: Harvel Quale, MD;  [facility];  Service: Gastroenterology;;  . COLONOSCOPY WITH PROPOFOL N/A 02/03/2020   Procedure: COLONOSCOPY WITH PROPOFOL;  Surgeon: Harvel Quale, MD;  [facility];  Service: Gastroenterology;  Laterality: N/A;  830  . ESOPHAGOGASTRODUODENOSCOPY (EGD) WITH PROPOFOL N/A 02/03/2020   Procedure: ESOPHAGOGASTRODUODENOSCOPY (EGD) WITH PROPOFOL;  Surgeon: Harvel Quale, MD;  [facility];  Service: Gastroenterology;  Laterality: N/A;  . INDUCED ABORTION N/A 1980  . POLYPECTOMY  02/03/2020   Procedure: POLYPECTOMY;  Surgeon: Harvel Quale, MD;  [facility];  Service: Gastroenterology;;  . VEIN SURGERY      OB History    Gravida  4   Para  3   Term  3   Preterm  0   AB  1   Living  3     SAB      IAB      Ectopic      Multiple      Live Births  3           Allergies  Allergen Reactions  . Escitalopram  Nausea And Vomiting    Tolerated citalopram with out difficulty for many years.  . Seroquel [Quetiapine Fumarate] Shortness Of Breath  . Trazodone Other (See Comments)    Hives.    Social History   Socioeconomic History  . Marital status: Married    Spouse name: Not on file  . Number of children: Not on file  . Years of education: Not on file  . Highest education level: Not on file  Occupational History  . Occupation: Disabled  Tobacco Use  . Smoking status: Never Smoker  . Smokeless tobacco: Never Used  Vaping Use  . Vaping Use: Never used  Substance and Sexual Activity  . Alcohol use: Not Currently  . Drug use: Not Currently  . Sexual activity: Not Currently    Birth control/protection: Post-menopausal  Other Topics Concern  . Not on file  Social History Narrative   Pt lives in [place] with husband.  She is on disability.  Pt stated that she receives outpatient psychiatry services through [facility]  She does not receive outpatient therapy.   Social Determinants of Health   Financial Resource Strain: Low Risk   . Difficulty of Paying Living Expenses: Not hard at all  Food Insecurity: No Food Insecurity  . Worried About Charity fundraiser in the Last Year: Never true  . Ran Out of Food in the Last Year: Never true  Transportation Needs: No Transportation Needs  . Lack of Transportation (Medical): No  . Lack of Transportation (Non-Medical): No  Physical Activity: Inactive  . Days of Exercise per Week: 0 days  . Minutes of Exercise per Session: 0 min  Stress: Unknown  . Feeling of Stress : Patient refused  Social Connections: Unknown  . Frequency of Communication with Friends and Family: More than three times a week  . Frequency of Social Gatherings with Friends and Family: Once a week  . Attends Religious Services: Never  . Active Member of Clubs or Organizations: No  . Attends Archivist Meetings: Never  . Marital Status: Patient refused     Family History  Problem Relation Age of Onset  . Depression Mother   . Pneumonia Mother     Medications:       Current Outpatient Medications:  .  acetaminophen (TYLENOL) 325 MG tablet, Take 325 mg by mouth every 6 (six) hours as needed. 975 mg  per day for one week per patient., Disp: , Rfl:  .  ALPRAZolam (XANAX) 1 MG tablet, Take by mouth., Disp: , Rfl:  .  ARIPiprazole (ABILIFY) 15 MG tablet, Take by mouth., Disp: , Rfl:  .  citalopram (CELEXA) 40 MG tablet, citalopram 40 mg tablet, Disp: , Rfl:  .  clonazePAM (KLONOPIN) 0.5 MG tablet, Take 0.5 mg by mouth in the morning, at noon, and at bedtime., Disp: , Rfl:  .  cyclobenzaprine (FLEXERIL) 10 MG tablet, Take 10 mg by mouth at bedtime., Disp: , Rfl:  .  divalproex (DEPAKOTE ER) 250 MG 24 hr tablet, Take 500 mg by mouth at bedtime., Disp: , Rfl:  .  gabapentin (NEURONTIN) 100 MG capsule, Take by mouth., Disp: , Rfl:  .  Glucosamine-Chondroitin (GLUCOSAMINE CHONDR COMPLEX PO), Take 1 capsule by mouth daily., Disp: , Rfl:  .  hydrOXYzine (ATARAX/VISTARIL) 50 MG tablet, Take 1 tablet (50 mg total) by mouth at bedtime. (Patient taking differently: Take 25 mg by mouth in the morning, at noon, and at bedtime.), Disp: 90 tablet, Rfl: 2 .  LINZESS 72 MCG capsule, TAKE 1 CAPSULE BY MOUTH ONCE DAILY., Disp: 30 capsule, Rfl: 11 .  lithium carbonate (LITHOBID) 300 MG CR tablet, Take by mouth., Disp: , Rfl:  .  mirtazapine (REMERON) 15 MG tablet, Take 15 mg by mouth at bedtime., Disp: , Rfl:  .  Omega-3 Fatty Acids (FISH OIL) 1000 MG CAPS, Take 1,000 mg by mouth daily., Disp: , Rfl:  .  ondansetron (ZOFRAN) 4 MG tablet, Take by mouth., Disp: , Rfl:  .  OVER THE COUNTER MEDICATION, Probiotic two po Qd per patient., Disp: , Rfl:  .  OVER THE COUNTER MEDICATION, B 12 one po QD per patient ., Disp: , Rfl:  .  pantoprazole (PROTONIX) 40 MG tablet, Take 1 tablet (40 mg total) by mouth daily., Disp: 90 tablet, Rfl: 2 .  propranolol (INDERAL) 20 MG  tablet, Take 10 mg by mouth in the morning, at noon, and at bedtime., Disp: , Rfl:  .  risperiDONE (RISPERDAL) 0.5 MG tablet, Take 1 tablet (0.5 mg total) by mouth at bedtime. (Patient taking differently: Take 0.5 mg by mouth in the morning, at noon, and at bedtime.), Disp: 30 tablet, Rfl: 0 .  tiZANidine (ZANAFLEX) 4 MG tablet, Take 4 mg by mouth 3 (three) times daily., Disp: , Rfl:  .  Turmeric 500 MG CAPS, Take 1,000 mg by mouth. Daily per patient., Disp: , Rfl:  .  VRAYLAR capsule, Take 1.5 mg by mouth daily., Disp: , Rfl:   Objective Blood pressure 131/69, pulse (!) 57, height 5\' 6"  (1.676 m), weight 170 lb (77.1 kg).  General WDWN female NAD Vulva:  normal appearing vulva with no masses, tenderness or lesions Vagina:  normal mucosa, no discharge Cervix:  Normal no lesions, Pap done today Uterus:  normal size, contour, position, consistency, mobility, non-tender Adnexa: ovaries:present,  normal adnexa in size, nontender and no masses Rectal normal tone no masses heme negative   Pertinent ROS No burning with urination, frequency or urgency No nausea, vomiting or diarrhea Nor fever chills or other constitutional symptoms   Labs or studies Reviewed MRI, sonogram, mammogram    Impression Diagnoses this Encounter::   ICD-10-CM   1. Uterine leiomyoma, unspecified location  D25.9   2. Encounter for gynecological examination with Papanicolaou smear of cervix  Z01.419 Cytology - PAP( [place])  3. Screening mammogram for breast cancer  Z12.31 MM 3D SCREEN BREAST  BILATERAL    Established relevant diagnosis(es):   Plan/Recommendations: No orders of the defined types were placed in this encounter.   Labs or Scans Ordered: Orders Placed This Encounter  Procedures  . MM 3D SCREEN BREAST BILATERAL    Management:: Normal exam, has small fibroids normal volume ES 3 mm no further eval needed Pap today  Follow up Return in about 1 year (around 09/05/2021) for  yearly.       All questions were answered.

## 2020-09-06 ENCOUNTER — Other Ambulatory Visit: Payer: Medicare Other

## 2020-09-07 LAB — CYTOLOGY - PAP
Comment: NEGATIVE
Diagnosis: UNDETERMINED — AB
High risk HPV: NEGATIVE

## 2020-09-28 DIAGNOSIS — Z79899 Other long term (current) drug therapy: Secondary | ICD-10-CM | POA: Diagnosis not present

## 2020-09-28 DIAGNOSIS — G250 Essential tremor: Secondary | ICD-10-CM | POA: Diagnosis not present

## 2020-09-28 DIAGNOSIS — R413 Other amnesia: Secondary | ICD-10-CM | POA: Diagnosis not present

## 2020-09-29 DIAGNOSIS — E038 Other specified hypothyroidism: Secondary | ICD-10-CM | POA: Diagnosis not present

## 2020-10-24 ENCOUNTER — Ambulatory Visit (INDEPENDENT_AMBULATORY_CARE_PROVIDER_SITE_OTHER): Payer: Medicare Other | Admitting: Gastroenterology

## 2020-11-07 ENCOUNTER — Ambulatory Visit (INDEPENDENT_AMBULATORY_CARE_PROVIDER_SITE_OTHER): Payer: Medicare Other | Admitting: Gastroenterology

## 2020-11-07 ENCOUNTER — Encounter (INDEPENDENT_AMBULATORY_CARE_PROVIDER_SITE_OTHER): Payer: Self-pay | Admitting: Gastroenterology

## 2020-11-07 ENCOUNTER — Other Ambulatory Visit: Payer: Self-pay

## 2020-11-07 VITALS — BP 117/78 | HR 62 | Temp 97.9°F | Ht 66.0 in | Wt 181.0 lb

## 2020-11-07 DIAGNOSIS — R112 Nausea with vomiting, unspecified: Secondary | ICD-10-CM | POA: Diagnosis not present

## 2020-11-07 DIAGNOSIS — K582 Mixed irritable bowel syndrome: Secondary | ICD-10-CM | POA: Diagnosis not present

## 2020-11-07 MED ORDER — ONDANSETRON 4 MG PO TBDP: 4.0000 mg | ORAL_TABLET | Freq: Three times a day (TID) | ORAL | 3 refills | Status: DC | PRN

## 2020-11-07 NOTE — Progress Notes (Signed)
[name], M.D. Gastroenterology & Hepatology [facility] [address] [place], [place] [address]  Primary Care Physician: Wannetta Sender, [place] [address] [place] [address]  I will communicate my assessment and recommendations to the referring MD via EMR.  Problems: IBS-M   History of Present Illness: Dawn Kelley is a 60 y.o. 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  The patient was last seen on 04/25/2020. At that time, the patient was advised to continue Linzess 72 mcg every day and to follow the low FODMAP diet.  She subsequently underwent ECT as prescribed by her psychiatrist which led to him provement of her symptoms transiently.  However she reports that for the last few months she has felt her symptoms have come back, especially her psychiatric complaints.  The patient reports that she has had very erratic sleeping. Usually has a BM after waking up . For the last 7-8 months she has had intermittent episodes of diarrhea (3-7 times a day) which do not happen every day. This got better after she was taken off the lithium which she believes led to significant diarrhea.  Now she is having more regular bowel movements with soft consistency.  States that she feels nauseated frequently. Used to vomit every day, but now only retching early in the morning. She now takes Zofran before her nausea worsens, and is controlling her nausea better.  She states that every day she wakes up with some pain in her upper abdominal area (epigastric) with doom sensation, which she describes as anxiety. She states that it eventually goes away after taking her medication (takes all the medications together so she does not know which one controls her pain).  The patient has not been taking Protonix  and Linzess as recommended by a friend as she was told that this could lead to interaction with her other medications.  Started taking Pepcid for heartburn episodes which controls this adequately.  The patient denies having any nausea, vomiting, fever, chills, hematochezia, melena, hematemesis, abdominal distention, abdominal pain, diarrhea, jaundice, pruritus . She lsot some weight in the past but has gained some weight. Has felt heartburn episodes after eating certain food like orange juice, sour fruits and tomatoes.  She has felt very stressed recently as she states that none of the medications have been able to control her anxiety and depression effectively.  She feels that the ECT failed to control her disease.  Is frustrated as her psychiatrist " has tried all medications without any significant improvement".  Last EGD: 02/03/2020 - showed multiple small gastric polyps which were sessile fundic gland polyp.  Small bowel was biopsied, histology was normal.  Colonoscopy performed the same day showed a 3 mm polyp in the sigmoid colon that was removed with a cold snare, as well as hemorrhoids, histology was consistent with a tubular adenoma, for which the patient was recommended to have a repeat colonoscopy in 7 years.  Past Medical History: Past Medical History:  Diagnosis Date   Anxiety    Bipolar 1 disorder ([place])    OCD (obsessive compulsive disorder)    PTSD (post-traumatic stress disorder)     Past Surgical History: Past Surgical History:  Procedure Laterality Date   BIOPSY  02/03/2020   Procedure: BIOPSY;  Surgeon: Harvel Quale, MD;  [facility];  Service: Gastroenterology;;   COLONOSCOPY WITH PROPOFOL N/A 02/03/2020   Procedure: COLONOSCOPY WITH PROPOFOL;  Surgeon: Harvel Quale, MD;  [facility];  Service: Gastroenterology;  Laterality: N/A;  830   ESOPHAGOGASTRODUODENOSCOPY (EGD) WITH PROPOFOL N/A 02/03/2020   Procedure:  ESOPHAGOGASTRODUODENOSCOPY (EGD) WITH PROPOFOL;  Surgeon: Harvel Quale, MD;  [facility];  Service: Gastroenterology;  Laterality: N/A;   INDUCED ABORTION N/A 1980   POLYPECTOMY  02/03/2020   Procedure: POLYPECTOMY;  Surgeon: Harvel Quale, MD;  [facility];  Service: Gastroenterology;;   VEIN SURGERY      Family History: Family History  Problem Relation Age of Onset   Depression Mother    Pneumonia Mother     Social History: Social History   Tobacco Use  Smoking Status Never  Smokeless Tobacco Never   Social History   Substance and Sexual Activity  Alcohol Use Not Currently   Social History   Substance and Sexual Activity  Drug Use Not Currently    Allergies: Allergies  Allergen Reactions   Escitalopram Nausea And Vomiting    Tolerated citalopram with out difficulty for many years.   Seroquel [Quetiapine Fumarate] Shortness Of Breath   Trazodone Other (See Comments)    Hives.    Medications: Current Outpatient Medications  Medication Sig Dispense Refill   acetaminophen (TYLENOL) 325 MG tablet Take 325 mg by mouth every 6 (six) hours as needed. 975 mg  per day for one week per patient.     ALPRAZolam (XANAX) 1 MG tablet Take by mouth.     ARIPiprazole (ABILIFY) 15 MG tablet Take by mouth.     citalopram (CELEXA) 40 MG tablet citalopram 40 mg tablet     clonazePAM (KLONOPIN) 0.5 MG tablet Take 0.5 mg by mouth in the morning, at noon, and at bedtime.     cyclobenzaprine (FLEXERIL) 10 MG tablet Take 10 mg by mouth at bedtime.     divalproex (DEPAKOTE ER) 250 MG 24 hr tablet Take 500 mg by mouth at bedtime.     gabapentin (NEURONTIN) 100 MG capsule Take by mouth.     Glucosamine-Chondroitin (GLUCOSAMINE CHONDR COMPLEX PO) Take 1 capsule by mouth daily.     hydrOXYzine (ATARAX/VISTARIL) 50 MG tablet Take 1 tablet (50 mg total) by mouth at bedtime. (Patient taking differently: Take 25 mg by mouth in the morning, at noon,  and at bedtime.) 90 tablet 2   LINZESS 72 MCG capsule TAKE 1 CAPSULE BY MOUTH ONCE DAILY. 30 capsule 11   lithium carbonate (LITHOBID) 300 MG CR tablet Take by mouth.     mirtazapine (REMERON) 15 MG tablet Take 15 mg by mouth at bedtime.     Omega-3 Fatty Acids (FISH OIL) 1000 MG CAPS Take 1,000 mg by mouth daily.     ondansetron (ZOFRAN) 4 MG tablet Take by mouth.     OVER THE COUNTER MEDICATION Probiotic two po Qd per patient.     OVER THE COUNTER MEDICATION B 12 one po QD per patient .     pantoprazole (PROTONIX) 40 MG tablet Take 1 tablet (40 mg total) by mouth daily. 90 tablet 2   propranolol (INDERAL) 20 MG tablet Take 10 mg by mouth in the morning, at noon, and at bedtime.     risperiDONE (RISPERDAL) 0.5 MG tablet Take 1 tablet (0.5 mg total) by mouth at bedtime. (Patient taking differently: Take 0.5 mg by mouth in the morning, at noon, and at bedtime.) 30 tablet 0  tiZANidine (ZANAFLEX) 4 MG tablet Take 4 mg by mouth 3 (three) times daily.     Turmeric 500 MG CAPS Take 1,000 mg by mouth. Daily per patient.     VRAYLAR capsule Take 1.5 mg by mouth daily.     No current facility-administered medications for this visit.    Review of Systems: GENERAL: negative for malaise, night sweats HEENT: No changes in hearing or vision, no nose bleeds or other nasal problems. NECK: Negative for lumps, goiter, pain and significant neck swelling RESPIRATORY: Negative for cough, wheezing CARDIOVASCULAR: Negative for chest pain, leg swelling, palpitations, orthopnea GI: SEE HPI MUSCULOSKELETAL: Negative for joint pain or swelling, back pain, and muscle pain. SKIN: Negative for lesions, rash PSYCH: Negative for sleep disturbance, mood disorder and recent psychosocial stressors. HEMATOLOGY Negative for prolonged bleeding, bruising easily, and swollen nodes. ENDOCRINE: Negative for cold or heat intolerance, polyuria, polydipsia and goiter. NEURO: negative for tremor, gait imbalance, syncope and  seizures. The remainder of the review of systems is noncontributory.   Physical Exam: BP 117/78 (BP Location: Left Arm, Patient Position: Sitting, Cuff Size: Large)   Pulse 62   Temp 97.9 F (36.6 C) (Oral)   Ht 5\' 6"  (1.676 m)   Wt 181 lb (82.1 kg)   BMI 29.21 kg/m  GENERAL: The patient is AO x3, in no acute distress. HEENT: Head is normocephalic and atraumatic. EOMI are intact. Mouth is well hydrated and without lesions. NECK: Supple. No masses LUNGS: Clear to auscultation. No presence of rhonchi/wheezing/rales. Adequate chest expansion HEART: RRR, normal s1 and s2. ABDOMEN: Soft, nontender, no guarding, no peritoneal signs, and nondistended. BS +. No masses. EXTREMITIES: Without any cyanosis, clubbing, rash, lesions or edema. NEUROLOGIC: AOx3, no focal motor deficit. SKIN: no jaundice, no rashes  Imaging/Labs: as above  I personally reviewed and interpreted the available labs, imaging and endoscopic files.  Impression and Plan: Dawn Kelley is a 60 y.o. 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  Patient has had very complicated psychiatric history which has affected her gastrointestinal symptoms significantly.  Overall, compared to her prior visits, I feel that her symptoms are better controlled.  She has been able to have better nausea control with her Zofran which I will refill today.  She can take this as needed but I advised her to take it when she starts feeling nauseated.  She used to have significant constipation in the past but is now having more diarrhea.  Thus, we will need to hold on prescribing Linzess for now and she will benefit from implementing the low FODMAP diet we have discussed in the past.  I emphasized to her that this is meant to provide some symptom control and she should not stress about following it strictly.  Finally, Her GERD episodes seem to be better controlled  while on Pepcid, which should be continued for now.  She should continue her chronic psychiatric treatment, which should be guided by her psychiatrist.  The patient understood and agreed.  - Can continue Zofran as needed for nausea - Continue Pepcid 20 mg qday - Continue current psychiatric medication - Follow up with psychiatry  All questions were answered.      Harvel Quale, MD Gastroenterology and Hepatology [facility]

## 2020-11-07 NOTE — Patient Instructions (Signed)
Can continue Zofran as needed for nausea Continue Pepcid 20 mg qday Continue current psychiatric medication Follow up with psychiatry

## 2020-11-09 ENCOUNTER — Ambulatory Visit: Payer: Self-pay | Admitting: *Deleted

## 2020-11-09 NOTE — Telephone Encounter (Signed)
Pt called in on the community line.    "My doctor gave me this list of emergency numbers and your number was on there is the reason I'm calling you".    "I need you to tell me what is wrong with me".      She is c/o having nausea, anxiety, restlessness and tremors of her head and hands.   When she tells her mental health specialist these things she tells pt that "She is on the right medications".      Pt looked up her medications on the internet and was starting to list them and all the side effects when I stopped her.    I let her know I would not be able to diagnose her over the phone she would need to talk with her mental health specialist or whoever prescribes her psych medications.    However if she felt she needed further help I could refer her to the [facility] [place] on [address].   Pt was receptive to this so I gave her the name, address and phone number for above.   Pt thanked me very much for my help and that she would call them.    I let her know she did not need an appt and could go anytime.  It's a walk in that's open 24/7.  "I just didn't know who else to call thank you so much for your help".

## 2020-11-09 NOTE — Telephone Encounter (Signed)
Reason for Disposition  Requesting to talk to a counselor (e.g., mental health worker, psychiatrist)    Referred to [facility] in [place].  Answer Assessment - Initial Assessment Questions 1. CONCERN: "Did anything happen that prompted you to call today?"     Pt calling in on community line.   My dr. Hanley Seamen me paper with emergency numbers and your number was on there. I'm having nausea, anxious, and restlessness.   I need you to tell me what is wrong with me. I looked up my medications.   My dr. Michela Pitcher I'm giving you all the right medications.   She wants me to take Vralar.   I looked it up.   The use of it can cause movement disorders.   I'm having tremors in my head and hands. I told my dr this and she just tells me I'm on the right medicine.    I don't know what to do.  Do you have the name of a psychiatrist I can go to? 2. ANXIETY SYMPTOMS: "Can you describe how you (your loved one; patient) have been feeling?" (e.g., tense, restless, panicky, anxious, keyed up, overwhelmed, sense of impending doom).      I'm restless, anxious, and having tremors in my head and hands.   I don't know what else to do.     3. ONSET: "How long have you been feeling this way?" (e.g., hours, days, weeks)     Not asked 4. SEVERITY: "How would you rate the level of anxiety?" (e.g., 0 - 10; or mild, moderate, severe).     Severe.   I don't know what else to do since my dr keeps telling me I'm on the right medication even with the side effects. 5. FUNCTIONAL IMPAIRMENT: "How have these feelings affected your ability to do daily activities?" "Have you had more difficulty than usual doing your normal daily activities?" (e.g., getting better, same, worse; self-care, school, work, interactions)     I'm anxious, uptight, restless. 6. HISTORY: "Have you felt this way before?" "Have you ever been diagnosed with an anxiety problem in the past?" (e.g., generalized anxiety disorder, panic attacks, PTSD). If  Yes, ask: "How was this problem treated?" (e.g., medicines, counseling, etc.)     Yes    Been in the hospital several times and had shock treatment 7. RISK OF HARM - SUICIDAL IDEATION: "Do you ever have thoughts of hurting or killing yourself?" If Yes, ask:  "Do you have these feelings now?" "Do you have a plan on how you would do this?"     At this point I referred her to the [facility]. 8. TREATMENT:  "What has been done so far to treat this anxiety?" (e.g., medicines, relaxation strategies). "What has helped?"     Had many treatments and medications 9. TREATMENT - THERAPIST: "Do you have a counselor or therapist? Name?"     Yes   She says I'm on the right medications when I try to tell her what is going on. 10. POTENTIAL TRIGGERS: "Do you drink caffeinated beverages (e.g., coffee, colas, teas), and how much daily?" "Do you drink alcohol or use any drugs?" "Have you started any new medicines recently?"     Not asked 10. PATIENT SUPPORT: "Who is with you now?" "Who do you live with?" "Do you have family or friends who you can talk to?"        Not asked 65. OTHER SYMPTOMS: "Do you have any other symptoms?" (  e.g., feeling depressed, trouble concentrating, trouble sleeping, trouble breathing, palpitations or fast heartbeat, chest pain, sweating, nausea, or diarrhea)       Not asked    See notes 12. PREGNANCY: "Is there any chance you are pregnant?" "When was your last menstrual period?"       N/A  Protocols used: Anxiety and Panic Attack-A-AH

## 2020-11-24 DIAGNOSIS — K2970 Gastritis, unspecified, without bleeding: Secondary | ICD-10-CM | POA: Diagnosis not present

## 2020-11-24 DIAGNOSIS — K649 Unspecified hemorrhoids: Secondary | ICD-10-CM | POA: Diagnosis not present

## 2020-11-24 DIAGNOSIS — E038 Other specified hypothyroidism: Secondary | ICD-10-CM | POA: Diagnosis not present

## 2020-12-04 ENCOUNTER — Emergency Department (HOSPITAL_COMMUNITY)
Admission: EM | Admit: 2020-12-04 | Discharge: 2020-12-04 | Disposition: A | Discharge status: Home / Self Care | Payer: Medicare Other | Attending: Emergency Medicine | Admitting: Emergency Medicine

## 2020-12-04 ENCOUNTER — Encounter (HOSPITAL_COMMUNITY): Payer: Self-pay

## 2020-12-04 ENCOUNTER — Other Ambulatory Visit: Payer: Self-pay

## 2020-12-04 DIAGNOSIS — R5381 Other malaise: Secondary | ICD-10-CM | POA: Diagnosis not present

## 2020-12-04 DIAGNOSIS — R112 Nausea with vomiting, unspecified: Secondary | ICD-10-CM | POA: Insufficient documentation

## 2020-12-04 LAB — CBC WITH DIFFERENTIAL/PLATELET
Abs Immature Granulocytes: 0.02 10*3/uL (ref 0.00–0.07)
Basophils Absolute: 0 10*3/uL (ref 0.0–0.1)
Basophils Relative: 1 %
Eosinophils Absolute: 0.1 10*3/uL (ref 0.0–0.5)
Eosinophils Relative: 1 %
HCT: 42.5 % (ref 36.0–46.0)
Hemoglobin: 13.9 g/dL (ref 12.0–15.0)
Immature Granulocytes: 0 %
Lymphocytes Relative: 31 %
Lymphs Abs: 2.3 10*3/uL (ref 0.7–4.0)
MCH: 28.7 pg (ref 26.0–34.0)
MCHC: 32.7 g/dL (ref 30.0–36.0)
MCV: 87.6 fL (ref 80.0–100.0)
Monocytes Absolute: 0.3 10*3/uL (ref 0.1–1.0)
Monocytes Relative: 4 %
Neutro Abs: 4.8 10*3/uL (ref 1.7–7.7)
Neutrophils Relative %: 63 %
Platelets: 259 10*3/uL (ref 150–400)
RBC: 4.85 MIL/uL (ref 3.87–5.11)
RDW: 12.9 % (ref 11.5–15.5)
WBC: 7.6 10*3/uL (ref 4.0–10.5)
nRBC: 0 % (ref 0.0–0.2)

## 2020-12-04 LAB — COMPREHENSIVE METABOLIC PANEL
ALT: 23 U/L (ref 0–44)
AST: 17 U/L (ref 15–41)
Albumin: 4.1 g/dL (ref 3.5–5.0)
Alkaline Phosphatase: 65 U/L (ref 38–126)
Anion gap: 7 (ref 5–15)
BUN: 14 mg/dL (ref 8–23)
CO2: 28 mmol/L (ref 22–32)
Calcium: 9.2 mg/dL (ref 8.9–10.3)
Chloride: 104 mmol/L (ref 98–111)
Creatinine, Ser: 0.74 mg/dL (ref 0.44–1.00)
GFR, Estimated: >60 mL/min (ref >60)
Glucose, Bld: 108 mg/dL — ABNORMAL HIGH (ref 70–99)
Potassium: 4.4 mmol/L (ref 3.5–5.1)
Sodium: 139 mmol/L (ref 135–145)
Total Bilirubin: 0.5 mg/dL (ref 0.3–1.2)
Total Protein: 7.5 g/dL (ref 6.5–8.1)

## 2020-12-04 LAB — LIPASE, BLOOD: Lipase: 38 U/L (ref 11–51)

## 2020-12-04 NOTE — Discharge Instructions (Addendum)
The test today does not show any serious problems.  It is unlikely that you have a parasite however Dr. Jenetta Downer can help you do some testing for that if needed.  Follow-up with your PCP and psychiatrist for further care and treatment

## 2020-12-04 NOTE — ED Provider Notes (Signed)
[facility] EMERGENCY DEPARTMENT Provider Note   CSN: [id number] Arrival date & time: 12/04/20  [id number]     History Chief Complaint  Patient presents with   Abdominal Pain   Nausea    Dawn Kelley is a 60 y.o. female.  HPI She presents for evaluation of ongoing nausea and vomiting for 9 months.  She is worried that she has "a parasite."  She is taking ondansetron, prescribed by her GI doctor about 1 month ago.  She is worried that "my medicines for depression are causing my nausea and vomiting."  She states she has talked to her psychiatrist about them and no changes were made.  She denies fever, blood in emesis, weakness or dizziness.  She is interested in getting some blood work to have screening for problems.  There are no other known active modifying factors.    Past Medical History:  Diagnosis Date   Anxiety    Bipolar 1 disorder ([place])    OCD (obsessive compulsive disorder)    PTSD (post-traumatic stress disorder)     Patient Active Problem List   Diagnosis Date Noted   Moderate episode of recurrent major depressive disorder ([place])    Generalized anxiety disorder    IBS (irritable bowel syndrome) 01/27/2020   Abdominal pain 01/27/2020   Nausea with vomiting 01/22/2020   Pruritus 01/22/2020   Chronic urticaria 01/22/2020    Past Surgical History:  Procedure Laterality Date   BIOPSY  02/03/2020   Procedure: BIOPSY;  Surgeon: Harvel Quale, MD;  Location: [facility];  Service: Gastroenterology;;   COLONOSCOPY WITH PROPOFOL N/A 02/03/2020   Procedure: COLONOSCOPY WITH PROPOFOL;  Surgeon: Harvel Quale, MD;  Location: [facility];  Service: Gastroenterology;  Laterality: N/A;  830   ESOPHAGOGASTRODUODENOSCOPY (EGD) WITH PROPOFOL N/A 02/03/2020   Procedure: ESOPHAGOGASTRODUODENOSCOPY (EGD) WITH PROPOFOL;  Surgeon: Harvel Quale, MD;  Location: [facility];  Service: Gastroenterology;  Laterality: N/A;   INDUCED ABORTION N/A 1980    POLYPECTOMY  02/03/2020   Procedure: POLYPECTOMY;  Surgeon: Harvel Quale, MD;  Location: [facility];  Service: Gastroenterology;;   VEIN SURGERY       OB History     Gravida  4   Para  3   Term  3   Preterm  0   AB  1   Living  3      SAB      IAB      Ectopic      Multiple      Live Births  3           Family History  Problem Relation Age of Onset   Depression Mother    Pneumonia Mother     Social History   Tobacco Use   Smoking status: Never   Smokeless tobacco: Never  Vaping Use   Vaping Use: Never used  Substance Use Topics   Alcohol use: Not Currently   Drug use: Not Currently    Home Medications Prior to Admission medications   Medication Sig Start Date End Date Taking? Authorizing Provider  acetaminophen (TYLENOL) 325 MG tablet Take 325 mg by mouth every 6 (six) hours as needed. 975 mg  per day for one week per patient.    [provider]  clonazePAM (KLONOPIN) 0.5 MG tablet Take 0.5 mg by mouth in the morning, at noon, and at bedtime. 03/29/20   [provider]  famotidine (PEPCID) 20 MG tablet Take 20 mg by  mouth daily. As needed.    [provider]  Glucosamine-Chondroitin (GLUCOSAMINE CHONDR COMPLEX PO) Take 1 capsule by mouth daily.    [provider]  hydrOXYzine (ATARAX/VISTARIL) 50 MG tablet Take 1 tablet (50 mg total) by mouth at bedtime. Patient taking differently: Take 50 mg by mouth 2 (two) times daily. 01/22/20   Valentina Shaggy, MD  lamoTRIgine (LAMICTAL) 100 MG tablet Take 100 mg by mouth 2 (two) times daily.    [provider]  levothyroxine (SYNTHROID) 25 MCG tablet Take 25 mcg by mouth daily before breakfast.    [provider]  Multiple Vitamin (MULTIVITAMIN WITH MINERALS) TABS tablet Take 1 tablet by mouth daily.    [provider]  Omega-3 Fatty Acids (FISH OIL) 1000 MG CAPS Take 1,000 mg by mouth 3 (three) times daily.    [provider]  ondansetron (ZOFRAN-ODT) 4 MG disintegrating tablet Take 1 tablet (4 mg total) by mouth every 8 (eight) hours as needed for nausea or vomiting. 11/07/20   Montez Morita, Quillian Quince, MD  OVER THE COUNTER MEDICATION Probiotic 1 po Qd per patient.    [provider]  OVER THE COUNTER MEDICATION B 12 one po QD per patient . Patient not taking: Reported on 11/07/2020    [provider]  OVER THE COUNTER MEDICATION Vit D 2 1.2/50,000 IU once per week.    [provider]  pantoprazole (PROTONIX) 40 MG tablet Take 1 tablet (40 mg total) by mouth daily. Patient not taking: Reported on 11/07/2020 02/08/20   Montez Morita, Quillian Quince, MD  propranolol (INDERAL) 20 MG tablet Take 20 mg by mouth 2 (two) times daily. 03/29/20   [provider]  risperiDONE (RISPERDAL) 0.5 MG tablet Take 1 tablet (0.5 mg total) by mouth at bedtime. Patient not taking: Reported on 11/07/2020 02/27/20   Money, Lowry Ram, FNP  tiZANidine (ZANAFLEX) 4 MG tablet Take 4 mg by mouth 3 (three) times daily. 08/24/20   [provider]  Turmeric 500 MG CAPS Take 1,000 mg by mouth. Daily per patient.    [provider]  VRAYLAR capsule Take 4.5 mg by mouth daily. 08/30/20   [provider]    Allergies    Escitalopram, Seroquel [quetiapine fumarate], and Trazodone  Review of Systems   Review of Systems  All other systems reviewed and are negative.  Physical Exam Updated Vital Signs BP (!) 105/52 (BP Location: Left Arm)   Pulse (!) 50   Temp 97.7 F (36.5 C)   Resp 20   Wt 81.6 kg   SpO2 100%   BMI 29.05 kg/m   Physical Exam Vitals and nursing note reviewed.  Constitutional:      Appearance: She is well-developed. She is not ill-appearing.  HENT:     Head: Normocephalic and atraumatic.     Right Ear: External ear normal.     Left Ear: External ear normal.  Eyes:     Conjunctiva/sclera: Conjunctivae normal.     Pupils: Pupils are equal, round, and  reactive to light.  Neck:     Trachea: Phonation normal.  Cardiovascular:     Rate and Rhythm: Normal rate.  Pulmonary:     Effort: Pulmonary effort is normal.  Abdominal:     General: There is no distension.     Palpations: Abdomen is soft.  Musculoskeletal:        General: Normal range of motion.     Cervical back: Normal range of motion and neck supple.  Skin:    General: Skin is warm and dry.  Neurological:     Mental Status: She is alert and oriented to person, place, and time.     Cranial Nerves: No cranial nerve deficit.     Sensory: No sensory deficit.     Motor: No abnormal muscle tone.     Coordination: Coordination normal.  Psychiatric:        Mood and Affect: Mood normal.        Behavior: Behavior normal.        Thought Content: Thought content normal.        Judgment: Judgment normal.    ED Results / Procedures / Treatments   Labs (all labs ordered are listed, but only abnormal results are displayed) Labs Reviewed  COMPREHENSIVE METABOLIC PANEL - Abnormal; Notable for the following components:      Result Value   Glucose, Bld 108 (*)    All other components within normal limits  LIPASE, BLOOD  CBC WITH DIFFERENTIAL/PLATELET    EKG None  Radiology No results found.  Procedures Procedures   Medications Ordered in ED Medications - No data to display  ED Course  I have reviewed the triage vital signs and the nursing notes.  Pertinent labs & imaging results that were available during my care of the patient were reviewed by me and considered in my medical decision making (see chart for details).  Clinical Course as of 12/04/20 [id number]  Nancy Fetter Dec 04, 2020  0743 Nontoxic patient with chronic problems, agreed to evaluate her for occult issues, then discharge.  Patient was comfortable with plan [EW]    Clinical Course User Index [EW] Daleen Bo, MD   MDM Rules/Calculators/A&P                            Patient Vitals for the past 24 hrs:  BP  Temp Pulse Resp SpO2 Weight  12/04/20 0824 (!) 105/52 -- (!) 50 20 100 % --  12/04/20 0728 124/72 97.7 F (36.5 C) 61 19 100 % --  12/04/20 0724 -- -- -- -- -- 81.6 kg    8:37 AM Reevaluation with update and discussion. After initial assessment and treatment, an updated evaluation reveals no change in clinical status, findings discussed and questions answered. Daleen Bo   Medical Decision Making:  This patient is presenting for evaluation of chronic nausea and vomiting, which does require a range of treatment options, and is a complaint that involves a moderate risk of morbidity and mortality. The differential diagnoses include metabolic disorder, occult infection, hemodynamic instability. I decided to review old records, and in summary elderly female presenting with persistent symptoms after seeing her GI doctor 1 month ago to follow-up on irritable bowel syndrome with constipation and diarrhea.  She has bipolar disorder, OCD and posttraumatic stress disorder.  She sees psychiatry regularly..  I did not require additional historical information from anyone.  Clinical Laboratory Tests Ordered, included . Review indicates normal finding.   Critical Interventions-clinical evaluation, laboratory testing, discussion with patient  After These Interventions, the Patient was reevaluated and was found stable for discharge.  Patient with ongoing symptoms without acute abnormalities on clinical exam or screening blood work.  CRITICAL CARE-no Performed by: Daleen Bo  Nursing Notes Reviewed/ Care Coordinated Applicable Imaging Reviewed Interpretation of Laboratory Data incorporated into ED treatment  The patient appears reasonably screened and/or stabilized for discharge and I doubt any other medical condition or other  EMC requiring further screening, evaluation, or treatment in the ED at this time prior to discharge.  Plan: Home Medications-continue usual; Home Treatments-rest, fluid;  return here if the recommended treatment, does not improve the symptoms; Recommended follow up-PCP, GI, psychiatry, as needed     Final Clinical Impression(s) / ED Diagnoses Final diagnoses:  Malaise    Rx / DC Orders ED Discharge Orders     None        Daleen Bo, MD 12/04/20 [phone]

## 2020-12-04 NOTE — ED Triage Notes (Signed)
PT reports intermittent nausea, vomiting, and diarrhea for the past 9 months.

## 2020-12-12 ENCOUNTER — Other Ambulatory Visit (INDEPENDENT_AMBULATORY_CARE_PROVIDER_SITE_OTHER): Payer: Self-pay

## 2020-12-12 DIAGNOSIS — K219 Gastro-esophageal reflux disease without esophagitis: Secondary | ICD-10-CM

## 2020-12-12 MED ORDER — PANTOPRAZOLE SODIUM 40 MG PO TBEC: 40.0000 mg | DELAYED_RELEASE_TABLET | Freq: Every day | ORAL | 2 refills | Status: DC

## 2020-12-14 DIAGNOSIS — L820 Inflamed seborrheic keratosis: Secondary | ICD-10-CM | POA: Diagnosis not present

## 2020-12-14 DIAGNOSIS — Z1283 Encounter for screening for malignant neoplasm of skin: Secondary | ICD-10-CM | POA: Diagnosis not present

## 2020-12-14 DIAGNOSIS — D2272 Melanocytic nevi of left lower limb, including hip: Secondary | ICD-10-CM | POA: Diagnosis not present

## 2020-12-14 DIAGNOSIS — D485 Neoplasm of uncertain behavior of skin: Secondary | ICD-10-CM | POA: Diagnosis not present

## 2020-12-14 DIAGNOSIS — D225 Melanocytic nevi of trunk: Secondary | ICD-10-CM | POA: Diagnosis not present

## 2020-12-19 DIAGNOSIS — G250 Essential tremor: Secondary | ICD-10-CM | POA: Diagnosis not present

## 2020-12-19 DIAGNOSIS — Z79899 Other long term (current) drug therapy: Secondary | ICD-10-CM | POA: Diagnosis not present

## 2020-12-19 DIAGNOSIS — R413 Other amnesia: Secondary | ICD-10-CM | POA: Diagnosis not present

## 2021-01-04 DIAGNOSIS — E038 Other specified hypothyroidism: Secondary | ICD-10-CM | POA: Diagnosis not present

## 2021-01-04 DIAGNOSIS — I700 Atherosclerosis of aorta: Secondary | ICD-10-CM | POA: Diagnosis not present

## 2021-01-04 DIAGNOSIS — I10 Essential (primary) hypertension: Secondary | ICD-10-CM | POA: Diagnosis not present

## 2021-01-23 DIAGNOSIS — M9901 Segmental and somatic dysfunction of cervical region: Secondary | ICD-10-CM | POA: Diagnosis not present

## 2021-01-23 DIAGNOSIS — M9903 Segmental and somatic dysfunction of lumbar region: Secondary | ICD-10-CM | POA: Diagnosis not present

## 2021-01-23 DIAGNOSIS — M9902 Segmental and somatic dysfunction of thoracic region: Secondary | ICD-10-CM | POA: Diagnosis not present

## 2021-01-23 DIAGNOSIS — S134XXA Sprain of ligaments of cervical spine, initial encounter: Secondary | ICD-10-CM | POA: Diagnosis not present

## 2021-01-23 DIAGNOSIS — S233XXA Sprain of ligaments of thoracic spine, initial encounter: Secondary | ICD-10-CM | POA: Diagnosis not present

## 2021-01-23 DIAGNOSIS — S338XXA Sprain of other parts of lumbar spine and pelvis, initial encounter: Secondary | ICD-10-CM | POA: Diagnosis not present

## 2021-01-24 DIAGNOSIS — R58 Hemorrhage, not elsewhere classified: Secondary | ICD-10-CM | POA: Diagnosis not present

## 2021-01-24 DIAGNOSIS — L299 Pruritus, unspecified: Secondary | ICD-10-CM | POA: Diagnosis not present

## 2021-01-24 DIAGNOSIS — R109 Unspecified abdominal pain: Secondary | ICD-10-CM | POA: Diagnosis not present

## 2021-01-25 DIAGNOSIS — M9901 Segmental and somatic dysfunction of cervical region: Secondary | ICD-10-CM | POA: Diagnosis not present

## 2021-01-25 DIAGNOSIS — S233XXA Sprain of ligaments of thoracic spine, initial encounter: Secondary | ICD-10-CM | POA: Diagnosis not present

## 2021-01-25 DIAGNOSIS — M9903 Segmental and somatic dysfunction of lumbar region: Secondary | ICD-10-CM | POA: Diagnosis not present

## 2021-01-25 DIAGNOSIS — S134XXA Sprain of ligaments of cervical spine, initial encounter: Secondary | ICD-10-CM | POA: Diagnosis not present

## 2021-01-25 DIAGNOSIS — M9902 Segmental and somatic dysfunction of thoracic region: Secondary | ICD-10-CM | POA: Diagnosis not present

## 2021-02-03 DIAGNOSIS — K589 Irritable bowel syndrome without diarrhea: Secondary | ICD-10-CM | POA: Diagnosis not present

## 2021-02-03 DIAGNOSIS — M797 Fibromyalgia: Secondary | ICD-10-CM | POA: Diagnosis not present

## 2021-02-03 DIAGNOSIS — F32A Depression, unspecified: Secondary | ICD-10-CM | POA: Diagnosis not present

## 2021-02-03 DIAGNOSIS — Z79899 Other long term (current) drug therapy: Secondary | ICD-10-CM | POA: Diagnosis not present

## 2021-02-03 DIAGNOSIS — Z20822 Contact with and (suspected) exposure to covid-19: Secondary | ICD-10-CM | POA: Diagnosis not present

## 2021-02-03 DIAGNOSIS — Z888 Allergy status to other drugs, medicaments and biological substances status: Secondary | ICD-10-CM | POA: Diagnosis not present

## 2021-02-03 DIAGNOSIS — Z87891 Personal history of nicotine dependence: Secondary | ICD-10-CM | POA: Diagnosis not present

## 2021-02-09 DIAGNOSIS — Z23 Encounter for immunization: Secondary | ICD-10-CM | POA: Diagnosis not present

## 2021-02-15 ENCOUNTER — Telehealth (INDEPENDENT_AMBULATORY_CARE_PROVIDER_SITE_OTHER): Payer: Self-pay

## 2021-02-15 NOTE — Telephone Encounter (Signed)
Thanks [employer]

## 2021-02-15 NOTE — Telephone Encounter (Signed)
Mitzie please place patient on cancellation list for Dr. Jenetta Downer and Vikki Ports. Thanks   Patient called today crying stating she was just released last Thursday from her eighth mental health admission to the hospital due to anxiety and depression. She was stating she has stomach pain and did not know what was causing it I advised that anxiety can cause Gi upset, that she needed to try to get the anxiety under control, she wanted an appt with Dr Jenetta Downer or [facility]. I advise nothing for the next month. We would place on a cancellation list, and advised I would speak with Dr Jenetta Downer regarding. She states understanding.

## 2021-02-19 DIAGNOSIS — R9431 Abnormal electrocardiogram [ECG] [EKG]: Secondary | ICD-10-CM | POA: Diagnosis not present

## 2021-02-19 DIAGNOSIS — R079 Chest pain, unspecified: Secondary | ICD-10-CM | POA: Diagnosis not present

## 2021-02-19 DIAGNOSIS — R0789 Other chest pain: Secondary | ICD-10-CM | POA: Diagnosis not present

## 2021-02-20 ENCOUNTER — Other Ambulatory Visit: Payer: Self-pay

## 2021-02-20 ENCOUNTER — Encounter (INDEPENDENT_AMBULATORY_CARE_PROVIDER_SITE_OTHER): Payer: Self-pay | Admitting: Gastroenterology

## 2021-02-20 ENCOUNTER — Ambulatory Visit (INDEPENDENT_AMBULATORY_CARE_PROVIDER_SITE_OTHER): Payer: Medicare Other | Admitting: Gastroenterology

## 2021-02-20 VITALS — BP 126/58 | HR 72 | Temp 98.0°F | Ht 66.0 in | Wt 192.0 lb

## 2021-02-20 DIAGNOSIS — K219 Gastro-esophageal reflux disease without esophagitis: Secondary | ICD-10-CM | POA: Diagnosis not present

## 2021-02-20 DIAGNOSIS — K582 Mixed irritable bowel syndrome: Secondary | ICD-10-CM

## 2021-02-20 MED ORDER — FAMOTIDINE 40 MG PO TABS: 40.0000 mg | ORAL_TABLET | Freq: Every day | ORAL | 3 refills | Status: DC

## 2021-02-20 NOTE — Progress Notes (Signed)
Referring Provider: Neale Burly, MD Primary Care Physician:  Neale Burly, MD Primary GI Physician:   Chief Complaint  Patient presents with   Abdominal Pain    Mid abdominal pain. Having either constipation or diarrhea for about 9 months. Having acid reflux. Pt has concerns about maybe having IBS, crohn's, or u/c. States pharmacist told her she should not be on protonix.    HPI:   Dawn Kelley is a 60 y.o. female with past medical history of severe anxiety, bipolar disorder, PTSD (hx of ECT therapy which caused memory loss), fibromyalgia, gerd and IBS-M  Patient presenting today for follow up of IBS-M and GERD. Last seen in June 2022.   She reports that she has had a lot of issues with her anxiety recently. She wakes up in the mornings with epigastric pain which typically causes her to begin feeling anxious. She states she had previously been taking her vitamins on an empty stomach which she thinks may have irritated her reflux. She is concerned about taking protonix since she has been on this long term, she spoke with her pharmacist who advised her that PPIs should not be taken long term, she took last protonix pill yesterday and does not wish to take a PPI at this time. She is taking pepcid 20mg  and mylanta max, however, she is still having some epigastric pain and symptoms of reflux. She is trying to avoid spicy, citrus, acidic foods and drinks as she knows these cause worsening of her symptoms.   She has previously had a mix of constipation and diarrhea, she is having more episodes of diarrhea now. She last had some formed stool last week, she has had more diarrhea this week, possibly 4-6 episodes per day. She took some otc anti diarrheals yesterday which helped some.  She endorses some generalized abdominal pain at times. Previously had some nausea that has resolved. She reports that she does not have much of an appetite but denies early satiety or weight loss. she is attempting  to stop some of the vitamins and extra things she is taking in order to avoid taking pills she does not need. Patient is concerned about possible UC or Crohn's as her pharmacist advised her that severe anxiety and GI symptoms typically indicates presence of one of these. She denies melena or hematochezia.  She states that she had to go to the ER yesterday at [facility] because she was feeling so anxious and having some SI. She was evaluated there and released. She is currently seeing a psychiatrist for ongoing therapy with previous ECT therapy that induced some memory loss.   Last Colonoscopy:02/03/20 35mm polyp in sigmoid colon as well as hemorrhoids, histology consistent with tubular adenoma Last Endoscopy:02/03/20 small gastric polyps, sessile and fundic gland, sm bowel bx normal  Recommendations:  Repeat colonoscopy in 2028  Past Medical History:  Diagnosis Date   Anxiety    Bipolar 1 disorder ([place])    OCD (obsessive compulsive disorder)    PTSD (post-traumatic stress disorder)     Past Surgical History:  Procedure Laterality Date   BIOPSY  02/03/2020   Procedure: BIOPSY;  Surgeon: Harvel Quale, MD;  [facility];  Service: Gastroenterology;;   COLONOSCOPY WITH PROPOFOL N/A 02/03/2020   Procedure: COLONOSCOPY WITH PROPOFOL;  Surgeon: Harvel Quale, MD;  [facility];  Service: Gastroenterology;  Laterality: N/A;  830   ESOPHAGOGASTRODUODENOSCOPY (EGD) WITH PROPOFOL N/A 02/03/2020   Procedure: ESOPHAGOGASTRODUODENOSCOPY (EGD) WITH PROPOFOL;  Surgeon: Montez Morita, Quillian Quince, MD;  [facility];  Service: Gastroenterology;  Laterality: N/A;   INDUCED ABORTION N/A 1980   POLYPECTOMY  02/03/2020   Procedure: POLYPECTOMY;  Surgeon: Montez Morita, Quillian Quince, MD;  [facility];  Service: Gastroenterology;;   VEIN SURGERY      Current Outpatient Medications  Medication Sig Dispense Refill   acetaminophen (TYLENOL) 325 MG tablet  Take 325 mg by mouth every 6 (six) hours as needed. 975 mg  per day for one week per patient.     busPIRone (BUSPAR) 10 MG tablet Take 10 mg by mouth 3 (three) times daily.     carbamazepine (TEGRETOL) 200 MG tablet Take 200 mg by mouth in the morning and at bedtime.     clonazePAM (KLONOPIN) 0.5 MG tablet Take 0.5 mg by mouth in the morning, at noon, and at bedtime.     diphenhydrAMINE (BENADRYL) 25 mg capsule Take 25 mg by mouth. Take as needed     famotidine (PEPCID) 20 MG tablet Take 20 mg by mouth daily. As needed.     lamoTRIgine (LAMICTAL) 100 MG tablet Take 100 mg by mouth 2 (two) times daily.     levothyroxine (SYNTHROID) 25 MCG tablet Take 25 mcg by mouth daily before breakfast.     mirtazapine (REMERON) 45 MG tablet Take 45 mg by mouth at bedtime.     ondansetron (ZOFRAN-ODT) 4 MG disintegrating tablet Take 1 tablet (4 mg total) by mouth every 8 (eight) hours as needed for nausea or vomiting. 60 tablet 3   OVER THE COUNTER MEDICATION Vit D 2 1.2/50,000 IU once per week.     propranolol (INDERAL) 20 MG tablet Take 20 mg by mouth 2 (two) times daily. 10mg  one tid     risperiDONE (RISPERDAL) 0.5 MG tablet Take 1 tablet (0.5 mg total) by mouth at bedtime. 30 tablet 0   rosuvastatin (CRESTOR) 20 MG tablet Take 20 mg by mouth daily.     venlafaxine XR (EFFEXOR-XR) 150 MG 24 hr capsule Take 150 mg by mouth daily with breakfast.     Glucosamine-Chondroitin (GLUCOSAMINE CHONDR COMPLEX PO) Take 1 capsule by mouth daily. (Patient not taking: Reported on 02/20/2021)     hydrOXYzine (ATARAX/VISTARIL) 50 MG tablet Take 1 tablet (50 mg total) by mouth at bedtime. (Patient not taking: Reported on 02/20/2021) 90 tablet 2   Multiple Vitamin (MULTIVITAMIN WITH MINERALS) TABS tablet Take 1 tablet by mouth daily. (Patient not taking: Reported on 02/20/2021)     Omega-3 Fatty Acids (FISH OIL) 1000 MG CAPS Take 1,000 mg by mouth 3 (three) times daily. (Patient not taking: Reported on 02/20/2021)     OVER THE  COUNTER MEDICATION Probiotic 1 po Qd per patient. (Patient not taking: Reported on 02/20/2021)     OVER THE COUNTER MEDICATION B 12 one po QD per patient . (Patient not taking: No sig reported)     pantoprazole (PROTONIX) 40 MG tablet Take 1 tablet (40 mg total) by mouth daily. (Patient not taking: Reported on 02/20/2021) 90 tablet 2   tiZANidine (ZANAFLEX) 4 MG tablet Take 4 mg by mouth 3 (three) times daily. (Patient not taking: Reported on 02/20/2021)     Turmeric 500 MG CAPS Take 1,000 mg by mouth. Daily per patient. (Patient not taking: Reported on 02/20/2021)     No current facility-administered medications for this visit.    Allergies as of 02/20/2021 - Review Complete 02/20/2021  Allergen Reaction Noted   Escitalopram Nausea And Vomiting  12/28/2019   Seroquel [quetiapine fumarate] Shortness Of Breath 09/26/2019   Trazodone Other (See Comments) 02/15/2020    Family History  Problem Relation Age of Onset   Depression Mother    Pneumonia Mother     Social History   Socioeconomic History   Marital status: Married    Spouse name: Not on file   Number of children: Not on file   Years of education: Not on file   Highest education level: Not on file  Occupational History   Occupation: Disabled  Tobacco Use   Smoking status: Never   Smokeless tobacco: Never  Vaping Use   Vaping Use: Never used  Substance and Sexual Activity   Alcohol use: Not Currently   Drug use: Not Currently   Sexual activity: Not Currently    Birth control/protection: Post-menopausal  Other Topics Concern   Not on file  Social History Narrative   Pt lives in [place] with husband.  She is on disability.  Pt stated that she receives outpatient psychiatry services through [facility].  She does not receive outpatient therapy.   Social Determinants of Health   Financial Resource Strain: Low Risk    Difficulty of Paying Living Expenses: Not hard at all  Food Insecurity: No Food Insecurity    Worried About Charity fundraiser in the Last Year: Never true   [place] in the Last Year: Never true  Transportation Needs: No Transportation Needs   Lack of Transportation (Medical): No   Lack of Transportation (Non-Medical): No  Physical Activity: Inactive   Days of Exercise per Week: 0 days   Minutes of Exercise per Session: 0 min  Stress: Unknown   Feeling of Stress : Patient refused  Social Connections: Unknown   Frequency of Communication with Friends and Family: More than three times a week   Frequency of Social Gatherings with Friends and Family: Once a week   Attends Religious Services: Never   Marine scientist or Organizations: No   Attends Archivist Meetings: Never   Marital Status: Patient refused   Review of Systems: Gen: Denies fever, chills, anorexia. Denies fatigue, weakness, weight loss.  CV: Denies chest pain, palpitations, syncope, peripheral edema, and claudication. Resp: Denies dyspnea at rest, cough, wheezing, coughing up blood, and pleurisy. GI: Denies melena, hematochezia, nausea, vomiting,  constipation, dysphagia, odyonophagia, early satiety or weight loss. +diarrhea +decreased appetite Derm: Denies rash, itching, dry skin Psych: +anxiety and depression, no current SI. Followed by psych Heme: Denies bruising, bleeding, and enlarged lymph nodes.  Physical Exam: BP (!) 126/58 (BP Location: Right Arm, Patient Position: Sitting, Cuff Size: Large)   Pulse 72   Temp 98 F (36.7 C) (Oral)   Ht 5\' 6"  (1.676 m)   Wt 192 lb (87.1 kg)   BMI 30.99 kg/m  General:   Alert and oriented. No distress noted. Pleasant and cooperative.  Head:  Normocephalic and atraumatic. Eyes:  Conjuctiva clear without scleral icterus. Mouth:  Oral mucosa pink and moist. Good dentition. No lesions. Heart: Normal rate and rhythm, s1 and s2 heart sounds present.  Lungs: Clear lung sounds in all lobes. Respirations equal and unlabored. Abdomen:  +BS, soft, and  non-distended. No rebound or guarding. No HSM or masses noted. Generalized TPP with deep palpation Derm: No palmar erythema or jaundice Msk:  Symmetrical without gross deformities. Normal posture. Extremities:  Without edema. Neurologic:  Alert and  oriented x4 Psych:  Alert and cooperative. Normal mood and  affect.  Invalid input(s): 6 MONTHS   ASSESSMENT: Dawn Kelley is a 60 y.o. female presenting today for follow up of GERD and IBS-M.   Patient reports she was on protonix 40mg  once daily, states her pharmacist told her she should not be on this long term so she does not wish to continue on PPI as her last protonix dose was yesterday. She is taking pepcid 20mg  once daily as well as mylanta max to help with her epigastric pain/reflux, however, she continues to have symptoms of reflux and some epigastric pain. We discussed indications of PPI as well as risks and benefits, however, patient prefers not to restart PPI at this time, we will increase her Pepcid to 40mg  once daily, I advised her to avoid taking mylanta on a regular basis. She should continue to avoid greasy, spicy, citrus and acidic foods.  She is continuing to experience a great deal of anxiety despite multiple medications and therapy. She had previous ECT therapy which affected her memory. She inquired about anyone who does EMDR therapy, I provided her with a resource for this as requested. Patient was concerned that she may have Crohn's or UC as her pharmacist had told her that severe anxiety often goes along with these, I discussed the results of patient's EGD and colonoscopy from sept 2021 and reassured her there was no evidence to suggest IBD. We discussed that her symptoms are likely related to IBS and given her significant mental health history, her symptoms are largely related to this. Given the amount of medication she is currently on, I would like to avoid anti cholinergics such as bentyl/levsin at this time. I recommended  IBgard otc to try for her IBS symptoms. she can continue otc imodium as needed for diarrhea.  She tried low FODMAP diet in the past, however, attempting to follow the diet increased her anxiety so strict dietary measures should be avoided in her case. She should keep track of foods that seem to be triggers of her symptoms and avoid these.   In regards to her decreased appetite, this is likely related to her anxiety/depression and multiple medications she is on. She is not having any weight loss or alarm systems and had recent EGD and colonoscopy without remarkable findings, we will continue to monitor this.   No red flag symptoms. Patient denies melena, hematochezia, nausea, vomiting,  constipation, dysphagia, odyonophagia, early satiety or weight loss.    PLAN:  Rx Pepcid 40mg  once daily 2. Try IB Gard otc for IBS symptoms 3. Keep track of foods that seem to be triggers of IBS/reflux and attempt to avoid these 4. Can take imodium otc as needed 5. Continue to follow with psychiatrist, contact [facility] regarding [facility] therapy (contact information provided per patient's request)  Follow Up: 4 months  Virgil Slinger L. Alver Sorrow, MSN, APRN, AGNP-C Adult-Gerontology Nurse Practitioner [facility]

## 2021-02-20 NOTE — Patient Instructions (Addendum)
-  Try to avoid taking mylanta, I have sent pepcid 40mg  to your pharmacy, please take this once a day, 30-45 minutes prior to breakfast in the mornings, you can take it after your thyroid medication. If your reflux symptoms are not controlled with this, let me know.  -your last colonoscopy in 2021 showed no evidence of Crohn's or Ulcerative colitis, I suspect you are experiencing IBS. You can continue to take otc imodium for your diarrhea. I would also like for you to try IBgard, this is over the counter and contains peppermint oil which can help with your symptoms. If you do not notice that symptoms are improving with this, please let me know.   Follow up 4 months    I am providing the phone number for Krystal Eaton, he is a therapist that does Rapid Eye movement therapy (EMDR) in [place] **[facility] [phone]

## 2021-02-22 DIAGNOSIS — R079 Chest pain, unspecified: Secondary | ICD-10-CM | POA: Diagnosis not present

## 2021-02-27 DIAGNOSIS — R079 Chest pain, unspecified: Secondary | ICD-10-CM | POA: Diagnosis not present

## 2021-03-13 DIAGNOSIS — Z1231 Encounter for screening mammogram for malignant neoplasm of breast: Secondary | ICD-10-CM | POA: Diagnosis not present

## 2021-03-13 LAB — HM MAMMOGRAPHY

## 2021-03-15 ENCOUNTER — Encounter (HOSPITAL_COMMUNITY): Payer: Self-pay | Admitting: *Deleted

## 2021-03-15 ENCOUNTER — Other Ambulatory Visit: Payer: Self-pay

## 2021-03-15 ENCOUNTER — Emergency Department (HOSPITAL_COMMUNITY)
Admission: EM | Admit: 2021-03-15 | Discharge: 2021-03-15 | Disposition: A | Discharge status: Home / Self Care | Payer: Medicare Other | Attending: Emergency Medicine | Admitting: Emergency Medicine

## 2021-03-15 ENCOUNTER — Emergency Department (HOSPITAL_COMMUNITY)
Admission: EM | Admit: 2021-03-15 | Discharge: 2021-03-15 | Discharge status: Against Medical Advice | Payer: Medicare Other

## 2021-03-15 DIAGNOSIS — F99 Mental disorder, not otherwise specified: Secondary | ICD-10-CM | POA: Insufficient documentation

## 2021-03-15 DIAGNOSIS — R45851 Suicidal ideations: Secondary | ICD-10-CM | POA: Diagnosis not present

## 2021-03-15 DIAGNOSIS — F419 Anxiety disorder, unspecified: Secondary | ICD-10-CM | POA: Insufficient documentation

## 2021-03-15 DIAGNOSIS — Z5321 Procedure and treatment not carried out due to patient leaving prior to being seen by health care provider: Secondary | ICD-10-CM | POA: Insufficient documentation

## 2021-03-15 DIAGNOSIS — F32A Depression, unspecified: Secondary | ICD-10-CM | POA: Insufficient documentation

## 2021-03-15 HISTORY — DX: Depression, unspecified: F32.A

## 2021-03-15 NOTE — ED Triage Notes (Signed)
Pt's husband reports pt is having a "major breakdown", worsening this morning. Pt reports she can no longer function in daily life. She c/o severe anxiety and depression. Pt reports she was recently at [facility] and when she was discharged she was told to not come back because there was nothing else they could do to help her. She does report feeling suicidal for the last month. Reports she has a plan, but states, "I don't want to talk to you about it right now".

## 2021-03-16 DIAGNOSIS — E559 Vitamin D deficiency, unspecified: Secondary | ICD-10-CM | POA: Diagnosis not present

## 2021-03-16 DIAGNOSIS — Z8673 Personal history of transient ischemic attack (TIA), and cerebral infarction without residual deficits: Secondary | ICD-10-CM | POA: Diagnosis not present

## 2021-03-16 DIAGNOSIS — E785 Hyperlipidemia, unspecified: Secondary | ICD-10-CM | POA: Diagnosis not present

## 2021-03-16 DIAGNOSIS — Z20822 Contact with and (suspected) exposure to covid-19: Secondary | ICD-10-CM | POA: Diagnosis not present

## 2021-03-16 DIAGNOSIS — R9431 Abnormal electrocardiogram [ECG] [EKG]: Secondary | ICD-10-CM | POA: Diagnosis not present

## 2021-03-16 DIAGNOSIS — R001 Bradycardia, unspecified: Secondary | ICD-10-CM | POA: Diagnosis not present

## 2021-03-16 DIAGNOSIS — Z79899 Other long term (current) drug therapy: Secondary | ICD-10-CM | POA: Diagnosis not present

## 2021-03-16 DIAGNOSIS — E039 Hypothyroidism, unspecified: Secondary | ICD-10-CM | POA: Diagnosis not present

## 2021-03-16 DIAGNOSIS — I4892 Unspecified atrial flutter: Secondary | ICD-10-CM | POA: Diagnosis not present

## 2021-03-27 ENCOUNTER — Telehealth (INDEPENDENT_AMBULATORY_CARE_PROVIDER_SITE_OTHER): Payer: Self-pay | Admitting: *Deleted

## 2021-03-27 DIAGNOSIS — Z9151 Personal history of suicidal behavior: Secondary | ICD-10-CM | POA: Diagnosis not present

## 2021-03-27 NOTE — Telephone Encounter (Signed)
I returned call, no answer,left message if she still needed assistance to please call the office.

## 2021-03-27 NOTE — Telephone Encounter (Signed)
Patient has urgent question - please call [phone]

## 2021-03-28 ENCOUNTER — Encounter (HOSPITAL_COMMUNITY): Payer: Self-pay | Admitting: Emergency Medicine

## 2021-03-28 ENCOUNTER — Other Ambulatory Visit: Payer: Self-pay

## 2021-03-28 ENCOUNTER — Emergency Department (HOSPITAL_COMMUNITY)
Admission: EM | Admit: 2021-03-28 | Discharge: 2021-03-28 | Disposition: A | Discharge status: Home / Self Care | Payer: Medicare Other | Attending: Emergency Medicine | Admitting: Emergency Medicine

## 2021-03-28 DIAGNOSIS — F419 Anxiety disorder, unspecified: Secondary | ICD-10-CM | POA: Insufficient documentation

## 2021-03-28 DIAGNOSIS — Z79899 Other long term (current) drug therapy: Secondary | ICD-10-CM | POA: Insufficient documentation

## 2021-03-28 NOTE — Discharge Instructions (Signed)
Increase your clonazepam to 0.5 mg, twice a day, as before.  See your psychiatrist next Monday as planned.

## 2021-03-28 NOTE — ED Triage Notes (Signed)
Pt c/o feeling anxious. Recent medication change x 2 weeks ago. C/o nausea. Denies SI/Hi

## 2021-03-28 NOTE — Telephone Encounter (Signed)
Patient left a message on vm this am stating she was at the Ed at [facility] and she was having a nervous breakdown. She states she has no other Doctors that she can count on, and wanted to make sure we put in a good word for her with the hospital so they will treat her well. I have made Dr.[name] aware of this.

## 2021-03-28 NOTE — ED Provider Notes (Signed)
[facility] EMERGENCY DEPARTMENT Provider Note   CSN: [id number] Arrival date & time: 03/28/21  4098     History Chief Complaint  Patient presents with   Anxiety    Dawn Kelley is a 60 y.o. female.  HPI Patient presents for evaluation of "panic attack."  She reports worsening symptoms for 1 week since lowering her daily, clonazepam from 0.5 mg twice daily to 0.5 mg daily.  She has been having periods of waking up at night, feeling panicky and having chest discomfort.  According to her husband who is with her, she has not had this happen in the last 2-1/2 years.  She had previously been on 1 mg clonazepam twice daily until January of this year when it was lowered to 1/2 mg twice daily.  She is seeing psychiatry and has an appointment with him, on 04/03/2021.  She was hospitalized on 02/28/2021 until 03/02/2021 by the psychiatry service at [facility].  At that time she was treated for panic disorder, major depressive disorder and posttraumatic stress disorder.  It was noted that and that she had recently failed transcranial magnetic stimulation therapy, and had prior failures with ketamine and ECT.  The outcome of the hospitalization was improvement with treatment including group therapy and medication modifications: Remeron reduced to 7.5 mg nightly and Cymbalta 40 mg/day was started    Past Medical History:  Diagnosis Date   Anxiety    Bipolar 1 disorder (HCC)    Depression    OCD (obsessive compulsive disorder)    PTSD (post-traumatic stress disorder)    S/P ECT (electroconvulsive therapy)     Patient Active Problem List   Diagnosis Date Noted   Gastroesophageal reflux disease without esophagitis 02/20/2021   Moderate episode of recurrent major depressive disorder (HCC)    Generalized anxiety disorder    Irritable bowel syndrome with both constipation and diarrhea 01/27/2020   Abdominal pain 01/27/2020   Nausea with vomiting 01/22/2020   Pruritus 01/22/2020   Chronic  urticaria 01/22/2020    Past Surgical History:  Procedure Laterality Date   BIOPSY  02/03/2020   Procedure: BIOPSY;  Surgeon: Harvel Quale, MD;  Location: [facility];  Service: Gastroenterology;;   COLONOSCOPY WITH PROPOFOL N/A 02/03/2020   [name]: 85mm polyp in sigmoid colon  as well as hemorrhoids, histology consistent with tubular adenoma   ESOPHAGOGASTRODUODENOSCOPY (EGD) WITH PROPOFOL N/A 02/03/2020   castaneda: small gastric polyps, sessile, and fundic gland, small bowel bx normal   INDUCED ABORTION N/A 1980   POLYPECTOMY  02/03/2020   Procedure: POLYPECTOMY;  Surgeon: Harvel Quale, MD;  Location: [facility];  Service: Gastroenterology;;   VEIN SURGERY       OB History     Gravida  4   Para  3   Term  3   Preterm  0   AB  1   Living  3      SAB      IAB      Ectopic      Multiple      Live Births  3           Family History  Problem Relation Age of Onset   Depression Mother    Pneumonia Mother     Social History   Tobacco Use   Smoking status: Never   Smokeless tobacco: Never  Vaping Use   Vaping Use: Never used  Substance Use Topics   Alcohol use: Not Currently   Drug use:  Yes    Types: Marijuana    Comment: pt's husband reports she uses it to help with anxiety and depression    Home Medications Prior to Admission medications   Medication Sig Start Date End Date Taking? Authorizing Provider  acetaminophen (TYLENOL) 325 MG tablet Take 325 mg by mouth every 6 (six) hours as needed. 975 mg  per day for one week per patient.    [provider]  busPIRone (BUSPAR) 10 MG tablet Take 10 mg by mouth 3 (three) times daily.    [provider]  carbamazepine (TEGRETOL) 200 MG tablet Take 200 mg by mouth in the morning and at bedtime.    [provider]  clonazePAM (KLONOPIN) 0.5 MG tablet Take 0.5 mg by mouth in the morning, at noon, and at bedtime. 03/29/20   [provider]   diphenhydrAMINE (BENADRYL) 25 mg capsule Take 25 mg by mouth. Take as needed    [provider]  famotidine (PEPCID) 40 MG tablet Take 1 tablet (40 mg total) by mouth daily. 02/20/21   Carlan, Chelsea L, NP  Glucosamine-Chondroitin (GLUCOSAMINE CHONDR COMPLEX PO) Take 1 capsule by mouth daily. Patient not taking: Reported on 02/20/2021    [provider]  hydrOXYzine (ATARAX/VISTARIL) 50 MG tablet Take 1 tablet (50 mg total) by mouth at bedtime. Patient not taking: Reported on 02/20/2021 01/22/20   Valentina Shaggy, MD  lamoTRIgine (LAMICTAL) 100 MG tablet Take 100 mg by mouth 2 (two) times daily.    [provider]  levothyroxine (SYNTHROID) 25 MCG tablet Take 25 mcg by mouth daily before breakfast.    [provider]  mirtazapine (REMERON) 45 MG tablet Take 45 mg by mouth at bedtime.    [provider]  Multiple Vitamin (MULTIVITAMIN WITH MINERALS) TABS tablet Take 1 tablet by mouth daily. Patient not taking: Reported on 02/20/2021    [provider]  Omega-3 Fatty Acids (FISH OIL) 1000 MG CAPS Take 1,000 mg by mouth 3 (three) times daily. Patient not taking: Reported on 02/20/2021    [provider]  ondansetron (ZOFRAN-ODT) 4 MG disintegrating tablet Take 1 tablet (4 mg total) by mouth every 8 (eight) hours as needed for nausea or vomiting. 11/07/20   Montez Morita, Quillian Quince, MD  OVER THE COUNTER MEDICATION Probiotic 1 po Qd per patient. Patient not taking: Reported on 02/20/2021    [provider]  OVER THE COUNTER MEDICATION B 12 one po QD per patient . Patient not taking: No sig reported    [provider]  OVER THE COUNTER MEDICATION Vit D 2 1.2/50,000 IU once per week.    [provider]  pantoprazole (PROTONIX) 40 MG tablet Take 1 tablet (40 mg total) by mouth daily. Patient not taking: Reported on 02/20/2021 12/12/20   Harvel Quale, MD  propranolol (INDERAL) 20 MG tablet Take 20  mg by mouth 2 (two) times daily. 10mg  one tid 03/29/20   [provider]  risperiDONE (RISPERDAL) 0.5 MG tablet Take 1 tablet (0.5 mg total) by mouth at bedtime. 02/27/20   Money, Lowry Ram, FNP  rosuvastatin (CRESTOR) 20 MG tablet Take 20 mg by mouth daily.    [provider]  tiZANidine (ZANAFLEX) 4 MG tablet Take 4 mg by mouth 3 (three) times daily. Patient not taking: Reported on 02/20/2021 08/24/20   [provider]  Turmeric 500 MG CAPS Take 1,000 mg by mouth. Daily per patient. Patient not taking: Reported on 02/20/2021    [provider]  venlafaxine XR (EFFEXOR-XR) 150 MG 24 hr capsule Take 150 mg by mouth daily with breakfast.    [provider]    Allergies    Escitalopram, Seroquel [quetiapine fumarate], and Trazodone  Review of Systems   Review of Systems  Physical Exam Updated Vital Signs BP 133/77   Pulse 65   Temp 97.9 F (36.6 C)   Ht 5' 5.5" (1.664 m)   Wt 88 kg   SpO2 98%   BMI 31.79 kg/m   Physical Exam  ED Results / Procedures / Treatments   Labs (all labs ordered are listed, but only abnormal results are displayed) Labs Reviewed - No data to display  EKG None  Radiology No results found.  Procedures Procedures   Medications Ordered in ED Medications - No data to display  ED Course  I have reviewed the triage vital signs and the nursing notes.  Pertinent labs & imaging results that were available during my care of the patient were reviewed by me and considered in my medical decision making (see chart for details).    MDM Rules/Calculators/A&P                            Patient Vitals for the past 24 hrs:  BP Temp Pulse SpO2 Height Weight  03/28/21 0647 133/77 97.9 F (36.6 C) 65 98 % 5' 5.5" (1.664 m) 88 kg    8:03 AM Reevaluation with update and discussion. After initial assessment and treatment, an updated evaluation reveals she is now sitting up, alert and smiling.  She is calm.   Findings discussed with her and all questions were answered. Daleen Bo   Medical Decision Making:  This patient is presenting for evaluation of anxiety and panic attack, which does require a range of treatment options, and is a complaint that involves a moderate risk of morbidity and mortality. The differential diagnoses include clinic today, depression, medication noncompliance. I decided to review old records, and in summary elderly female presenting with anxiety panic attack after lowering diazepam dosing about a week ago.  No evidence for psychosis..  I obtained additional historical information from husband at bedside.    Critical Interventions-clinical evaluation, discussion with patient, review of prior records  After These Interventions, the Patient was reevaluated and was found stable for discharge.  She has been on decreased dose of clonazepam for 1 week and has increased panic symptoms during this time.  Patient states that this medication change was made by the nurse practitioner who discharged her from [facility], after a psychiatric evaluation and treatment on 03/04/2021.  I could not find documentation of this change in the medical record.  She has a short-term follow-up planned with her psychiatrist for next Monday.  There is no indication at this time for hospitalization.  I have instructed her to resume the clonazepam, 0.5 mg, twice daily, till she sees her psychiatrist .  She is agreeable with this plan.   CRITICAL CARE-no Performed by: Daleen Bo  Nursing Notes Reviewed/ Care Coordinated Applicable Imaging Reviewed Interpretation of Laboratory Data incorporated into ED treatment  The patient appears reasonably screened and/or stabilized for discharge and I doubt any other medical condition or other [facility] requiring further screening, evaluation, or treatment in the ED at this time prior to discharge.  Plan: Home Medications-resume clonazepam twice daily, continue  remainder; Home Treatments-rest, fluid; return here if the recommended treatment, does not improve the symptoms; Recommended  follow up-psychiatry follow-up as planned     Final Clinical Impression(s) / ED Diagnoses Final diagnoses:  Anxiety    Rx / DC Orders ED Discharge Orders     None        Daleen Bo, MD 03/28/21 1727

## 2021-04-02 ENCOUNTER — Encounter (HOSPITAL_COMMUNITY): Payer: Self-pay | Admitting: Emergency Medicine

## 2021-04-02 ENCOUNTER — Other Ambulatory Visit: Payer: Self-pay

## 2021-04-02 ENCOUNTER — Emergency Department (HOSPITAL_COMMUNITY)
Admission: EM | Admit: 2021-04-02 | Discharge: 2021-04-02 | Discharge status: Against Medical Advice | Payer: Medicare Other | Attending: Emergency Medicine | Admitting: Emergency Medicine

## 2021-04-02 DIAGNOSIS — Z79899 Other long term (current) drug therapy: Secondary | ICD-10-CM | POA: Insufficient documentation

## 2021-04-02 DIAGNOSIS — F411 Generalized anxiety disorder: Secondary | ICD-10-CM | POA: Insufficient documentation

## 2021-04-02 DIAGNOSIS — F32A Depression, unspecified: Secondary | ICD-10-CM

## 2021-04-02 DIAGNOSIS — F331 Major depressive disorder, recurrent, moderate: Secondary | ICD-10-CM | POA: Diagnosis not present

## 2021-04-02 DIAGNOSIS — Z046 Encounter for general psychiatric examination, requested by authority: Secondary | ICD-10-CM | POA: Diagnosis present

## 2021-04-02 DIAGNOSIS — R9431 Abnormal electrocardiogram [ECG] [EKG]: Secondary | ICD-10-CM | POA: Diagnosis not present

## 2021-04-02 DIAGNOSIS — R69 Illness, unspecified: Secondary | ICD-10-CM | POA: Diagnosis not present

## 2021-04-02 DIAGNOSIS — F419 Anxiety disorder, unspecified: Secondary | ICD-10-CM

## 2021-04-02 LAB — CBC
HCT: 41.3 % (ref 36.0–46.0)
Hemoglobin: 13.8 g/dL (ref 12.0–15.0)
MCH: 30.2 pg (ref 26.0–34.0)
MCHC: 33.4 g/dL (ref 30.0–36.0)
MCV: 90.4 fL (ref 80.0–100.0)
Platelets: 266 10*3/uL (ref 150–400)
RBC: 4.57 MIL/uL (ref 3.87–5.11)
RDW: 13.6 % (ref 11.5–15.5)
WBC: 7.5 10*3/uL (ref 4.0–10.5)
nRBC: 0 % (ref 0.0–0.2)

## 2021-04-02 LAB — RAPID URINE DRUG SCREEN, HOSP PERFORMED
Amphetamines: NOT DETECTED
Barbiturates: NOT DETECTED
Benzodiazepines: NOT DETECTED
Cocaine: NOT DETECTED
Opiates: NOT DETECTED
Tetrahydrocannabinol: POSITIVE — AB

## 2021-04-02 LAB — COMPREHENSIVE METABOLIC PANEL WITH GFR
ALT: 220 U/L — ABNORMAL HIGH (ref 0–44)
AST: 121 U/L — ABNORMAL HIGH (ref 15–41)
Albumin: 4 g/dL (ref 3.5–5.0)
Alkaline Phosphatase: 173 U/L — ABNORMAL HIGH (ref 38–126)
Anion gap: 9 (ref 5–15)
BUN: 8 mg/dL (ref 8–23)
CO2: 23 mmol/L (ref 22–32)
Calcium: 8.8 mg/dL — ABNORMAL LOW (ref 8.9–10.3)
Chloride: 107 mmol/L (ref 98–111)
Creatinine, Ser: 0.59 mg/dL (ref 0.44–1.00)
GFR, Estimated: >60 mL/min
Glucose, Bld: 110 mg/dL — ABNORMAL HIGH (ref 70–99)
Potassium: 4.1 mmol/L (ref 3.5–5.1)
Sodium: 139 mmol/L (ref 135–145)
Total Bilirubin: 0.2 mg/dL — ABNORMAL LOW (ref 0.3–1.2)
Total Protein: 7.5 g/dL (ref 6.5–8.1)

## 2021-04-02 LAB — ETHANOL: Alcohol, Ethyl (B): <10 mg/dL

## 2021-04-02 MED ORDER — HYDROXYZINE HCL 25 MG PO TABS: 50.0000 mg | ORAL_TABLET | Freq: Every day | ORAL | Status: DC

## 2021-04-02 MED ORDER — RISPERIDONE 1 MG PO TABS: 0.5000 mg | ORAL_TABLET | Freq: Every day | ORAL | Status: DC

## 2021-04-02 MED ORDER — BUSPIRONE HCL 5 MG PO TABS: 10.0000 mg | ORAL_TABLET | Freq: Three times a day (TID) | ORAL | Status: DC

## 2021-04-02 MED ORDER — LEVOTHYROXINE SODIUM 50 MCG PO TABS: 25.0000 ug | ORAL_TABLET | Freq: Every day | ORAL | Status: DC

## 2021-04-02 MED ORDER — LAMOTRIGINE 25 MG PO TABS: 100.0000 mg | ORAL_TABLET | Freq: Two times a day (BID) | ORAL | Status: DC

## 2021-04-02 MED ORDER — LORAZEPAM 1 MG PO TABS
1.0000 mg | ORAL_TABLET | Freq: Once | ORAL | Status: AC
  Administered 2021-04-02: 1 mg via ORAL
  Filled 2021-04-02: qty 1

## 2021-04-02 MED ORDER — CLONAZEPAM 0.5 MG PO TABS: 0.5000 mg | ORAL_TABLET | Freq: Every day | ORAL | Status: DC
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
## 2021-04-02 NOTE — ED Notes (Signed)
Pt is anxious, does not want door closed

## 2021-04-02 NOTE — Progress Notes (Signed)
Pt states to nurse during morning rounds that pt took all her home am medications at 0600 without the staff's knowledge.

## 2021-04-02 NOTE — ED Triage Notes (Signed)
Pt here with husband and states that she has bad OCD and was overwhelmed trying to place her meds in her pill box. Husband states pt has had her psychiatric meds changed recently. Pt states she cannot shower by herself and that she is "totally falling apart". Pt tearful during triage.

## 2021-04-02 NOTE — ED Provider Notes (Addendum)
[facility] Provider Note   CSN: [id number] Arrival date & time: 04/02/21  6314     History Chief Complaint  Patient presents with   Medical Clearance    Dawn Kelley is a 60 y.o. female.  Patient presents to the emergency department for psychiatric evaluation.  Patient has a significant psychiatric history.  She reports that she has had recent medication adjustments and this has caused her to worsen.  She has severe OCD and anxiety.  She has become crippled by her anxiety over her new medications.  Patient tearful, expressing feelings of complete helplessness.  She is accompanied by her husband who reports that she is unable to perform her ADLs.  Both the patient and her husband think she needs to be hospitalized.       Past Medical History:  Diagnosis Date   Anxiety    Bipolar 1 disorder ([place])    Depression    OCD (obsessive compulsive disorder)    PTSD (post-traumatic stress disorder)    S/P ECT (electroconvulsive therapy)     Patient Active Problem List   Diagnosis Date Noted   Gastroesophageal reflux disease without esophagitis 02/20/2021   Moderate episode of recurrent major depressive disorder (HCC)    Generalized anxiety disorder    Irritable bowel syndrome with both constipation and diarrhea 01/27/2020   Abdominal pain 01/27/2020   Nausea with vomiting 01/22/2020   Pruritus 01/22/2020   Chronic urticaria 01/22/2020    Past Surgical History:  Procedure Laterality Date   BIOPSY  02/03/2020   Procedure: BIOPSY;  Surgeon: Harvel Quale, MD;  [facility]: [facility];  Service: Gastroenterology;;   COLONOSCOPY WITH PROPOFOL N/A 02/03/2020   [name]: 36mm polyp in sigmoid colon  as well as hemorrhoids, histology consistent with tubular adenoma   ESOPHAGOGASTRODUODENOSCOPY (EGD) WITH PROPOFOL N/A 02/03/2020   castaneda: small gastric polyps, sessile, and fundic gland, small bowel bx normal   INDUCED ABORTION N/A 1980    POLYPECTOMY  02/03/2020   Procedure: POLYPECTOMY;  Surgeon: Harvel Quale, MD;  Location: [facility];  Service: Gastroenterology;;   VEIN SURGERY       OB History     Gravida  4   Para  3   Term  3   Preterm  0   AB  1   Living  3      SAB      IAB      Ectopic      Multiple      Live Births  3           Family History  Problem Relation Age of Onset   Depression Mother    Pneumonia Mother     Social History   Tobacco Use   Smoking status: Never   Smokeless tobacco: Never  Vaping Use   Vaping Use: Never used  Substance Use Topics   Alcohol use: Not Currently   Drug use: Yes    Types: Marijuana    Comment: pt's husband reports she uses it to help with anxiety and depression    Home Medications Prior to Admission medications   Medication Sig Start Date End Date Taking? Authorizing Provider  acetaminophen (TYLENOL) 325 MG tablet Take 325 mg by mouth every 6 (six) hours as needed. 975 mg  per day for one week per patient.    [provider]  busPIRone (BUSPAR) 10 MG tablet Take 10 mg by mouth 3 (three) times daily.    [provider]  carbamazepine (TEGRETOL) 200 MG tablet Take 200 mg by mouth in the morning and at bedtime.    [provider]  clonazePAM (KLONOPIN) 0.5 MG tablet Take 0.5 mg by mouth in the morning, at noon, and at bedtime. 03/29/20   [provider]  diphenhydrAMINE (BENADRYL) 25 mg capsule Take 25 mg by mouth. Take as needed    [provider]  famotidine (PEPCID) 40 MG tablet Take 1 tablet (40 mg total) by mouth daily. 02/20/21   Carlan, Chelsea L, NP  Glucosamine-Chondroitin (GLUCOSAMINE CHONDR COMPLEX PO) Take 1 capsule by mouth daily. Patient not taking: Reported on 02/20/2021    [provider]  hydrOXYzine (ATARAX/VISTARIL) 50 MG tablet Take 1 tablet (50 mg total) by mouth at bedtime. Patient not taking: Reported on 02/20/2021 01/22/20   Valentina Shaggy, MD   lamoTRIgine (LAMICTAL) 100 MG tablet Take 100 mg by mouth 2 (two) times daily.    [provider]  levothyroxine (SYNTHROID) 25 MCG tablet Take 25 mcg by mouth daily before breakfast.    [provider]  mirtazapine (REMERON) 45 MG tablet Take 45 mg by mouth at bedtime.    [provider]  Multiple Vitamin (MULTIVITAMIN WITH MINERALS) TABS tablet Take 1 tablet by mouth daily. Patient not taking: Reported on 02/20/2021    [provider]  Omega-3 Fatty Acids (FISH OIL) 1000 MG CAPS Take 1,000 mg by mouth 3 (three) times daily. Patient not taking: Reported on 02/20/2021    [provider]  ondansetron (ZOFRAN-ODT) 4 MG disintegrating tablet Take 1 tablet (4 mg total) by mouth every 8 (eight) hours as needed for nausea or vomiting. 11/07/20   Montez Morita, Quillian Quince, MD  OVER THE COUNTER MEDICATION Probiotic 1 po Qd per patient. Patient not taking: Reported on 02/20/2021    [provider]  OVER THE COUNTER MEDICATION B 12 one po QD per patient . Patient not taking: No sig reported    [provider]  OVER THE COUNTER MEDICATION Vit D 2 1.2/50,000 IU once per week.    [provider]  pantoprazole (PROTONIX) 40 MG tablet Take 1 tablet (40 mg total) by mouth daily. Patient not taking: Reported on 02/20/2021 12/12/20   Harvel Quale, MD  propranolol (INDERAL) 20 MG tablet Take 20 mg by mouth 2 (two) times daily. 10mg  one tid 03/29/20   [provider]  risperiDONE (RISPERDAL) 0.5 MG tablet Take 1 tablet (0.5 mg total) by mouth at bedtime. 02/27/20   Money, Lowry Ram, FNP  rosuvastatin (CRESTOR) 20 MG tablet Take 20 mg by mouth daily.    [provider]  tiZANidine (ZANAFLEX) 4 MG tablet Take 4 mg by mouth 3 (three) times daily. Patient not taking: Reported on 02/20/2021 08/24/20   [provider]  Turmeric 500 MG CAPS Take 1,000 mg by mouth. Daily per patient. Patient not taking: Reported on  02/20/2021    [provider]  venlafaxine XR (EFFEXOR-XR) 150 MG 24 hr capsule Take 150 mg by mouth daily with breakfast.    [provider]    Allergies    Escitalopram, Seroquel [quetiapine fumarate], and Trazodone  Review of Systems   Review of Systems  Psychiatric/Behavioral:  Positive for dysphoric mood. The patient is nervous/anxious.   All other systems reviewed and are negative.  Physical Exam Updated Vital Signs There were no vitals taken for this visit.  Physical Exam Vitals and nursing note reviewed.  Constitutional:  General: She is not in acute distress.    Appearance: Normal appearance. She is well-developed.  HENT:     Head: Normocephalic and atraumatic.     Right Ear: Hearing normal.     Left Ear: Hearing normal.     Nose: Nose normal.  Eyes:     Conjunctiva/sclera: Conjunctivae normal.     Pupils: Pupils are equal, round, and reactive to light.  Cardiovascular:     Rate and Rhythm: Regular rhythm.     Heart sounds: S1 normal and S2 normal. No murmur heard.   No friction rub. No gallop.  Pulmonary:     Effort: Pulmonary effort is normal. No respiratory distress.     Breath sounds: Normal breath sounds.  Chest:     Chest wall: No tenderness.  Abdominal:     General: Bowel sounds are normal.     Palpations: Abdomen is soft.     Tenderness: There is no abdominal tenderness. There is no guarding or rebound. Negative signs include Murphy's sign and McBurney's sign.     Hernia: No hernia is present.  Musculoskeletal:        General: Normal range of motion.     Cervical back: Normal range of motion and neck supple.  Skin:    General: Skin is warm and dry.     Findings: No rash.  Neurological:     Mental Status: She is alert and oriented to person, place, and time.     GCS: GCS eye subscore is 4. GCS verbal subscore is 5. GCS motor subscore is 6.     Cranial Nerves: No cranial nerve deficit.     Sensory: No sensory deficit.      Coordination: Coordination normal.  Psychiatric:        Mood and Affect: Mood is anxious. Affect is tearful.        Speech: Speech normal.        Behavior: Behavior is slowed and withdrawn.        Thought Content: Thought content normal.    ED Results / Procedures / Treatments   Labs (all labs ordered are listed, but only abnormal results are displayed) Labs Reviewed - No data to display  EKG EKG Interpretation  Date/Time:  Sunday April 02 2021 06:48:35 EST Ventricular Rate:  66 PR Interval:  132 QRS Duration: 92 QT Interval:  394 QTC Calculation: 413 R Axis:   13 Text Interpretation: Sinus rhythm Low voltage, precordial leads Borderline T abnormalities, anterior leads Confirmed by Orpah Greek [phone]) on 04/02/2021 6:52:44 AM  Radiology No results found.  Procedures Procedures   Medications Ordered in ED Medications - No data to display  ED Course  I have reviewed the triage vital signs and the nursing notes.  Pertinent labs & imaging results that were available during my care of the patient were reviewed by me and considered in my medical decision making (see chart for details).    MDM Rules/Calculators/A&P                           Patient presents with worsening anxiety and depression.  Reviewing her records reveal she has an extensive psychiatric history with failed treatment including ECT therapy.  She did in fact have recent medication adjustment and reports worsening symptoms.  She is not homicidal or suicidal but is extremely distraught, tearful and uncontrollably anxious.  Will require psychiatric evaluation to help determine further management.  Final Clinical  Impression(s) / ED Diagnoses Final diagnoses:  Depression, unspecified depression type  Anxiety    Rx / DC Orders ED Discharge Orders     None        Santosha Jividen, Gwenyth Allegra, MD 04/02/21 [id number]    Orpah Greek, MD 04/02/21 [phone]

## 2021-04-02 NOTE — ED Provider Notes (Signed)
Patient was seen by previous physician.  Briefly this is a 60 year old female who presented for psychiatric evaluation.  Reported that she had recent medication adjustments that had caused her anxiety and depression to worsen.  She had no reported SI/HI to the previous physician.  Did not appear to be a danger to herself.  She was pending pharmacy med rec evaluation and TTS.  Pharmacy evaluated meds, home meds ordered.  TTS did evaluate the patient, following this the patient was requesting to leave and tried to elope.  TTS did not appreciate any acute factors in regards to SI/HI either.  Did not have any acute recommendations at the time as they were pending review of the patient information with the NP.  When I spoke with the patient she had clear speech, steady gait, no distress.  Continues to deny any SI/HI.  Declines to wait any further for psychiatric recommendations.  I plan to reach out to TTS to get any further recommendations/support for the patient however she decided to walk out of the department.  Did not have any grounds for IVC, did not appear to be a danger to herself or others.  She was calm and otherwise cooperative.   Lorelle Gibbs, DO 04/02/21 1031

## 2021-04-04 DIAGNOSIS — Z8673 Personal history of transient ischemic attack (TIA), and cerebral infarction without residual deficits: Secondary | ICD-10-CM | POA: Diagnosis not present

## 2021-04-04 DIAGNOSIS — Z888 Allergy status to other drugs, medicaments and biological substances status: Secondary | ICD-10-CM | POA: Diagnosis not present

## 2021-04-04 DIAGNOSIS — Z91040 Latex allergy status: Secondary | ICD-10-CM | POA: Diagnosis not present

## 2021-04-04 DIAGNOSIS — Z79899 Other long term (current) drug therapy: Secondary | ICD-10-CM | POA: Diagnosis not present

## 2021-04-09 DIAGNOSIS — Z79899 Other long term (current) drug therapy: Secondary | ICD-10-CM | POA: Diagnosis not present

## 2021-04-09 DIAGNOSIS — Z888 Allergy status to other drugs, medicaments and biological substances status: Secondary | ICD-10-CM | POA: Diagnosis not present

## 2021-04-09 DIAGNOSIS — R451 Restlessness and agitation: Secondary | ICD-10-CM | POA: Diagnosis not present

## 2021-04-09 DIAGNOSIS — Z885 Allergy status to narcotic agent status: Secondary | ICD-10-CM | POA: Diagnosis not present

## 2021-04-15 DIAGNOSIS — Z20822 Contact with and (suspected) exposure to covid-19: Secondary | ICD-10-CM | POA: Diagnosis not present

## 2021-04-15 DIAGNOSIS — R451 Restlessness and agitation: Secondary | ICD-10-CM | POA: Diagnosis not present

## 2021-04-15 DIAGNOSIS — Z87891 Personal history of nicotine dependence: Secondary | ICD-10-CM | POA: Diagnosis not present

## 2021-04-15 DIAGNOSIS — R9431 Abnormal electrocardiogram [ECG] [EKG]: Secondary | ICD-10-CM | POA: Diagnosis not present

## 2021-04-15 DIAGNOSIS — T50905A Adverse effect of unspecified drugs, medicaments and biological substances, initial encounter: Secondary | ICD-10-CM | POA: Diagnosis not present

## 2021-04-15 DIAGNOSIS — T424X5A Adverse effect of benzodiazepines, initial encounter: Secondary | ICD-10-CM | POA: Diagnosis not present

## 2021-04-15 DIAGNOSIS — Z79899 Other long term (current) drug therapy: Secondary | ICD-10-CM | POA: Diagnosis not present

## 2021-04-15 DIAGNOSIS — F32A Depression, unspecified: Secondary | ICD-10-CM | POA: Diagnosis not present

## 2021-04-15 DIAGNOSIS — Z888 Allergy status to other drugs, medicaments and biological substances status: Secondary | ICD-10-CM | POA: Diagnosis not present

## 2021-04-19 DIAGNOSIS — R9431 Abnormal electrocardiogram [ECG] [EKG]: Secondary | ICD-10-CM | POA: Diagnosis not present

## 2021-04-20 ENCOUNTER — Other Ambulatory Visit: Payer: Self-pay

## 2021-04-20 ENCOUNTER — Emergency Department (HOSPITAL_COMMUNITY)
Admission: EM | Admit: 2021-04-20 | Discharge: 2021-04-20 | Discharge status: Against Medical Advice | Payer: Medicare Other | Attending: Emergency Medicine | Admitting: Emergency Medicine

## 2021-04-20 ENCOUNTER — Encounter (HOSPITAL_COMMUNITY): Payer: Self-pay

## 2021-04-20 DIAGNOSIS — F419 Anxiety disorder, unspecified: Secondary | ICD-10-CM | POA: Diagnosis not present

## 2021-04-20 DIAGNOSIS — Z79899 Other long term (current) drug therapy: Secondary | ICD-10-CM | POA: Diagnosis not present

## 2021-04-20 DIAGNOSIS — Z743 Need for continuous supervision: Secondary | ICD-10-CM | POA: Diagnosis not present

## 2021-04-20 LAB — CBC WITH DIFFERENTIAL/PLATELET
Abs Immature Granulocytes: 0.02 10*3/uL (ref 0.00–0.07)
Basophils Absolute: 0 10*3/uL (ref 0.0–0.1)
Basophils Relative: 1 %
Eosinophils Absolute: 0.1 10*3/uL (ref 0.0–0.5)
Eosinophils Relative: 1 %
HCT: 43 % (ref 36.0–46.0)
Hemoglobin: 14.4 g/dL (ref 12.0–15.0)
Immature Granulocytes: 0 %
Lymphocytes Relative: 27 %
Lymphs Abs: 2 10*3/uL (ref 0.7–4.0)
MCH: 30.6 pg (ref 26.0–34.0)
MCHC: 33.5 g/dL (ref 30.0–36.0)
MCV: 91.5 fL (ref 80.0–100.0)
Monocytes Absolute: 0.5 10*3/uL (ref 0.1–1.0)
Monocytes Relative: 6 %
Neutro Abs: 5 10*3/uL (ref 1.7–7.7)
Neutrophils Relative %: 65 %
Platelets: 301 10*3/uL (ref 150–400)
RBC: 4.7 MIL/uL (ref 3.87–5.11)
RDW: 13.2 % (ref 11.5–15.5)
WBC: 7.7 10*3/uL (ref 4.0–10.5)
nRBC: 0 % (ref 0.0–0.2)

## 2021-04-20 LAB — COMPREHENSIVE METABOLIC PANEL
ALT: 62 U/L — ABNORMAL HIGH (ref 0–44)
AST: 30 U/L (ref 15–41)
Albumin: 4.2 g/dL (ref 3.5–5.0)
Alkaline Phosphatase: 230 U/L — ABNORMAL HIGH (ref 38–126)
Anion gap: 7 (ref 5–15)
BUN: 13 mg/dL (ref 8–23)
CO2: 25 mmol/L (ref 22–32)
Calcium: 9.1 mg/dL (ref 8.9–10.3)
Chloride: 105 mmol/L (ref 98–111)
Creatinine, Ser: 0.66 mg/dL (ref 0.44–1.00)
GFR, Estimated: >60 mL/min (ref >60)
Glucose, Bld: 110 mg/dL — ABNORMAL HIGH (ref 70–99)
Potassium: 4.2 mmol/L (ref 3.5–5.1)
Sodium: 137 mmol/L (ref 135–145)
Total Bilirubin: 0.3 mg/dL (ref 0.3–1.2)
Total Protein: 7.7 g/dL (ref 6.5–8.1)

## 2021-04-20 LAB — RAPID URINE DRUG SCREEN, HOSP PERFORMED
Amphetamines: NOT DETECTED
Barbiturates: NOT DETECTED
Benzodiazepines: POSITIVE — AB
Cocaine: NOT DETECTED
Opiates: NOT DETECTED
Tetrahydrocannabinol: POSITIVE — AB

## 2021-04-20 LAB — ETHANOL: Alcohol, Ethyl (B): <10 mg/dL (ref <10)

## 2021-04-20 MED ORDER — VENLAFAXINE HCL ER 37.5 MG PO CP24: 150.0000 mg | ORAL_CAPSULE | Freq: Every day | ORAL | Status: DC

## 2021-04-20 MED ORDER — CLONAZEPAM 0.5 MG PO TABS: 0.5000 mg | ORAL_TABLET | Freq: Two times a day (BID) | ORAL | Status: DC

## 2021-04-20 MED ORDER — MIRTAZAPINE 15 MG PO TABS: 45.0000 mg | ORAL_TABLET | Freq: Every day | ORAL | Status: DC

## 2021-04-20 MED ORDER — RISPERIDONE 1 MG PO TABS: 0.5000 mg | ORAL_TABLET | Freq: Every day | ORAL | Status: DC

## 2021-04-20 MED ORDER — ONDANSETRON HCL 4 MG PO TABS: 4.0000 mg | ORAL_TABLET | Freq: Three times a day (TID) | ORAL | Status: DC | PRN

## 2021-04-20 MED ORDER — PROPRANOLOL HCL 10 MG PO TABS: 20.0000 mg | ORAL_TABLET | Freq: Two times a day (BID) | ORAL | Status: DC

## 2021-04-20 MED ORDER — ROSUVASTATIN CALCIUM 20 MG PO TABS: 20.0000 mg | ORAL_TABLET | Freq: Every day | ORAL | Status: DC

## 2021-04-20 MED ORDER — ACETAMINOPHEN 325 MG PO TABS: 650.0000 mg | ORAL_TABLET | ORAL | Status: DC | PRN

## 2021-04-20 MED ORDER — LAMOTRIGINE 25 MG PO TABS: 100.0000 mg | ORAL_TABLET | Freq: Two times a day (BID) | ORAL | Status: DC

## 2021-04-20 MED ORDER — LEVOTHYROXINE SODIUM 50 MCG PO TABS: 25.0000 ug | ORAL_TABLET | Freq: Every day | ORAL | Status: DC

## 2021-04-20 MED ORDER — ALUM & MAG HYDROXIDE-SIMETH 200-200-20 MG/5ML PO SUSP: 30.0000 mL | Freq: Four times a day (QID) | ORAL | Status: DC | PRN

## 2021-04-20 MED ORDER — CARBAMAZEPINE 200 MG PO TABS: 200.0000 mg | ORAL_TABLET | Freq: Three times a day (TID) | ORAL | Status: DC

## 2021-04-20 MED ORDER — LORAZEPAM 1 MG PO TABS
1.0000 mg | ORAL_TABLET | Freq: Once | ORAL | Status: AC
  Administered 2021-04-20: 1 mg via ORAL
  Filled 2021-04-20: qty 1

## 2021-04-20 MED ORDER — FAMOTIDINE 20 MG PO TABS: 40.0000 mg | ORAL_TABLET | Freq: Every day | ORAL | Status: DC

## 2021-04-20 MED ORDER — BUSPIRONE HCL 5 MG PO TABS: 10.0000 mg | ORAL_TABLET | Freq: Three times a day (TID) | ORAL | Status: DC

## 2021-04-20 NOTE — ED Triage Notes (Signed)
Pt presents to ED via [facility] from home with c/o anxiety. Pt was seen at [facility] ED last night for the same and discharged. Pt reports seeing her psychiatrist 3 days ago. Pt denies SI/HI

## 2021-04-20 NOTE — ED Provider Notes (Addendum)
[facility] EMERGENCY DEPARTMENT Provider Note   CSN: [id number] Arrival date & time: 04/20/21  0615     History Chief Complaint  Patient presents with   Anxiety    Dawn Kelley is a 60 y.o. female.  The history is provided by the patient.  Anxiety She has history of bipolar disorder, anxiety and comes in with worsening of her anxiety.  She states that she has had problems with anxiety since she was in her 54s when she had nervous breakdown.  She had been on clonazepam since then, but over the last 3 months has noted that symptoms have been getting worse and clonazepam is no longer helping.  She denies homicidal or suicidal ideation.  She denies hallucinations.  She does admit to marijuana use but denies ethanol or tobacco use and denies other illicit drug use.  Marijuana does give her some slight relief.   Past Medical History:  Diagnosis Date   Anxiety    Bipolar 1 disorder ([place])    Depression    OCD (obsessive compulsive disorder)    PTSD (post-traumatic stress disorder)    S/P ECT (electroconvulsive therapy)     Patient Active Problem List   Diagnosis Date Noted   Gastroesophageal reflux disease without esophagitis 02/20/2021   Moderate episode of recurrent major depressive disorder (HCC)    Generalized anxiety disorder    Irritable bowel syndrome with both constipation and diarrhea 01/27/2020   Abdominal pain 01/27/2020   Nausea with vomiting 01/22/2020   Pruritus 01/22/2020   Chronic urticaria 01/22/2020    Past Surgical History:  Procedure Laterality Date   BIOPSY  02/03/2020   Procedure: BIOPSY;  Surgeon: Harvel Quale, MD;  [facility];  Service: Gastroenterology;;   COLONOSCOPY WITH PROPOFOL N/A 02/03/2020   [name]: 47mm polyp in sigmoid colon  as well as hemorrhoids, histology consistent with tubular adenoma   ESOPHAGOGASTRODUODENOSCOPY (EGD) WITH PROPOFOL N/A 02/03/2020   castaneda: small gastric polyps, sessile, and fundic  gland, small bowel bx normal   INDUCED ABORTION N/A 1980   POLYPECTOMY  02/03/2020   Procedure: POLYPECTOMY;  Surgeon: Harvel Quale, MD;  [facility];  Service: Gastroenterology;;   VEIN SURGERY       OB History     Gravida  4   Para  3   Term  3   Preterm  0   AB  1   Living  3      SAB      IAB      Ectopic      Multiple      Live Births  3           Family History  Problem Relation Age of Onset   Depression Mother    Pneumonia Mother     Social History   Tobacco Use   Smoking status: Never   Smokeless tobacco: Never  Vaping Use   Vaping Use: Never used  Substance Use Topics   Alcohol use: Not Currently   Drug use: Yes    Types: Marijuana    Comment: pt's husband reports she uses it to help with anxiety and depression    Home Medications Prior to Admission medications   Medication Sig Start Date End Date Taking? Authorizing Provider  acetaminophen (TYLENOL) 325 MG tablet Take 325 mg by mouth every 6 (six) hours as needed. 975 mg  per day for one week per patient.    [provider]  busPIRone (BUSPAR)  10 MG tablet Take 10 mg by mouth 3 (three) times daily.    [provider]  carbamazepine (TEGRETOL) 200 MG tablet Take 200 mg by mouth in the morning and at bedtime.    [provider]  clonazePAM (KLONOPIN) 0.5 MG tablet Take 0.5 mg by mouth in the morning, at noon, and at bedtime. 03/29/20   [provider]  diphenhydrAMINE (BENADRYL) 25 mg capsule Take 25 mg by mouth. Take as needed    [provider]  famotidine (PEPCID) 40 MG tablet Take 1 tablet (40 mg total) by mouth daily. 02/20/21   Carlan, Chelsea L, NP  Glucosamine-Chondroitin (GLUCOSAMINE CHONDR COMPLEX PO) Take 1 capsule by mouth daily. Patient not taking: Reported on 02/20/2021    [provider]  hydrOXYzine (ATARAX/VISTARIL) 50 MG tablet Take 1 tablet (50 mg total) by mouth at bedtime. Patient not  taking: Reported on 02/20/2021 01/22/20   Valentina Shaggy, MD  lamoTRIgine (LAMICTAL) 100 MG tablet Take 100 mg by mouth 2 (two) times daily.    [provider]  levothyroxine (SYNTHROID) 25 MCG tablet Take 25 mcg by mouth daily before breakfast.    [provider]  mirtazapine (REMERON) 45 MG tablet Take 45 mg by mouth at bedtime.    [provider]  Multiple Vitamin (MULTIVITAMIN WITH MINERALS) TABS tablet Take 1 tablet by mouth daily. Patient not taking: Reported on 02/20/2021    [provider]  Omega-3 Fatty Acids (FISH OIL) 1000 MG CAPS Take 1,000 mg by mouth 3 (three) times daily. Patient not taking: Reported on 02/20/2021    [provider]  ondansetron (ZOFRAN-ODT) 4 MG disintegrating tablet Take 1 tablet (4 mg total) by mouth every 8 (eight) hours as needed for nausea or vomiting. 11/07/20   Montez Morita, Quillian Quince, MD  OVER THE COUNTER MEDICATION Probiotic 1 po Qd per patient. Patient not taking: Reported on 02/20/2021    [provider]  OVER THE COUNTER MEDICATION B 12 one po QD per patient . Patient not taking: No sig reported    [provider]  OVER THE COUNTER MEDICATION Vit D 2 1.2/50,000 IU once per week.    [provider]  pantoprazole (PROTONIX) 40 MG tablet Take 1 tablet (40 mg total) by mouth daily. Patient not taking: Reported on 02/20/2021 12/12/20   Harvel Quale, MD  propranolol (INDERAL) 20 MG tablet Take 20 mg by mouth 2 (two) times daily. 10mg  one tid 03/29/20   [provider]  risperiDONE (RISPERDAL) 0.5 MG tablet Take 1 tablet (0.5 mg total) by mouth at bedtime. 02/27/20   Money, Lowry Ram, FNP  rosuvastatin (CRESTOR) 20 MG tablet Take 20 mg by mouth daily.    [provider]  tiZANidine (ZANAFLEX) 4 MG tablet Take 4 mg by mouth 3 (three) times daily. Patient not taking: Reported on 02/20/2021 08/24/20   [provider]  Turmeric 500 MG CAPS Take  1,000 mg by mouth. Daily per patient. Patient not taking: Reported on 02/20/2021    [provider]  venlafaxine XR (EFFEXOR-XR) 150 MG 24 hr capsule Take 150 mg by mouth daily with breakfast.    [provider]    Allergies    Escitalopram, Seroquel [quetiapine fumarate], and Trazodone  Review of Systems   Review of Systems  All other systems reviewed and are negative.  Physical Exam Updated Vital Signs BP 110/69 (BP Location: Left Arm)   Pulse 69   Temp 98.8 F (37.1 C)  Resp 18   Ht 5\' 5"  (1.651 m)   Wt 88 kg   SpO2 98%   BMI 32.28 kg/m   Physical Exam Vitals and nursing note reviewed.  60 year old female, very anxious and restless, but in no acute distress. Vital signs are normal. Oxygen saturation is 98%, which is normal. Head is normocephalic and atraumatic. PERRLA, EOMI. Oropharynx is clear. Neck is nontender and supple without adenopathy or JVD. Back is nontender and there is no CVA tenderness. Lungs are clear without rales, wheezes, or rhonchi. Chest is nontender. Heart has regular rate and rhythm without murmur. Abdomen is soft, flat, nontender. Extremities have no cyanosis or edema, full range of motion is present. Skin is warm and dry without rash. Neurologic: Mental status is normal, cranial nerves are intact, there are no motor or sensory deficits.  Resting tremor present without cogwheel rigidity.  ED Results / Procedures / Treatments   Labs (all labs ordered are listed, but only abnormal results are displayed) Labs Reviewed - No data to display  Procedures Procedures   Medications Ordered in ED Medications  alum & mag hydroxide-simeth (MAALOX/MYLANTA) 200-200-20 MG/5ML suspension 30 mL (has no administration in time range)  ondansetron (ZOFRAN) tablet 4 mg (has no administration in time range)  acetaminophen (TYLENOL) tablet 650 mg (has no administration in time range)  busPIRone (BUSPAR) tablet 10 mg (has no administration in  time range)  carbamazepine (TEGRETOL) tablet 200 mg (has no administration in time range)  clonazePAM (KLONOPIN) tablet 0.5 mg (has no administration in time range)  famotidine (PEPCID) tablet 40 mg (has no administration in time range)  lamoTRIgine (LAMICTAL) tablet 100 mg (has no administration in time range)  levothyroxine (SYNTHROID) tablet 25 mcg (has no administration in time range)  mirtazapine (REMERON) tablet 45 mg (has no administration in time range)  risperiDONE (RISPERDAL) tablet 0.5 mg (has no administration in time range)  propranolol (INDERAL) tablet 20 mg (has no administration in time range)  rosuvastatin (CRESTOR) tablet 20 mg (has no administration in time range)  venlafaxine XR (EFFEXOR-XR) 24 hr capsule 150 mg (has no administration in time range)  LORazepam (ATIVAN) tablet 1 mg (has no administration in time range)    ED Course  I have reviewed the triage vital signs and the nursing notes.  Pertinent lab results that were available during my care of the patient were reviewed by me and considered in my medical decision making (see chart for details).   MDM Rules/Calculators/A&P                         Exacerbation of anxiety disorder.  This is severe and significantly impacting her ability to perform activities of daily living.  Old records are reviewed confirming ED visit yesterday, for same complaint.  7 total ED visits in the last month for the same complaint.  She is not homicidal or suicidal, does not meet criteria for involuntary commitment.  However, I feel her symptoms are severe enough to warrant evaluation for possible psychiatric admission.  Patient is asking whether she should leave and go to [facility].  I advised her that no matter what hospital she goes to, she will start over with the ED evaluation and I have recommended that she stay here and obtain TTS consultation.  Final Clinical Impression(s) / ED Diagnoses Final diagnoses:  Anxiety    Rx /  DC Orders ED Discharge Orders     None  Delora Fuel, MD 18/20/99 [phone]  Patient states she does not want to stay.  She is not homicidal or suicidal, does not meet criteria for involuntary commitment.  She is allowed to leave [place].  She is advised to go to [facility] behavioral urgent care center for evaluation of her mental health issues.    Delora Fuel, MD 34/06/84 [phone]

## 2021-04-20 NOTE — Discharge Instructions (Addendum)
Go to [facility] [address] in [place]

## 2021-04-22 ENCOUNTER — Ambulatory Visit (HOSPITAL_COMMUNITY)
Admission: EM | Admit: 2021-04-22 | Discharge: 2021-04-22 | Disposition: A | Discharge status: Home / Self Care | Payer: Medicare Other

## 2021-04-22 DIAGNOSIS — F411 Generalized anxiety disorder: Secondary | ICD-10-CM

## 2021-04-22 NOTE — Discharge Instructions (Addendum)

## 2021-04-22 NOTE — ED Provider Notes (Signed)
Behavioral Health Urgent Care Medical Screening Exam  Patient Name: Dawn Kelley MRN: [id number] Date of Evaluation: 04/22/21 Chief Complaint:   Diagnosis:  Final diagnoses:  Generalized anxiety disorder    History of Present illness: Dawn Kelley is a 60 y.o. female. Patient presents voluntarily to [facility] behavioral health for walk-in assessment.  Patient is accompanied by her husband, Dawn Kelley.  Patient's husband remains present during assessment as patient prefers.  [name] states "I had a breakdown 3 years ago, since then I have been extremely agitated and anxious."  She reports that for 3 years she has experienced symptoms associated with anxiety and depression including decreased sleep,decreased appetite, increased anxiety, decreased ability to complete activities of daily living including bathing and preparing meals.  She is cared for by her husband.  Recent stressors include "the doctor in the emergency room does not think it is a good idea to take me off of my clonazepam, I am 60 years old and I have been on it since my 81s."  [name] reports she is followed by outpatient psychiatry, Dr. Leanord Hawking for approximately 5 months.  She states "Dr. Evette Doffing cut me down to one half or 1 clonazepam for the next 2 days and I feel like I cannot  cope." [name] shares she last spoke with outpatient prescriber, Dr Sanjuana Letters, 4 days ago.   Patient is assessed face-to-face by nurse practitioner.  She is seated in assessment area, no acute distress.  She is alert and oriented, pleasant and cooperative during assessment.   She presents with anxious mood, tearful affect. She denies suicidal and homicidal ideations.  She contracts verbally for safety with this Probation officer.  Dawn Kelley reports she has been diagnosed with anxiety, depression, OCD and PTSD.  She is followed by outpatient psychiatry, Dr. Sanjuana Letters, who has recently began to taper her clonazepam, she is uncertain as to specific reasons.  She  is compliant and takes all psychotropic medications as prescribed.  She is unable to recall medications prescribed, aside from clonazepam, at this time.  Patient's husband does assist with medication administration.  She is not currently followed by outpatient counseling.  She reports she has been treated by individual counseling in the past, last counselor approximately 15 years ago.  She did feel that outpatient individual counseling was therapeutic in the past.  She has history of inpatient psychiatric admissions, most recent admission in 01/2021 at [facility].  Dawn Kelley endorses 1 previous suicide attempt at age 16 years old.  She denies any history of nonsuicidal, self-harm behavior.  She has normal speech and behavior.  She denies both auditory and visual hallucinations.  Patient is able to converse coherently with goal-directed thoughts and no distractibility or preoccupation.  She denies paranoia.  Objectively there is no evidence of psychosis/mania or delusional thinking.  Dawn Kelley resides in [place] with her husband.  She denies access to weapons.  She is not currently employed outside the home.  Patient endorses decreased appetite, no weight loss appreciated by patient.  She endorses alcohol and marijuana use.  She reports using both alcohol and marijuana chronically, approximately 3 times per week.  Last use of alcohol and marijuana several days ago.   Patient offered support and encouragement. Patient and husband agree with plan to follow-up with established outpatient psychiatry provider.  Patient and husband also agree with plan to follow-up with outpatient therapy. Patient and husband counseled regarding use of benzodiazepine medications simultaneously with alcohol.  Discussed potential outcomes, up to and including death.  Patient and  husband verbalize understanding.   Psychiatric Specialty Exam  Presentation  General Appearance:Appropriate for Environment; Casual  Eye  Contact:Fair  Speech:Clear and Coherent  Speech Volume:Normal  Handedness:Right   Mood and Affect  Mood:Anxious  Affect:Appropriate; Congruent   Thought Process  Thought Processes:Coherent; Goal Directed; Linear  Descriptions of Associations:Intact  Orientation:Full (Time, Place and Person)  Thought Content:Logical; WDL  Diagnosis of Schizophrenia or Schizoaffective disorder in past: No   Hallucinations:None  Ideas of Reference:None  Suicidal Thoughts:No  Homicidal Thoughts:No   Sensorium  Memory:Immediate Good; Recent Fair; Remote Fair  Judgment:Fair  Insight:Fair   Executive Functions  Concentration:Good  Attention Span:Good  [place]  Language:Good   Psychomotor Activity  Psychomotor Activity:Tremor   Assets  Assets:Communication Skills; Desire for Improvement; Financial Resources/Insurance; Housing; Intimacy; Physical Health; Resilience; Social Support   Sleep  Sleep:Poor  Number of hours: No data recorded  No data recorded  Physical Exam: Physical Exam Vitals and nursing note reviewed.  Constitutional:      Appearance: Normal appearance. She is well-developed.  HENT:     Head: Normocephalic and atraumatic.     Nose: Nose normal.  Cardiovascular:     Rate and Rhythm: Normal rate.  Pulmonary:     Effort: Pulmonary effort is normal.  Musculoskeletal:        General: Normal range of motion.     Cervical back: Normal range of motion.  Skin:    General: Skin is warm and dry.  Neurological:     General: No focal deficit present.     Mental Status: She is alert and oriented to person, place, and time.  Psychiatric:        Attention and Perception: Attention and perception normal.        Mood and Affect: Affect normal. Mood is anxious.        Speech: Speech normal.        Behavior: Behavior normal. Behavior is cooperative.        Thought Content: Thought content normal.        Cognition and Memory:  Cognition and memory normal.        Judgment: Judgment normal.   Review of Systems  Constitutional: Negative.   HENT: Negative.    Eyes: Negative.   Respiratory: Negative.    Cardiovascular: Negative.   Gastrointestinal: Negative.   Genitourinary: Negative.   Musculoskeletal: Negative.   Skin: Negative.   Neurological: Negative.   Endo/Heme/Allergies: Negative.   Psychiatric/Behavioral:  The patient is nervous/anxious.   Blood pressure 133/76, pulse 66, resp. rate 18, SpO2 98 %. There is no height or weight on file to calculate BMI.  Musculoskeletal: Strength & Muscle Tone: within normal limits Gait & Station: normal Patient leans: N/A   [place] MSE Discharge Disposition for Follow up and Recommendations: Based on my evaluation the patient does not appear to have an emergency medical condition and can be discharged with resources and follow up care in outpatient services for Medication Management and Individual Therapy Patient reviewed with Dr. Caswell Corwin. Follow-up with established outpatient psychiatry. Follow-up with outpatient counseling, resources provided. Continue current medications as directed by outpatient psychiatry prescriber.   Lucky Rathke, FNP 04/22/2021, 11:40 AM

## 2021-04-22 NOTE — BH Assessment (Signed)
Dawn Kelley, Routine, MR #[id number]; 60 years old presents this date with her husband, Windell Hummingbird, [phone].  Pt denies SI, HI or AVH.  Pt reports that she is  nervous, and anxious, "I can't take it anymore, the anxious feelings".  Pt admitted to prior [place] diagnosis; also currently taking proscribed medication for symptom management.  MSE signed by patient.

## 2021-04-22 NOTE — ED Notes (Signed)
Discharge instructions provided and Pt stated understanding. Pt alert, orient and ambulatory prior to d/c from facility. Personal belongings returned. Safety maintained.  

## 2021-04-26 DIAGNOSIS — Z20822 Contact with and (suspected) exposure to covid-19: Secondary | ICD-10-CM | POA: Diagnosis not present

## 2021-04-26 DIAGNOSIS — Z79899 Other long term (current) drug therapy: Secondary | ICD-10-CM | POA: Diagnosis not present

## 2021-04-26 DIAGNOSIS — Z888 Allergy status to other drugs, medicaments and biological substances status: Secondary | ICD-10-CM | POA: Diagnosis not present

## 2021-04-26 DIAGNOSIS — Z87891 Personal history of nicotine dependence: Secondary | ICD-10-CM | POA: Diagnosis not present

## 2021-04-26 DIAGNOSIS — F1320 Sedative, hypnotic or anxiolytic dependence, uncomplicated: Secondary | ICD-10-CM | POA: Insufficient documentation

## 2021-04-27 DIAGNOSIS — F1010 Alcohol abuse, uncomplicated: Secondary | ICD-10-CM | POA: Insufficient documentation

## 2021-04-27 DIAGNOSIS — F1220 Cannabis dependence, uncomplicated: Secondary | ICD-10-CM | POA: Insufficient documentation

## 2021-04-30 ENCOUNTER — Telehealth (HOSPITAL_COMMUNITY): Payer: Self-pay | Admitting: Internal Medicine

## 2021-04-30 NOTE — BH Assessment (Signed)
Care Management - Follow Up Discharges   Writer attempted to make contact with patient today and was unsuccessful.  Writer left a HIPPA compliant voice message.   Per chart review, pt will F/up with outpatient psychiatry, Dr. Leanord Hawking for approximately 5 months.

## 2021-05-18 ENCOUNTER — Other Ambulatory Visit: Payer: Self-pay

## 2021-05-18 ENCOUNTER — Ambulatory Visit (HOSPITAL_COMMUNITY): Payer: Medicare Other | Admitting: Clinical

## 2021-05-18 ENCOUNTER — Telehealth (HOSPITAL_COMMUNITY): Payer: Self-pay | Admitting: Clinical

## 2021-05-18 NOTE — Telephone Encounter (Signed)
Pt only interested in Medication Management and since our office does not offer Adult Med Management the pt declined to complete/move forward with completing assessment. OPT suggested [facility] a provider in [facility] offering Adult Med Management.

## 2021-06-01 ENCOUNTER — Ambulatory Visit: Payer: Medicare Other | Admitting: Gastroenterology

## 2021-06-22 ENCOUNTER — Other Ambulatory Visit (INDEPENDENT_AMBULATORY_CARE_PROVIDER_SITE_OTHER): Payer: Self-pay

## 2021-06-22 DIAGNOSIS — K219 Gastro-esophageal reflux disease without esophagitis: Secondary | ICD-10-CM

## 2021-06-22 MED ORDER — PANTOPRAZOLE SODIUM 40 MG PO TBEC: 40.0000 mg | DELAYED_RELEASE_TABLET | Freq: Every day | ORAL | 2 refills | Status: DC

## 2021-06-26 ENCOUNTER — Ambulatory Visit (INDEPENDENT_AMBULATORY_CARE_PROVIDER_SITE_OTHER): Payer: Medicare Other | Admitting: Gastroenterology

## 2021-06-26 ENCOUNTER — Encounter (INDEPENDENT_AMBULATORY_CARE_PROVIDER_SITE_OTHER): Payer: Self-pay | Admitting: Gastroenterology

## 2021-06-26 ENCOUNTER — Telehealth (INDEPENDENT_AMBULATORY_CARE_PROVIDER_SITE_OTHER): Payer: Self-pay | Admitting: *Deleted

## 2021-06-26 ENCOUNTER — Telehealth (INDEPENDENT_AMBULATORY_CARE_PROVIDER_SITE_OTHER): Payer: Self-pay

## 2021-06-26 ENCOUNTER — Other Ambulatory Visit: Payer: Self-pay

## 2021-06-26 VITALS — BP 124/80 | HR 59 | Temp 97.6°F | Ht 65.5 in | Wt 198.7 lb

## 2021-06-26 DIAGNOSIS — R112 Nausea with vomiting, unspecified: Secondary | ICD-10-CM

## 2021-06-26 DIAGNOSIS — K582 Mixed irritable bowel syndrome: Secondary | ICD-10-CM

## 2021-06-26 NOTE — Patient Instructions (Addendum)
Continue pantoprazole 40 mg every day Follow-up closely with psychiatrist for management of active mental health disease Discuss the possibility of starting Librax with psychiatrist

## 2021-06-26 NOTE — Progress Notes (Addendum)
[name], M.D. Gastroenterology & Hepatology [facility] [address] [place], [place] [address]  Primary Care Physician: Neale Burly, MD [place] [address]  I will communicate my assessment and recommendations to the referring MD via EMR.  Problems: IBS-M  History of Present Illness: Dawn Kelley is a 61 y.o. female with past medical history of refractory severe anxiety/depression (hx of ECT therapy), bipolar disorder, PTSD, fibromyalgia, gerd and IBS-M who presents for follow up of abdominal pain.  The patient was last seen on 02/20/2021. At that time, the patient was advised to take Pepcid 40 mg every day and take IBgard to improve her symptoms as well as taking Imodium as needed.  She was emphasized about the importance of following closely with her psychiatrist.She states having significant pain in her chest, in between her breasts but also some pain in the epigastric area intermittently.  Patient reports that she is feeling desperate as she feels that her mental health is not under control at all.  Regarding her GI symptoms, she has felt decreased appetite. Has been gaining weight due to medication as she has been not eating well. She has loose stools. Has frequent nausea with dry heaving with very occasional vomiting.  She has been seen by a new psychiatrist - Dr. Rich Fuchs. Has seen him since around July 2022.  She reports she feels very desperate as she has not felt any improvement in her depression and anxiety. She is also very anxious as she recently lost her medicines and is waiting 5 more days to get her refills.  She started pantoprazole 40 mg every day recently.  Has been taking it through the day compliantly.  Stated that her abdominal pain has mildly improved since then.  It is unclear if she has been taking famotidine.  The patient denies having any fever, chills, hematochezia, melena,  hematemesis, abdominal distention, diarrhea, jaundice, pruritus or weight loss.  Last EGD: 02/03/2020 - showed multiple small gastric polyps which were sessile fundic gland polyp.  Small bowel was biopsied, histology was normal.  Colonoscopy performed the same day showed a 3 mm polyp in the sigmoid colon that was removed with a cold snare, as well as hemorrhoids, histology was consistent with a tubular adenoma, for which the patient was recommended to have a repeat colonoscopy in 7 years.  Past Medical History: Past Medical History:  Diagnosis Date   Anxiety    Bipolar 1 disorder ([place])    Depression    OCD (obsessive compulsive disorder)    PTSD (post-traumatic stress disorder)    S/P ECT (electroconvulsive therapy)     Past Surgical History: Past Surgical History:  Procedure Laterality Date   BIOPSY  02/03/2020   Procedure: BIOPSY;  Surgeon: Harvel Quale, MD;  [facility];  Service: Gastroenterology;;   COLONOSCOPY WITH PROPOFOL N/A 02/03/2020   [name]: 34mm polyp in sigmoid colon  as well as hemorrhoids, histology consistent with tubular adenoma   ESOPHAGOGASTRODUODENOSCOPY (EGD) WITH PROPOFOL N/A 02/03/2020   castaneda: small gastric polyps, sessile, and fundic gland, small bowel bx normal   INDUCED ABORTION N/A 1980   POLYPECTOMY  02/03/2020   Procedure: POLYPECTOMY;  Surgeon: Harvel Quale, MD;  Location: [facility];  Service: Gastroenterology;;   VEIN SURGERY      Family History: Family History  Problem Relation Age of Onset   Depression Mother    Pneumonia Mother     Social  History: Social History   Tobacco Use  Smoking Status Never  Smokeless Tobacco Never   Social History   Substance and Sexual Activity  Alcohol Use Yes   Comment: occasionally   Social History   Substance and Sexual Activity  Drug Use Yes   Types: Marijuana   Comment: pt's husband reports she uses it to help with anxiety and depression     Allergies: Allergies  Allergen Reactions   Escitalopram Nausea And Vomiting    Tolerated citalopram with out difficulty for many years.   Seroquel [Quetiapine Fumarate] Shortness Of Breath   Trazodone Other (See Comments)    Hives.    Medications: Current Outpatient Medications  Medication Sig Dispense Refill   acetaminophen (TYLENOL) 325 MG tablet Take 325 mg by mouth every 6 (six) hours as needed. 975 mg  per day for one week per patient.     clonazePAM (KLONOPIN) 0.5 MG tablet Take 0.5 mg by mouth in the morning, at noon, and at bedtime.     diphenhydrAMINE (BENADRYL) 25 mg capsule Take 25 mg by mouth. Take as needed     divalproex (DEPAKOTE ER) 250 MG 24 hr tablet Take 250 mg by mouth daily.     levothyroxine (SYNTHROID) 25 MCG tablet Take 25 mcg by mouth daily before breakfast.     losartan (COZAAR) 25 MG tablet Take 25 mg by mouth daily.     OLANZapine (ZYPREXA) 5 MG tablet Take 5 mg by mouth at bedtime.     OVER THE COUNTER MEDICATION Probiotic 1 po Qd per patient.     OVER THE COUNTER MEDICATION B 12 one po QD per patient .     OVER THE COUNTER MEDICATION Vit D 2 1.2/50,000 IU once per week.     pantoprazole (PROTONIX) 40 MG tablet Take 1 tablet (40 mg total) by mouth daily. 90 tablet 2   propranolol (INDERAL) 20 MG tablet Take 20 mg by mouth 3 (three) times daily. 10mg  one tid     rosuvastatin (CRESTOR) 20 MG tablet Take 20 mg by mouth daily.     Vilazodone HCl 20 MG TABS Take 20 mg by mouth daily.     busPIRone (BUSPAR) 10 MG tablet Take 10 mg by mouth 3 (three) times daily. (Patient not taking: Reported on 06/26/2021)     carbamazepine (TEGRETOL) 200 MG tablet Take 200 mg by mouth in the morning and at bedtime. (Patient not taking: Reported on 06/26/2021)     famotidine (PEPCID) 40 MG tablet Take 1 tablet (40 mg total) by mouth daily. (Patient not taking: Reported on 06/26/2021) 90 tablet 3   risperiDONE (RISPERDAL) 0.5 MG tablet Take 1 tablet (0.5 mg total) by mouth at  bedtime. (Patient not taking: Reported on 06/26/2021) 30 tablet 0   No current facility-administered medications for this visit.    Review of Systems: GENERAL: negative for malaise, night sweats HEENT: No changes in hearing or vision, no nose bleeds or other nasal problems. NECK: Negative for lumps, goiter, pain and significant neck swelling RESPIRATORY: Negative for cough, wheezing CARDIOVASCULAR: Negative for chest pain, leg swelling, palpitations, orthopnea GI: SEE HPI MUSCULOSKELETAL: Negative for joint pain or swelling, back pain, and muscle pain. SKIN: Negative for lesions, rash PSYCH: Negative for sleep disturbance, mood disorder and recent psychosocial stressors. HEMATOLOGY Negative for prolonged bleeding, bruising easily, and swollen nodes. ENDOCRINE: Negative for cold or heat intolerance, polyuria, polydipsia and goiter. NEURO: negative for tremor, gait imbalance, syncope and seizures. The remainder of the review  of systems is noncontributory.   Physical Exam: BP 124/80 (BP Location: Left Arm, Patient Position: Sitting, Cuff Size: Large)    Pulse (!) 59    Temp 97.6 F (36.4 C) (Oral)    Ht 5' 5.5" (1.664 m)    Wt 198 lb 11.2 oz (90.1 kg)    BMI 32.56 kg/m  GENERAL: The patient is AO x3, in no acute distress.  Obese HEENT: Head is normocephalic and atraumatic. EOMI are intact. Mouth is well hydrated and without lesions. NECK: Supple. No masses LUNGS: Clear to auscultation. No presence of rhonchi/wheezing/rales. Adequate chest expansion HEART: RRR, normal s1 and s2. ABDOMEN: Tender to palpation in the epigastric and periumbilical area, no guarding, no peritoneal signs, and nondistended. BS +. No masses. EXTREMITIES: Without any cyanosis, clubbing, rash, lesions or edema. NEUROLOGIC: AOx3, no focal motor deficit. SKIN: no jaundice, no rashes  Imaging/Labs: as above  I personally reviewed and interpreted the available labs, imaging and endoscopic files.  Impression and  Plan: Tamieka Rancourt is a 61 y.o. female with past medical history of refractory severe anxiety/depression (hx of ECT therapy), bipolar disorder, PTSD, fibromyalgia, gerd and IBS-M who presents for follow up of abdominal pain.  The patient has presented chronic episodes of abdominal pain which have been investigated previously with EGD without any abnormalities explaining her symptoms.  Also had a colonoscopy that was unremarkable.  Her symptoms were relatively well controlled after she underwent electroconvulsive therapy which stabilized transiently her mental health, however as her depression and anxiety have worsened her abdominal pain and nausea have also resurfaced.  I had a thorough discussion with the patient for 40 minutes regarding the fact that it is likely her symptoms are related to increased activity of her brain gut axis.  Due to this, it is imperative that she follows closely with her psychiatrist to try to achieve better control of her mental health.  She could discuss the possibility of switching her clonazepam to Librax as this may also provide some improvement of her abdominal pain.  As she had presented some recent improvement with the use of pantoprazole, I advised her to continue taking this medication on a daily basis.  - Continue pantoprazole 40 mg every day - Follow-up closely with psychiatrist for management of active mental health disease - Discuss the possibility of starting Librax with psychiatrist  All questions were answered.      Harvel Quale, MD Gastroenterology and Hepatology [facility]

## 2021-06-26 NOTE — Telephone Encounter (Signed)
Patient had left message on my VM asking to be seen today due to hurting. Dr Jenetta Downer had an opening at 1145 so I called patient and gave her that apt. She called back about an hour later and asked if we had something sooner she was in terrible pain. I explained that was all he had for today, she said "that's too bad, you're being silly, and I'll see you then."

## 2021-06-26 NOTE — Telephone Encounter (Signed)
Patient was seen this am and wanted to know if she needed to continue the famotidine ant the pantoprazole? Per Dr. Jenetta Downer patient may take the famotidine 20 mg QHS and take the Pantoprazole 30 mins prior to breakfast. Patient made aware.

## 2021-06-29 DIAGNOSIS — R1013 Epigastric pain: Secondary | ICD-10-CM | POA: Diagnosis not present

## 2021-06-29 DIAGNOSIS — R9431 Abnormal electrocardiogram [ECG] [EKG]: Secondary | ICD-10-CM | POA: Diagnosis not present

## 2021-07-01 ENCOUNTER — Other Ambulatory Visit: Payer: Self-pay

## 2021-07-01 ENCOUNTER — Encounter (HOSPITAL_COMMUNITY): Payer: Self-pay

## 2021-07-01 ENCOUNTER — Emergency Department (HOSPITAL_COMMUNITY)
Admission: EM | Admit: 2021-07-01 | Discharge: 2021-07-01 | Disposition: A | Discharge status: Home / Self Care | Payer: Medicare Other | Attending: Emergency Medicine | Admitting: Emergency Medicine

## 2021-07-01 ENCOUNTER — Emergency Department (HOSPITAL_COMMUNITY): Payer: Medicare Other

## 2021-07-01 DIAGNOSIS — Z79899 Other long term (current) drug therapy: Secondary | ICD-10-CM | POA: Diagnosis not present

## 2021-07-01 DIAGNOSIS — D72829 Elevated white blood cell count, unspecified: Secondary | ICD-10-CM | POA: Diagnosis not present

## 2021-07-01 DIAGNOSIS — N12 Tubulo-interstitial nephritis, not specified as acute or chronic: Secondary | ICD-10-CM | POA: Insufficient documentation

## 2021-07-01 DIAGNOSIS — Z20822 Contact with and (suspected) exposure to covid-19: Secondary | ICD-10-CM | POA: Diagnosis not present

## 2021-07-01 DIAGNOSIS — R079 Chest pain, unspecified: Secondary | ICD-10-CM | POA: Diagnosis not present

## 2021-07-01 DIAGNOSIS — F419 Anxiety disorder, unspecified: Secondary | ICD-10-CM | POA: Diagnosis not present

## 2021-07-01 DIAGNOSIS — R1013 Epigastric pain: Secondary | ICD-10-CM | POA: Diagnosis not present

## 2021-07-01 LAB — COMPREHENSIVE METABOLIC PANEL
ALT: 16 U/L (ref 0–44)
AST: 17 U/L (ref 15–41)
Albumin: 4.1 g/dL (ref 3.5–5.0)
Alkaline Phosphatase: 71 U/L (ref 38–126)
Anion gap: 13 (ref 5–15)
BUN: 12 mg/dL (ref 8–23)
CO2: 21 mmol/L — ABNORMAL LOW (ref 22–32)
Calcium: 9.3 mg/dL (ref 8.9–10.3)
Chloride: 105 mmol/L (ref 98–111)
Creatinine, Ser: 0.7 mg/dL (ref 0.44–1.00)
GFR, Estimated: >60 mL/min (ref >60)
Glucose, Bld: 119 mg/dL — ABNORMAL HIGH (ref 70–99)
Potassium: 3.8 mmol/L (ref 3.5–5.1)
Sodium: 139 mmol/L (ref 135–145)
Total Bilirubin: 0.5 mg/dL (ref 0.3–1.2)
Total Protein: 7.8 g/dL (ref 6.5–8.1)

## 2021-07-01 LAB — CBC WITH DIFFERENTIAL/PLATELET
Abs Immature Granulocytes: 0.02 10*3/uL (ref 0.00–0.07)
Basophils Absolute: 0 10*3/uL (ref 0.0–0.1)
Basophils Relative: 0 %
Eosinophils Absolute: 0.1 10*3/uL (ref 0.0–0.5)
Eosinophils Relative: 1 %
HCT: 42.9 % (ref 36.0–46.0)
Hemoglobin: 14.4 g/dL (ref 12.0–15.0)
Immature Granulocytes: 0 %
Lymphocytes Relative: 20 %
Lymphs Abs: 2.2 10*3/uL (ref 0.7–4.0)
MCH: 30.9 pg (ref 26.0–34.0)
MCHC: 33.6 g/dL (ref 30.0–36.0)
MCV: 92.1 fL (ref 80.0–100.0)
Monocytes Absolute: 0.5 10*3/uL (ref 0.1–1.0)
Monocytes Relative: 4 %
Neutro Abs: 8.1 10*3/uL — ABNORMAL HIGH (ref 1.7–7.7)
Neutrophils Relative %: 75 %
Platelets: 304 10*3/uL (ref 150–400)
RBC: 4.66 MIL/uL (ref 3.87–5.11)
RDW: 14.3 % (ref 11.5–15.5)
WBC: 10.9 10*3/uL — ABNORMAL HIGH (ref 4.0–10.5)
nRBC: 0 % (ref 0.0–0.2)

## 2021-07-01 LAB — URINALYSIS, ROUTINE W REFLEX MICROSCOPIC
Bilirubin Urine: NEGATIVE
Glucose, UA: NEGATIVE mg/dL
Hgb urine dipstick: NEGATIVE
Ketones, ur: NEGATIVE mg/dL
Nitrite: NEGATIVE
Protein, ur: NEGATIVE mg/dL
Specific Gravity, Urine: 1.01 (ref 1.005–1.030)
WBC, UA: >50 WBC/hpf — ABNORMAL HIGH (ref 0–5)
pH: 7 (ref 5.0–8.0)

## 2021-07-01 LAB — RAPID URINE DRUG SCREEN, HOSP PERFORMED
Amphetamines: NOT DETECTED
Barbiturates: NOT DETECTED
Benzodiazepines: POSITIVE — AB
Cocaine: NOT DETECTED
Opiates: NOT DETECTED
Tetrahydrocannabinol: POSITIVE — AB

## 2021-07-01 LAB — RESP PANEL BY RT-PCR (FLU A&B, COVID) ARPGX2
Influenza A by PCR: NEGATIVE
Influenza B by PCR: NEGATIVE
SARS Coronavirus 2 by RT PCR: NEGATIVE

## 2021-07-01 LAB — LIPASE, BLOOD: Lipase: 38 U/L (ref 11–51)

## 2021-07-01 LAB — TROPONIN I (HIGH SENSITIVITY): Troponin I (High Sensitivity): 2 ng/L (ref <18)

## 2021-07-01 LAB — ETHANOL: Alcohol, Ethyl (B): <10 mg/dL (ref <10)

## 2021-07-01 IMAGING — DX DG CHEST 1V PORT
1 series · 1 of 1 positions shown · non-contrast
Comparison: Portable chest [DATE].

CLINICAL DATA: 60-year-old female with epigastric pain. Frequent
chest pain.

EXAM:
PORTABLE CHEST 1 VIEW

[chest ap]
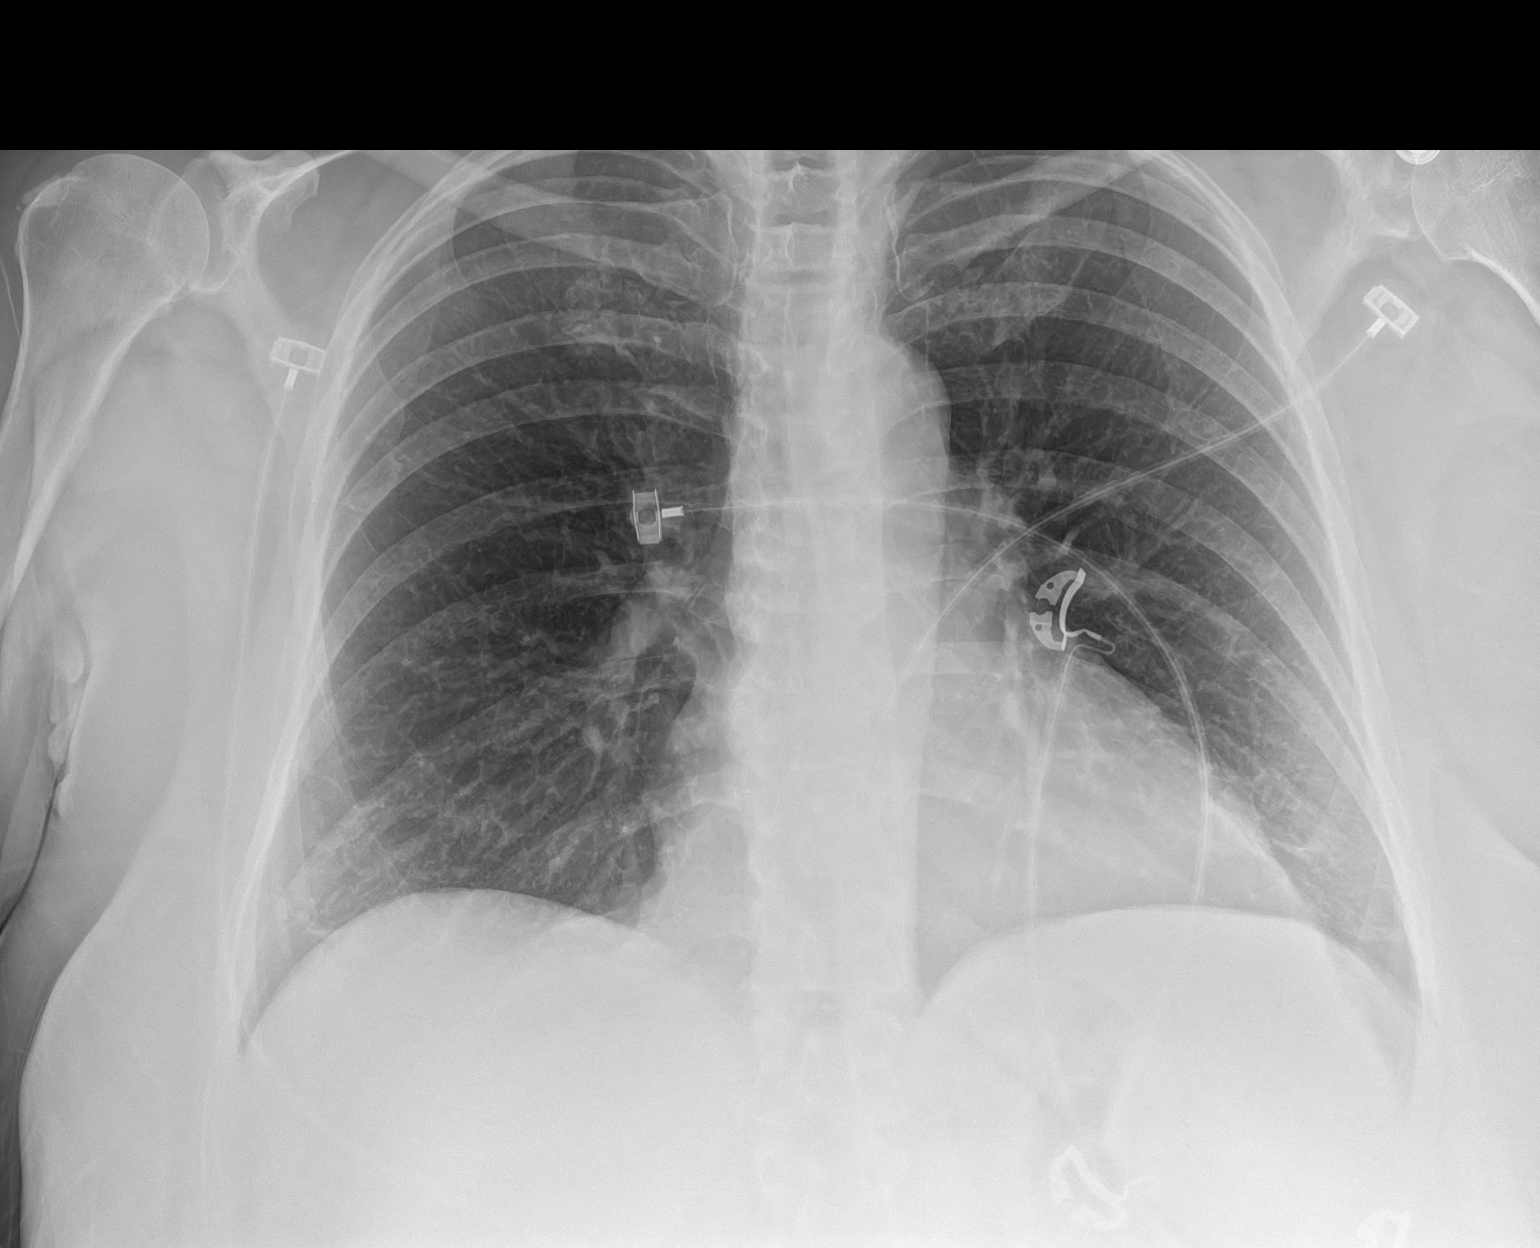

[1 of 1 positions shown; findings below may reference images not displayed]

FINDINGS: Portable AP upright view at [J4] hours. Lung volumes and mediastinal
contours are stable and within normal. Allowing for portable
technique the lungs are clear. No pneumothorax or pleural effusion.
Negative visible bowel gas and osseous structures.
IMPRESSION: Negative portable chest.

## 2021-07-01 MED ORDER — FAMOTIDINE IN NACL 20-0.9 MG/50ML-% IV SOLN
20.0000 mg | Freq: Once | INTRAVENOUS | Status: AC
  Administered 2021-07-01: 20 mg via INTRAVENOUS
  Filled 2021-07-01: qty 50

## 2021-07-01 MED ORDER — ONDANSETRON HCL 4 MG PO TABS
4.0000 mg | ORAL_TABLET | Freq: Once | ORAL | Status: AC
  Administered 2021-07-01: 4 mg via ORAL
  Filled 2021-07-01: qty 1

## 2021-07-01 MED ORDER — ALUM & MAG HYDROXIDE-SIMETH 200-200-20 MG/5ML PO SUSP
30.0000 mL | Freq: Once | ORAL | Status: AC
  Administered 2021-07-01: 30 mL via ORAL
  Filled 2021-07-01: qty 30

## 2021-07-01 MED ORDER — CEFPODOXIME PROXETIL 200 MG PO TABS: 200.0000 mg | ORAL_TABLET | Freq: Two times a day (BID) | ORAL | 0 refills | Status: AC

## 2021-07-01 MED ORDER — LACTATED RINGERS IV BOLUS
1000.0000 mL | Freq: Once | INTRAVENOUS | Status: AC
  Administered 2021-07-01: 1000 mL via INTRAVENOUS

## 2021-07-01 MED ORDER — SODIUM CHLORIDE 0.9 % IV SOLN
1.0000 g | Freq: Once | INTRAVENOUS | Status: AC
  Administered 2021-07-01: 1 g via INTRAVENOUS
  Filled 2021-07-01: qty 10

## 2021-07-01 MED ORDER — LIDOCAINE VISCOUS HCL 2 % MT SOLN
15.0000 mL | Freq: Once | OROMUCOSAL | Status: AC
  Administered 2021-07-01: 15 mL via ORAL
  Filled 2021-07-01: qty 15

## 2021-07-01 MED ORDER — ONDANSETRON HCL 4 MG PO TABS: 4.0000 mg | ORAL_TABLET | ORAL | 0 refills | Status: DC | PRN

## 2021-07-01 NOTE — ED Notes (Signed)
Patient Alert and oriented to baseline. Stable and ambulatory to baseline. Patient verbalized understanding of the discharge instructions.  Patient belongings were taken by the patient.   

## 2021-07-01 NOTE — ED Notes (Signed)
Pt given water per pt request. Pt states she feels much better and would like to go home.

## 2021-07-01 NOTE — ED Triage Notes (Signed)
Pt presents to ED with complaints of anxiety. Pt states this happens everyday with pain in her chest, increased anxiety, nausea, soft stools x 3 years.

## 2021-07-01 NOTE — Discharge Instructions (Addendum)
It was a pleasure caring for you today in the emergency department. ° °Please return to the emergency department for any worsening or worrisome symptoms. ° ° °

## 2021-07-01 NOTE — ED Provider Notes (Signed)
[facility] Provider Note   CSN: [id number] Arrival date & time: 07/01/21  2831     History  Chief Complaint  Patient presents with   Anxiety    Dawn Kelley is a 61 y.o. female.  This is a 61 y.o. female  with significant medical history as below, including anxiety, BPD, depression, OCD, PTSD who presents to the ED with complaint of epigastric discomfort.  Patient reports symptom duration approximately 3 years.  Has not acutely worsened in the past 6 weeks.  She follows with PCP and psychiatry.  Was started on Protonix 2 weeks ago for the symptoms and they have not significantly improved.  Patient not take her morning medications.  She does have intermittent THC and alcohol use reported.  Last p.o. intake was yesterday evening.  No change urination.  Stools reported as "soft" but nonmelanotic, no blood per rectum.  No pain with defecation.  No medications this morning.  No change to p.o.  Pain described as aching, pressure to the epigastrium and radiates to the umbilicus.  Does not radiate to the back.  No alleviating or exacerbating factors reported.  Reports she has had this pain for greater than 6 months and has not acutely changed or worsened. She is having nausea with dry heaving.   Accompanied by spouse.   Past Medical History: No date: Anxiety No date: Bipolar 1 disorder (HCC) No date: Depression No date: OCD (obsessive compulsive disorder) No date: PTSD (post-traumatic stress disorder) No date: S/P ECT (electroconvulsive therapy)  Past Surgical History: 02/03/2020: BIOPSY     Comment:  Procedure: BIOPSY;  Surgeon: Harvel Quale,               MD;  Location: [facility];  Service:               Gastroenterology;; 02/03/2020: COLONOSCOPY WITH PROPOFOL; N/A     Comment:  [name]: 44mm polyp in sigmoid colon  as well as               hemorrhoids, histology consistent with tubular adenoma 02/03/2020: ESOPHAGOGASTRODUODENOSCOPY (EGD) WITH  PROPOFOL; N/A     Comment:  castaneda: small gastric polyps, sessile, and fundic               gland, small bowel bx normal 1980: INDUCED ABORTION; N/A 02/03/2020: POLYPECTOMY     Comment:  Procedure: POLYPECTOMY;  Surgeon: Harvel Quale, MD;  Location: [facility];  Service:               Gastroenterology;; No date: VEIN SURGERY    The history is provided by the patient and the spouse. No language interpreter was used.  Anxiety Associated symptoms include abdominal pain. Pertinent negatives include no chest pain, no headaches and no shortness of breath.      Home Medications Prior to Admission medications   Medication Sig Start Date End Date Taking? Authorizing Provider  cefpodoxime (VANTIN) 200 MG tablet Take 1 tablet (200 mg total) by mouth 2 (two) times daily for 14 days. 07/01/21 07/15/21 Yes Wynona Dove A, DO  ondansetron (ZOFRAN) 4 MG tablet Take 1 tablet (4 mg total) by mouth every 4 (four) hours as needed for nausea or vomiting. 07/01/21  Yes Jeanell Sparrow, DO  acetaminophen (TYLENOL) 325 MG tablet Take 325 mg by mouth every 6 (six) hours as needed. 975 mg  per day for  one week per patient.    [provider]  busPIRone (BUSPAR) 10 MG tablet Take 10 mg by mouth 3 (three) times daily. Patient not taking: Reported on 06/26/2021    [provider]  carbamazepine (TEGRETOL) 200 MG tablet Take 200 mg by mouth in the morning and at bedtime. Patient not taking: Reported on 06/26/2021    [provider]  clonazePAM (KLONOPIN) 0.5 MG tablet Take 0.5 mg by mouth in the morning, at noon, and at bedtime. 03/29/20   [provider]  diphenhydrAMINE (BENADRYL) 25 mg capsule Take 25 mg by mouth. Take as needed    [provider]  divalproex (DEPAKOTE ER) 250 MG 24 hr tablet Take 250 mg by mouth daily.    [provider]  famotidine (PEPCID) 40 MG tablet Take 1 tablet (40 mg total) by mouth daily. Patient not  taking: Reported on 06/26/2021 02/20/21   Gabriel Rung, NP  levothyroxine (SYNTHROID) 25 MCG tablet Take 25 mcg by mouth daily before breakfast.    [provider]  losartan (COZAAR) 25 MG tablet Take 25 mg by mouth daily.    [provider]  OLANZapine (ZYPREXA) 5 MG tablet Take 5 mg by mouth at bedtime.    [provider]  OVER THE COUNTER MEDICATION Probiotic 1 po Qd per patient.    [provider]  OVER THE COUNTER MEDICATION B 12 one po QD per patient .    [provider]  OVER THE COUNTER MEDICATION Vit D 2 1.2/50,000 IU once per week.    [provider]  pantoprazole (PROTONIX) 40 MG tablet Take 1 tablet (40 mg total) by mouth daily. 06/22/21   Harvel Quale, MD  propranolol (INDERAL) 20 MG tablet Take 20 mg by mouth 3 (three) times daily. 10mg  one tid 03/29/20   [provider]  risperiDONE (RISPERDAL) 0.5 MG tablet Take 1 tablet (0.5 mg total) by mouth at bedtime. Patient not taking: Reported on 06/26/2021 02/27/20   Money, Lowry Ram, FNP  rosuvastatin (CRESTOR) 20 MG tablet Take 20 mg by mouth daily.    [provider]  Vilazodone HCl 20 MG TABS Take 20 mg by mouth daily.    [provider]      Allergies    Escitalopram, Seroquel [quetiapine fumarate], and Trazodone    Review of Systems   Review of Systems  Constitutional:  Negative for activity change and fever.  HENT:  Negative for facial swelling and trouble swallowing.   Eyes:  Negative for discharge and redness.  Respiratory:  Negative for cough and shortness of breath.   Cardiovascular:  Negative for chest pain and palpitations.  Gastrointestinal:  Positive for abdominal pain and nausea.  Genitourinary:  Negative for dysuria and flank pain.  Musculoskeletal:  Negative for back pain and gait problem.  Skin:  Negative for pallor and rash.  Neurological:  Negative for syncope and headaches.   Physical Exam Updated Vital Signs BP  136/61    Pulse (!) 56    Temp 97.7 F (36.5 C) (Oral)    Resp 16    Ht 5\' 5"  (1.651 m)    Wt 88.5 kg    SpO2 95%    BMI 32.45 kg/m  Physical Exam Vitals and nursing note reviewed.  Constitutional:      General: She is not in acute distress.    Appearance: Normal appearance. She is not ill-appearing, toxic-appearing or diaphoretic.  HENT:     Head: Normocephalic  and atraumatic.     Right Ear: External ear normal.     Left Ear: External ear normal.     Nose: Nose normal.     Mouth/Throat:     Mouth: Mucous membranes are moist.  Eyes:     General: No scleral icterus.       Right eye: No discharge.        Left eye: No discharge.  Cardiovascular:     Rate and Rhythm: Normal rate and regular rhythm.     Pulses: Normal pulses.     Heart sounds: Normal heart sounds.  Pulmonary:     Effort: Pulmonary effort is normal. No respiratory distress.     Breath sounds: Normal breath sounds.  Abdominal:     General: Abdomen is flat.     Tenderness: There is no abdominal tenderness.  Musculoskeletal:        General: Normal range of motion.     Cervical back: Normal range of motion.     Right lower leg: No edema.     Left lower leg: No edema.  Skin:    General: Skin is warm and dry.     Capillary Refill: Capillary refill takes less than 2 seconds.  Neurological:     Mental Status: She is alert.  Psychiatric:        Mood and Affect: Mood is anxious.        Speech: Speech is rapid and pressured.        Behavior: Behavior normal.    ED Results / Procedures / Treatments   Labs (all labs ordered are listed, but only abnormal results are displayed) Labs Reviewed  CBC WITH DIFFERENTIAL/PLATELET - Abnormal; Notable for the following components:      Result Value   WBC 10.9 (*)    Neutro Abs 8.1 (*)    All other components within normal limits  COMPREHENSIVE METABOLIC PANEL - Abnormal; Notable for the following components:   CO2 21 (*)    Glucose, Bld 119 (*)    All other components  within normal limits  URINALYSIS, ROUTINE W REFLEX MICROSCOPIC - Abnormal; Notable for the following components:   APPearance HAZY (*)    Leukocytes,Ua LARGE (*)    WBC, UA >50 (*)    Bacteria, UA RARE (*)    Non Squamous Epithelial 0-5 (*)    All other components within normal limits  RAPID URINE DRUG SCREEN, HOSP PERFORMED - Abnormal; Notable for the following components:   Benzodiazepines POSITIVE (*)    Tetrahydrocannabinol POSITIVE (*)    All other components within normal limits  RESP PANEL BY RT-PCR (FLU A&B, COVID) ARPGX2  URINE CULTURE  LIPASE, BLOOD  ETHANOL  TROPONIN I (HIGH SENSITIVITY)    EKG EKG Interpretation  Date/Time:  Saturday July 01 2021 07:52:02 EST Ventricular Rate:  62 PR Interval:  124 QRS Duration: 105 QT Interval:  396 QTC Calculation: 403 R Axis:   7 Text Interpretation: Sinus rhythm Low voltage, precordial leads Borderline T abnormalities, anterior leads Similar to prior tracing no stemi Confirmed by Wynona Dove ([id number]) on 07/01/2021 9:10:32 AM  Radiology DG Chest Portable 1 View  Result Date: 07/01/2021 CLINICAL DATA:  61 year old female with epigastric pain. Frequent chest pain. EXAM: PORTABLE CHEST 1 VIEW COMPARISON:  Portable chest 03/02/2020. FINDINGS: Portable AP upright view at 0757 hours. Lung volumes and mediastinal contours are stable and within normal. Allowing for portable technique the lungs are clear. No pneumothorax or pleural effusion. Negative visible bowel  gas and osseous structures. IMPRESSION: Negative portable chest. Electronically Signed   By: Genevie Ann M.D.   On: 07/01/2021 08:22    Procedures Procedures    Medications Ordered in ED Medications  lactated ringers bolus 1,000 mL (0 mLs Intravenous Stopped 07/01/21 0926)  ondansetron (ZOFRAN) tablet 4 mg (4 mg Oral Given 07/01/21 0806)  famotidine (PEPCID) IVPB 20 mg premix (0 mg Intravenous Stopped 07/01/21 0926)  alum & mag hydroxide-simeth (MAALOX/MYLANTA) 200-200-20 MG/5ML  suspension 30 mL (30 mLs Oral Given 07/01/21 0806)    And  lidocaine (XYLOCAINE) 2 % viscous mouth solution 15 mL (15 mLs Oral Given 07/01/21 0806)  cefTRIAXone (ROCEPHIN) 1 g in sodium chloride 0.9 % 100 mL IVPB (0 g Intravenous Stopped 07/01/21 0955)    ED Course/ Medical Decision Making/ A&P                           Medical Decision Making Amount and/or Complexity of Data Reviewed Independent Historian: spouse External Data Reviewed: labs, radiology, ECG and notes. Labs: ordered. Decision-making details documented in ED Course. Radiology: ordered. Decision-making details documented in ED Course. ECG/medicine tests: ordered and independent interpretation performed. Decision-making details documented in ED Course.  Risk OTC drugs. Prescription drug management.    CC: Anxiousness, epigastric discomfort  This patient presents to the Emergency Department for the above complaint. This involves an extensive number of treatment options and is a complaint that carries with it a high risk of complications and morbidity. Vital signs were reviewed. Serious etiologies considered.  Record review:  Previous records obtained and reviewed   Additional history obtained from spouse at bedside  Medical and surgical history as noted above.   Work up as above, notable for:   Labs & imaging results that were available during my care of the patient were reviewed by me and considered in my medical decision making.   I ordered imaging studies which included x-ray of chest and I reviewed imaging which showed no acute process  Cardiac monitoring reviewed and interpreted personally which shows normal sinus rhythm  Social determinants of health include - N/a   Labs reviewed patient would likely pyelonephritis given positive urinalysis with nausea and vomiting.  She has mild leukocytosis.  Renal function is stable.  Afebrile.  She is not septic.  She is tolerating p.o. intake.  Urine culture was sent  after starting IV antibiotics.  UDS positive for benzodiazepines and THC.  EKG without evidence acute ischemia, troponin negative.  Chest ray negative.  No chest pain.  ACS unlikely.  Management: Rocephin, GI cocktail  Reassessment:  Patient feeling significantly better following intervention emergency department.  She is tolerating oral intake.  Patient with pyelonephritis, she is tolerating p.o.  She is not septic.  Overall she is well-appearing.  Reasonable to trial outpatient oral antibiotics, close follow-up with PCP.  Patient and spouse at bedside are agreeable to plan.  She was given first dose of Rocephin in the ER and will start on oral antibiotics for home.   The patient improved significantly and was discharged in stable condition. Detailed discussions were had with the patient regarding current findings, and need for close f/u with PCP or on call doctor. The patient has been instructed to return immediately if the symptoms worsen in any way for re-evaluation. Patient verbalized understanding and is in agreement with current care plan. All questions answered prior to discharge.         This chart  was dictated using voice recognition software.  Despite best efforts to proofread,  errors can occur which can change the documentation meaning.         Final Clinical Impression(s) / ED Diagnoses Final diagnoses:  Pyelonephritis  Anxiety    Rx / DC Orders ED Discharge Orders          Ordered    cefpodoxime (VANTIN) 200 MG tablet  2 times daily        07/01/21 0947    ondansetron (ZOFRAN) 4 MG tablet  Every 4 hours PRN        07/01/21 [place], Arseniy Toomey A, DO 07/01/21 1603

## 2021-07-02 LAB — URINE CULTURE: Culture: <10000 — AB

## 2021-07-06 DIAGNOSIS — K2970 Gastritis, unspecified, without bleeding: Secondary | ICD-10-CM | POA: Diagnosis not present

## 2021-07-19 ENCOUNTER — Telehealth (INDEPENDENT_AMBULATORY_CARE_PROVIDER_SITE_OTHER): Payer: Self-pay | Admitting: *Deleted

## 2021-07-19 ENCOUNTER — Other Ambulatory Visit (INDEPENDENT_AMBULATORY_CARE_PROVIDER_SITE_OTHER): Payer: Self-pay | Admitting: *Deleted

## 2021-07-19 DIAGNOSIS — R197 Diarrhea, unspecified: Secondary | ICD-10-CM | POA: Diagnosis not present

## 2021-07-19 NOTE — Telephone Encounter (Signed)
Patient called and states she having diarrhea for 6 months or more and she remembers being told in the past that protonix could cause diarrhea. She would like to know what she can take in the place of protonix '40mg'$  one daily. She is also taking famotidine '40mg'$  qhs. She states her pcp order a stool test to check for parasite and she dropped off that sample this morning at lab. States she will call us back if that sample shows anything concerning.  ? ?[facility]  ? ?[phone] ?

## 2021-07-19 NOTE — Telephone Encounter (Signed)
She can take famotidine twice a day and stop Protonix. That way her heartburn can still be controlled. ?Thanks ?

## 2021-07-19 NOTE — Telephone Encounter (Signed)
Called and discussed with patient and she verbalized understanding.  

## 2021-08-02 ENCOUNTER — Other Ambulatory Visit (INDEPENDENT_AMBULATORY_CARE_PROVIDER_SITE_OTHER): Payer: Self-pay | Admitting: Gastroenterology

## 2021-08-02 ENCOUNTER — Telehealth (INDEPENDENT_AMBULATORY_CARE_PROVIDER_SITE_OTHER): Payer: Self-pay

## 2021-08-02 DIAGNOSIS — R112 Nausea with vomiting, unspecified: Secondary | ICD-10-CM

## 2021-08-02 MED ORDER — ONDANSETRON HCL 4 MG PO TABS: 4.0000 mg | ORAL_TABLET | Freq: Three times a day (TID) | ORAL | 2 refills | Status: DC | PRN

## 2021-08-02 MED ORDER — PROMETHAZINE HCL 12.5 MG PO TABS: 12.5000 mg | ORAL_TABLET | Freq: Three times a day (TID) | ORAL | 0 refills | Status: DC | PRN

## 2021-08-02 NOTE — Telephone Encounter (Signed)
She can take phenergan as needed but should try first the zofran, I sent a prescription.  ?I agree this could be related to her psych meds ?

## 2021-08-02 NOTE — Telephone Encounter (Signed)
Patient called today to report her Ondansetron is not working for her nausea and vomiting. She states she has had nausea and vomiting off and on for months she thinks this is related to her psych meds. She also takes famotidine 20 mg two Qhs. She states she has been taking the ondansetron for months but not helping. I asked how she had any left as Dr. Jenetta Downer had only given her # 6 with the last fill we did. She states Dr. Freida Busman gave her some,but he was too busy to ask. If anything else is suggested she would like it to be called into [facility] ?

## 2021-08-02 NOTE — Addendum Note (Signed)
Addended by: Harvel Quale on: 08/02/2021 10:52 AM ? ? Modules accepted: Orders ? ?

## 2021-08-02 NOTE — Telephone Encounter (Signed)
I'll send the zofran, which she needsw to tak first but if not working and very nauseated can take the phenergan ?

## 2021-08-02 NOTE — Telephone Encounter (Signed)
Patient aware of all,but states she has ran out of the zofran. Please advise. Do we need to call in more or does she just need to take the phenergan? Please advise. ?

## 2021-08-02 NOTE — Telephone Encounter (Signed)
Patient states understanding.

## 2021-08-17 ENCOUNTER — Telehealth (INDEPENDENT_AMBULATORY_CARE_PROVIDER_SITE_OTHER): Payer: Self-pay

## 2021-08-17 NOTE — Telephone Encounter (Signed)
Patient aware of all.

## 2021-08-17 NOTE — Telephone Encounter (Signed)
Patient called today stating she has had nausea, vomiting, and diarrhea on going for the last year. She is taking promethazine and ondansetron prn. She is not taking any imodium. Please advise.  ?

## 2021-08-22 ENCOUNTER — Ambulatory Visit: Payer: Medicare Other | Admitting: Obstetrics & Gynecology

## 2021-08-28 ENCOUNTER — Telehealth (INDEPENDENT_AMBULATORY_CARE_PROVIDER_SITE_OTHER): Payer: Self-pay

## 2021-08-28 DIAGNOSIS — R197 Diarrhea, unspecified: Secondary | ICD-10-CM | POA: Diagnosis not present

## 2021-08-28 DIAGNOSIS — Z0000 Encounter for general adult medical examination without abnormal findings: Secondary | ICD-10-CM | POA: Diagnosis not present

## 2021-08-28 NOTE — Telephone Encounter (Signed)
Patient aware of all and she was transferred to [facility] to schedule for the next available appointment. ?

## 2021-08-28 NOTE — Telephone Encounter (Signed)
She can try taking peptobismol every 6 hours to improve the diarrhea, please ask her to make a follow up appointment as soon as possible ?

## 2021-08-28 NOTE — Telephone Encounter (Signed)
Patient called today stating she has had diarrhea on going since last spring and it is getting worse. She says her anxiety has been getting worse as of late also. She says she is now having to wear a diaper as she is having accidents and loperamide is not working. No upcoming appot scheduled.  ?

## 2021-08-29 ENCOUNTER — Encounter (INDEPENDENT_AMBULATORY_CARE_PROVIDER_SITE_OTHER): Payer: Self-pay | Admitting: Gastroenterology

## 2021-08-29 ENCOUNTER — Ambulatory Visit (INDEPENDENT_AMBULATORY_CARE_PROVIDER_SITE_OTHER): Payer: Medicare Other | Admitting: Gastroenterology

## 2021-08-29 VITALS — BP 139/78 | HR 58 | Temp 97.6°F | Ht 66.0 in | Wt 202.2 lb

## 2021-08-29 DIAGNOSIS — R197 Diarrhea, unspecified: Secondary | ICD-10-CM

## 2021-08-29 DIAGNOSIS — F54 Psychological and behavioral factors associated with disorders or diseases classified elsewhere: Secondary | ICD-10-CM

## 2021-08-29 DIAGNOSIS — R112 Nausea with vomiting, unspecified: Secondary | ICD-10-CM | POA: Diagnosis not present

## 2021-08-29 NOTE — Patient Instructions (Signed)
Please continue promethazine every 8 hours as needed for nausea ?You can try the lomotil that Dr. Sherrie Sport provided you for diarrhea ?Please continue to follow closely with your psychiatrist as I suspect that your GI symptoms are heavily influenced by the decrease in your benzos and your worsening anxiety/depression ?As we discussed EMDR therapy may be something that you could benefit from, I will write down the name of the counselor I am familiar with who does this ?

## 2021-08-29 NOTE — Progress Notes (Signed)
? ?Referring Provider: Neale Burly, MD ?Primary Care Physician:  Neale Burly, MD ?Primary GI Physician: Jenetta Downer ? ?Chief Complaint  ?Patient presents with  ? Diarrhea  ?  Patient here today due to diarrhea and vomiting ongoing for the last year.   ? ?HPI:   ?Dawn Kelley is a 61 y.o. female with past medical history of refractory severe anxiety/depression (hx of ECT therapy), bipolar disorder, PTSD, fibromyalgia, gerd and IBS-M  ? ?Patient presenting today for diarrhea and vomiting x1 year. ? ?Last seen 06/26/21 for decreased appetite, loose stools, nausea and dry heaving with occasional vomiting. At that time she was concerned about her mental health and had been seeing a new Psychiatrist, having a lot of anxiety at that time. Recently started on protonix '40mg'$  daily with some improvement in abdominal pain since then, unsure if she was taking famotidine. She was advised to continue PPI, follow closely with Psychiatrist and discuss possibility of starting Librax with them. ? ?Notably, patient reached out to our office a few times in march with c/o diarrhea x6+months, concerned that protonix could be causing this, she was advised to take Famotidine BID and stop PPI. She called a few weeks after with ongoing nausea, was advised to continue zofran but phenergan sent for continued breakthrough nausea. She reached out on 4/6 with c/o n/v/d x1 year, taking promethazine and zofran PRN, advised to take imodium, up to 6 tablets per day PRN. Contacted the office on 4/17 with worsening diarrhea, stating loperamide was not working and felt that anxiety was becoming much worse, advised to take pepto-bismol Q6H and set up OV for further evaluation.  ? ?Today, patient states that for the past year she has had nausea and dry heaving. She also reports that she has been having diarrhea for the past year off and on. She is having to wear depends now. She states that diarrhea does not happen everyday but when it does occur,  she has fecal incontinence. States stools can be watery to mushy in consistency. She denies any rectal bleeding or melena. She denies any abdominal pain, rectal bleeding or melena. States sometimes vomiting makes her feel better, but not always. She reports that appetite is not good, she has to force herself to eat. Weight is stable. She is taking promethazine which seems to help with her nausea. She was given lomotil 4/16 by her PCP but has not taken this yet. She was also given promethazine by her PCP on 4/16, though she did not realize that the promethazine prescribed by [place] and PCP were the same medication until I pointed this out. She reports that some of her anxiety/depression medications have been changed recently. She saw Psychiatrist via telehealth last week. She has another appt with him today to further discuss the plan for her. States that psychiatrist does not want to give her anymore benzodiazepines, they have cut her dose down, starting 6-7 months ago, attempting to wean her off, however, she had some withdrawal symptoms and had to go back up some on her dose. She does endorse a lot of depression and ultimately just does not feel like eating. She states that the ECT therapy she had previously had helped and she felt much better after her 9th session, her and her husband wanted to discontinue the treatments but were pushed to continue with the remaining sessions and felt that she was right back where she started mentally, after completing these.  ? ?Last EGD: 02/03/2020 - showed multiple small gastric  polyps which were sessile fundic gland polyp.  Small bowel was biopsied, histology was normal.   ?Colonoscopy:02/03/20 3 mm polyp in the sigmoid colon that was removed with a cold snare, as well as hemorrhoids, histology was consistent with a tubular adenoma ? ?Recommendations:  ?Repeat colonoscopy in sept 2028 ? ?Past Medical History:  ?Diagnosis Date  ? Anxiety   ? Bipolar 1 disorder ([place])   ? Depression    ? OCD (obsessive compulsive disorder)   ? PTSD (post-traumatic stress disorder)   ? S/P ECT (electroconvulsive therapy)   ? ? ?Past Surgical History:  ?Procedure Laterality Date  ? BIOPSY  02/03/2020  ? Procedure: BIOPSY;  Surgeon: Montez Morita, Quillian Quince, MD;  Location: [facility];  Service: Gastroenterology;;  ? COLONOSCOPY WITH PROPOFOL N/A 02/03/2020  ? [name]: 64m polyp in sigmoid colon  as well as hemorrhoids, histology consistent with tubular adenoma  ? ESOPHAGOGASTRODUODENOSCOPY (EGD) WITH PROPOFOL N/A 02/03/2020  ? castaneda: small gastric polyps, sessile, and fundic gland, small bowel bx normal  ? INDUCED ABORTION N/A 1980  ? POLYPECTOMY  02/03/2020  ? Procedure: POLYPECTOMY;  Surgeon: CHarvel Quale MD;  Location: [facility];  Service: Gastroenterology;;  ? VEIN SURGERY    ? ? ?Current Outpatient Medications  ?Medication Sig Dispense Refill  ? acetaminophen (TYLENOL) 325 MG tablet Take 325 mg by mouth every 6 (six) hours as needed. 975 mg  per day for one week per patient.    ? clonazePAM (KLONOPIN) 0.5 MG tablet Take 0.5 mg by mouth in the morning, at noon, and at bedtime.    ? diphenoxylate-atropine (LOMOTIL) 2.5-0.025 MG tablet Take 1 tablet by mouth in the morning and at bedtime. Prn.    ? divalproex (DEPAKOTE ER) 250 MG 24 hr tablet Take 250 mg by mouth in the morning and at bedtime.    ? famotidine (PEPCID) 40 MG tablet Take 1 tablet (40 mg total) by mouth daily. 90 tablet 3  ? hydrOXYzine (ATARAX) 25 MG tablet Take 25 mg by mouth 3 (three) times daily.    ? levothyroxine (SYNTHROID) 25 MCG tablet Take 25 mcg by mouth daily before breakfast.    ? loperamide (IMODIUM) 2 MG capsule Take 2 mg by mouth as needed for diarrhea or loose stools.    ? losartan (COZAAR) 25 MG tablet Take 25 mg by mouth daily.    ? OLANZapine (ZYPREXA) 5 MG tablet Take 5 mg by mouth at bedtime.    ? omeprazole (PRILOSEC) 20 MG capsule Take 20 mg by mouth daily.    ? OVER THE COUNTER MEDICATION Probiotic  1 po Qd per patient.    ? OVER THE COUNTER MEDICATION Vit D 2 1.2/50,000 IU once per week.    ? promethazine (PHENERGAN) 12.5 MG tablet Take 1 tablet (12.5 mg total) by mouth every 8 (eight) hours as needed for nausea or vomiting. 30 tablet 0  ? propranolol (INDERAL) 20 MG tablet Take 20 mg by mouth 3 (three) times daily. '10mg'$  one tid    ? rosuvastatin (CRESTOR) 20 MG tablet Take 20 mg by mouth daily.    ? Vilazodone HCl 20 MG TABS Take 20 mg by mouth daily.    ? ondansetron (ZOFRAN) 4 MG tablet Take 1 tablet (4 mg total) by mouth every 8 (eight) hours as needed for nausea or vomiting. (Patient not taking: Reported on 08/29/2021) 30 tablet 2  ? OVER THE COUNTER MEDICATION B 12 one po QD per patient . (Patient not taking: Reported  on 08/29/2021)    ? ?No current facility-administered medications for this visit.  ? ? ?Allergies as of 08/29/2021 - Review Complete 08/29/2021  ?Allergen Reaction Noted  ? Escitalopram Nausea And Vomiting 12/28/2019  ? Seroquel [quetiapine fumarate] Shortness Of Breath 09/26/2019  ? Trazodone Other (See Comments) 02/15/2020  ? ? ?Family History  ?Problem Relation Age of Onset  ? Depression Mother   ? Pneumonia Mother   ? ? ?Social History  ? ?Socioeconomic History  ? Marital status: Married  ?  Spouse name: Not on file  ? Number of children: Not on file  ? Years of education: Not on file  ? Highest education level: Not on file  ?Occupational History  ? Occupation: Disabled  ?Tobacco Use  ? Smoking status: Never  ? Smokeless tobacco: Never  ?Vaping Use  ? Vaping Use: Never used  ?Substance and Sexual Activity  ? Alcohol use: Yes  ?  Comment: occasionally  ? Drug use: Yes  ?  Types: Marijuana  ?  Comment: pt's husband reports she uses it to help with anxiety and depression  ? Sexual activity: Not Currently  ?  Birth control/protection: Post-menopausal  ?Other Topics Concern  ? Not on file  ?Social History Narrative  ? Pt lives in [place] with husband.  She is on disability.  Pt stated that  she receives outpatient psychiatry services through [facility].  She does not receive outpatient therapy.  ? ?Social Determinants of Health  ? ?Financial Resource Strain: Low Risk   ? Difficult

## 2021-08-31 ENCOUNTER — Telehealth (INDEPENDENT_AMBULATORY_CARE_PROVIDER_SITE_OTHER): Payer: Self-pay

## 2021-08-31 NOTE — Telephone Encounter (Signed)
Patient called back today stating she is still having vomiting and diarrhea and the pepto and Promethazine nor the ondansetron are helping. Please advise.  ?

## 2021-09-04 ENCOUNTER — Other Ambulatory Visit (INDEPENDENT_AMBULATORY_CARE_PROVIDER_SITE_OTHER): Payer: Self-pay | Admitting: Gastroenterology

## 2021-09-04 ENCOUNTER — Telehealth (INDEPENDENT_AMBULATORY_CARE_PROVIDER_SITE_OTHER): Payer: Self-pay | Admitting: *Deleted

## 2021-09-04 DIAGNOSIS — K219 Gastro-esophageal reflux disease without esophagitis: Secondary | ICD-10-CM

## 2021-09-04 MED ORDER — ESOMEPRAZOLE MAGNESIUM 40 MG PO CPDR: 40.0000 mg | DELAYED_RELEASE_CAPSULE | Freq: Every day | ORAL | 11 refills | Status: DC

## 2021-09-04 NOTE — Telephone Encounter (Signed)
Esomeprazole sent to pharmacy, she needs to continue taking the Lomotil for diarrhea ?

## 2021-09-04 NOTE — Telephone Encounter (Signed)
Patient is taking prn,but also had some compazine at home given by Dr. Zada Girt. I advised per Dr. Jenetta Downer to continue the lomotil prn. He has also sent in her ppi to the drug store she is aware of all. ?

## 2021-09-04 NOTE — Telephone Encounter (Signed)
Patient states she does not know what to do, she is still having diarrhea and nausea and wants her esomeprazole called into [facility] as the omeprazole is not working. Please advise. ?

## 2021-09-04 NOTE — Telephone Encounter (Signed)
Patient left 2 message asking for acid reflux meds, states OTC med not working ?

## 2021-09-05 DIAGNOSIS — K219 Gastro-esophageal reflux disease without esophagitis: Secondary | ICD-10-CM | POA: Diagnosis not present

## 2021-09-12 ENCOUNTER — Other Ambulatory Visit: Payer: Self-pay

## 2021-09-12 ENCOUNTER — Emergency Department (HOSPITAL_COMMUNITY): Payer: Medicare Other

## 2021-09-12 ENCOUNTER — Emergency Department (HOSPITAL_COMMUNITY)
Admission: EM | Admit: 2021-09-12 | Discharge: 2021-09-12 | Disposition: A | Discharge status: Home / Self Care | Payer: Medicare Other | Attending: Emergency Medicine | Admitting: Emergency Medicine

## 2021-09-12 DIAGNOSIS — R109 Unspecified abdominal pain: Secondary | ICD-10-CM | POA: Diagnosis not present

## 2021-09-12 DIAGNOSIS — R110 Nausea: Secondary | ICD-10-CM | POA: Diagnosis not present

## 2021-09-12 DIAGNOSIS — R140 Abdominal distension (gaseous): Secondary | ICD-10-CM | POA: Diagnosis not present

## 2021-09-12 DIAGNOSIS — R1013 Epigastric pain: Secondary | ICD-10-CM | POA: Insufficient documentation

## 2021-09-12 DIAGNOSIS — F319 Bipolar disorder, unspecified: Secondary | ICD-10-CM | POA: Insufficient documentation

## 2021-09-12 DIAGNOSIS — I700 Atherosclerosis of aorta: Secondary | ICD-10-CM | POA: Diagnosis not present

## 2021-09-12 DIAGNOSIS — R1010 Upper abdominal pain, unspecified: Secondary | ICD-10-CM

## 2021-09-12 DIAGNOSIS — E860 Dehydration: Secondary | ICD-10-CM | POA: Diagnosis not present

## 2021-09-12 DIAGNOSIS — R1110 Vomiting, unspecified: Secondary | ICD-10-CM | POA: Diagnosis not present

## 2021-09-12 LAB — COMPREHENSIVE METABOLIC PANEL
ALT: 18 U/L (ref 0–44)
AST: 23 U/L (ref 15–41)
Albumin: 4.2 g/dL (ref 3.5–5.0)
Alkaline Phosphatase: 69 U/L (ref 38–126)
Anion gap: 11 (ref 5–15)
BUN: 9 mg/dL (ref 8–23)
CO2: 24 mmol/L (ref 22–32)
Calcium: 9.2 mg/dL (ref 8.9–10.3)
Chloride: 105 mmol/L (ref 98–111)
Creatinine, Ser: 0.67 mg/dL (ref 0.44–1.00)
GFR, Estimated: >60 mL/min (ref >60)
Glucose, Bld: 130 mg/dL — ABNORMAL HIGH (ref 70–99)
Potassium: 3.7 mmol/L (ref 3.5–5.1)
Sodium: 140 mmol/L (ref 135–145)
Total Bilirubin: 0.7 mg/dL (ref 0.3–1.2)
Total Protein: 7.8 g/dL (ref 6.5–8.1)

## 2021-09-12 LAB — CBC WITH DIFFERENTIAL/PLATELET
Abs Immature Granulocytes: 0.03 10*3/uL (ref 0.00–0.07)
Basophils Absolute: 0 10*3/uL (ref 0.0–0.1)
Basophils Relative: 0 %
Eosinophils Absolute: 0 10*3/uL (ref 0.0–0.5)
Eosinophils Relative: 0 %
HCT: 40.9 % (ref 36.0–46.0)
Hemoglobin: 13.8 g/dL (ref 12.0–15.0)
Immature Granulocytes: 0 %
Lymphocytes Relative: 21 %
Lymphs Abs: 2.1 10*3/uL (ref 0.7–4.0)
MCH: 30.4 pg (ref 26.0–34.0)
MCHC: 33.7 g/dL (ref 30.0–36.0)
MCV: 90.1 fL (ref 80.0–100.0)
Monocytes Absolute: 0.5 10*3/uL (ref 0.1–1.0)
Monocytes Relative: 5 %
Neutro Abs: 7.1 10*3/uL (ref 1.7–7.7)
Neutrophils Relative %: 74 %
Platelets: 259 10*3/uL (ref 150–400)
RBC: 4.54 MIL/uL (ref 3.87–5.11)
RDW: 13.1 % (ref 11.5–15.5)
WBC: 9.8 10*3/uL (ref 4.0–10.5)
nRBC: 0 % (ref 0.0–0.2)

## 2021-09-12 LAB — LIPASE, BLOOD: Lipase: 31 U/L (ref 11–51)

## 2021-09-12 MED ORDER — ONDANSETRON HCL 4 MG/2ML IJ SOLN
4.0000 mg | Freq: Once | INTRAMUSCULAR | Status: AC
  Administered 2021-09-12: 4 mg via INTRAVENOUS
  Filled 2021-09-12: qty 2

## 2021-09-12 MED ORDER — SODIUM CHLORIDE 0.9 % IV BOLUS
1000.0000 mL | Freq: Once | INTRAVENOUS | Status: AC
  Administered 2021-09-12: 1000 mL via INTRAVENOUS

## 2021-09-12 MED ORDER — IOHEXOL 300 MG/ML  SOLN
100.0000 mL | Freq: Once | INTRAMUSCULAR | Status: AC | PRN
  Administered 2021-09-12: 100 mL via INTRAVENOUS

## 2021-09-12 NOTE — ED Notes (Signed)
Pt c/o dry heaving.  Sts she wakes up every morning around 0400 with dry heaving.  She feels it's from the medications she takes prior to bed. ?

## 2021-09-12 NOTE — ED Triage Notes (Signed)
Pt to the ED with complaints of abdominal pain N/V/D and this for the past month. ? ?The pt states she has upper medial abdominal pain. ? ? ? ? ?

## 2021-09-12 NOTE — ED Notes (Signed)
Pt returned from CT and got fully dressed.  Pt had been c/o being cold prior to CT and was told she could partially redress after scan if everything came back ok.  Pt educated she may need to undress again if something abnormal showed on the scan.  ?

## 2021-09-12 NOTE — ED Notes (Signed)
Pt c/o n/v/d and upper abdominal pain x1 month.  Pt reports changes in psych medications x1 month ago.  Pt sts she is on a significant amount of psych medications and thinks they are causing the issues.  Pt reports she has tried to speak to psychiatrist about issue but he wont adjust medications.   ?

## 2021-09-12 NOTE — ED Notes (Signed)
Pt provided a small cup of ice chips.  

## 2021-09-12 NOTE — Discharge Instructions (Signed)
Increase your Nexium to twice a day if you are having upper abdominal discomfort.  Also call your doctor and make sure they know that you are stopping the Depakote for nausea.  Follow-up with your gastroenterologist and your family doctor ?

## 2021-09-12 NOTE — ED Provider Notes (Signed)
?[facility] ?Provider Note ? ? ?CSN: [id number] ?Arrival date & time: 09/12/21  0801 ? ?  ? ?History ? ?Chief Complaint  ?Patient presents with  ? Abdominal Pain  ? ? ?Dawn Kelley is a 61 y.o. female. ? ?Patient complains of epigastric discomfort.  She has been having these problems for months and is followed by GI Dr.  She has had nausea that she thinks is from the Depakote.  She has stopped the Depakote.  Patient has a history of bipolar disease and abdominal pain and nausea ? ?The history is provided by the patient and medical records. No language interpreter was used.  ?Abdominal Pain ?Pain location:  Epigastric ?Pain quality: aching   ?Pain radiates to:  Does not radiate ?Pain severity:  Moderate ?Onset quality:  Sudden ?Timing:  Constant ?Progression:  Waxing and waning ?Chronicity:  Recurrent ?Context: not alcohol use   ?Relieved by:  Nothing ?Worsened by:  Nothing ?Associated symptoms: nausea   ?Associated symptoms: no chest pain, no cough, no diarrhea, no fatigue and no hematuria   ? ?  ? ?Home Medications ?Prior to Admission medications   ?Medication Sig Start Date End Date Taking? Authorizing Provider  ?acetaminophen (TYLENOL) 325 MG tablet Take 325 mg by mouth every 6 (six) hours as needed. 975 mg  per day for one week per patient.    [provider]  ?clonazePAM (KLONOPIN) 0.5 MG tablet Take 0.5 mg by mouth in the morning, at noon, and at bedtime. 03/29/20   [provider]  ?diphenoxylate-atropine (LOMOTIL) 2.5-0.025 MG tablet Take 1 tablet by mouth in the morning and at bedtime. Prn.    [provider]  ?divalproex (DEPAKOTE ER) 250 MG 24 hr tablet Take 250 mg by mouth in the morning and at bedtime.    [provider]  ?esomeprazole (NEXIUM) 40 MG capsule Take 1 capsule (40 mg total) by mouth daily before breakfast. 09/04/21   Montez Morita, Quillian Quince, MD  ?famotidine (PEPCID) 40 MG tablet Take 1 tablet (40 mg total) by mouth daily. 02/20/21    Carlan, Deatra Robinson, NP  ?hydrOXYzine (ATARAX) 25 MG tablet Take 25 mg by mouth 3 (three) times daily.    [provider]  ?levothyroxine (SYNTHROID) 25 MCG tablet Take 25 mcg by mouth daily before breakfast.    [provider]  ?loperamide (IMODIUM) 2 MG capsule Take 2 mg by mouth as needed for diarrhea or loose stools.    [provider]  ?losartan (COZAAR) 25 MG tablet Take 25 mg by mouth daily.    [provider]  ?OLANZapine (ZYPREXA) 5 MG tablet Take 5 mg by mouth at bedtime.    [provider]  ?ondansetron (ZOFRAN) 4 MG tablet Take 1 tablet (4 mg total) by mouth every 8 (eight) hours as needed for nausea or vomiting. ?Patient not taking: Reported on 08/29/2021 08/02/21   Harvel Quale, MD  ?OVER THE COUNTER MEDICATION Probiotic 1 po Qd per patient.    [provider]  ?OVER THE COUNTER MEDICATION B 12 one po QD per patient . ?Patient not taking: Reported on 08/29/2021    [provider]  ?OVER THE COUNTER MEDICATION Vit D 2 1.2/50,000 IU once per week.    [provider]  ?promethazine (PHENERGAN) 12.5 MG tablet Take 1 tablet (12.5 mg total) by mouth every 8 (eight) hours as needed for nausea or vomiting. 08/02/21   Montez Morita, Quillian Quince, MD  ?propranolol (INDERAL) 20 MG tablet Take 20 mg  by mouth 3 (three) times daily. '10mg'$  one tid 03/29/20   [provider]  ?rosuvastatin (CRESTOR) 20 MG tablet Take 20 mg by mouth daily.    [provider]  ?Vilazodone HCl 20 MG TABS Take 20 mg by mouth daily.    [provider]  ?   ? ?Allergies    ?Escitalopram, Seroquel [quetiapine fumarate], and Trazodone   ? ?Review of Systems   ?Review of Systems  ?Constitutional:  Negative for appetite change and fatigue.  ?HENT:  Negative for congestion, ear discharge and sinus pressure.   ?Eyes:  Negative for discharge.  ?Respiratory:  Negative for cough.   ?Cardiovascular:  Negative for chest pain.  ?Gastrointestinal:   Positive for abdominal pain and nausea. Negative for diarrhea.  ?Genitourinary:  Negative for frequency and hematuria.  ?Musculoskeletal:  Negative for back pain.  ?Skin:  Negative for rash.  ?Neurological:  Negative for seizures and headaches.  ?Psychiatric/Behavioral:  Negative for hallucinations.   ? ?Physical Exam ?Updated Vital Signs ?BP 108/72 (BP Location: Right Arm)   Pulse 68   Temp 97.9 ?F (36.6 ?C) (Oral)   Resp 18   Ht '5\' 6"'$  (1.676 m)   Wt 91.6 kg   SpO2 95%   BMI 32.60 kg/m?  ?Physical Exam ?Vitals and nursing note reviewed.  ?Constitutional:   ?   Appearance: She is well-developed.  ?HENT:  ?   Head: Normocephalic.  ?   Nose: Nose normal.  ?Eyes:  ?   General: No scleral icterus. ?   Conjunctiva/sclera: Conjunctivae normal.  ?Neck:  ?   Thyroid: No thyromegaly.  ?Cardiovascular:  ?   Rate and Rhythm: Normal rate and regular rhythm.  ?   Heart sounds: No murmur heard. ?  No friction rub. No gallop.  ?Pulmonary:  ?   Breath sounds: No stridor. No wheezing or rales.  ?Chest:  ?   Chest wall: No tenderness.  ?Abdominal:  ?   General: There is distension.  ?   Tenderness: There is no abdominal tenderness. There is no rebound.  ?Musculoskeletal:     ?   General: Normal range of motion.  ?   Cervical back: Neck supple.  ?Lymphadenopathy:  ?   Cervical: No cervical adenopathy.  ?Skin: ?   Findings: No erythema or rash.  ?Neurological:  ?   Mental Status: She is alert and oriented to person, place, and time.  ?   Motor: No abnormal muscle tone.  ?   Coordination: Coordination normal.  ?Psychiatric:     ?   Behavior: Behavior normal.  ? ? ?ED Results / Procedures / Treatments   ?Labs ?(all labs ordered are listed, but only abnormal results are displayed) ?Labs Reviewed  ?COMPREHENSIVE METABOLIC PANEL - Abnormal; Notable for the following components:  ?    Result Value  ? Glucose, Bld 130 (*)   ? All other components within normal limits  ?CBC WITH DIFFERENTIAL/PLATELET  ?LIPASE, BLOOD   ? ? ?EKG ?None ? ?Radiology ?CT ABDOMEN PELVIS W CONTRAST ? ?Result Date: 09/12/2021 ?CLINICAL DATA:  Acute abdominal pain. Nausea and vomiting with diarrhea for 1 month. EXAM: CT ABDOMEN AND PELVIS WITH CONTRAST TECHNIQUE: Multidetector CT imaging of the abdomen and pelvis was performed using the standard protocol following bolus administration of intravenous contrast. RADIATION DOSE REDUCTION: This exam was performed according to the departmental dose-optimization program which includes automated exposure control, adjustment of the mA and/or kV according to patient size and/or use of iterative reconstruction technique.  CONTRAST:  184m OMNIPAQUE IOHEXOL 300 MG/ML  SOLN COMPARISON:  CT [facility] 01/12/2019 FINDINGS: Lower chest: No acute abnormality. Hepatobiliary: No suspicious liver abnormality. Lesion within posterior dome of liver with peripheral discontiguous enhancement measures 1.7 cm. Unchanged from previous exam likely representing a benign liver hemangioma. No suspicious liver lesion identified. Gallbladder appears normal. No bile duct dilatation. Pancreas: Unremarkable. No pancreatic ductal dilatation or surrounding inflammatory changes. Spleen: Normal in size without focal abnormality. Adrenals/Urinary Tract: Adrenal glands are unremarkable. Kidneys are normal, without renal calculi, focal lesion, or hydronephrosis. Bladder is unremarkable. Stomach/Bowel: The stomach appears normal. The appendix is visualized and is within normal limits. No small bowel wall thickening, inflammation, or distension. The colon is decompressed. Mild wall thickening of the colon is identified without surrounding inflammatory fat stranding. Vascular/Lymphatic: Aortic atherosclerosis. No abdominopelvic adenopathy. Reproductive: Uterus and bilateral adnexa are unremarkable. Other: There is no free fluid or fluid collections. No signs of pneumoperitoneum. Musculoskeletal: No acute or significant osseous findings. IMPRESSION: 1. No acute  findings. 2. Colon appears diffusely decompressed. Mild wall thickening of the colon likely reflects incomplete distension. Correlate for any clinical signs or symptoms of colitis. 3. Lesion within posterior dome of liver with pe

## 2021-09-12 NOTE — ED Notes (Signed)
Pt upset we will not given her anything to eat or drink.  Pt educated by this Probation officer that she has been reporting n/v and we cannot safety give her anything to eat at this time. ? ?Additionally, Pt upset she has not received an update from the EDP.  EDP made aware.  ? ? ?

## 2021-09-12 NOTE — ED Notes (Signed)
Pt provided an update per EDP.  Given lab and CT results. ? ?Pt given crackers, peanut butter, and Ginger ale per request.  ?

## 2021-09-14 ENCOUNTER — Telehealth (INDEPENDENT_AMBULATORY_CARE_PROVIDER_SITE_OTHER): Payer: Self-pay

## 2021-09-14 ENCOUNTER — Telehealth (INDEPENDENT_AMBULATORY_CARE_PROVIDER_SITE_OTHER): Payer: Self-pay | Admitting: *Deleted

## 2021-09-14 NOTE — Telephone Encounter (Signed)
Patient called again today says she went ot the Ed yesterday due to abdominal pain. She doesn't know what to do. She is still having nausea,vomiting and diarrhea with in minutes of eating. Please advise.  ?

## 2021-09-14 NOTE — Telephone Encounter (Signed)
Patient left message - needs to talk to someone ? ?[phone] ?

## 2021-09-15 NOTE — Telephone Encounter (Signed)
Patient called again this am 09/15/2021, and she states that she eats food goes in and then straight back out . She does not know what else to do. I tried calling her back no answer.  ?

## 2021-09-15 NOTE — Telephone Encounter (Signed)
Spoke with the patient and she states she had went to the Ed recently and they sent her home with zofran. She says she has had diarrhea for over a month and she has diarrhea with in 5 minutes of eating. She does not know what to do. She thinks related to IBS. Please advise.  ?

## 2021-09-16 ENCOUNTER — Other Ambulatory Visit: Payer: Self-pay

## 2021-09-16 ENCOUNTER — Emergency Department (HOSPITAL_COMMUNITY)
Admission: EM | Admit: 2021-09-16 | Discharge: 2021-09-16 | Disposition: A | Discharge status: Home / Self Care | Payer: Medicare Other | Attending: Emergency Medicine | Admitting: Emergency Medicine

## 2021-09-16 ENCOUNTER — Encounter (HOSPITAL_COMMUNITY): Payer: Self-pay

## 2021-09-16 DIAGNOSIS — R112 Nausea with vomiting, unspecified: Secondary | ICD-10-CM | POA: Diagnosis not present

## 2021-09-16 DIAGNOSIS — R110 Nausea: Secondary | ICD-10-CM

## 2021-09-16 DIAGNOSIS — F419 Anxiety disorder, unspecified: Secondary | ICD-10-CM | POA: Diagnosis not present

## 2021-09-16 DIAGNOSIS — R1013 Epigastric pain: Secondary | ICD-10-CM | POA: Diagnosis not present

## 2021-09-16 DIAGNOSIS — R197 Diarrhea, unspecified: Secondary | ICD-10-CM | POA: Diagnosis not present

## 2021-09-16 LAB — COMPREHENSIVE METABOLIC PANEL
ALT: 23 U/L (ref 0–44)
AST: 21 U/L (ref 15–41)
Albumin: 4.1 g/dL (ref 3.5–5.0)
Alkaline Phosphatase: 63 U/L (ref 38–126)
Anion gap: 8 (ref 5–15)
BUN: 9 mg/dL (ref 8–23)
CO2: 25 mmol/L (ref 22–32)
Calcium: 9.1 mg/dL (ref 8.9–10.3)
Chloride: 107 mmol/L (ref 98–111)
Creatinine, Ser: 0.6 mg/dL (ref 0.44–1.00)
GFR, Estimated: >60 mL/min (ref >60)
Glucose, Bld: 125 mg/dL — ABNORMAL HIGH (ref 70–99)
Potassium: 3.9 mmol/L (ref 3.5–5.1)
Sodium: 140 mmol/L (ref 135–145)
Total Bilirubin: 0.2 mg/dL — ABNORMAL LOW (ref 0.3–1.2)
Total Protein: 7.6 g/dL (ref 6.5–8.1)

## 2021-09-16 LAB — CBC
HCT: 41.6 % (ref 36.0–46.0)
Hemoglobin: 13.5 g/dL (ref 12.0–15.0)
MCH: 29.7 pg (ref 26.0–34.0)
MCHC: 32.5 g/dL (ref 30.0–36.0)
MCV: 91.4 fL (ref 80.0–100.0)
Platelets: 277 10*3/uL (ref 150–400)
RBC: 4.55 MIL/uL (ref 3.87–5.11)
RDW: 12.9 % (ref 11.5–15.5)
WBC: 6.8 10*3/uL (ref 4.0–10.5)
nRBC: 0 % (ref 0.0–0.2)

## 2021-09-16 MED ORDER — LORAZEPAM 2 MG/ML IJ SOLN
1.0000 mg | Freq: Once | INTRAMUSCULAR | Status: AC
  Administered 2021-09-16: 1 mg via INTRAVENOUS
  Filled 2021-09-16: qty 1

## 2021-09-16 MED ORDER — SODIUM CHLORIDE 0.9 % IV BOLUS
1000.0000 mL | Freq: Once | INTRAVENOUS | Status: AC
  Administered 2021-09-16: 1000 mL via INTRAVENOUS

## 2021-09-16 NOTE — Discharge Instructions (Signed)
It was our pleasure to provide your ER care today - we hope that you feel better. ? ?Overall, your labs tests look good/normal.  ? ?Drink plenty of fluids/stay well hydrated.  ? ?Follow up closely with your primary care doctor this coming week - call office Monday for appointment. Also contact your GI doctor to see if they can move up your appointment, or see if they have cancellation.  ? ?For anxiety/stress - see attached info, take your meds as prescribed by your doctor.  ? ?Return to ER if worse, new symptoms, fevers, persistent vomiting, or other concern.  ?

## 2021-09-16 NOTE — ED Notes (Signed)
Patient given drink and crackers and is currently sitting on bedside commode. ?

## 2021-09-16 NOTE — ED Triage Notes (Signed)
Patient states that she has had diarrhea for 1 month with nausea and vomiting.  States that after trying to eat or drink anything she has to run to the bathroom within minutes.   ?

## 2021-09-16 NOTE — ED Provider Notes (Signed)
?[facility] ?Provider Note ? ? ?CSN: [id number] ?Arrival date & time: 09/16/21  1572 ? ?  ? ?History ? ?Chief Complaint  ?Patient presents with  ? Diarrhea  ? ? ?Dawn Kelley is a 61 y.o. female. ? ?Patient c/o diarrhea for past month and intermittent nausea. States has a few loose to watery bms per day. No bloody bms. No bloody emesis. No abd pain. No fever or chills. No specific known ill contacts, bad food ingestion or recent travel. No recent antibiotic use. No fever or chills. No change in home meds. States cant see gi doc until July. Was recently seen in ED for same - states no specific dx then.  ? ?The history is provided by the patient, a relative and medical records.  ?Diarrhea ?Associated symptoms: no abdominal pain, no chills, no fever and no headaches   ? ?  ? ?Home Medications ?Prior to Admission medications   ?Medication Sig Start Date End Date Taking? Authorizing Provider  ?acetaminophen (TYLENOL) 325 MG tablet Take 325 mg by mouth every 6 (six) hours as needed. 975 mg  per day for one week per patient.    [provider]  ?clonazePAM (KLONOPIN) 0.5 MG tablet Take 0.5 mg by mouth in the morning, at noon, and at bedtime. 03/29/20   [provider]  ?diphenoxylate-atropine (LOMOTIL) 2.5-0.025 MG tablet Take 1 tablet by mouth in the morning and at bedtime. Prn.    [provider]  ?divalproex (DEPAKOTE ER) 250 MG 24 hr tablet Take 250 mg by mouth in the morning and at bedtime.    [provider]  ?esomeprazole (NEXIUM) 40 MG capsule Take 1 capsule (40 mg total) by mouth daily before breakfast. 09/04/21   Montez Morita, Quillian Quince, MD  ?famotidine (PEPCID) 40 MG tablet Take 1 tablet (40 mg total) by mouth daily. 02/20/21   Carlan, Deatra Robinson, NP  ?hydrOXYzine (ATARAX) 25 MG tablet Take 25 mg by mouth 3 (three) times daily.    [provider]  ?levothyroxine (SYNTHROID) 25 MCG tablet Take 25 mcg by mouth daily before breakfast.    [provider]  ?loperamide (IMODIUM) 2 MG capsule Take 2 mg by mouth as needed for diarrhea or loose stools.    [provider]  ?losartan (COZAAR) 25 MG tablet Take 25 mg by mouth daily.    [provider]  ?OLANZapine (ZYPREXA) 5 MG tablet Take 5 mg by mouth at bedtime.    [provider]  ?ondansetron (ZOFRAN) 4 MG tablet Take 1 tablet (4 mg total) by mouth every 8 (eight) hours as needed for nausea or vomiting. ?Patient not taking: Reported on 08/29/2021 08/02/21   Harvel Quale, MD  ?OVER THE COUNTER MEDICATION Probiotic 1 po Qd per patient.    [provider]  ?OVER THE COUNTER MEDICATION B 12 one po QD per patient . ?Patient not taking: Reported on 08/29/2021    [provider]  ?OVER THE COUNTER MEDICATION Vit D 2 1.2/50,000 IU once per week.    [provider]  ?promethazine (PHENERGAN) 12.5 MG tablet Take 1 tablet (12.5 mg total) by mouth every 8 (eight) hours as needed for nausea or vomiting. 08/02/21   Montez Morita, Quillian Quince, MD  ?propranolol (INDERAL) 20 MG tablet Take 20 mg by mouth 3 (three) times daily. '10mg'$  one tid 03/29/20   [provider]  ?rosuvastatin (CRESTOR) 20 MG tablet Take 20 mg by mouth daily.    [provider]  ?Vilazodone  HCl 20 MG TABS Take 20 mg by mouth daily.    [provider]  ?   ? ?Allergies    ?Escitalopram, Seroquel [quetiapine fumarate], and Trazodone   ? ?Review of Systems   ?Review of Systems  ?Constitutional:  Negative for chills and fever.  ?HENT:  Negative for sore throat.   ?Eyes:  Negative for redness.  ?Respiratory:  Negative for cough and shortness of breath.   ?Cardiovascular:  Negative for chest pain.  ?Gastrointestinal:  Positive for diarrhea and nausea. Negative for abdominal pain.  ?Genitourinary:  Negative for dysuria and flank pain.  ?Musculoskeletal:  Negative for back pain and neck pain.  ?Skin:  Negative for rash.  ?Neurological:  Negative for headaches.   ?Hematological:  Does not bruise/bleed easily.  ?Psychiatric/Behavioral:  Negative for confusion. The patient is nervous/anxious.   ? ?Physical Exam ?Updated Vital Signs ?BP (!) 148/80 (BP Location: Right Arm)   Pulse 65   Temp 97.7 ?F (36.5 ?C) (Oral)   Resp 19   Ht 1.651 m ('5\' 5"'$ )   Wt 90.7 kg   SpO2 98%   BMI 33.28 kg/m?  ?Physical Exam ?Vitals and nursing note reviewed.  ?Constitutional:   ?   Appearance: Normal appearance. She is well-developed.  ?HENT:  ?   Head: Atraumatic.  ?   Nose: Nose normal.  ?   Mouth/Throat:  ?   Mouth: Mucous membranes are moist.  ?   Pharynx: Oropharynx is clear.  ?Eyes:  ?   General: No scleral icterus. ?   Conjunctiva/sclera: Conjunctivae normal.  ?Neck:  ?   Trachea: No tracheal deviation.  ?Cardiovascular:  ?   Rate and Rhythm: Normal rate and regular rhythm.  ?   Pulses: Normal pulses.  ?   Heart sounds: Normal heart sounds. No murmur heard. ?  No friction rub. No gallop.  ?Pulmonary:  ?   Effort: Pulmonary effort is normal. No respiratory distress.  ?   Breath sounds: Normal breath sounds.  ?Abdominal:  ?   General: Bowel sounds are normal. There is no distension.  ?   Palpations: Abdomen is soft. There is no mass.  ?   Tenderness: There is no abdominal tenderness. There is no guarding or rebound.  ?   Hernia: No hernia is present.  ?Genitourinary: ?   Comments: No cva tenderness.  ?Musculoskeletal:     ?   General: No swelling.  ?   Cervical back: Normal range of motion and neck supple. No rigidity. No muscular tenderness.  ?Skin: ?   General: Skin is warm and dry.  ?   Findings: No rash.  ?Neurological:  ?   Mental Status: She is alert.  ?   Comments: Alert, speech normal.   ?Psychiatric:  ?   Comments: Anxious appearing.   ? ? ?ED Results / Procedures / Treatments   ?Labs ?(all labs ordered are listed, but only abnormal results are displayed) ?Results for orders placed or performed during the hospital encounter of 09/16/21  ?CBC  ?Result Value Ref Range  ? WBC 6.8  4.0 - 10.5 K/uL  ? RBC 4.55 3.87 - 5.11 MIL/uL  ? Hemoglobin 13.5 12.0 - 15.0 g/dL  ? HCT 41.6 36.0 - 46.0 %  ? MCV 91.4 80.0 - 100.0 fL  ? MCH 29.7 26.0 - 34.0 pg  ? MCHC 32.5 30.0 - 36.0 g/dL  ? RDW 12.9 11.5 - 15.5 %  ? Platelets 277 150 - 400 K/uL  ? nRBC 0.0  0.0 - 0.2 %  ?Comprehensive metabolic panel  ?Result Value Ref Range  ? Sodium 140 135 - 145 mmol/L  ? Potassium 3.9 3.5 - 5.1 mmol/L  ? Chloride 107 98 - 111 mmol/L  ? CO2 25 22 - 32 mmol/L  ? Glucose, Bld 125 (H) 70 - 99 mg/dL  ? BUN 9 8 - 23 mg/dL  ? Creatinine, Ser 0.60 0.44 - 1.00 mg/dL  ? Calcium 9.1 8.9 - 10.3 mg/dL  ? Total Protein 7.6 6.5 - 8.1 g/dL  ? Albumin 4.1 3.5 - 5.0 g/dL  ? AST 21 15 - 41 U/L  ? ALT 23 0 - 44 U/L  ? Alkaline Phosphatase 63 38 - 126 U/L  ? Total Bilirubin 0.2 (L) 0.3 - 1.2 mg/dL  ? GFR, Estimated >60 >60 mL/min  ? Anion gap 8 5 - 15  ? ?CT ABDOMEN PELVIS W CONTRAST ? ?Result Date: 09/12/2021 ?CLINICAL DATA:  Acute abdominal pain. Nausea and vomiting with diarrhea for 1 month. EXAM: CT ABDOMEN AND PELVIS WITH CONTRAST TECHNIQUE: Multidetector CT imaging of the abdomen and pelvis was performed using the standard protocol following bolus administration of intravenous contrast. RADIATION DOSE REDUCTION: This exam was performed according to the departmental dose-optimization program which includes automated exposure control, adjustment of the mA and/or kV according to patient size and/or use of iterative reconstruction technique. CONTRAST:  142m OMNIPAQUE IOHEXOL 300 MG/ML  SOLN COMPARISON:  CT [facility] 01/12/2019 FINDINGS: Lower chest: No acute abnormality. Hepatobiliary: No suspicious liver abnormality. Lesion within posterior dome of liver with peripheral discontiguous enhancement measures 1.7 cm. Unchanged from previous exam likely representing a benign liver hemangioma. No suspicious liver lesion identified. Gallbladder appears normal. No bile duct dilatation. Pancreas: Unremarkable. No pancreatic ductal dilatation or surrounding  inflammatory changes. Spleen: Normal in size without focal abnormality. Adrenals/Urinary Tract: Adrenal glands are unremarkable. Kidneys are normal, without renal calculi, focal lesion, or hydronephrosis. Bladder is unre

## 2021-09-16 NOTE — Telephone Encounter (Signed)
Tried reaching the patient, patient in the ER at [facility]. If not admitted, please set her up for an appointment in clinic with me in next available (empty slots on Monday). ?Thanks ?

## 2021-09-18 ENCOUNTER — Ambulatory Visit (INDEPENDENT_AMBULATORY_CARE_PROVIDER_SITE_OTHER): Payer: Medicare Other | Admitting: Gastroenterology

## 2021-09-18 ENCOUNTER — Telehealth (INDEPENDENT_AMBULATORY_CARE_PROVIDER_SITE_OTHER): Payer: Self-pay

## 2021-09-18 ENCOUNTER — Encounter (INDEPENDENT_AMBULATORY_CARE_PROVIDER_SITE_OTHER): Payer: Self-pay | Admitting: Gastroenterology

## 2021-09-18 VITALS — BP 131/77 | HR 69 | Temp 98.3°F | Ht 65.0 in | Wt 201.3 lb

## 2021-09-18 DIAGNOSIS — K582 Mixed irritable bowel syndrome: Secondary | ICD-10-CM

## 2021-09-18 DIAGNOSIS — R1084 Generalized abdominal pain: Secondary | ICD-10-CM | POA: Diagnosis not present

## 2021-09-18 DIAGNOSIS — R112 Nausea with vomiting, unspecified: Secondary | ICD-10-CM | POA: Diagnosis not present

## 2021-09-18 MED ORDER — ONDANSETRON 4 MG PO TBDP: 4.0000 mg | ORAL_TABLET | Freq: Three times a day (TID) | ORAL | 2 refills | Status: DC | PRN

## 2021-09-18 MED ORDER — RIFAXIMIN 550 MG PO TABS
550.0000 mg | ORAL_TABLET | Freq: Three times a day (TID) | ORAL | 0 refills | Status: DC
Start: 2021-09-18 — End: 2021-11-20

## 2021-09-18 NOTE — Patient Instructions (Addendum)
Stop Compazine ?Start Zofran SL as needed for nausea ?Take Lomotil as needed if >3 episodes of diarrhea per day or if going out to eat ?Start Xifaxan for 2 weeks ?

## 2021-09-18 NOTE — Telephone Encounter (Signed)
I called left a message asked that the patient please return call to the office to get her scheduled here in the office today.  ?

## 2021-09-18 NOTE — Progress Notes (Signed)
Dawn Kelley, M.D. ?Gastroenterology & Hepatology ?[facility] ?[address] ?[place], [place] [address] ? ?Primary Care Physician: ?Dawn Burly, MD ?[address] ?[place] [address] ? ?I will communicate my assessment and recommendations to the referring MD via EMR. ? ?Problems: ?IBS-M ?Recurrent nausea vomiting ? ?History of Present Illness: ?Dawn Kelley is a 61 y.o. female with past medical history of refractory severe anxiety/depression (hx of ECT therapy), bipolar disorder, PTSD, fibromyalgia, gerd and IBS-M, who presents for follow up of nausea and vomiting. ? ?The patient was last seen on 08/29/2021. At that time, the patient was continued on famotidine twice daily and was advised to take Phenergan as needed for nausea.  She was also advised to take Lomotil previously prescribed by her PCP.  Finally, she was advised to follow closely with her psychiatrist. ? ?Patient reports that she has presented recurrent episodes of diarrhea. She is having 1-4 Bms per day, with presence of urgency frequently. States her stool consistency is [place] 6-7. She states sometimes she feels she needs to pass gas but only moves some mucus. She reports that she is afraid of going out to have food in  restaurant due to fear of fecal soiling. ? ?Patient reports that she is taking Lomotil twice a day, not as needed as she was confused about the right way to take the medicine. ? ?States that usually at 4-5 in AM she has episodes of dry heaving, which is happening almost on a daily basis. She has been taking Compazine without relief. Not taking any Zofran or Phenergan. Believes Zofran helped her better. ? ?The patient denies having any fever, chills, hematochezia, melena, hematemesis, jaundice, pruritus or weight loss -he is actually gaining weight recently even though she is not eating too much. ? ?Underwent CT abdomen   ?1. No acute findings. ?2. Colon appears diffusely  decompressed. Mild wall thickening of the ?colon likely reflects incomplete distension. Correlate for any ?clinical signs or symptoms of colitis. ?3. Lesion within posterior dome of liver with peripheral ?discontiguous enhancement measures 1.7 cm. This is unchanged from ?previous exam and most likely represents a benign hemangioma. ? ?Last EGD: 02/03/2020 - showed multiple small gastric polyps which were sessile fundic gland polyp.  Small bowel was biopsied, histology was normal.   ?Colonoscopy:02/03/20 3 mm polyp in the sigmoid colon that was removed with a cold snare, as well as hemorrhoids, histology was consistent with a tubular adenoma ? ?Past Medical History: ?Past Medical History:  ?Diagnosis Date  ? Anxiety   ? Bipolar 1 disorder ([place])   ? Depression   ? OCD (obsessive compulsive disorder)   ? PTSD (post-traumatic stress disorder)   ? S/P ECT (electroconvulsive therapy)   ? ? ?Past Surgical History: ?Past Surgical History:  ?Procedure Laterality Date  ? BIOPSY  02/03/2020  ? Procedure: BIOPSY;  Surgeon: Montez Morita, Quillian Quince, MD;  Location: [facility];  Service: Gastroenterology;;  ? COLONOSCOPY WITH PROPOFOL N/A 02/03/2020  ? Dawn Kelley: 51m polyp in sigmoid colon  as well as hemorrhoids, histology consistent with tubular adenoma  ? ESOPHAGOGASTRODUODENOSCOPY (EGD) WITH PROPOFOL N/A 02/03/2020  ? Dawn Kelley: small gastric polyps, sessile, and fundic gland, small bowel bx normal  ? INDUCED ABORTION N/A 1980  ? POLYPECTOMY  02/03/2020  ? Procedure: POLYPECTOMY;  Surgeon: CHarvel Quale MD;  Location: [facility];  Service: Gastroenterology;;  ? VEIN SURGERY    ? ? ?Family History: ?Family History  ?Problem Relation Age of  Onset  ? Depression Mother   ? Pneumonia Mother   ? ? ?Social History: ?Social History  ? ?Tobacco Use  ?Smoking Status Never  ?Smokeless Tobacco Never  ? ?Social History  ? ?Substance and Sexual Activity  ?Alcohol Use Yes  ? Comment: occasionally  ? ?Social History   ? ?Substance and Sexual Activity  ?Drug Use Yes  ? Types: Marijuana  ? Comment: pt's husband reports she uses it to help with anxiety and depression  ? ? ?Allergies: ?Allergies  ?Allergen Reactions  ? Escitalopram Nausea And Vomiting  ?  Tolerated citalopram with out difficulty for many years.  ? Seroquel [Quetiapine Fumarate] Shortness Of Breath  ? Trazodone Other (See Comments)  ?  Hives.  ? ? ?Medications: ?Current Outpatient Medications  ?Medication Sig Dispense Refill  ? acetaminophen (TYLENOL) 325 MG tablet Take 325 mg by mouth every 6 (six) hours as needed. 975 mg  per day for one week per patient.    ? bismuth subsalicylate (PEPTO BISMOL) 262 MG chewable tablet Chew 524 mg by mouth as needed.    ? clonazePAM (KLONOPIN) 0.5 MG tablet Take 0.5 mg by mouth in the morning, at noon, and at bedtime.    ? diphenoxylate-atropine (LOMOTIL) 2.5-0.025 MG tablet Take 1 tablet by mouth in the morning and at bedtime. Prn.    ? esomeprazole (NEXIUM) 40 MG capsule Take 1 capsule (40 mg total) by mouth daily before breakfast. 30 capsule 11  ? famotidine (PEPCID) 40 MG tablet Take 1 tablet (40 mg total) by mouth daily. 90 tablet 3  ? levothyroxine (SYNTHROID) 25 MCG tablet Take 25 mcg by mouth daily before breakfast.    ? loperamide (IMODIUM) 2 MG capsule Take 2 mg by mouth as needed for diarrhea or loose stools.    ? losartan (COZAAR) 25 MG tablet Take 25 mg by mouth daily.    ? OLANZapine (ZYPREXA) 5 MG tablet Take 5 mg by mouth at bedtime.    ? OVER THE COUNTER MEDICATION Probiotic 1 po Qd per patient.    ? OVER THE COUNTER MEDICATION B 12 one po QD per patient .    ? OVER THE COUNTER MEDICATION Vit D 2 1.2/50,000 IU once per week.    ? prochlorperazine (COMPAZINE) 5 MG tablet Take 5 mg by mouth every 6 (six) hours as needed for nausea or vomiting.    ? propranolol (INDERAL) 20 MG tablet Take 20 mg by mouth 3 (three) times daily. '10mg'$  one tid    ? Vilazodone HCl (VIIBRYD) 40 MG TABS Take 40 mg by mouth daily.    ?  divalproex (DEPAKOTE ER) 250 MG 24 hr tablet Take 250 mg by mouth in the morning and at bedtime. (Patient not taking: Reported on 09/18/2021)    ? ondansetron (ZOFRAN) 4 MG tablet Take 1 tablet (4 mg total) by mouth every 8 (eight) hours as needed for nausea or vomiting. (Patient not taking: Reported on 08/29/2021) 30 tablet 2  ? promethazine (PHENERGAN) 12.5 MG tablet Take 1 tablet (12.5 mg total) by mouth every 8 (eight) hours as needed for nausea or vomiting. (Patient not taking: Reported on 09/18/2021) 30 tablet 0  ? rosuvastatin (CRESTOR) 20 MG tablet Take 20 mg by mouth daily. (Patient not taking: Reported on 09/18/2021)    ? ?No current facility-administered medications for this visit.  ? ? ?Review of Systems: ?GENERAL: negative for malaise, night sweats ?HEENT: No changes in hearing or vision, no nose bleeds or other nasal problems. ?NECK:  Negative for lumps, goiter, pain and significant neck swelling ?RESPIRATORY: Negative for cough, wheezing ?CARDIOVASCULAR: Negative for chest pain, leg swelling, palpitations, orthopnea ?GI: SEE HPI ?MUSCULOSKELETAL: Negative for joint pain or swelling, back pain, and muscle pain. ?SKIN: Negative for lesions, rash ?PSYCH: Negative for sleep disturbance, mood disorder and recent psychosocial stressors. ?HEMATOLOGY Negative for prolonged bleeding, bruising easily, and swollen nodes. ?ENDOCRINE: Negative for cold or heat intolerance, polyuria, polydipsia and goiter. ?NEURO: negative for tremor, gait imbalance, syncope and seizures. ?The remainder of the review of systems is noncontributory. ? ? ?Physical Exam: ?BP 131/77 (BP Location: Left Arm, Patient Position: Sitting, Cuff Size: Large)   Pulse 69   Temp 98.3 ?F (36.8 ?C) (Oral)   Ht '5\' 5"'$  (1.651 m)   Wt 201 lb 4.8 oz (91.3 kg)   BMI 33.50 kg/m?  ?GENERAL: The patient is AO x3, in no acute distress. ?HEENT: Head is normocephalic and atraumatic. EOMI are intact. Mouth is well hydrated and without lesions. ?NECK: Supple. No  masses ?LUNGS: Clear to auscultation. No presence of rhonchi/wheezing/rales. Adequate chest expansion ?HEART: RRR, normal s1 and s2. ?ABDOMEN: tender to palpation in the R flank, no guarding, no peritoneal signs, and nondi

## 2021-09-18 NOTE — Telephone Encounter (Signed)
Approved per Summer with [facility] with a zero dollar co pay. ?[name] ?[address] ?[place], [place] [address] ? ?Hours of Operations: Address: ?5 a.m. - 10 p.m. PT, Monday-Friday [phone] ?6 a.m. - 3 p.m. PT, [place] [address] ? ?Date: 09/18/2021 ? ?To: Harvel Quale From: [employer] [facility] ?Phone: [phone] Phone: [phone] ?Fax: [phone] ? ?Reference #: [id number] ?RE: Prior Authorization Request ?Patient Name: Dawn Kelley Patient DOB: [birth year] ?Patient ID: [id number] Status of Request: Approve ?Medication Name: Xifaxan Tab '550mg'$  GPI/NDC: [id number] ? ?Decision Notes: ?XIFAXAN TAB '550MG'$ , use as directed, is approved through 10/02/2021 under your Medicare Part D ?benefit. Reviewed by: System ?If the treating physician would like to discuss this coverage decision with the physician or health care ?professional reviewer, please call [employer] [facility] Prior Authorization department at [phone]. ? ?

## 2021-09-18 NOTE — Telephone Encounter (Signed)
Patient called back and she took the 9:30 am appointment for today 09/18/2021. ?

## 2021-09-22 DIAGNOSIS — Z1211 Encounter for screening for malignant neoplasm of colon: Secondary | ICD-10-CM | POA: Diagnosis not present

## 2021-09-28 DIAGNOSIS — Z87891 Personal history of nicotine dependence: Secondary | ICD-10-CM | POA: Diagnosis not present

## 2021-09-28 DIAGNOSIS — K580 Irritable bowel syndrome with diarrhea: Secondary | ICD-10-CM | POA: Diagnosis not present

## 2021-09-28 DIAGNOSIS — Z9151 Personal history of suicidal behavior: Secondary | ICD-10-CM | POA: Diagnosis not present

## 2021-09-28 DIAGNOSIS — Z885 Allergy status to narcotic agent status: Secondary | ICD-10-CM | POA: Diagnosis not present

## 2021-09-28 DIAGNOSIS — Z20822 Contact with and (suspected) exposure to covid-19: Secondary | ICD-10-CM | POA: Diagnosis not present

## 2021-09-28 DIAGNOSIS — K589 Irritable bowel syndrome without diarrhea: Secondary | ICD-10-CM | POA: Diagnosis not present

## 2021-09-28 DIAGNOSIS — Z888 Allergy status to other drugs, medicaments and biological substances status: Secondary | ICD-10-CM | POA: Diagnosis not present

## 2021-09-28 DIAGNOSIS — Z79899 Other long term (current) drug therapy: Secondary | ICD-10-CM | POA: Diagnosis not present

## 2021-10-03 DIAGNOSIS — R197 Diarrhea, unspecified: Secondary | ICD-10-CM | POA: Diagnosis not present

## 2021-10-04 DIAGNOSIS — R197 Diarrhea, unspecified: Secondary | ICD-10-CM | POA: Diagnosis not present

## 2021-10-05 DIAGNOSIS — R197 Diarrhea, unspecified: Secondary | ICD-10-CM | POA: Diagnosis not present

## 2021-10-10 DIAGNOSIS — I4719 Other supraventricular tachycardia: Secondary | ICD-10-CM | POA: Insufficient documentation

## 2021-10-12 ENCOUNTER — Telehealth (INDEPENDENT_AMBULATORY_CARE_PROVIDER_SITE_OTHER): Payer: Self-pay

## 2021-10-12 NOTE — Telephone Encounter (Signed)
We don't retest for c. Diff but follow the improvement of symptoms. 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 If she persists with diarrhea after receiving the treatment, she will need to follow with [place] in the clinic.

## 2021-10-12 NOTE — Telephone Encounter (Signed)
Patient called states she was recently in the hospital at [facility] and the tested her stool and determined she had C diff. She says they gave her IV antibiotics there and she was sent home with Vancomycin 125 mg once per day for 14 days and her last day of treatment would be 10/25/2021. She would like to be retested after treatment. Please advise.

## 2021-10-13 NOTE — Telephone Encounter (Signed)
Patient states understanding.

## 2021-10-16 DIAGNOSIS — K5150 Left sided colitis without complications: Secondary | ICD-10-CM | POA: Diagnosis not present

## 2021-10-18 ENCOUNTER — Encounter (INDEPENDENT_AMBULATORY_CARE_PROVIDER_SITE_OTHER): Payer: Self-pay | Admitting: Gastroenterology

## 2021-10-19 ENCOUNTER — Telehealth (INDEPENDENT_AMBULATORY_CARE_PROVIDER_SITE_OTHER): Payer: Self-pay

## 2021-10-19 NOTE — Telephone Encounter (Signed)
Agree about vancomycin, she needs to finish all the course and call us to update [place] about symptoms once done with medication

## 2021-10-19 NOTE — Telephone Encounter (Signed)
Patient aware of all. She is aware she has an appointment here on 11/06/2021 at 11:45.

## 2021-10-19 NOTE — Telephone Encounter (Signed)
Patient called stating she is nervous and anxious and still having diarrhea. 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 Has no motivation to clean her home, her husband has had to hire someone to come in and clean. I advised that she will need to complete her treatment for the Cdiff, and possible speak with her psychiatrist in regards to her anxiousness.

## 2021-10-23 ENCOUNTER — Encounter (INDEPENDENT_AMBULATORY_CARE_PROVIDER_SITE_OTHER): Payer: Self-pay | Admitting: Gastroenterology

## 2021-10-23 ENCOUNTER — Telehealth (INDEPENDENT_AMBULATORY_CARE_PROVIDER_SITE_OTHER): Payer: Self-pay

## 2021-10-23 ENCOUNTER — Ambulatory Visit (INDEPENDENT_AMBULATORY_CARE_PROVIDER_SITE_OTHER): Payer: Medicare Other | Admitting: Gastroenterology

## 2021-10-23 VITALS — BP 102/67 | HR 71 | Temp 98.7°F | Ht 65.0 in | Wt 199.0 lb

## 2021-10-23 DIAGNOSIS — K582 Mixed irritable bowel syndrome: Secondary | ICD-10-CM

## 2021-10-23 MED ORDER — DICYCLOMINE HCL 10 MG PO CAPS: 10.0000 mg | ORAL_CAPSULE | Freq: Three times a day (TID) | ORAL | 1 refills | Status: DC

## 2021-10-23 NOTE — Patient Instructions (Addendum)
Please finish your course of vancomycin for C diff Call me after you have completed this to update me on how symptoms are doing If you continue to have frequent, watery stools, we may consider doing the Xifaxan that Dr. Jenetta Downer had recommended at your last visit. I will send a refill of dicyclomine as you felt that this helped some with your symptoms Try keeping a food journal x2-3 weeks to correlate symptoms and things you are eating, I have also provided the low FODMAP diet which has common triggers that can worsen diarrhea in patients with IBS  As discussed, I think that EMDR may be helpful in managing your PTSD/Anxiety, there are multiple providers who do this type of therapy in [place], I would encourage you to reach out to one of them.  Follow up 6 months

## 2021-10-23 NOTE — Progress Notes (Signed)
Referring Provider: Neale Burly, MD Primary Care Physician:  Neale Burly, MD Primary GI Physician: Jenetta Downer  Chief Complaint  Patient presents with   Diarrhea    Diarrhea for 3 months. States she has c diff and finsihed dicyclomine and taking vancomycin.    HPI:   Dawn Kelley is a 61 y.o. female with past medical history of  refractory severe anxiety/depression (hx of ECT therapy), bipolar disorder, PTSD, fibromyalgia, gerd and IBS-M,  Patient presenting today for follow up.  History: Last seen 09/18/21, at that time, she presented with recurrent episodes of diarrhea. having 1-4 Bms per day, with presence of urgency frequently. Reported consistency of stools was [place] 6-7. She states sometimes she feels she needs to pass gas but only moves some mucus.  Taking Lomotil twice a day at that time, not as needed as she was confused about the right way to take the medicine. Also with reports that around 4-5 in AM she has episodes of dry heaving, happening almost on a daily basis. She has been taking Compazine without relief. Not taking any Zofran or Phenergan. Believes Zofran helped her better. Advised to stop compazine, start zofran PRN, take lomotil PRN >3 episodes of diarrhea or if going out to eat, also started on xifaxan x2 weeks.  Notably patient continued with diarrhea, had stool studies done on 5/24 at [facility] [address] with presence of Clostridium Difficile, being treated with Vancomycin '125mg'$  QID, started on 5/31,set to finish on 6/14  Present:  Today patient states that she continues to have 2-4 BMs per day. Stools are watery. She has 2 days left of her vancomycin, has not noted any improvement on this. She was also given dicyclomine at [facility] and she completed entire bottle of this. She is noticing that she has frequent post prandial stooling. She ran out of her lomotil. She does not think that she took the Xifaxan that was sent by Dr. Jenetta Downer. They do not recall ever picking this one  up. She denies abdominal pain.   Nausea and vomiting are improved, she is not taking anything for this currently. She feels that appetite is poor, she feels that she has to avoid certain foods as they cause worsening diarrhea. Husband thinks that dyclocmine may have been helping her symptoms some. She states that she is seeing psychiatry, she is currently on clonazepam 0.'5mg'$  TID, she states that they will not give her any stronger benzos or up her dose any. She continues to have a lot of issues with her anxiety. She is very anxious during her visit.   CT abdomen  09/12/21 1. No acute findings. 2. Colon appears diffusely decompressed. Mild wall thickening of the colon likely reflects incomplete distension. Correlate for any clinical signs or symptoms of colitis. 3. Lesion within posterior dome of liver with peripheral discontiguous enhancement measures 1.7 cm. This is unchanged from previous exam and most likely represents a benign hemangioma.  Last EGD: 02/03/2020 - showed multiple small gastric polyps which were sessile fundic gland polyp.  Small bowel was biopsied, histology was normal.   Colonoscopy:02/03/20 3 mm polyp in the sigmoid colon that was removed with a cold snare, as well as hemorrhoids, histology was consistent with a tubular adenoma   Past Medical History:  Diagnosis Date   Anxiety    Bipolar 1 disorder (HCC)    Depression    OCD (obsessive compulsive disorder)    PTSD (post-traumatic stress disorder)    S/P ECT (electroconvulsive therapy)  Past Surgical History:  Procedure Laterality Date   BIOPSY  02/03/2020   Procedure: BIOPSY;  Surgeon: Montez Morita, Quillian Quince, MD;  [facility];  Service: Gastroenterology;;   COLONOSCOPY WITH PROPOFOL N/A 02/03/2020   [name]: 34m polyp in sigmoid colon  as well as hemorrhoids, histology consistent with tubular adenoma   ESOPHAGOGASTRODUODENOSCOPY (EGD) WITH PROPOFOL N/A 02/03/2020   castaneda: small gastric polyps,  sessile, and fundic gland, small bowel bx normal   INDUCED ABORTION N/A 1980   POLYPECTOMY  02/03/2020   Procedure: POLYPECTOMY;  Surgeon: CMontez Morita DQuillian Quince MD;  Location: [facility];  Service: Gastroenterology;;   VEIN SURGERY      Current Outpatient Medications  Medication Sig Dispense Refill   clonazePAM (KLONOPIN) 0.5 MG tablet Take 0.5 mg by mouth in the morning, at noon, and at bedtime.     diphenoxylate-atropine (LOMOTIL) 2.5-0.025 MG tablet Take 1 tablet by mouth in the morning and at bedtime. Prn.     levothyroxine (SYNTHROID) 25 MCG tablet Take 25 mcg by mouth daily before breakfast.     losartan (COZAAR) 25 MG tablet Take 25 mg by mouth daily.     OLANZapine (ZYPREXA) 5 MG tablet Take 5 mg by mouth at bedtime.     OVER THE COUNTER MEDICATION Probiotic 1 po Qd per patient.     OVER THE COUNTER MEDICATION B 12 one po QD per patient .     OVER THE COUNTER MEDICATION Vit D 2 1.2/50,000 IU once per week.     vancomycin (VANCOCIN) 125 MG capsule Take 125 mg by mouth 4 (four) times daily.     Vilazodone HCl (VIIBRYD) 40 MG TABS Take 40 mg by mouth daily.     bismuth subsalicylate (PEPTO BISMOL) 262 MG chewable tablet Chew 524 mg by mouth as needed. (Patient not taking: Reported on 10/23/2021)     esomeprazole (NEXIUM) 40 MG capsule Take 1 capsule (40 mg total) by mouth daily before breakfast. 30 capsule 11   famotidine (PEPCID) 40 MG tablet Take 1 tablet (40 mg total) by mouth daily. 90 tablet 3   ondansetron (ZOFRAN-ODT) 4 MG disintegrating tablet Take 1 tablet (4 mg total) by mouth every 8 (eight) hours as needed for nausea or vomiting. (Patient not taking: Reported on 10/23/2021) 60 tablet 2   propranolol (INDERAL) 20 MG tablet Take 20 mg by mouth 3 (three) times daily. '10mg'$  one tid     rifaximin (XIFAXAN) 550 MG TABS tablet Take 1 tablet (550 mg total) by mouth 3 (three) times daily. (Patient not taking: Reported on 10/23/2021) 42 tablet 0   rosuvastatin (CRESTOR) 20 MG  tablet Take 20 mg by mouth daily. (Patient not taking: Reported on 09/18/2021)     No current facility-administered medications for this visit.    Allergies as of 10/23/2021 - Review Complete 09/18/2021  Allergen Reaction Noted   Escitalopram Nausea And Vomiting 12/28/2019   Seroquel [quetiapine fumarate] Shortness Of Breath 09/26/2019   Trazodone Other (See Comments) 02/15/2020    Family History  Problem Relation Age of Onset   Depression Mother    Pneumonia Mother     Social History   Socioeconomic History   Marital status: Married    Spouse name: Not on file   Number of children: Not on file   Years of education: Not on file   Highest education level: Not on file  Occupational History   Occupation: Disabled  Tobacco Use   Smoking status: Never    Passive  exposure: Current   Smokeless tobacco: Never  Vaping Use   Vaping Use: Never used  Substance and Sexual Activity   Alcohol use: Yes    Comment: occasionally   Drug use: Yes    Types: Marijuana    Comment: pt's husband reports she uses it to help with anxiety and depression   Sexual activity: Not Currently    Birth control/protection: Post-menopausal  Other Topics Concern   Not on file  Social History Narrative   Pt lives in [place] with husband.  She is on disability.  Pt stated that she receives outpatient psychiatry services through [facility].  She does not receive outpatient therapy.   Social Determinants of Health   Financial Resource Strain: Not on file  Food Insecurity: Not on file  Transportation Needs: Not on file  Physical Activity: Inactive (09/05/2020)   Exercise Vital Sign    Days of Exercise per Week: 0 days    Minutes of Exercise per Session: 0 min  Stress: Not on file  Social Connections: Not on file   Review of systems General: negative for malaise, night sweats, fever, chills, weight loss Neck: Negative for lumps, goiter, pain and significant neck swelling Resp: Negative for  cough, wheezing, dyspnea at rest CV: Negative for chest pain, leg swelling, palpitations, orthopnea GI: denies melena, hematochezia, nausea, vomiting, constipation, dysphagia, odyonophagia, early satiety or unintentional weight loss. +diarrhea MSK: Negative for joint pain or swelling, back pain, and muscle pain. Derm: Negative for itching or rash Psych: Denies depression, anxiety, memory loss, confusion. No homicidal or suicidal ideation.  Heme: Negative for prolonged bleeding, bruising easily, and swollen nodes. Endocrine: Negative for cold or heat intolerance, polyuria, polydipsia and goiter. Neuro: negative for tremor, gait imbalance, syncope and seizures. The remainder of the review of systems is noncontributory.  Physical Exam: BP 102/67 (BP Location: Right Arm, Patient Position: Sitting, Cuff Size: Large)   Pulse 71   Temp 98.7 F (37.1 C) (Oral)   Ht '5\' 5"'$  (1.651 m)   Wt 199 lb (90.3 kg)   BMI 33.12 kg/m  General:   Alert and oriented. No distress noted. Pleasant and cooperative.  Head:  Normocephalic and atraumatic. Eyes:  Conjuctiva clear without scleral icterus. Mouth:  Oral mucosa pink and moist. Good dentition. No lesions. Heart: Normal rate and rhythm, s1 and s2 heart sounds present.  Lungs: Clear lung sounds in all lobes. Respirations equal and unlabored. Abdomen:  +BS, soft, non-tender and non-distended. No rebound or guarding. No HSM or masses noted. Derm: No palmar erythema or jaundice Msk:  Symmetrical without gross deformities. Normal posture. Extremities:  Without edema. Neurologic:  Alert and  oriented x4 Psych:  Alert and cooperative. Normal mood and affect.  Invalid input(s): "6 MONTHS"   ASSESSMENT: Davene Jobin is a 61 y.o. female presenting today for follow up of diarrhea.   Patient with recent positive c diff testing, currently finishing up two week course of vancomycin, having 2-4 episodes of diarrhea per day, though similar to her baseline as she  has hx of IBS-M. She did not complete Xifaxan course after last visit with Dr. Jenetta Downer. She notes frequent post prandial stooling.  Did encourage her to complete entire course of vancomycin.  I will send refill of dicyclomine to be used up to 4 times a day as her husband today he felt she did better on this medication.  Also would recommend completing course of Xifaxan previously prescribed by Dr. Jenetta Downer if she is still having diarrhea after  completion of vancomycin as a suspect most of her symptoms are still related to her underlying IBS.  She will call our office with an update once vancomycin is completed.  I encouraged her to keep a food journal and provide a low FODMAP guideline as well.  Reassuringly she has no rectal bleeding melena and nausea and vomiting seem to be well under control at this time.  I again encouraged her to continue with close following with her psychiatrist as her mental health is likely playing a big role in her GI symptoms we well.    PLAN:  Finish vancomycin 2. Rx dicyclomine QID 3. Consider starting Xifaxan after vancomycin, pt to call with an update on symptoms in a few days 4.food journal and low fodmap diet  All questions were answered, patient verbalized understanding and is in agreement with plan as outlined above.    Follow Up: 6 months  Roddrick Sharron L. Alver Sorrow, MSN, APRN, AGNP-C Adult-Gerontology Nurse Practitioner [facility]

## 2021-10-23 NOTE — Telephone Encounter (Signed)
Thanks

## 2021-10-23 NOTE — Telephone Encounter (Signed)
Patient called today stating she is concerned that she is still having diarrhea. She says she has 2-4 loose stools per day, denies any dark or blood stools , no fever. She has been on an antibiotic for over two weeks now and is not taking any imodium. Patient aware we had a cancellation here today and aware to be here at 11:45 am.

## 2021-11-06 ENCOUNTER — Ambulatory Visit (INDEPENDENT_AMBULATORY_CARE_PROVIDER_SITE_OTHER): Payer: Medicare Other | Admitting: Gastroenterology

## 2021-11-13 ENCOUNTER — Ambulatory Visit (INDEPENDENT_AMBULATORY_CARE_PROVIDER_SITE_OTHER): Payer: Medicaid Other | Admitting: Gastroenterology

## 2021-11-17 ENCOUNTER — Telehealth (INDEPENDENT_AMBULATORY_CARE_PROVIDER_SITE_OTHER): Payer: Self-pay

## 2021-11-17 NOTE — Telephone Encounter (Signed)
Patient states she still having loose stools after finishing Vancomycin for C diff two weeks ago. She says she is having between two to six loose to watery stools per day. She is taking lomotil bid and compazine as needed. She is also feeling nauseous. She says she has been having to wear diaper to prevent fecal incontinence accidents.  Please advise.

## 2021-11-20 ENCOUNTER — Other Ambulatory Visit (INDEPENDENT_AMBULATORY_CARE_PROVIDER_SITE_OTHER): Payer: Self-pay | Admitting: Gastroenterology

## 2021-11-20 DIAGNOSIS — A0472 Enterocolitis due to Clostridium difficile, not specified as recurrent: Secondary | ICD-10-CM

## 2021-11-20 MED ORDER — DIFICID 200 MG PO TABS: 200.0000 mg | ORAL_TABLET | Freq: Two times a day (BID) | ORAL | 0 refills | Status: AC

## 2021-11-20 NOTE — Telephone Encounter (Signed)
Spoke with the patient , states that she has presented improvement in her diarrhea but still presenting 4-8 Bms per day which are watery. It is possible she has persistent diarrhea due to C. Diff, although her clinical picture is confusing with her underlying IBS.  Will send Dificid prescription.

## 2021-12-25 ENCOUNTER — Telehealth (INDEPENDENT_AMBULATORY_CARE_PROVIDER_SITE_OTHER): Payer: Self-pay

## 2021-12-25 NOTE — Telephone Encounter (Signed)
Still having Diarrhea, thinks she still has Cdiff, would like to be tested again.

## 2021-12-25 NOTE — Telephone Encounter (Signed)
Please resend an order for C. Diff testing Thanks

## 2021-12-26 ENCOUNTER — Other Ambulatory Visit (INDEPENDENT_AMBULATORY_CARE_PROVIDER_SITE_OTHER): Payer: Self-pay

## 2021-12-26 DIAGNOSIS — K581 Irritable bowel syndrome with constipation: Secondary | ICD-10-CM

## 2021-12-26 DIAGNOSIS — A0472 Enterocolitis due to Clostridium difficile, not specified as recurrent: Secondary | ICD-10-CM

## 2021-12-26 DIAGNOSIS — K582 Mixed irritable bowel syndrome: Secondary | ICD-10-CM

## 2021-12-26 DIAGNOSIS — R197 Diarrhea, unspecified: Secondary | ICD-10-CM

## 2021-12-26 NOTE — Telephone Encounter (Signed)
Patient aware to have recollected at [facility].

## 2021-12-27 DIAGNOSIS — R197 Diarrhea, unspecified: Secondary | ICD-10-CM | POA: Diagnosis not present

## 2021-12-27 DIAGNOSIS — K582 Mixed irritable bowel syndrome: Secondary | ICD-10-CM | POA: Diagnosis not present

## 2021-12-27 DIAGNOSIS — K581 Irritable bowel syndrome with constipation: Secondary | ICD-10-CM | POA: Diagnosis not present

## 2021-12-30 LAB — CLOSTRIDIUM DIFFICILE BY PCR: Toxigenic C. Difficile by PCR: NEGATIVE

## 2022-01-23 ENCOUNTER — Telehealth (INDEPENDENT_AMBULATORY_CARE_PROVIDER_SITE_OTHER): Payer: Self-pay

## 2022-01-23 NOTE — Telephone Encounter (Signed)
Patient called and says she has been having diarrhea 4-6 times per day, she is weak and not feeling well. She also reports she has nausea and vomiting most mornings when she wakes. She denies any abdominal pain,dark or bloody stools, fevers, and has no appetite. She says she was taking lomotil but has ran out of this. She is taking promethazine 12.5 mg three times per day. She says she is able to eat and drink and keep it down. She is also drinking Gatorade, and does not feel she needs to go to the ed for fluids. She says if anything is called in she needs this to be sent to [facility] in [place]. Please advise.

## 2022-01-24 NOTE — Telephone Encounter (Signed)
I called the patient to discuss her symptoms, however she did not pick up the phone.  I left a detailed voice message asking for the exact frequency of Lomotil and Pepto-Bismol intake.  May consider adding colestipol in the future if she presents persistent diarrhea.  Fortunately, her C. difficile testing was negative.

## 2022-01-25 ENCOUNTER — Other Ambulatory Visit: Payer: Self-pay | Admitting: Gastroenterology

## 2022-01-25 DIAGNOSIS — K582 Mixed irritable bowel syndrome: Secondary | ICD-10-CM

## 2022-01-25 MED ORDER — DIPHENOXYLATE-ATROPINE 2.5-0.025 MG PO TABS: 1.0000 | ORAL_TABLET | Freq: Four times a day (QID) | ORAL | 3 refills | Status: DC | PRN

## 2022-01-25 NOTE — Telephone Encounter (Signed)
Patient states she ran out of Lomotil around two weeks ago, and she seldom takes pepto. Please advise.

## 2022-01-25 NOTE — Telephone Encounter (Signed)
Resent prescription for Lomotil, can take every 6 hours as needed

## 2022-01-26 NOTE — Telephone Encounter (Signed)
Patient made aware.

## 2022-02-12 ENCOUNTER — Emergency Department (HOSPITAL_COMMUNITY)
Admission: EM | Admit: 2022-02-12 | Discharge: 2022-02-13 | Disposition: A | Discharge status: Home / Self Care | Payer: Medicare Other | Attending: Emergency Medicine | Admitting: Emergency Medicine

## 2022-02-12 ENCOUNTER — Encounter (HOSPITAL_COMMUNITY): Payer: Self-pay

## 2022-02-12 ENCOUNTER — Other Ambulatory Visit: Payer: Self-pay

## 2022-02-12 DIAGNOSIS — F419 Anxiety disorder, unspecified: Secondary | ICD-10-CM | POA: Insufficient documentation

## 2022-02-12 DIAGNOSIS — Z743 Need for continuous supervision: Secondary | ICD-10-CM | POA: Diagnosis not present

## 2022-02-12 DIAGNOSIS — Z5321 Procedure and treatment not carried out due to patient leaving prior to being seen by health care provider: Secondary | ICD-10-CM | POA: Diagnosis not present

## 2022-02-12 DIAGNOSIS — R5381 Other malaise: Secondary | ICD-10-CM | POA: Diagnosis not present

## 2022-02-12 NOTE — ED Triage Notes (Signed)
Pt denies suicidal ideations. Denies any thoughts of hurting herself or anyone else

## 2022-02-12 NOTE — ED Triage Notes (Signed)
Pt to er via ems, per ems pt is here for anxiety, states that she has had some mariajuana and beers this am and has taken most of her psyc medications, pt states that she is very anxious, states that she was locked in a closet for 10 years and is very anxious, states that she is worried that the cops are going to come and take her to the court house.

## 2022-02-12 NOTE — ED Provider Triage Note (Signed)
Emergency Medicine Provider Triage Evaluation Note  Dawn Kelley , a 61 y.o. female  was evaluated in triage.  Pt complains of By [facility] - arrives for anxiety, PTSD, ETOH abuse - feeling paranoid.  Review of Systems  Positive: Paranoia, anxiety Negative: F/c/n/v/d/sob/cp/ha  Physical Exam  BP 139/79 (BP Location: Left Arm)   Pulse 83   Temp 98.1 F (36.7 C) (Oral)   Resp 18   Ht 1.676 m ('5\' 6"'$ )   Wt 88.5 kg   SpO2 94%   BMI 31.47 kg/m  Gen:   Awake, no distress  ambulatory withotu difficulty Resp:  Normal effort  MSK:   Moves extremities without difficulty  Other:  No tachycarida, no fever, no edema, clear speech.  Medical Decision Making  Medically screening exam initiated at 1:38 PM.  Appropriate orders placed.  Myca Perno was informed that the remainder of the evaluation will be completed by another provider, this initial triage assessment does not replace that evaluation, and the importance of remaining in the ED until their evaluation is complete.  Well appearing medically - needs eval for her self reported decompensation in her mental health - denies SI/HI etc.  And no command hallucionations   Noemi Chapel, MD 02/12/22 1339

## 2022-02-17 ENCOUNTER — Encounter (HOSPITAL_COMMUNITY): Payer: Self-pay

## 2022-02-17 ENCOUNTER — Emergency Department (HOSPITAL_COMMUNITY)
Admission: EM | Admit: 2022-02-17 | Discharge: 2022-02-17 | Discharge status: Against Medical Advice | Payer: Medicare Other | Attending: Emergency Medicine | Admitting: Emergency Medicine

## 2022-02-17 ENCOUNTER — Other Ambulatory Visit: Payer: Self-pay

## 2022-02-17 DIAGNOSIS — F419 Anxiety disorder, unspecified: Secondary | ICD-10-CM | POA: Diagnosis not present

## 2022-02-17 DIAGNOSIS — Z5321 Procedure and treatment not carried out due to patient leaving prior to being seen by health care provider: Secondary | ICD-10-CM | POA: Diagnosis not present

## 2022-02-17 NOTE — ED Notes (Signed)
Pt denies SI or HI .

## 2022-02-17 NOTE — ED Notes (Signed)
Pt requesting to leave AMA

## 2022-02-17 NOTE — ED Triage Notes (Signed)
Pt states she is full of anxiety and is having n/v episodes and would like the ED to fix her anxiety because her psychiatrist is unable to fix it for 3 years now. Pt states she has been drinking alcohol 10 beers a day for a month and psychiatrist rx a new medication on Thurs.

## 2022-02-17 NOTE — ED Notes (Signed)
Pt ambulated to bathroom and nurse requested ua.

## 2022-02-19 ENCOUNTER — Telehealth (INDEPENDENT_AMBULATORY_CARE_PROVIDER_SITE_OTHER): Payer: Self-pay

## 2022-02-19 DIAGNOSIS — I10 Essential (primary) hypertension: Secondary | ICD-10-CM | POA: Diagnosis not present

## 2022-02-19 DIAGNOSIS — I700 Atherosclerosis of aorta: Secondary | ICD-10-CM | POA: Diagnosis not present

## 2022-02-19 DIAGNOSIS — E038 Other specified hypothyroidism: Secondary | ICD-10-CM | POA: Diagnosis not present

## 2022-02-19 NOTE — Telephone Encounter (Signed)
Patient called today stating she has " played with demons", she says her anxiety has gotten so bad she has started drinking beer every night, and she feels she is now an alcoholic as she drinks several beers per night to calm her nerves. She says this has contributed to her having more episodes of diarrhea. She says she is having a loose bowel movement after every meal she has. She says she has been drinking Pedialyte,Gatorades and ensures also . She denies any abdominal pain or fevers. She says she is having episodes of lightheadedness, even when she is not drinking. She is not taking any of her lomotil, bentyl, Zofran, nor any of her gi meds.

## 2022-02-20 NOTE — Telephone Encounter (Signed)
Spoke to the patient, she reported that one of her friends recently passed away and she started drinking because of that which made her diarrhea worse.  However she has recently stopped drinking alcohol and this has led to resolution of her diarrhea.  She reports feeling much better and is not having any complaints at the moment.

## 2022-02-23 ENCOUNTER — Telehealth: Payer: Self-pay

## 2022-02-23 NOTE — Telephone Encounter (Signed)
     Reason for call: ED-Follow up call    Patient  visited [place] on 02/17/2022    Telephone encounter attempt :  1st Attempt  A HIPAA compliant voice message was left requesting a return call.  Instructed patient to call back at [phone] at their earliest convenience.  [place] management  [place], [place]  Main Phone: [phone]  E-mail: [email or web address]  Website: [email or web address]

## 2022-03-06 DIAGNOSIS — I700 Atherosclerosis of aorta: Secondary | ICD-10-CM | POA: Diagnosis not present

## 2022-03-06 DIAGNOSIS — I10 Essential (primary) hypertension: Secondary | ICD-10-CM | POA: Diagnosis not present

## 2022-03-06 DIAGNOSIS — E038 Other specified hypothyroidism: Secondary | ICD-10-CM | POA: Diagnosis not present

## 2022-03-06 DIAGNOSIS — H8110 Benign paroxysmal vertigo, unspecified ear: Secondary | ICD-10-CM | POA: Diagnosis not present

## 2022-03-09 ENCOUNTER — Telehealth: Payer: Self-pay

## 2022-03-09 NOTE — Telephone Encounter (Signed)
      Reason for call: ED-Follow up call      Patient  visited [place] on 02/17/2022     Telephone encounter attempt :  2nd Attempt  A HIPAA compliant voice message was left requesting a return call.  Instructed patient to call back at [phone] at their earliest convenience.  [place] management  [place], [place]  Main Phone: [phone]  E-mail: [email or web address]  Website: [email or web address]

## 2022-03-12 DIAGNOSIS — K580 Irritable bowel syndrome with diarrhea: Secondary | ICD-10-CM | POA: Diagnosis not present

## 2022-03-15 ENCOUNTER — Telehealth (INDEPENDENT_AMBULATORY_CARE_PROVIDER_SITE_OTHER): Payer: Self-pay

## 2022-03-15 NOTE — Telephone Encounter (Signed)
Patient calling this am states she thinks Cdiff is back as she is having 3-4 loose stools per day. She says her anxiety is up and she needs a new Psych doctor. I advised that we rechecked her Cdiff in August and it was negative. I advised diarrhea probably related to the anxiety.She says she is taking her probiotic, lomotil 3 times per day and taking dicyclomine prn and pepto prn. She says she is tired of taking all of these psych meds that she has no feelings, she wants to cry but can't, she is not able to express emotions. Please advise.

## 2022-03-15 NOTE — Telephone Encounter (Signed)
spoke with the patient she is aware that she needs to take bentyl bid and if that does not help she should let us know and we can repeat Cdiff testing. Patient says she is not eating, I advised that she try to drink one to two ensures per day to supplement. Patient states understanding.

## 2022-03-15 NOTE — Telephone Encounter (Signed)
I agree with this, she may try taking dicyclomine twice a day. If she is having WATERY bowel movents, we may consider a repeat C. Diff testing.

## 2022-03-15 NOTE — Telephone Encounter (Signed)
Thanks

## 2022-03-20 ENCOUNTER — Telehealth (INDEPENDENT_AMBULATORY_CARE_PROVIDER_SITE_OTHER): Payer: Self-pay

## 2022-03-20 ENCOUNTER — Other Ambulatory Visit (INDEPENDENT_AMBULATORY_CARE_PROVIDER_SITE_OTHER): Payer: Self-pay

## 2022-03-20 DIAGNOSIS — R197 Diarrhea, unspecified: Secondary | ICD-10-CM

## 2022-03-20 DIAGNOSIS — R109 Unspecified abdominal pain: Secondary | ICD-10-CM

## 2022-03-20 NOTE — Telephone Encounter (Signed)
Thanks

## 2022-03-20 NOTE — Telephone Encounter (Signed)
Patient still complaining of diarrhea and wants to proceed with Cdiff testing. I have placed the order for [facility] in [place]. Patient will go to [employer] to pick up containers.

## 2022-03-21 DIAGNOSIS — R197 Diarrhea, unspecified: Secondary | ICD-10-CM | POA: Diagnosis not present

## 2022-03-21 DIAGNOSIS — R109 Unspecified abdominal pain: Secondary | ICD-10-CM | POA: Diagnosis not present

## 2022-03-23 ENCOUNTER — Telehealth: Payer: Self-pay

## 2022-03-23 LAB — CLOSTRIDIUM DIFFICILE BY PCR: Toxigenic C. Difficile by PCR: NEGATIVE

## 2022-03-23 NOTE — Telephone Encounter (Signed)
     eason for call: ED-Follow up call      Patient  visited [place] on 02/17/2022     Telephone encounter attempt :  3rd Attempt  A HIPAA compliant voice message was left requesting a return call.  Instructed patient to call back at [phone] at their earliest convenience.  Left message 3 times, will close follow up.   [facility] management  [place], [place]  Main Phone: [phone]  E-mail: [email or web address]  Website: [email or web address]

## 2022-03-26 ENCOUNTER — Ambulatory Visit (INDEPENDENT_AMBULATORY_CARE_PROVIDER_SITE_OTHER): Payer: Medicare Other | Admitting: Gastroenterology

## 2022-03-26 ENCOUNTER — Encounter (INDEPENDENT_AMBULATORY_CARE_PROVIDER_SITE_OTHER): Payer: Self-pay

## 2022-03-26 ENCOUNTER — Encounter (INDEPENDENT_AMBULATORY_CARE_PROVIDER_SITE_OTHER): Payer: Self-pay | Admitting: Gastroenterology

## 2022-03-26 VITALS — BP 118/78 | HR 59 | Temp 97.6°F | Ht 66.0 in | Wt 195.7 lb

## 2022-03-26 DIAGNOSIS — R112 Nausea with vomiting, unspecified: Secondary | ICD-10-CM | POA: Diagnosis not present

## 2022-03-26 DIAGNOSIS — K580 Irritable bowel syndrome with diarrhea: Secondary | ICD-10-CM | POA: Diagnosis not present

## 2022-03-26 DIAGNOSIS — R1013 Epigastric pain: Secondary | ICD-10-CM

## 2022-03-26 DIAGNOSIS — F54 Psychological and behavioral factors associated with disorders or diseases classified elsewhere: Secondary | ICD-10-CM

## 2022-03-26 MED ORDER — PROMETHAZINE HCL 12.5 MG PO TABS: 12.5000 mg | ORAL_TABLET | Freq: Three times a day (TID) | ORAL | 0 refills | Status: DC | PRN

## 2022-03-26 MED ORDER — RIFAXIMIN 550 MG PO TABS: 550.0000 mg | ORAL_TABLET | Freq: Two times a day (BID) | ORAL | 0 refills | Status: DC

## 2022-03-26 MED ORDER — PANTOPRAZOLE SODIUM 40 MG PO TBEC: 40.0000 mg | DELAYED_RELEASE_TABLET | Freq: Every day | ORAL | 3 refills | Status: DC

## 2022-03-26 MED ORDER — RIFAXIMIN 550 MG PO TABS: 550.0000 mg | ORAL_TABLET | Freq: Three times a day (TID) | ORAL | 0 refills | Status: AC

## 2022-03-26 NOTE — Patient Instructions (Addendum)
We will try a 2 week course of xifaxan twice daily for your diarrhea Please continue to use lomotil every 6 hours as needed I am also sending protonix '40mg'$  once daily, this is an acid reflux medication, I would like to see if this helps some with your nausea and vomiting I will also send a short course of nausea medication for you to have on hand Please continue to stay well hydrated and do protein shakes twice daily if you are not tolerating foods I am providing the low fodmap diet as this has common foods that tend to make diarrhea worse I have sent a referral to our Psychiatric group here in the building to see if they will be willing to see you since you are not happy with your current provider  Follow up 3 months

## 2022-03-26 NOTE — Addendum Note (Signed)
Addended by: Harvel Quale on: 03/26/2022 04:50 PM   Modules accepted: Level of Service

## 2022-03-26 NOTE — Progress Notes (Addendum)
Referring Provider: Neale Burly, MD Primary Care Physician:  Neale Burly, MD Primary GI Physician: [place]   Chief Complaint  Patient presents with   Diarrhea    Follow up on IBS, diarrhea, nausea, vomiting, mild abdominal pain.    HPI:   Vashti Bolanos is a 61 y.o. female with past medical history of   refractory severe anxiety/depression (hx of ECT therapy), bipolar disorder, PTSD, fibromyalgia, gerd and IBS-M,   Patient presenting today for follow up of diarrhea  History: C diff positive on testing through [facility] in May, treated with Vancomycin '125mg'$  QID, started on 5/31.   At last visit in June, she continued to have 2-4 watery BMs per day.  She was also given dicyclomine at [facility] and she completed entire bottle of this. noticing frequent post prandial stooling. She ran out of her lomotil. She does not think that she took the Xifaxan that was sent by Dr. Jenetta Downer.   Nausea and vomiting are improved, not taking anything for this currently. She feels that appetite is poor, she feels that she has to avoid certain foods as they cause worsening diarrhea. Husband thinks that dyclocmine may have been helping her symptoms some. She states that she is seeing psychiatry, she is currently on clonazepam 0.'5mg'$  TID, she states that they will not give her any stronger benzos or up her dose any. She continues to have a lot of issues with her anxiety. She is very anxious during her visit.   She was advised to finish vanc course, Dicyclomine QID, consider starting xifaxan after vanc, low FODMAP diet.   Patient called in July with ongoing diarrhea, c diff testing done at that time was negative. Advised to take lomotil q6h. In October patient called stating that she had begun drinking alcohol and having worsening diarrhea, she stopped etoh thereafter with some improvement. Called in early November with 3-4 loose stools per day again, using lomotil bentyl, pepto,  Had repeat C diff  testing  done on 11/8  that was also negative.   Present:  Patient states she is having 2-4 watery BMs per day. Sometimes she a can eat and within a few minutes she has to go to the restroom to have a BM. She also notes that she is having intermittent vomiting as well. Husband reports that she is having vomiting almost every morning when she wakes up. They think that vomiting began shortly after her last OV, though per chart review appears she has had n/v on and off for some time. She is taking lomotil 3-4x/day. She is not taking dicyclomine as she ran out of her previous prescription, unsure if this was providing any results. Does not think she is taking anything for nausea/vomiting. Appetite is not good. She has some upper/mid abdominal pain, does not seem to be improved with anything. She denies any rectal bleeding or melena. She has had a small amount of weight loss, approx 6 pounds since May. She is doing 2 protein shakes per day. She is not drinking etoh or using NSAIDs frequently. She does not have much acid regurgitation or heartburn. Does endorse drinking juice at times as she doesn't like to only drink water. Patient notes multiple times during the visit that she feels very nervous.   CT abdomen  09/12/21 1. No acute findings. 2. Colon appears diffusely decompressed. Mild wall thickening of the colon likely reflects incomplete distension. Correlate for any clinical signs or symptoms of colitis. 3. Lesion within posterior dome of  liver with peripheral discontiguous enhancement measures 1.7 cm. This is unchanged from previous exam and most likely represents a benign hemangioma.  Last EGD: 02/03/2020 - showed multiple small gastric polyps which were sessile fundic gland polyp. Small bowel was biopsied, histology was normal.   Colonoscopy:02/03/20 3 mm polyp in the sigmoid colon that was removed with a cold snare, as well as hemorrhoids, histology was consistent with a tubular adenoma   Past Medical  History:  Diagnosis Date   Anxiety    Bipolar 1 disorder (National)    Depression    OCD (obsessive compulsive disorder)    PTSD (post-traumatic stress disorder)    S/P ECT (electroconvulsive therapy)     Past Surgical History:  Procedure Laterality Date   BIOPSY  02/03/2020   Procedure: BIOPSY;  Surgeon: Harvel Quale, MD;  [facility];  Service: Gastroenterology;;   COLONOSCOPY WITH PROPOFOL N/A 02/03/2020   [name]: 60m polyp in sigmoid colon  as well as hemorrhoids, histology consistent with tubular adenoma   ESOPHAGOGASTRODUODENOSCOPY (EGD) WITH PROPOFOL N/A 02/03/2020   castaneda: small gastric polyps, sessile, and fundic gland, small bowel bx normal   INDUCED ABORTION N/A 1980   POLYPECTOMY  02/03/2020   Procedure: POLYPECTOMY;  Surgeon: CMontez Morita DQuillian Quince MD;  Location: [facility];  Service: Gastroenterology;;   VEIN SURGERY      Current Outpatient Medications  Medication Sig Dispense Refill   clonazePAM (KLONOPIN) 0.5 MG tablet Take 0.5 mg by mouth in the morning, at noon, and at bedtime.     diphenoxylate-atropine (LOMOTIL) 2.5-0.025 MG tablet Take 1 tablet by mouth 4 (four) times daily as needed for diarrhea or loose stools (diarrhea). Prn. 120 tablet 3   esomeprazole (NEXIUM) 40 MG capsule Take 1 capsule (40 mg total) by mouth daily before breakfast. 30 capsule 11   dicyclomine (BENTYL) 10 MG capsule Take 1 capsule (10 mg total) by mouth 4 (four) times daily -  before meals and at bedtime. (Patient not taking: Reported on 03/26/2022) 90 capsule 1   famotidine (PEPCID) 40 MG tablet Take 1 tablet (40 mg total) by mouth daily. 90 tablet 3   levothyroxine (SYNTHROID) 25 MCG tablet Take 25 mcg by mouth daily before breakfast.     losartan (COZAAR) 25 MG tablet Take 25 mg by mouth daily.     OLANZapine (ZYPREXA) 5 MG tablet Take 5 mg by mouth at bedtime.     ondansetron (ZOFRAN-ODT) 4 MG disintegrating tablet Take 1 tablet (4 mg total) by mouth  every 8 (eight) hours as needed for nausea or vomiting. (Patient not taking: Reported on 10/23/2021) 60 tablet 2   OVER THE COUNTER MEDICATION Probiotic 1 po Qd per patient.     OVER THE COUNTER MEDICATION B 12 one po QD per patient .     OVER THE COUNTER MEDICATION Vit D 2 1.2/50,000 IU once per week.     propranolol (INDERAL) 20 MG tablet Take 20 mg by mouth 3 (three) times daily. '10mg'$  one tid     rosuvastatin (CRESTOR) 20 MG tablet Take 20 mg by mouth daily. (Patient not taking: Reported on 09/18/2021)     Vilazodone HCl (VIIBRYD) 40 MG TABS Take 40 mg by mouth daily.     No current facility-administered medications for this visit.    Allergies as of 03/26/2022 - Review Complete 03/26/2022  Allergen Reaction Noted   Escitalopram Nausea And Vomiting 12/28/2019   Seroquel [quetiapine fumarate] Shortness Of Breath 09/26/2019   Trazodone Other (  See Comments) 02/15/2020    Family History  Problem Relation Age of Onset   Depression Mother    Pneumonia Mother     Social History   Socioeconomic History   Marital status: Married    Spouse name: Not on file   Number of children: Not on file   Years of education: Not on file   Highest education level: Not on file  Occupational History   Occupation: Disabled  Tobacco Use   Smoking status: Never   Smokeless tobacco: Never  Vaping Use   Vaping Use: Never used  Substance and Sexual Activity   Alcohol use: Yes    Alcohol/week: 10.0 standard drinks of alcohol    Types: 10 Cans of beer per week    Comment: daily   Drug use: Yes    Types: Marijuana    Comment: occassional   Sexual activity: Not Currently    Birth control/protection: Post-menopausal  Other Topics Concern   Not on file  Social History Narrative   Pt lives in [place] with husband.  She is on disability.  Pt stated that she receives outpatient psychiatry services through [facility].  She does not receive outpatient therapy.   Social Determinants of Health    Financial Resource Strain: Low Risk  (09/05/2020)   Overall Financial Resource Strain (CARDIA)    Difficulty of Paying Living Expenses: Not hard at all  Food Insecurity: No Food Insecurity (09/05/2020)   Hunger Vital Sign    Worried About Running Out of Food in the Last Year: Never true    Ran Out of Food in the Last Year: Never true  Transportation Needs: No Transportation Needs (09/05/2020)   PRAPARE - Hydrologist (Medical): No    Lack of Transportation (Non-Medical): No  Physical Activity: Inactive (09/05/2020)   Exercise Vital Sign    Days of Exercise per Week: 0 days    Minutes of Exercise per Session: 0 min  Stress: Unknown (09/05/2020)   [place]    Feeling of Stress : Patient refused  Social Connections: Unknown (09/05/2020)   Social Connection and Isolation Panel [NHANES]    Frequency of Communication with Friends and Family: More than three times a week    Frequency of Social Gatherings with Friends and Family: Once a week    Attends Religious Services: Never    Marine scientist or Organizations: No    Attends Music therapist: Never    Marital Status: Patient refused   Review of systems General: negative for malaise, night sweats, fever, chills, weight loss Neck: Negative for lumps, goiter, pain and significant neck swelling Resp: Negative for cough, wheezing, dyspnea at rest CV: Negative for chest pain, leg swelling, palpitations, orthopnea GI: denies melena, hematochezia,constipation, dysphagia, odyonophagia, early satiety or unintentional weight loss. +vomiting +diarrhea +epigastric pain MSK: Negative for joint pain or swelling, back pain, and muscle pain. Derm: Negative for itching or rash Psych: Denies memory loss, confusion. No homicidal or suicidal ideation. +endorses anxiety Heme: Negative for prolonged bleeding, bruising easily, and swollen  nodes. Endocrine: Negative for cold or heat intolerance, polyuria, polydipsia and goiter. Neuro: negative for tremor, gait imbalance, syncope and seizures. The remainder of the review of systems is noncontributory.  Physical Exam: There were no vitals taken for this visit. General:   Alert and oriented. Anxious, tearful  Head:  Normocephalic and atraumatic. Eyes:  Conjuctiva clear without scleral icterus. Mouth:  Oral mucosa pink and moist. Good dentition. No lesions. Heart: Normal rate and rhythm, s1 and s2 heart sounds present.  Lungs: Clear lung sounds in all lobes. Respirations equal and unlabored. Abdomen:  +BS, soft, non-tender and non-distended. No rebound or guarding. No HSM or masses noted. Derm: No palmar erythema or jaundice Msk:  Symmetrical without gross deformities. Normal posture. Extremities:  Without edema. Neurologic:  Alert and  oriented x4 Psych:  Alert and cooperative. Anxious and tearful   Invalid input(s): "6 MONTHS"   ASSESSMENT: Astha Probasco is a 61 y.o. female presenting today for ongoing diarrhea and vomiting  Long history of diarrhea with underlying IBS, previous treatment for C diff in August with negative testing x2 since then. She continues with 2-4 watery stools per day. 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 She continues to state she is anxious during the visit and visibly appears to be. She does not feel like she is getting what she needs from her current mental health provider. Will trial 2 week course of Xifaxan '550mg'$  TID for IBS-D as she does not think she completed this previously. I am also providing low FODMAP diet as she tells me she is drinking certain juices at time and this will be helpful in avoiding certain triggers that worsen her diarrhea. Should continue with lomotil q6h PRN. I will also make referral to [facility] health here in [place]  to see if they would be willing to evaluate patient, at her request.   She has continued upper abdomina/epigastric pain and ongoing vomiting. Denies GERD symptoms. No dysphagia or odynophagia. EGD 01/2020 was without significant findings to explain her symptoms. Again suspect much of these symptoms are functional and heavily influenced by her mental health co-morbidities. Will trial low dose PPI daily to see if this provides any relief and Phenergan 12.'5mg'$  q8h PRN for vomiting. Recommend she continue with protein shakes BID to maintain adequate nutrition.    PLAN:  Xifaxan '550mg'$  TID x2 weeks 2. Continue lomotil q6h prn 3. Low FODMAP diet 4. [place] psych referral  5. Phenergan 12.'5mg'$  q8h prn 6. Protonix '40mg'$  daily 7. Protein shakes BID  All questions were answered, patient and husband verbalized understanding and are in agreement with plan as outlined above.    Follow Up: 3 months   Donovin Kraemer L. Alver Sorrow, MSN, APRN, AGNP-C Adult-Gerontology Nurse Practitioner [facility]  I have reviewed the note and agree with the APP's assessment as described in this progress note  [name], MD Gastroenterology and Hepatology [facility]

## 2022-03-27 ENCOUNTER — Telehealth (INDEPENDENT_AMBULATORY_CARE_PROVIDER_SITE_OTHER): Payer: Self-pay | Admitting: Internal Medicine

## 2022-03-27 ENCOUNTER — Telehealth (INDEPENDENT_AMBULATORY_CARE_PROVIDER_SITE_OTHER): Payer: Self-pay

## 2022-03-27 NOTE — Telephone Encounter (Signed)
Patient left voice mail. She is breaking out  and can not cope.     Wants a call back. Does not have PCP to go to.    [phone]

## 2022-03-27 NOTE — Telephone Encounter (Signed)
[facility] [address] [place], [place] [address]  Hours of Operations: Address: 5 a.m. - 10 p.m. PT, Monday-Friday [address] 6 a.m. - 3 p.m. PT, [place] [address]  Date: 03/27/2022  To: Scherrie Gerlach From: [facility] Phone: [phone] Phone: [phone] Fax: [phone]   Reference #: [id number] RE: Prior Authorization Request Patient Name: Dawn Kelley Patient DOB: [birth year] Patient ID: [id number] Status of Request: Approve Medication Name: Doreene Nest Tab '550mg'$  GPI/NDC: [id number] Decision Notes: XIFAXAN TAB '550MG'$ , use as directed, is approved through 04/10/2022 under your Medicare Part D benefit. Reviewed by: System If the treating physician would like to discuss this coverage decision with the physician or health care professional reviewer, please call [facility] Prior Authorization department at [phone].

## 2022-03-27 NOTE — Telephone Encounter (Signed)
Patient called today stating she is "freaking out". I asked if she was breaking out in a rash and she said no. She says she is concerned regarding her having issues with diarrhea for the last seven months.   I advised that we were making a referral to new Psych doctor and I was working on the prior [place] on [place] today, and hopefully these things would help her situation. Patient states understanding.

## 2022-03-28 ENCOUNTER — Telehealth: Payer: Self-pay | Admitting: Internal Medicine

## 2022-03-28 NOTE — Telephone Encounter (Signed)
Patient left a voice mail. She has picked up her meds yesterday and she does not know " what is what". Patient wants a call back in regard.  States that if she is unable to come to the phone a voicemail can be left.

## 2022-03-28 NOTE — Telephone Encounter (Signed)
I spoke with the patient and made her aware the xifaxan was what was called in for her.

## 2022-04-02 ENCOUNTER — Telehealth (INDEPENDENT_AMBULATORY_CARE_PROVIDER_SITE_OTHER): Payer: Self-pay | Admitting: *Deleted

## 2022-04-02 ENCOUNTER — Other Ambulatory Visit (INDEPENDENT_AMBULATORY_CARE_PROVIDER_SITE_OTHER): Payer: Self-pay | Admitting: Gastroenterology

## 2022-04-02 MED ORDER — SUCRALFATE 1 GM/10ML PO SUSP: 1.0000 g | Freq: Three times a day (TID) | ORAL | 0 refills | Status: DC

## 2022-04-02 NOTE — Telephone Encounter (Signed)
She said eating does not make it worse and its constant.

## 2022-04-02 NOTE — Telephone Encounter (Signed)
Patient notified

## 2022-04-02 NOTE — Telephone Encounter (Signed)
Patient left message she was having severe abdominal pain and wanted to know what she could take for pain and infection. I called patient to get more information and she reports pain in upper abdomen and bwtween breast. States she had pain at last visit on 11/13 but getting worse. Pain is constant. Feels like pressure. Cold seems to make it worse.

## 2022-04-02 NOTE — Telephone Encounter (Signed)
Patient reports she takes bently four times a day

## 2022-04-10 ENCOUNTER — Telehealth (INDEPENDENT_AMBULATORY_CARE_PROVIDER_SITE_OTHER): Payer: Self-pay

## 2022-04-10 NOTE — Telephone Encounter (Signed)
I spoke with the patient she says [facility] gave a few of her xifaxan, then had to order more, and was not able to complete the treatment consecutively. She still has 6 pills left at [facility] to pick up and take. She last took the medication 2-3 days ago. I advised to please pick up the medication and finish taking it. She says she is still having issues with diarrhea, and she is depressed and does not want to live anymore. I advised that she needed to call her psychiatrist to see what he recommended. I asked if the patient's husband was there and she said yes he was with her. She says she will reach out to her psychiatrist.

## 2022-04-10 NOTE — Telephone Encounter (Signed)
Agree with this, thanks.  

## 2022-04-11 ENCOUNTER — Ambulatory Visit (INDEPENDENT_AMBULATORY_CARE_PROVIDER_SITE_OTHER): Payer: Medicare Other | Admitting: Gastroenterology

## 2022-04-11 ENCOUNTER — Encounter (INDEPENDENT_AMBULATORY_CARE_PROVIDER_SITE_OTHER): Payer: Self-pay | Admitting: Gastroenterology

## 2022-04-11 VITALS — BP 108/72 | HR 62 | Temp 97.3°F | Ht 65.5 in | Wt 195.8 lb

## 2022-04-11 DIAGNOSIS — R1084 Generalized abdominal pain: Secondary | ICD-10-CM

## 2022-04-11 DIAGNOSIS — K582 Mixed irritable bowel syndrome: Secondary | ICD-10-CM | POA: Diagnosis not present

## 2022-04-11 DIAGNOSIS — K580 Irritable bowel syndrome with diarrhea: Secondary | ICD-10-CM

## 2022-04-11 MED ORDER — HYOSCYAMINE SULFATE 0.125 MG SL SUBL: 0.1250 mg | SUBLINGUAL_TABLET | Freq: Three times a day (TID) | SUBLINGUAL | 1 refills | Status: DC | PRN

## 2022-04-11 MED ORDER — ONDANSETRON HCL 4 MG PO TABS: 4.0000 mg | ORAL_TABLET | Freq: Three times a day (TID) | ORAL | 1 refills | Status: DC | PRN

## 2022-04-11 NOTE — Patient Instructions (Addendum)
Stop Bentyl Start Levsin 1 tablet q8h as needed for abdominal pain Start Zofran 4 mg q8h as needed for nausea Take one dose of Lomotil every night If persistent symptoms, may reconsider EGD/colonoscopy

## 2022-04-11 NOTE — Progress Notes (Signed)
[name], M.D. Gastroenterology & Hepatology [facility] [address] [place], [place] [address]  Primary Care Physician: Neale Burly, MD [place] [id number]  I will communicate my assessment and recommendations to the referring MD via EMR.  Problems: Chronic diarrhea IBS-M  History of Present Illness: Tamy Accardo is a 61 y.o. female past medical history of refractory severe anxiety/depression (hx of ECT therapy), bipolar disorder, PTSD, fibromyalgia, CHF, gerd and IBS-M ,who presents for follow up of chronic diarrhea.  The patient was last seen on 03/26/2022. At that time, the patient was given a 2-week course of Xifaxan for IBS-D and was advised to take Lomotil as needed for diarrhea.  She was referred to psychiatry and he was given a prescription for Phenergan.  Was also advised a Protonix 40 mg a day.  Patient reports that for the last 2-3 months she developed worsening  and recurrent episodes of nausea and vomiting. She states whenever she looks at food she feels nauseated. She is not taking any medications for nausea. She states that she has been having frequent episodes of watery to soft Bms, states she has 2-3 Bms per day on average. She states she takes Lomotil but does not remember how often.She also reports having recurrent episodes of having to wake up in the early morning to have a bowel movement, around 2 AM which has been very upsetting for her.  She feels that due to these symptoms "she does not have any will to live".   Patient reports having persistent epigastric pain, which is chronic. She believes she is taking dicyclomine.  She is taking a protein powder every day 1-2 times. Has 3 meals per day but she takes a few bites per day.  The patient denies having any  fever, chills, hematochezia, melena, hematemesis, abdominal distention, jaundice, pruritus.  Weight is stable compared to prior.  CT  abdomen  09/12/21 1. No acute findings. 2. Colon appears diffusely decompressed. Mild wall thickening of the colon likely reflects incomplete distension. Correlate for any clinical signs or symptoms of colitis. 3. Lesion within posterior dome of liver with peripheral discontiguous enhancement measures 1.7 cm. This is unchanged from previous exam and most likely represents a benign hemangioma.  Last EGD: 02/03/2020 - showed multiple small gastric polyps which were sessile fundic gland polyp. Small bowel was biopsied, histology was normal.   Colonoscopy:02/03/20 3 mm polyp in the sigmoid colon that was removed with a cold snare, as well as hemorrhoids, histology was consistent with a tubular adenoma  Past Medical History: Past Medical History:  Diagnosis Date   Anxiety    Bipolar 1 disorder ([place])    Depression    OCD (obsessive compulsive disorder)    PTSD (post-traumatic stress disorder)    S/P ECT (electroconvulsive therapy)     Past Surgical History: Past Surgical History:  Procedure Laterality Date   BIOPSY  02/03/2020   Procedure: BIOPSY;  Surgeon: Harvel Quale, MD;  Location: [facility];  Service: Gastroenterology;;   COLONOSCOPY WITH PROPOFOL N/A 02/03/2020   [name]: 21m polyp in sigmoid colon  as well as hemorrhoids, histology consistent with tubular adenoma   ESOPHAGOGASTRODUODENOSCOPY (EGD) WITH PROPOFOL N/A 02/03/2020   castaneda: small gastric polyps, sessile, and fundic gland, small bowel bx normal   INDUCED ABORTION N/A 1980   POLYPECTOMY  02/03/2020   Procedure: POLYPECTOMY;  Surgeon: CHarvel Quale MD;  Location: [facility];  Service: Gastroenterology;;  VEIN SURGERY      Family History: Family History  Problem Relation Age of Onset   Depression Mother    Pneumonia Mother     Social History: Social History   Tobacco Use  Smoking Status Never  Smokeless Tobacco Never   Social History   Substance and Sexual Activity   Alcohol Use Not Currently   Alcohol/week: 10.0 standard drinks of alcohol   Types: 10 Cans of beer per week   Comment: daily   Social History   Substance and Sexual Activity  Drug Use Yes   Types: Marijuana   Comment: occassional    Allergies: Allergies  Allergen Reactions   Escitalopram Nausea And Vomiting    Tolerated citalopram with out difficulty for many years.   Seroquel [Quetiapine Fumarate] Shortness Of Breath   Trazodone Other (See Comments)    Hives.    Medications: Current Outpatient Medications  Medication Sig Dispense Refill   busPIRone (BUSPAR) 30 MG tablet Take 30 mg by mouth at bedtime.     clonazePAM (KLONOPIN) 0.5 MG tablet Take 0.5 mg by mouth in the morning, at noon, and at bedtime.     dicyclomine (BENTYL) 10 MG capsule Take 1 capsule (10 mg total) by mouth 4 (four) times daily -  before meals and at bedtime. 90 capsule 1   diphenoxylate-atropine (LOMOTIL) 2.5-0.025 MG tablet Take 1 tablet by mouth 4 (four) times daily as needed for diarrhea or loose stools (diarrhea). Prn. 120 tablet 3   levothyroxine (SYNTHROID) 25 MCG tablet Take 25 mcg by mouth daily before breakfast.     losartan (COZAAR) 25 MG tablet Take 25 mg by mouth daily.     mirtazapine (REMERON) 15 MG tablet Take 15 mg by mouth at bedtime.     naltrexone (DEPADE) 50 MG tablet Take by mouth daily.     OLANZapine (ZYPREXA) 7.5 MG tablet Take 7.5 mg by mouth at bedtime.     OVER THE COUNTER MEDICATION Probiotic 1 po Qd per patient.     OVER THE COUNTER MEDICATION B 12 one po QD per patient .     OVER THE COUNTER MEDICATION Vit D 2 1.2/50,000 IU once per week.     pantoprazole (PROTONIX) 40 MG tablet Take 1 tablet (40 mg total) by mouth daily. 30 tablet 3   propranolol (INDERAL) 20 MG tablet Take 20 mg by mouth 3 (three) times daily.     sucralfate (CARAFATE) 1 GM/10ML suspension Take 10 mLs (1 g total) by mouth 4 (four) times daily -  with meals and at bedtime for 10 days. 414 mL 0    topiramate (TOPAMAX) 25 MG capsule Take 25 mg by mouth 2 (two) times daily.     Vilazodone HCl (VIIBRYD) 40 MG TABS Take 40 mg by mouth daily.     promethazine (PHENERGAN) 12.5 MG tablet Take 1 tablet (12.5 mg total) by mouth every 8 (eight) hours as needed for nausea or vomiting. (Patient not taking: Reported on 04/11/2022) 20 tablet 0   No current facility-administered medications for this visit.    Review of Systems: GENERAL: negative for malaise, night sweats HEENT: No changes in hearing or vision, no nose bleeds or other nasal problems. NECK: Negative for lumps, goiter, pain and significant neck swelling RESPIRATORY: Negative for cough, wheezing CARDIOVASCULAR: Negative for chest pain, leg swelling, palpitations, orthopnea GI: SEE HPI MUSCULOSKELETAL: Negative for joint pain or swelling, back pain, and muscle pain. SKIN: Negative for lesions, rash PSYCH: Negative for sleep disturbance,  mood disorder and recent psychosocial stressors. HEMATOLOGY Negative for prolonged bleeding, bruising easily, and swollen nodes. ENDOCRINE: Negative for cold or heat intolerance, polyuria, polydipsia and goiter. NEURO: negative for tremor, gait imbalance, syncope and seizures. The remainder of the review of systems is noncontributory.   Physical Exam: BP 108/72 (BP Location: Left Arm, Patient Position: Sitting, Cuff Size: Large)   Pulse 62   Temp (!) 97.3 F (36.3 C) (Temporal)   Ht 5' 5.5" (1.664 m)   Wt 195 lb 12.8 oz (88.8 kg)   BMI 32.09 kg/m  GENERAL: The patient is AO x3, in no acute distress. HEENT: Head is normocephalic and atraumatic. EOMI are intact. Mouth is well hydrated and without lesions. NECK: Supple. No masses LUNGS: Clear to auscultation. No presence of rhonchi/wheezing/rales. Adequate chest expansion HEART: RRR, normal s1 and s2. ABDOMEN: mildly tender upon palpation of the periumbilical area, no guarding, no peritoneal signs, and nondistended. BS +. No masses. EXTREMITIES:  Without any cyanosis, clubbing, rash, lesions or edema. NEUROLOGIC: AOx3, no focal motor deficit. SKIN: no jaundice, no rashes  Imaging/Labs: as above  I personally reviewed and interpreted the available labs, imaging and endoscopic files.  Impression and Plan: Nur Rabold is a 61 y.o. female past medical history of refractory severe anxiety/depression (hx of ECT therapy), bipolar disorder, PTSD, fibromyalgia, CHF, gerd and IBS-M ,who presents for follow up of chronic diarrhea.  Patient has presented chronic episodes of diarrhea and nausea, vomiting which have now improved with medical treatment.  Diarrhea was initially attributed to C. difficile which was successfully treated and eradicated but she still has some watery to loose bowel movements, some of which wake her up in the middle of the night.  I suspect that her IBS worsened after her severe infection.  Abdominal but she is presenting uncontrolled psychiatric disease as she is very nervous and is very "fearful of something sinister happening soon".  I advised the patient to follow-up closely with her psychiatrist but I also offered repeating endoscopic investigations with random colonic biopsies.  At this time she would like to hold off on this and would like to proceed with pharmacologic management at all.  She will try to take Lomotil every night standing and as needed during the day.  I will also prescribe her Zofran to relieve her nausea episodes which can potentially improve her oral intake.  Finally, she can try switching to Levsin to relieve her abdominal discomfort.  - Stop Bentyl - Start Levsin 1 tablet q8h as needed for abdominal pain - Start Zofran 4 mg q8h as needed for nausea - Take one dose of Lomotil every night, can take more doses as needed every 6 hours - If persistent symptoms, may reconsider EGD/colonoscopy  All questions were answered.      [name], MD Gastroenterology and Hepatology [facility]

## 2022-04-18 ENCOUNTER — Other Ambulatory Visit: Payer: Self-pay | Admitting: Gastroenterology

## 2022-04-18 ENCOUNTER — Telehealth (INDEPENDENT_AMBULATORY_CARE_PROVIDER_SITE_OTHER): Payer: Self-pay

## 2022-04-18 DIAGNOSIS — K582 Mixed irritable bowel syndrome: Secondary | ICD-10-CM

## 2022-04-18 MED ORDER — DIPHENOXYLATE-ATROPINE 2.5-0.025 MG PO TABS: 1.0000 | ORAL_TABLET | Freq: Four times a day (QID) | ORAL | 3 refills | Status: DC | PRN

## 2022-04-18 NOTE — Telephone Encounter (Signed)
I spoke with patient she is aware.

## 2022-04-18 NOTE — Telephone Encounter (Signed)
Patient called here today asking for refill on Prochlorperazine 5 mg that her PCP had gave her she asked for this first then changed her mind wanting lomotil She says she only has #20 of those left and will need refills sent to [facility].She says she takes Lomotil 2-3 times per day, and is having diarrhea 3-4 times per day. Please advise.

## 2022-04-18 NOTE — Telephone Encounter (Signed)
I sent the prescription for Lomotil. Will not refill prochlorperazine as she was just started on Zofran a few days ago.

## 2022-04-19 ENCOUNTER — Telehealth: Payer: Self-pay

## 2022-04-19 NOTE — Telephone Encounter (Signed)
Thanks for the update. Ann, can we send a letter to Dr. Sherrie Sport describing the most recent events. We have advised her to reach her psychiatrist multiple times, she may need an inpatient psychiatric evaluation.

## 2022-04-19 NOTE — Telephone Encounter (Signed)
Patient called the office saying she could not take it any longer she wanted to kill herself per Mindy at [facility], the patient asked to speak with me, and they came to the room and pulled me out. I spoke with the patient and asked what was going on. Patient says she can't do this any longer she can not live with the diarrhea. She pleaded for help and I asked if her husband was with her and asked that he get on the phone. Ed her husband says he was there with her and he was not going any where and would not let her hurt herself. I advised she needed to reach her Psych doctor, she says the doctor will not admit her and then asked if Dr. Jenetta Downer would. I told her we do not admit. Office administrator called [phone] and sent the police and the ambulance to the patient home. I stayed on the phone with her until they arrived.

## 2022-04-20 ENCOUNTER — Encounter (INDEPENDENT_AMBULATORY_CARE_PROVIDER_SITE_OTHER): Payer: Self-pay | Admitting: *Deleted

## 2022-04-20 NOTE — Telephone Encounter (Signed)
Letter approved br Dr Jenetta Downer and faxed to Dr Sherrie Sport

## 2022-04-23 ENCOUNTER — Ambulatory Visit (INDEPENDENT_AMBULATORY_CARE_PROVIDER_SITE_OTHER): Payer: Medicare Other | Admitting: Gastroenterology

## 2022-04-30 NOTE — Telephone Encounter (Signed)
Thanks, she should also reach her psychiatrist, they are the only ones that would have admitting privileges for her anxiety/depression/PTSD

## 2022-04-30 NOTE — Telephone Encounter (Signed)
I made the patient aware she will need to reach out to her psychiatrist. She states understanding.

## 2022-04-30 NOTE — Telephone Encounter (Signed)
Patient called today states she is needing help. She is confused by the way [facility] is packing her medications in a bubble pac. I advised that she would need to call them for clarity. She says she also has had a decreased appetite.She also says she is having a lot of anxiety and she thinks she needs to be hospitalized. She says Dr. Zada Girt will not admit her. I gave the patient the information to the patient for behavioral health to contact them. At [phone] and address is [address] [place]. I advised that she please call them to see if they can help her.

## 2022-05-03 ENCOUNTER — Telehealth (INDEPENDENT_AMBULATORY_CARE_PROVIDER_SITE_OTHER): Payer: Self-pay

## 2022-05-03 NOTE — Telephone Encounter (Signed)
Thanks, she needs to talk with her psychiatrist asap. Very limited options for her, has had multiple investigations and all is negative.

## 2022-05-03 NOTE — Telephone Encounter (Signed)
Patient called today states she has a hard time swallowing and she vomits after eating.  I spoke with the patient and asked if her food was getting stuck in her throat she says yes, and she vomits after eating. She says the vomiting has been on going for the last 7 months as when she wakes every morning she feels nauseous and vomits. She has a decreased appetite. I asked her to please take smaller bites of food, chew thoroughly, take water with every bite. She says she does not know what to do. I advised that her anxiety was likely causing her appetite decrease and she really needed to reach her psychiatrist to discuss her anxiety issues, and once those are under control her stomach issues would be better. Patient states understanding.

## 2022-05-11 ENCOUNTER — Ambulatory Visit: Payer: Medicare Other | Admitting: Family Medicine

## 2022-05-11 ENCOUNTER — Encounter: Payer: Self-pay | Admitting: General Practice

## 2022-05-17 ENCOUNTER — Telehealth (INDEPENDENT_AMBULATORY_CARE_PROVIDER_SITE_OTHER): Payer: Self-pay

## 2022-05-17 NOTE — Telephone Encounter (Signed)
Patient called today states she is now constipated.She says she has been taking Lomotil QID and is now having constipation. Patient says she was able to have a small bm this am,but it was hard. I advised to not take the lomotil for now. Patient states understanding and says something has to be done as she has had gi issues for doing on 7-8 months now and she wakes up heaving and vomiting most nights. I advised that Dr. Jenetta Downer would be back next week and we would discuss further at that time. Patient states understanding.

## 2022-05-24 ENCOUNTER — Telehealth (INDEPENDENT_AMBULATORY_CARE_PROVIDER_SITE_OTHER): Payer: Self-pay

## 2022-05-24 NOTE — Telephone Encounter (Signed)
Please see other phone encounter

## 2022-05-24 NOTE — Telephone Encounter (Signed)
Spoke with the patient today, we discussed thoroughly her symptoms, explained that if she has fluctuation between constipation and diarrhea she needs to titrate her antidiarrheals for her IBS M.  She understood that if she develops diarrhea she needs to take the antidiarrheals but if she skips having a bowel movement any day, she should hold her antidiarrheals. We also discussed that her clonazepam should be managed by her psychiatrist.

## 2022-05-24 NOTE — Telephone Encounter (Signed)
Patient called today stating she has had three years of anxiety and depression, and now she is having issues with swallowing and is unable to keep her food down most of the time she says she vomits it back up. She is having issues with wanting to eat, she has no appetite, and food does not taste good to her.   She also states she ran out of her Clonazepam and the pharmacy would not give her any more as it is too soon to refill. I advised she would have to reach out to her Psychiatrist to get that filled as we would not be doing that.

## 2022-06-06 ENCOUNTER — Telehealth (INDEPENDENT_AMBULATORY_CARE_PROVIDER_SITE_OTHER): Payer: Self-pay

## 2022-06-06 NOTE — Telephone Encounter (Signed)
Thanks for the update, definitely things that should be further evaluated by her PCP and psychiatrist

## 2022-06-06 NOTE — Telephone Encounter (Signed)
Patient called today 06/06/2022 states she needed someone from our office to call her. I called the patient and she states she is not getting any better, she is scared and she can not breath properly. I asked if she thought it was connected to her anxiety and she says she did not know. She also says she was having issues with her equilibrium. I advised that she needed to reach out to her pcp in regards to the equilibrium and can not breath properly. I also advised that she needed to call her Psychiatrist.     06/05/2022: Patient called 06/05/2022 stating she needed help as she had an old 62 year old doctor and she needs someone to help her. I called and patient says her pcp was an older man and he did not listen to her. She needed a new doctor. I advised that I saw her current doctor was Dr. Zada Girt and she has an upcoming office visit with [facility] on 07/12/2022. I gave the patient the appointment information .

## 2022-06-18 ENCOUNTER — Other Ambulatory Visit (INDEPENDENT_AMBULATORY_CARE_PROVIDER_SITE_OTHER): Payer: Self-pay | Admitting: Gastroenterology

## 2022-06-20 ENCOUNTER — Telehealth (INDEPENDENT_AMBULATORY_CARE_PROVIDER_SITE_OTHER): Payer: Self-pay

## 2022-06-20 NOTE — Telephone Encounter (Signed)
Thanks, agree with this

## 2022-06-20 NOTE — Telephone Encounter (Signed)
Patient called today wanting medication sent in for her Anxiety. I advised she will need to reach out to the pcp or psych doctor for this.

## 2022-06-26 ENCOUNTER — Emergency Department (HOSPITAL_COMMUNITY)
Admission: EM | Admit: 2022-06-26 | Discharge: 2022-06-26 | Disposition: A | Discharge status: Home / Self Care | Payer: 59 | Attending: Emergency Medicine | Admitting: Emergency Medicine

## 2022-06-26 ENCOUNTER — Ambulatory Visit (INDEPENDENT_AMBULATORY_CARE_PROVIDER_SITE_OTHER): Payer: Medicare Other | Admitting: Gastroenterology

## 2022-06-26 ENCOUNTER — Other Ambulatory Visit: Payer: Self-pay

## 2022-06-26 DIAGNOSIS — Z79899 Other long term (current) drug therapy: Secondary | ICD-10-CM | POA: Insufficient documentation

## 2022-06-26 DIAGNOSIS — F1210 Cannabis abuse, uncomplicated: Secondary | ICD-10-CM | POA: Insufficient documentation

## 2022-06-26 DIAGNOSIS — E876 Hypokalemia: Secondary | ICD-10-CM | POA: Diagnosis not present

## 2022-06-26 DIAGNOSIS — Y900 Blood alcohol level of less than 20 mg/100 ml: Secondary | ICD-10-CM | POA: Insufficient documentation

## 2022-06-26 DIAGNOSIS — Z0000 Encounter for general adult medical examination without abnormal findings: Secondary | ICD-10-CM | POA: Diagnosis not present

## 2022-06-26 DIAGNOSIS — F419 Anxiety disorder, unspecified: Secondary | ICD-10-CM | POA: Diagnosis present

## 2022-06-26 LAB — COMPREHENSIVE METABOLIC PANEL
ALT: 27 U/L (ref 0–44)
AST: 22 U/L (ref 15–41)
Albumin: 3.9 g/dL (ref 3.5–5.0)
Alkaline Phosphatase: 80 U/L (ref 38–126)
Anion gap: 12 (ref 5–15)
BUN: 12 mg/dL (ref 8–23)
CO2: 20 mmol/L — ABNORMAL LOW (ref 22–32)
Calcium: 9.2 mg/dL (ref 8.9–10.3)
Chloride: 107 mmol/L (ref 98–111)
Creatinine, Ser: 0.73 mg/dL (ref 0.44–1.00)
GFR, Estimated: >60 mL/min (ref >60)
Glucose, Bld: 115 mg/dL — ABNORMAL HIGH (ref 70–99)
Potassium: 3.1 mmol/L — ABNORMAL LOW (ref 3.5–5.1)
Sodium: 139 mmol/L (ref 135–145)
Total Bilirubin: 0.2 mg/dL — ABNORMAL LOW (ref 0.3–1.2)
Total Protein: 7.2 g/dL (ref 6.5–8.1)

## 2022-06-26 LAB — CBC
HCT: 41.7 % (ref 36.0–46.0)
Hemoglobin: 14.3 g/dL (ref 12.0–15.0)
MCH: 30.7 pg (ref 26.0–34.0)
MCHC: 34.3 g/dL (ref 30.0–36.0)
MCV: 89.5 fL (ref 80.0–100.0)
Platelets: 327 10*3/uL (ref 150–400)
RBC: 4.66 MIL/uL (ref 3.87–5.11)
RDW: 13 % (ref 11.5–15.5)
WBC: 10.1 10*3/uL (ref 4.0–10.5)
nRBC: 0 % (ref 0.0–0.2)

## 2022-06-26 LAB — RAPID URINE DRUG SCREEN, HOSP PERFORMED
Amphetamines: NOT DETECTED
Barbiturates: NOT DETECTED
Benzodiazepines: NOT DETECTED
Cocaine: NOT DETECTED
Opiates: NOT DETECTED
Tetrahydrocannabinol: POSITIVE — AB

## 2022-06-26 LAB — ETHANOL: Alcohol, Ethyl (B): <10 mg/dL (ref <10)

## 2022-06-26 LAB — SALICYLATE LEVEL: Salicylate Lvl: <7 mg/dL — ABNORMAL LOW (ref 7.0–30.0)

## 2022-06-26 LAB — ACETAMINOPHEN LEVEL: Acetaminophen (Tylenol), Serum: <10 ug/mL — ABNORMAL LOW (ref 10–30)

## 2022-06-26 MED ORDER — LORAZEPAM 2 MG/ML IJ SOLN
1.0000 mg | Freq: Once | INTRAMUSCULAR | Status: AC
  Administered 2022-06-26: 1 mg via INTRAVENOUS
  Filled 2022-06-26: qty 1

## 2022-06-26 MED ORDER — HYDROXYZINE HCL 25 MG PO TABS: 25.0000 mg | ORAL_TABLET | Freq: Four times a day (QID) | ORAL | 0 refills | Status: DC
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
## 2022-06-26 NOTE — ED Notes (Signed)
Pt denies any SI or HI, she states that she has been very anxious.

## 2022-06-26 NOTE — Discharge Instructions (Addendum)
You were seen in the ER for anxiety.  We gave you a dose of ativan. I'm prescribing you hydroxyzine to use at home.   Please follow up with your behavioral health providers.

## 2022-06-26 NOTE — ED Notes (Signed)
Per PA-C, pt is allowed to leave without being TTS as pt is voluntary and initially only wanted to speak with TTS due to medications. Pt stated the Ativan she was given has helped and now wants to be discharged

## 2022-06-28 ENCOUNTER — Telehealth (INDEPENDENT_AMBULATORY_CARE_PROVIDER_SITE_OTHER): Payer: Self-pay | Admitting: *Deleted

## 2022-06-28 NOTE — Telephone Encounter (Signed)
Patient called and said she had a knot in her stomach from her nerves and wanted to know if Dr. Jenetta Downer could give her something for her nerves. I let her know he does not treat anxiety and asked if she called her psychiatrist and she said yes but they would not give her anything. I asked if she felt like she needed to go to ED. I told her to call her primary care doctor or her psychiatrist to discuss or if she felt she was getting worse to go to ED. She verbalized understanding.

## 2022-06-29 ENCOUNTER — Emergency Department (HOSPITAL_COMMUNITY)
Admission: EM | Admit: 2022-06-29 | Discharge: 2022-06-29 | Disposition: A | Discharge status: Home / Self Care | Payer: 59 | Attending: Student | Admitting: Student

## 2022-06-29 NOTE — ED Triage Notes (Signed)
Pt walked back to room and before pt could be triaged she stated she was leaving "I can't look at these walls all day"; pt encouraged to stay but pt refused  Pt informed that if anything changed with her condition to come back to be seen; pt verbalized understanding

## 2022-07-12 ENCOUNTER — Ambulatory Visit: Payer: 59 | Admitting: Family Medicine

## 2022-07-12 DIAGNOSIS — Z5320 Procedure and treatment not carried out because of patient's decision for unspecified reasons: Secondary | ICD-10-CM | POA: Diagnosis not present

## 2022-07-15 DIAGNOSIS — R079 Chest pain, unspecified: Secondary | ICD-10-CM | POA: Diagnosis not present

## 2022-07-15 DIAGNOSIS — F17200 Nicotine dependence, unspecified, uncomplicated: Secondary | ICD-10-CM | POA: Diagnosis not present

## 2022-07-17 ENCOUNTER — Emergency Department (HOSPITAL_COMMUNITY)
Admission: EM | Admit: 2022-07-17 | Discharge: 2022-07-17 | Disposition: A | Discharge status: Home / Self Care | Payer: 59 | Attending: Emergency Medicine | Admitting: Emergency Medicine

## 2022-07-17 ENCOUNTER — Encounter (HOSPITAL_COMMUNITY): Payer: Self-pay | Admitting: Emergency Medicine

## 2022-07-17 ENCOUNTER — Other Ambulatory Visit: Payer: Self-pay

## 2022-07-17 DIAGNOSIS — R4789 Other speech disturbances: Secondary | ICD-10-CM | POA: Insufficient documentation

## 2022-07-17 DIAGNOSIS — F419 Anxiety disorder, unspecified: Secondary | ICD-10-CM | POA: Insufficient documentation

## 2022-07-17 DIAGNOSIS — F1210 Cannabis abuse, uncomplicated: Secondary | ICD-10-CM | POA: Insufficient documentation

## 2022-07-17 DIAGNOSIS — F1310 Sedative, hypnotic or anxiolytic abuse, uncomplicated: Secondary | ICD-10-CM | POA: Insufficient documentation

## 2022-07-17 DIAGNOSIS — F309 Manic episode, unspecified: Secondary | ICD-10-CM | POA: Diagnosis present

## 2022-07-17 LAB — BASIC METABOLIC PANEL WITH GFR
Anion gap: 10 (ref 5–15)
BUN: 9 mg/dL (ref 8–23)
CO2: 25 mmol/L (ref 22–32)
Calcium: 9.2 mg/dL (ref 8.9–10.3)
Chloride: 105 mmol/L (ref 98–111)
Creatinine, Ser: 0.69 mg/dL (ref 0.44–1.00)
GFR, Estimated: >60 mL/min
Glucose, Bld: 124 mg/dL — ABNORMAL HIGH (ref 70–99)
Potassium: 3.9 mmol/L (ref 3.5–5.1)
Sodium: 140 mmol/L (ref 135–145)

## 2022-07-17 LAB — URINALYSIS, ROUTINE W REFLEX MICROSCOPIC
Bacteria, UA: NONE SEEN
Bilirubin Urine: NEGATIVE
Glucose, UA: NEGATIVE mg/dL
Hgb urine dipstick: NEGATIVE
Ketones, ur: NEGATIVE mg/dL
Nitrite: NEGATIVE
Protein, ur: NEGATIVE mg/dL
Specific Gravity, Urine: 1.002 — ABNORMAL LOW (ref 1.005–1.030)
pH: 7 (ref 5.0–8.0)

## 2022-07-17 LAB — CBC WITH DIFFERENTIAL/PLATELET
Abs Immature Granulocytes: 0.05 10*3/uL (ref 0.00–0.07)
Basophils Absolute: 0 10*3/uL (ref 0.0–0.1)
Basophils Relative: 1 %
Eosinophils Absolute: 0.1 10*3/uL (ref 0.0–0.5)
Eosinophils Relative: 1 %
HCT: 41.9 % (ref 36.0–46.0)
Hemoglobin: 14.2 g/dL (ref 12.0–15.0)
Immature Granulocytes: 1 %
Lymphocytes Relative: 23 %
Lymphs Abs: 2 10*3/uL (ref 0.7–4.0)
MCH: 30.5 pg (ref 26.0–34.0)
MCHC: 33.9 g/dL (ref 30.0–36.0)
MCV: 90.1 fL (ref 80.0–100.0)
Monocytes Absolute: 0.3 10*3/uL (ref 0.1–1.0)
Monocytes Relative: 4 %
Neutro Abs: 6 10*3/uL (ref 1.7–7.7)
Neutrophils Relative %: 70 %
Platelets: 299 10*3/uL (ref 150–400)
RBC: 4.65 MIL/uL (ref 3.87–5.11)
RDW: 12.8 % (ref 11.5–15.5)
WBC: 8.5 10*3/uL (ref 4.0–10.5)
nRBC: 0 % (ref 0.0–0.2)

## 2022-07-17 LAB — RAPID URINE DRUG SCREEN, HOSP PERFORMED
Amphetamines: NOT DETECTED
Barbiturates: NOT DETECTED
Benzodiazepines: POSITIVE — AB
Cocaine: NOT DETECTED
Opiates: NOT DETECTED
Tetrahydrocannabinol: POSITIVE — AB

## 2022-07-17 LAB — ETHANOL: Alcohol, Ethyl (B): <10 mg/dL (ref <10)

## 2022-07-17 MED ORDER — LORAZEPAM 1 MG PO TABS
2.0000 mg | ORAL_TABLET | Freq: Once | ORAL | Status: AC
  Administered 2022-07-17: 2 mg via ORAL
  Filled 2022-07-17: qty 2

## 2022-07-17 NOTE — Discharge Instructions (Signed)
You may return to the emergency department at any time should you desire the psychiatric evaluation you originally requested, then declined.  Follow-up with your counselor/psychiatrist.

## 2022-07-17 NOTE — ED Provider Notes (Signed)
[facility] Provider Note   CSN: [id number] Arrival date & time: 07/17/22  0539     History  Chief Complaint  Patient presents with   Medical Clearance    Dawn Kelley is a 62 y.o. female.  Patient is a 62 year old female with past medical history of generalized anxiety disorder, depression, irritable bowel.  Patient presenting today for evaluation of what she describes as a "nervous breakdown".  She states that she has been having racing thoughts, anxiety, and inability to function, worsening over the past several months.  It is now to the point where her husband cannot leave her alone because she becomes anxious and scared.  She denies to me that she is suicidal or homicidal.  She denies auditory or visual hallucinations, but does describe "racing thoughts".  The history is provided by the patient.       Home Medications Prior to Admission medications   Medication Sig Start Date End Date Taking? Authorizing Provider  busPIRone (BUSPAR) 30 MG tablet Take 10 mg by mouth 3 (three) times daily.   Yes [provider]  dicyclomine (BENTYL) 10 MG capsule Take 10 mg by mouth 4 (four) times daily.   Yes [provider]  hyoscyamine (LEVSIN SL) 0.125 MG SL tablet Place 1 tablet (0.125 mg total) under the tongue every 8 (eight) hours as needed. Patient taking differently: Place 25 tablets under the tongue daily. 04/11/22  Yes Harvel Quale, MD  losartan (COZAAR) 25 MG tablet Take 25 mg by mouth daily.   Yes [provider]  OLANZapine (ZYPREXA) 7.5 MG tablet Take 15 mg by mouth at bedtime.   Yes [provider]  pantoprazole (PROTONIX) 40 MG tablet TAKE 1 TABLET ONCE DAILY. 06/19/22  Yes Carlan, Chelsea L, NP  Vilazodone HCl (VIIBRYD) 40 MG TABS Take 40 mg by mouth daily.   Yes [provider]  clonazePAM (KLONOPIN) 1 MG tablet Take 1 mg by mouth 2 (two) times daily as needed for anxiety.     [provider]  diphenoxylate-atropine (LOMOTIL) 2.5-0.025 MG tablet Take 1 tablet by mouth 4 (four) times daily as needed for diarrhea or loose stools (diarrhea). Prn. Patient not taking: Reported on 06/26/2022 04/18/22   Harvel Quale, MD  hydrOXYzine (ATARAX) 25 MG tablet Take 1 tablet (25 mg total) by mouth every 6 (six) hours. 06/26/22   Roemhildt, Lorin T, PA-C  levothyroxine (SYNTHROID) 25 MCG tablet Take 25 mcg by mouth daily before breakfast.    [provider]  mirtazapine (REMERON) 15 MG tablet Take 15 mg by mouth at bedtime.    [provider]  naltrexone (DEPADE) 50 MG tablet Take 50 mg by mouth daily.    [provider]  ondansetron (ZOFRAN) 4 MG tablet Take 1 tablet (4 mg total) by mouth every 8 (eight) hours as needed for nausea or vomiting. Patient not taking: Reported on 06/26/2022 04/11/22   Harvel Quale, MD  propranolol (INDERAL) 20 MG tablet Take 20 mg by mouth 2 (two) times daily. 03/29/20   [provider]  sucralfate (CARAFATE) 1 GM/10ML suspension Take 10 mLs (1 g total) by mouth 4 (four) times daily -  with meals and at bedtime for 10 days. 04/02/22 04/12/22  Carlan, Deatra Robinson, NP  topiramate (TOPAMAX) 25 MG capsule Take 25 mg by mouth 2 (two) times daily.    [provider]      Allergies    Escitalopram, Seroquel [quetiapine  fumarate], and Trazodone    Review of Systems   Review of Systems  All other systems reviewed and are negative.   Physical Exam Updated Vital Signs Ht '5\' 5"'$  (1.651 m)   Wt 83.9 kg   BMI 30.79 kg/m  Physical Exam Vitals and nursing note reviewed.  Constitutional:      General: She is not in acute distress.    Appearance: She is well-developed. She is not diaphoretic.  HENT:     Head: Normocephalic and atraumatic.  Cardiovascular:     Rate and Rhythm: Normal rate and regular rhythm.     Heart sounds: No murmur heard.    No friction rub. No gallop.   Pulmonary:     Effort: Pulmonary effort is normal. No respiratory distress.     Breath sounds: Normal breath sounds. No wheezing.  Abdominal:     General: Bowel sounds are normal. There is no distension.     Palpations: Abdomen is soft.     Tenderness: There is no abdominal tenderness.  Musculoskeletal:        General: Normal range of motion.     Cervical back: Normal range of motion and neck supple.  Skin:    General: Skin is warm and dry.  Neurological:     General: No focal deficit present.     Mental Status: She is alert and oriented to person, place, and time.  Psychiatric:        Attention and Perception: Attention normal.        Mood and Affect: Mood is anxious. Affect is blunt.        Speech: Speech is rapid and pressured.        Behavior: Behavior is hyperactive.        Thought Content: Thought content does not include homicidal or suicidal ideation. Thought content does not include homicidal or suicidal plan.     ED Results / Procedures / Treatments   Labs (all labs ordered are listed, but only abnormal results are displayed) Labs Reviewed - No data to display  EKG None  Radiology No results found.  Procedures Procedures    Medications Ordered in ED Medications - No data to display  ED Course/ Medical Decision Making/ A&P  Patient presenting with complaints of a "nervous breakdown".  Patient reports racing thoughts and decreased ability to function over the past several months.  Her husband is afraid to leave her by herself.  She denies suicidal or homicidal ideation.  Patient was given oral Ativan here in the ER to help calm her nerves.  She will undergo evaluation by TTS who will assist in the final disposition.  Final Clinical Impression(s) / ED Diagnoses Final diagnoses:  None    Rx / DC Orders ED Discharge Orders     None         Veryl Speak, MD 07/17/22 [phone]

## 2022-07-17 NOTE — ED Notes (Signed)
Pt states that she is not having any SI or HI.  That she is going to leave & follow up with primary psychiatrist. Husband is at bedside & agrees to plan.

## 2022-07-17 NOTE — ED Triage Notes (Signed)
Pt states she is being treated for mental breakdown by psychiatrist. Her last visit being yesterday. Pt states her anxiety is out of the park, she is antsy and cannot sit still or sleep. Pt denies any SI/HI ideations at this time.

## 2022-07-19 DIAGNOSIS — Z20822 Contact with and (suspected) exposure to covid-19: Secondary | ICD-10-CM | POA: Diagnosis not present

## 2022-07-19 DIAGNOSIS — F32A Depression, unspecified: Secondary | ICD-10-CM | POA: Diagnosis not present

## 2022-07-19 DIAGNOSIS — Z1152 Encounter for screening for COVID-19: Secondary | ICD-10-CM | POA: Diagnosis not present

## 2022-07-23 ENCOUNTER — Emergency Department (HOSPITAL_COMMUNITY)
Admission: EM | Admit: 2022-07-23 | Discharge: 2022-07-23 | Discharge status: Against Medical Advice | Payer: 59 | Attending: Emergency Medicine | Admitting: Emergency Medicine

## 2022-07-23 NOTE — ED Triage Notes (Signed)
Pt told registration she could not stay, she needed to leave; pt encouraged to stay but told registration to cut off nameband

## 2022-07-29 DIAGNOSIS — Z1152 Encounter for screening for COVID-19: Secondary | ICD-10-CM | POA: Diagnosis not present

## 2022-07-29 DIAGNOSIS — Z0289 Encounter for other administrative examinations: Secondary | ICD-10-CM | POA: Diagnosis not present

## 2022-07-29 DIAGNOSIS — Z20822 Contact with and (suspected) exposure to covid-19: Secondary | ICD-10-CM | POA: Diagnosis not present

## 2022-07-29 DIAGNOSIS — Z0000 Encounter for general adult medical examination without abnormal findings: Secondary | ICD-10-CM | POA: Diagnosis not present

## 2022-07-30 ENCOUNTER — Telehealth: Payer: Self-pay

## 2022-07-30 NOTE — Telephone Encounter (Signed)
     Patient  visit on 3/11  at [facility] you been able to follow up with your primary care physician? Yes   The patient was or was not able to obtain any needed medicine or equipment. Yes   Are there diet recommendations that you are having difficulty following? Na   Patient expresses understanding of discharge instructions and education provided has no other needs at this time.  Yes     [facility] [phone] [address], [place], [place] [address] Phone: [phone] Email: [email or web address]

## 2022-08-02 ENCOUNTER — Telehealth (INDEPENDENT_AMBULATORY_CARE_PROVIDER_SITE_OTHER): Payer: Self-pay

## 2022-08-02 ENCOUNTER — Other Ambulatory Visit (INDEPENDENT_AMBULATORY_CARE_PROVIDER_SITE_OTHER): Payer: Self-pay | Admitting: Gastroenterology

## 2022-08-02 DIAGNOSIS — K582 Mixed irritable bowel syndrome: Secondary | ICD-10-CM

## 2022-08-02 MED ORDER — DIPHENOXYLATE-ATROPINE 2.5-0.025 MG PO TABS: 1.0000 | ORAL_TABLET | Freq: Four times a day (QID) | ORAL | 1 refills | Status: DC | PRN

## 2022-08-02 NOTE — Telephone Encounter (Signed)
Patient made aware of all and that the medication had been submitted to pharmacy. If she does not have the lomotil for diarrhea I can send a refill of this, she needs to take her protonix if she still has it. Can continue to use zofran for her nausea and vomiting.

## 2022-08-02 NOTE — Telephone Encounter (Signed)
Patient calling today saying she is waking up every morning with nausea vomiting and having diarrhea, she says she is taking ondansetron Q 6 hours prn.She says she has ran out of Bentyl and has not taken her pantoprazole, says she does not have any medications called Diphenoxylate, or carafate. Patient wants to know if she needs any testing done or if she needs any medications sent in as she uses [facility]. Please advise

## 2022-08-02 NOTE — Telephone Encounter (Signed)
Per patient she does not have any lomotil, please send in for her thanks

## 2022-08-05 ENCOUNTER — Other Ambulatory Visit: Payer: Self-pay

## 2022-08-05 ENCOUNTER — Emergency Department (HOSPITAL_COMMUNITY)
Admission: EM | Admit: 2022-08-05 | Discharge: 2022-08-05 | Disposition: A | Discharge status: Home / Self Care | Payer: 59 | Attending: Emergency Medicine | Admitting: Emergency Medicine

## 2022-08-05 DIAGNOSIS — F419 Anxiety disorder, unspecified: Secondary | ICD-10-CM

## 2022-08-05 DIAGNOSIS — R1011 Right upper quadrant pain: Secondary | ICD-10-CM | POA: Diagnosis not present

## 2022-08-05 LAB — URINALYSIS, ROUTINE W REFLEX MICROSCOPIC
Bilirubin Urine: NEGATIVE
Glucose, UA: NEGATIVE mg/dL
Hgb urine dipstick: NEGATIVE
Ketones, ur: NEGATIVE mg/dL
Leukocytes,Ua: NEGATIVE
Nitrite: NEGATIVE
Protein, ur: NEGATIVE mg/dL
Specific Gravity, Urine: 1.001 — ABNORMAL LOW (ref 1.005–1.030)
pH: 7 (ref 5.0–8.0)

## 2022-08-05 LAB — CBC WITH DIFFERENTIAL/PLATELET
Abs Immature Granulocytes: 0.03 10*3/uL (ref 0.00–0.07)
Basophils Absolute: 0 10*3/uL (ref 0.0–0.1)
Basophils Relative: 0 %
Eosinophils Absolute: 0.1 10*3/uL (ref 0.0–0.5)
Eosinophils Relative: 1 %
HCT: 42.1 % (ref 36.0–46.0)
Hemoglobin: 14.4 g/dL (ref 12.0–15.0)
Immature Granulocytes: 0 %
Lymphocytes Relative: 30 %
Lymphs Abs: 2.4 10*3/uL (ref 0.7–4.0)
MCH: 30.4 pg (ref 26.0–34.0)
MCHC: 34.2 g/dL (ref 30.0–36.0)
MCV: 89 fL (ref 80.0–100.0)
Monocytes Absolute: 0.4 10*3/uL (ref 0.1–1.0)
Monocytes Relative: 5 %
Neutro Abs: 5.1 10*3/uL (ref 1.7–7.7)
Neutrophils Relative %: 64 %
Platelets: 296 10*3/uL (ref 150–400)
RBC: 4.73 MIL/uL (ref 3.87–5.11)
RDW: 13 % (ref 11.5–15.5)
WBC: 8 10*3/uL (ref 4.0–10.5)
nRBC: 0 % (ref 0.0–0.2)

## 2022-08-05 LAB — COMPREHENSIVE METABOLIC PANEL
ALT: 37 U/L (ref 0–44)
AST: 27 U/L (ref 15–41)
Albumin: 3.7 g/dL (ref 3.5–5.0)
Alkaline Phosphatase: 86 U/L (ref 38–126)
Anion gap: 8 (ref 5–15)
BUN: 8 mg/dL (ref 8–23)
CO2: 26 mmol/L (ref 22–32)
Calcium: 9.2 mg/dL (ref 8.9–10.3)
Chloride: 107 mmol/L (ref 98–111)
Creatinine, Ser: 0.53 mg/dL (ref 0.44–1.00)
GFR, Estimated: >60 mL/min (ref >60)
Glucose, Bld: 97 mg/dL (ref 70–99)
Potassium: 3.9 mmol/L (ref 3.5–5.1)
Sodium: 141 mmol/L (ref 135–145)
Total Bilirubin: 0.5 mg/dL (ref 0.3–1.2)
Total Protein: 7.2 g/dL (ref 6.5–8.1)

## 2022-08-05 LAB — LIPASE, BLOOD: Lipase: 33 U/L (ref 11–51)

## 2022-08-05 MED ORDER — LORAZEPAM 2 MG/ML IJ SOLN
1.0000 mg | Freq: Once | INTRAMUSCULAR | Status: AC
  Administered 2022-08-05: 1 mg via INTRAVENOUS
  Filled 2022-08-05: qty 1

## 2022-08-05 MED ORDER — DIAZEPAM 5 MG/ML IJ SOLN
2.5000 mg | Freq: Once | INTRAMUSCULAR | Status: AC
  Administered 2022-08-05: 2.5 mg via INTRAVENOUS
  Filled 2022-08-05: qty 2

## 2022-08-05 MED ORDER — ONDANSETRON HCL 4 MG/2ML IJ SOLN
4.0000 mg | Freq: Once | INTRAMUSCULAR | Status: AC
  Administered 2022-08-05: 4 mg via INTRAVENOUS
  Filled 2022-08-05: qty 2

## 2022-08-05 NOTE — ED Notes (Signed)
Pt states she has been waking between 2-4 every am sick. Been seeing same psychiatrist for this time period and her meds have been changed accordingly, but she states they are not working well as she has more anxiety than she did before treatment began with present psychiatrist.

## 2022-08-05 NOTE — ED Triage Notes (Signed)
Pt states she is having a mental breakdown. When she gives examples of issues she says she can't take a shower alone. Husband said she is anxious .

## 2022-08-05 NOTE — ED Notes (Signed)
Pt. Spoke with psychiatrist last month and has an appt. Tomorrow 3/25

## 2022-08-05 NOTE — ED Notes (Signed)
EDP in room with pt 
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
## 2022-08-05 NOTE — ED Notes (Signed)
Administered Valium per order. Pt in room concerned about the welfare of her dogs at home and wondering when she will be released. Encouraged pt to try to remain calm as she has to be well herself in order to properly care for others.

## 2022-08-05 NOTE — Discharge Instructions (Signed)
Your blood tests today are ok with no evidence of your nausea and diarrhea being caused by an acute infection - I suspect this may be your ibs and probably worsened by your anxiety.  I am prescribing you some nausea medicine to take before bedtime to help keep you from waking with this symptom late at night.  I do recommend keeping your appointment tomorrow with your current provider.

## 2022-08-05 NOTE — ED Notes (Signed)
Administered med to pt. Pt concerned about next steps, but is resting with family at bedside presently

## 2022-08-07 DIAGNOSIS — Z5329 Procedure and treatment not carried out because of patient's decision for other reasons: Secondary | ICD-10-CM | POA: Diagnosis not present

## 2022-08-07 DIAGNOSIS — R064 Hyperventilation: Secondary | ICD-10-CM | POA: Diagnosis not present

## 2022-08-07 DIAGNOSIS — R079 Chest pain, unspecified: Secondary | ICD-10-CM | POA: Diagnosis not present

## 2022-08-07 DIAGNOSIS — E785 Hyperlipidemia, unspecified: Secondary | ICD-10-CM | POA: Diagnosis not present

## 2022-08-07 DIAGNOSIS — I10 Essential (primary) hypertension: Secondary | ICD-10-CM | POA: Diagnosis not present

## 2022-08-07 DIAGNOSIS — E079 Disorder of thyroid, unspecified: Secondary | ICD-10-CM | POA: Diagnosis not present

## 2022-08-07 DIAGNOSIS — Z743 Need for continuous supervision: Secondary | ICD-10-CM | POA: Diagnosis not present

## 2022-08-20 DIAGNOSIS — Z780 Asymptomatic menopausal state: Secondary | ICD-10-CM | POA: Diagnosis not present

## 2022-08-20 DIAGNOSIS — M810 Age-related osteoporosis without current pathological fracture: Secondary | ICD-10-CM | POA: Diagnosis not present

## 2022-08-28 ENCOUNTER — Encounter: Payer: Self-pay | Admitting: Family Medicine

## 2022-08-28 ENCOUNTER — Ambulatory Visit (INDEPENDENT_AMBULATORY_CARE_PROVIDER_SITE_OTHER): Payer: 59 | Admitting: Family Medicine

## 2022-08-28 VITALS — BP 117/79 | HR 72 | Ht 65.0 in | Wt 195.0 lb

## 2022-08-28 DIAGNOSIS — K580 Irritable bowel syndrome with diarrhea: Secondary | ICD-10-CM

## 2022-08-28 DIAGNOSIS — R110 Nausea: Secondary | ICD-10-CM

## 2022-08-28 DIAGNOSIS — F339 Major depressive disorder, recurrent, unspecified: Secondary | ICD-10-CM

## 2022-08-28 DIAGNOSIS — R1115 Cyclical vomiting syndrome unrelated to migraine: Secondary | ICD-10-CM

## 2022-08-28 DIAGNOSIS — R1013 Epigastric pain: Secondary | ICD-10-CM

## 2022-08-28 DIAGNOSIS — F1990 Other psychoactive substance use, unspecified, uncomplicated: Secondary | ICD-10-CM

## 2022-08-28 DIAGNOSIS — R197 Diarrhea, unspecified: Secondary | ICD-10-CM | POA: Diagnosis not present

## 2022-08-28 DIAGNOSIS — F419 Anxiety disorder, unspecified: Secondary | ICD-10-CM

## 2022-08-28 MED ORDER — ONDANSETRON HCL 4 MG PO TABS
4.0000 mg | ORAL_TABLET | Freq: Three times a day (TID) | ORAL | 0 refills | Status: DC | PRN
Start: 2022-08-28 — End: 2023-08-28

## 2022-08-28 NOTE — Progress Notes (Signed)
Acute Office Visit  Subjective:  Patient ID: Dawn Kelley, female    DOB: [birth year], 62 y.o.   MRN: [id number]  Chief Complaint  Patient presents with   New Patient (Initial Visit)    Mood disorder, bi-polar, PTSD, OCD   HPI Patient was previously seen by Dr. Olena Leatherwood but no longer wants to be seen by him. Would like to establish.  Currently has N/V, loss of appetite, and epigastric pain  States that she was treated for C.diff last summer  States that she has to force herself to eat. She has episodes of emesis after eating and taking her medication. Episodes occur throughout the day, are worse in the am.  Does not notice anything that makes it better or worse.  States that the Lomital helps with her gastric pain. Took  Endorses diarrhea 1-2 times per day. Has 2-3 BM's per day.  Denies fever and travel  Uses marijuana daily for anxiety, has been using for a long time.  Patient states that the most she goes without marijuana is one half a day.   ROS As per HPI  Objective:  BP 117/79   Pulse 72   Ht  (1.651 m)   Wt 195 lb (88.5 kg)   SpO2 97%   BMI 32.45 kg/m   Physical Exam Constitutional:      General: She is awake.     Appearance: She is well-developed, well-groomed and overweight. She is diaphoretic. She is not ill-appearing or toxic-appearing.  Eyes:     General: Lids are normal. Gaze aligned appropriately.  Cardiovascular:     Rate and Rhythm: Normal rate and regular rhythm.     Pulses: Normal pulses.  Pulmonary:     Effort: Pulmonary effort is normal.     Breath sounds: Normal breath sounds. No decreased breath sounds, wheezing, rhonchi or rales.  Abdominal:     General: Abdomen is flat. Bowel sounds are normal.     Palpations: Abdomen is soft. There is no shifting dullness, fluid wave, hepatomegaly, splenomegaly, mass or pulsatile mass.     Tenderness: There is abdominal tenderness in the right upper quadrant and epigastric area. There is no guarding or  rebound.     Hernia: No hernia is present.  Neurological:     General: No focal deficit present.     Mental Status: She is alert and oriented to person, place, and time. Mental status is at baseline.     GCS: GCS eye subscore is 4. GCS verbal subscore is 5. GCS motor subscore is 6.     Motor: Tremor present.     Coordination: Coordination is intact.     Gait: Gait is intact.  Psychiatric:        Attention and Perception: Attention and perception normal.        Mood and Affect: Mood is anxious. Affect is tearful.        Cognition and Memory: Cognition and memory normal.     Comments: Hyperfixated on symptoms        08/28/2022    2:32 PM 09/05/2020    2:43 PM 02/27/2020    6:54 PM  Depression screen PHQ 2/9  Decreased Interest 3 3   Down, Depressed, Hopeless 3 3   PHQ - 2 Score 6 6   Altered sleeping 3 3   Tired, decreased energy 3 3   Change in appetite 3 3   Feeling bad or failure about yourself  3 3   Trouble  concentrating 3 3   Moving slowly or fidgety/restless 3 3   Suicidal thoughts 0 0   PHQ-9 Score 24 24   Difficult doing work/chores Extremely dIfficult       Information is confidential and restricted. Go to Review Flowsheets to unlock data.      08/28/2022    2:32 PM 09/05/2020    2:44 PM 02/27/2020    6:54 PM  GAD 7 : Generalized Anxiety Score  Nervous, Anxious, on Edge 3 3   Control/stop worrying 3 3   Worry too much - different things 3 3   Trouble relaxing 3 3   Restless 3 3   Easily annoyed or irritable 3 3   Afraid - awful might happen 3 3   Total GAD 7 Score 21 21   Anxiety Difficulty Very difficult       Information is confidential and restricted. Go to Review Flowsheets to unlock data.    Assessment & Plan:  Persistent recurrent vomiting Nausea Epigastric pain Irritable bowel syndrome with diarrhea Patient has been followed by GI for this issue that is persistent. She has a history of IBS and GERD. Per 04/11/22 GI note she was instructed to  follow closely with psychiatry for symptoms. She was offered repeat EGD/Colonoscopy procedures. She declined at that time. She was prescribed Lomotil every night and as needed during the day. She was also instructed to take zofran and Levsin. Patient reports that she is not taking zofran. Reviewed EKG. There is no evidence of QT prolongation, will refill zofran as below until patient can get back in with GI. Considered ordering imaging for patient's symptoms. However, per notes, GI recommended scopes for continued symptoms. Discussed with patient that she will need to follow up with GI.  - ondansetron (ZOFRAN) 4 MG tablet; Take 1 tablet (4 mg total) by mouth every 8 (eight) hours as needed for nausea or vomiting.  Dispense: 90 tablet; Refill: 0  Diarrhea, unspecified type She was treated last year for C. Diff. She is very concerned that the C. Diff has recurred. Labs as below. Will communicate results to patient once available.  Provided patient with hat and stool collection kit.  - Clostridium difficile EIA  Depression, recurrent Anxiety Patient symptoms are not controlled. She has had multiple ED visits for anxiety and mania in the past month.  She is very tearful, very anxious and hyperfixated that something "awful is going to happen". She states that she would like to start seeing a new psychiatrist. She needs close follow up and management of medications. She denies SI/HI. Educated patient on the [facility] resource.  - Ambulatory referral to Psychiatry  Substance use disorder Patient admits to daily use of marijuana. She states that she does not go more than half a day without using marijuana. Discussed with patient the differential diagnosis of Cannabinoid Hyperemesis Syndrome. Provided patient with written educational content. Discussed at length with patient that if she is self-medicating for her psychiatric conditions, then her symptoms are not controlled and she needs  to follow with her psychiatrist to obtain control of her psychiatric conditions. Referral placed as below. Of note, patient was seen at [facility] ED 08/07/22. Patient filled her clonazepam 1 mg on 07/20/22 and had completed her 30 day supply by 08/07/22. She then left AMA.    Patient is available for CCM, will discuss with patient at future visit.   The above assessment and management plan was discussed with the patient.  The patient verbalized understanding of and has agreed to the management plan using shared-decision making. Patient is aware to call the clinic if they develop any new symptoms or if symptoms fail to improve or worsen. Patient is aware when to return to the clinic for a follow-up visit. Patient educated on when it is appropriate to go to the emergency department.   Neale Burly, DNP-FNP [facility] [address] [place], [place] [address] [phone]

## 2022-08-28 NOTE — Addendum Note (Signed)
Addended by: Neale Burly on: 08/28/2022 03:59 PM   Modules accepted: Orders

## 2022-08-29 ENCOUNTER — Other Ambulatory Visit: Payer: 59

## 2022-08-29 DIAGNOSIS — R197 Diarrhea, unspecified: Secondary | ICD-10-CM | POA: Diagnosis not present

## 2022-08-31 LAB — CLOSTRIDIUM DIFFICILE EIA: C difficile Toxins A+B, EIA: NEGATIVE

## 2022-09-06 ENCOUNTER — Ambulatory Visit (INDEPENDENT_AMBULATORY_CARE_PROVIDER_SITE_OTHER): Payer: 59 | Admitting: Gastroenterology

## 2022-09-07 ENCOUNTER — Encounter (INDEPENDENT_AMBULATORY_CARE_PROVIDER_SITE_OTHER): Payer: Self-pay | Admitting: *Deleted

## 2022-09-10 ENCOUNTER — Encounter (INDEPENDENT_AMBULATORY_CARE_PROVIDER_SITE_OTHER): Payer: Self-pay | Admitting: Gastroenterology

## 2022-09-10 ENCOUNTER — Other Ambulatory Visit (INDEPENDENT_AMBULATORY_CARE_PROVIDER_SITE_OTHER): Payer: Self-pay | Admitting: Gastroenterology

## 2022-09-10 ENCOUNTER — Ambulatory Visit (INDEPENDENT_AMBULATORY_CARE_PROVIDER_SITE_OTHER): Payer: 59 | Admitting: Gastroenterology

## 2022-09-10 VITALS — BP 100/70 | HR 97 | Temp 97.9°F | Ht 65.0 in | Wt 199.9 lb

## 2022-09-10 DIAGNOSIS — R112 Nausea with vomiting, unspecified: Secondary | ICD-10-CM

## 2022-09-10 DIAGNOSIS — R197 Diarrhea, unspecified: Secondary | ICD-10-CM | POA: Diagnosis not present

## 2022-09-10 DIAGNOSIS — K582 Mixed irritable bowel syndrome: Secondary | ICD-10-CM

## 2022-09-10 DIAGNOSIS — F54 Psychological and behavioral factors associated with disorders or diseases classified elsewhere: Secondary | ICD-10-CM

## 2022-09-10 DIAGNOSIS — R1013 Epigastric pain: Secondary | ICD-10-CM | POA: Diagnosis not present

## 2022-09-10 MED ORDER — SUCRALFATE 1 GM/10ML PO SUSP: 1.0000 g | Freq: Four times a day (QID) | ORAL | 1 refills | Status: DC

## 2022-09-10 MED ORDER — DIPHENOXYLATE-ATROPINE 2.5-0.025 MG PO TABS
1.0000 | ORAL_TABLET | Freq: Four times a day (QID) | ORAL | 1 refills | Status: DC | PRN
Start: 2022-09-10 — End: 2022-11-14

## 2022-09-10 NOTE — Progress Notes (Unsigned)
Referring Provider: Arrie Senate* Primary Care Physician:  Arrie Senate, FNP Primary GI Physician: Levon Hedger   Chief Complaint  Patient presents with   Emesis    Nasuea and vomiting. Reports every day for 8 -9 months. Wakes her up every morning   HPI:   Dawn Kelley is a 62 y.o. female with past medical history of efractory severe anxiety/depression (hx of ECT therapy), bipolar disorder, PTSD, fibromyalgia, CHF, gerd and IBS-M   Patient presenting today for nausea/vomiting, diarrhea.  Last seen November 2023, reporting for the last 2-3 months she developed worsening  and recurrent episodes of nausea and vomiting. She had previously been given a 2-week course of Xifaxan for IBS-D and was advised to take Lomotil as needed for diarrhea.  She was referred to psychiatry and he was given a prescription for Phenergan.  Was also advised a Protonix 40 mg a day. At last OV She states whenever she looks at food she feels nauseated. She is not taking any medications for nausea. She states that she has been having frequent episodes of watery to soft Bms, states she has 2-3 Bms per day on average. She states she takes Lomotil but does not remember how often.She also reports having recurrent episodes of having to wake up in the early morning to have a bowel movement, around 2 AM which has been very upsetting for her.  She feels that due to these symptoms "she does not have any will to live". Patient reports having persistent epigastric pain, which is chronic. She believes she is taking dicyclomine.   Recommended to stop bentyl, start levsin q8h, lomotil nightly and every 6 hours PRN, zofran 4mg  q8h PRN for nausea, reconsider EGD/colonoscopy for further evaluation if symptoms persists.  Notably also with multiple telephone interactions between staff and patient between last visit and now, patient has been recommend to have further evaluation/management of her symptoms with psychiatry as  it is thought that her GI symptoms are psychosomatic. GI evaluation has been extensive and without overt cause of her n/v/diarrhea.   Present:  She notes she continues to have a lot of issues with nausea/vomiting and diarrhea. She is not taking lomotil for diarrhea, she thinks she ran out of this. She thinks she took levsin but they made her feel dizzy so she did not take anymore. She is taking zofran twice a day. She feels that this helps some. She continues to have some epigastric pain. This pain is mostly constant, nothing that even drinking water makes it hurt. Sometimes eating can improve pain and other times it may make it better. She is vomiting 2-3x/day. Appetite is not good. She is not doing any additional protein sources. Her weight is actually up from march. Does not think xifaxan Improved her symptoms.   She is having 3-6 BMs per day, stools are watery very loose. She has not had a solid stool in some time. She denies BRBPR or melena, notes dark stools only when taking pepto bismol. She is taking this 1-2x/day. She notes improvement in her epigastric discomfort when she takes this. She is unsure if protonix has helped her symptoms at all. She denies very rare NSAID use. She has used carafate in the past but she cannot remember if this provided any relief of her symptoms.   She is taking magnesium oxide daily, states that she was advised by a friend to take this.  She had C diff testing on 4/17 that was negative, TSH 3.583 on 3/26  CT abdomen  09/12/21 1. No acute findings. 2. Colon appears diffusely decompressed. Mild wall thickening of the colon likely reflects incomplete distension. Correlate for any clinical signs or symptoms of colitis. 3. Lesion within posterior dome of liver with peripheral discontiguous enhancement measures 1.7 cm. This is unchanged from previous exam and most likely represents a benign hemangioma.  Last EGD: 02/03/2020 - showed multiple small gastric polyps which  were sessile fundic gland polyp. Small bowel was biopsied, histology was normal.   Colonoscopy:02/03/20 3 mm polyp in the sigmoid colon that was removed with a cold snare, as well as hemorrhoids, histology was consistent with a tubular adenoma     Past Medical History:  Diagnosis Date   Anxiety    Bipolar 1 disorder (HCC)    Depression    OCD (obsessive compulsive disorder)    PTSD (post-traumatic stress disorder)    S/P ECT (electroconvulsive therapy)     Past Surgical History:  Procedure Laterality Date   BIOPSY  02/03/2020   Procedure: BIOPSY;  Surgeon: Dolores Frame, MD;  [facility]: [facility];  Service: Gastroenterology;;   COLONOSCOPY WITH PROPOFOL N/A 02/03/2020   [name]: 3mm polyp in sigmoid colon  as well as hemorrhoids, histology consistent with tubular adenoma   ESOPHAGOGASTRODUODENOSCOPY (EGD) WITH PROPOFOL N/A 02/03/2020   castaneda: small gastric polyps, sessile, and fundic gland, small bowel bx normal   INDUCED ABORTION N/A 1980   POLYPECTOMY  02/03/2020   Procedure: POLYPECTOMY;  Surgeon: Marguerita Merles, Reuel Boom, MD;  Location: [facility];  Service: Gastroenterology;;   VEIN SURGERY      Current Outpatient Medications  Medication Sig Dispense Refill   busPIRone (BUSPAR) 30 MG tablet Take 10 mg by mouth 3 (three) times daily.     Cholecalciferol (SM VITAMIN D3) 100 MCG (4000 UT) CAPS Take by mouth.     clonazePAM (KLONOPIN) 1 MG tablet Take 1 mg by mouth 2 (two) times daily as needed for anxiety.     cyanocobalamin (VITAMIN B12) 1000 MCG tablet Take 1,000 mcg by mouth daily.     dicyclomine (BENTYL) 10 MG capsule Take 10 mg by mouth 4 (four) times daily. (Patient not taking: Reported on 08/28/2022)     diphenoxylate-atropine (LOMOTIL) 2.5-0.025 MG tablet Take 1 tablet by mouth 4 (four) times daily as needed for diarrhea or loose stools (diarrhea). Prn. 120 tablet 1   famotidine (PEPCID) 20 MG tablet Take 20 mg by mouth 2 (two) times daily.      hydrOXYzine (ATARAX) 25 MG tablet Take 1 tablet (25 mg total) by mouth every 6 (six) hours. 12 tablet 0   hydrOXYzine (VISTARIL) 25 MG capsule Take by mouth.     hyoscyamine (LEVSIN SL) 0.125 MG SL tablet Place 1 tablet (0.125 mg total) under the tongue every 8 (eight) hours as needed. (Patient not taking: Reported on 08/28/2022) 90 tablet 1   levothyroxine (SYNTHROID) 25 MCG tablet Take 25 mcg by mouth daily before breakfast.     losartan (COZAAR) 25 MG tablet Take 25 mg by mouth daily.     magnesium oxide (MAG-OX) 400 (240 Mg) MG tablet Take 400 mg by mouth daily.     mirtazapine (REMERON) 45 MG tablet Take 45 mg by mouth at bedtime.     naltrexone (DEPADE) 50 MG tablet Take 50 mg by mouth daily. (Patient not taking: Reported on 08/28/2022)     OLANZapine (ZYPREXA) 15 MG tablet Take 15 mg by mouth at bedtime.     ondansetron (ZOFRAN)  4 MG tablet Take 1 tablet (4 mg total) by mouth every 8 (eight) hours as needed for nausea or vomiting. 90 tablet 0   pantoprazole (PROTONIX) 40 MG tablet TAKE 1 TABLET ONCE DAILY. 90 tablet 1   propranolol (INDERAL) 20 MG tablet Take 20 mg by mouth 2 (two) times daily.     sucralfate (CARAFATE) 1 GM/10ML suspension Take 10 mLs (1 g total) by mouth 4 (four) times daily -  with meals and at bedtime for 10 days. 414 mL 0   topiramate (TOPAMAX) 25 MG capsule Take 25 mg by mouth 2 (two) times daily.     Vilazodone HCl (VIIBRYD) 40 MG TABS Take 40 mg by mouth daily.     No current facility-administered medications for this visit.    Allergies as of 09/10/2022 - Review Complete 08/28/2022  Allergen Reaction Noted   Escitalopram Nausea And Vomiting 12/28/2019   Seroquel [quetiapine fumarate] Shortness Of Breath 09/26/2019   Trazodone Other (See Comments) 02/15/2020    Family History  Problem Relation Age of Onset   Depression Mother    Pneumonia Mother     Social History   Socioeconomic History   Marital status: Married    Spouse name: Not on file   Number  of children: Not on file   Years of education: Not on file   Highest education level: Not on file  Occupational History   Occupation: Disabled  Tobacco Use   Smoking status: Never   Smokeless tobacco: Never  Vaping Use   Vaping Use: Never used  Substance and Sexual Activity   Alcohol use: Not Currently    Alcohol/week: 10.0 standard drinks of alcohol    Types: 10 Cans of beer per week    Comment: daily   Drug use: Yes    Types: Marijuana    Comment: occassional   Sexual activity: Not Currently    Birth control/protection: Post-menopausal  Other Topics Concern   Not on file  Social History Narrative   Pt lives in [place] with husband.  She is on disability.  Pt stated that she receives outpatient psychiatry services through [facility].  She does not receive outpatient therapy.   Social Determinants of Health   Financial Resource Strain: Low Risk  (09/05/2020)   Overall Financial Resource Strain (CARDIA)    Difficulty of Paying Living Expenses: Not hard at all  Food Insecurity: No Food Insecurity (09/05/2020)   Hunger Vital Sign    Worried About Running Out of Food in the Last Year: Never true    Ran Out of Food in the Last Year: Never true  Transportation Needs: No Transportation Needs (09/05/2020)   PRAPARE - Administrator, Civil Service (Medical): No    Lack of Transportation (Non-Medical): No  Physical Activity: Inactive (09/05/2020)   Exercise Vital Sign    Days of Exercise per Week: 0 days    Minutes of Exercise per Session: 0 min  Stress: Unknown (09/05/2020)   [employer] of Occupational Health - Occupational Stress Questionnaire    Feeling of Stress : Patient declined  Social Connections: Unknown (09/05/2020)   Social Connection and Isolation Panel [NHANES]    Frequency of Communication with Friends and Family: More than three times a week    Frequency of Social Gatherings with Friends and Family: Once a week    Attends Religious Services:  Never    Database administrator or Organizations: No    Attends Banker Meetings: Never  Marital Status: Patient declined   Review of systems General: negative for malaise, night sweats, fever, chills, weight loss Neck: Negative for lumps, goiter, pain and significant neck swelling Resp: Negative for cough, wheezing, dyspnea at rest CV: Negative for chest pain, leg swelling, palpitations, orthopnea GI: denies melena, hematochezia, constipation, dysphagia, odyonophagia, early satiety or unintentional weight loss. +nausea/vomiting +diarrhea +epigastric pain  MSK: Negative for joint pain or swelling, back pain, and muscle pain. Derm: Negative for itching or rash Psych: Denies anxiety, memory loss, confusion. +depression No homicidal or suicidal ideation.  Heme: Negative for prolonged bleeding, bruising easily, and swollen nodes. Endocrine: Negative for cold or heat intolerance, polyuria, polydipsia and goiter. Neuro: negative for tremor, gait imbalance, syncope and seizures. The remainder of the review of systems is noncontributory.  Physical Exam: There were no vitals taken for this visit. General:   Alert and oriented. No distress noted. Pleasant and cooperative.  Head:  Normocephalic and atraumatic. Eyes:  Conjuctiva clear without scleral icterus. Mouth:  Oral mucosa pink and moist. Good dentition. No lesions. Heart: Normal rate and rhythm, s1 and s2 heart sounds present.  Lungs: Clear lung sounds in all lobes. Respirations equal and unlabored. Abdomen:  +BS, soft, and non-distended. TTP of epigastric region. No rebound or guarding. No HSM or masses noted. Derm: No palmar erythema or jaundice Msk:  Symmetrical without gross deformities. Normal posture. Extremities:  Without edema. Neurologic:  Alert and  oriented x4 Psych:  Alert and cooperative. Somewhat flat affect though appearing much less anxious that previous visits.   Invalid input(s): "6 MONTHS"    ASSESSMENT: Dawn Kelley is a 62 y.o. female presenting today for follow up of nausea, vomiting and diarrhea.  Patient continue with nausea/vomiting and epigastric pain, reports eating can make pain better, sometimes worse, no melena. Appetite is not good. Does not think addition of protonix helped her pain, last EGD was in 2021, would recommend proceeding with EGD for further evaluation of her symptoms as I cannot rule out PUD, gastritis, duodenitis. Will continue with daily PPI and send course of carafate 1g QID to see if this helps with epigastric pain.   She continue with diarrhea, up to 3-6 episodes per day, recent C diff testing is negative. Thyroid function WNL. No improvement with course of Xifaxan. At this time unclear etiology of her ongoing diarrhea. Very likely there is a large psychosomatic component, however, recommend proceeding with colonoscopy with random colonic biopsies for further evaluation.   Patient hesitant to undergo EGD/Colonoscopy due to previous traumatic experience with being sedated, I discussed the importance of further evaluation of her symptoms via endoscopic evaluation as well as details of the entire procedural process to include indications, risks and benefits. Patient's husband present at visit was also encouraging of patient scheduling procedures. She is amenable to getting these setup.    PLAN:  Continue zofran 4mg  q8h  2. Rx carafate 1g QID 3. Continue lomotil QID PRN, refill sent  4. EGD/Colonoscopy ASA III  5. Continue protonix 40mg  daily   All questions were answered, patient verbalized understanding and is in agreement with plan as outlined above.   Follow Up: 3 months   Veleta Yamamoto L. Jeanmarie Hubert, MSN, APRN, AGNP-C Adult-Gerontology Nurse Practitioner [facility]  I have reviewed the note and agree with the APP's assessment as described in this progress note  Katrinka Blazing, MD Gastroenterology and Hepatology [facility]astro[facility]

## 2022-09-10 NOTE — Patient Instructions (Addendum)
Please continue to use the zofran 4mg  every 8 hours as needed for nausea I have sent carafate 1g liquid to take before meals and at bedtime for your upper abdominal pain I also sent a refill of lomotil to take up to 4x/day for diarrhea As discussed, I think our next step is an upper endoscopy and colonoscopy, please call back to schedule this once you know your availability  Continue protonix 40mg  daily  Follow up 3 months

## 2022-09-11 ENCOUNTER — Other Ambulatory Visit (INDEPENDENT_AMBULATORY_CARE_PROVIDER_SITE_OTHER): Payer: Self-pay | Admitting: Gastroenterology

## 2022-09-11 ENCOUNTER — Telehealth: Payer: Self-pay | Admitting: Family Medicine

## 2022-09-11 NOTE — Telephone Encounter (Signed)
  Prescription Request  09/11/2022  What is the name of the medication or equipment? CLONAZEPAM  Have you contacted your pharmacy to request a refill? YES  Which pharmacy would you like this sent to? [facility]  Pt called stating that she is having a hard time because she ran out of her Clonazepam Rx and needs PCP to send in refills asap.

## 2022-09-12 ENCOUNTER — Emergency Department (HOSPITAL_COMMUNITY)
Admission: EM | Admit: 2022-09-12 | Discharge: 2022-09-12 | Disposition: A | Discharge status: Home / Self Care | Payer: 59 | Attending: Emergency Medicine | Admitting: Emergency Medicine

## 2022-09-12 ENCOUNTER — Telehealth: Payer: Self-pay | Admitting: Family Medicine

## 2022-09-12 ENCOUNTER — Other Ambulatory Visit: Payer: Self-pay

## 2022-09-12 ENCOUNTER — Emergency Department (HOSPITAL_COMMUNITY): Payer: 59

## 2022-09-12 ENCOUNTER — Encounter (HOSPITAL_COMMUNITY): Payer: Self-pay | Admitting: Emergency Medicine

## 2022-09-12 DIAGNOSIS — Z79899 Other long term (current) drug therapy: Secondary | ICD-10-CM | POA: Diagnosis not present

## 2022-09-12 DIAGNOSIS — F419 Anxiety disorder, unspecified: Secondary | ICD-10-CM | POA: Diagnosis present

## 2022-09-12 DIAGNOSIS — R0602 Shortness of breath: Secondary | ICD-10-CM | POA: Insufficient documentation

## 2022-09-12 DIAGNOSIS — R197 Diarrhea, unspecified: Secondary | ICD-10-CM | POA: Diagnosis not present

## 2022-09-12 DIAGNOSIS — F1290 Cannabis use, unspecified, uncomplicated: Secondary | ICD-10-CM | POA: Diagnosis not present

## 2022-09-12 DIAGNOSIS — R42 Dizziness and giddiness: Secondary | ICD-10-CM | POA: Diagnosis not present

## 2022-09-12 DIAGNOSIS — R1110 Vomiting, unspecified: Secondary | ICD-10-CM | POA: Diagnosis not present

## 2022-09-12 LAB — CBC WITH DIFFERENTIAL/PLATELET
Abs Immature Granulocytes: 0.01 10*3/uL (ref 0.00–0.07)
Basophils Absolute: 0 10*3/uL (ref 0.0–0.1)
Basophils Relative: 0 %
Eosinophils Absolute: 0.1 10*3/uL (ref 0.0–0.5)
Eosinophils Relative: 1 %
HCT: 37.5 % (ref 36.0–46.0)
Hemoglobin: 12.8 g/dL (ref 12.0–15.0)
Immature Granulocytes: 0 %
Lymphocytes Relative: 32 %
Lymphs Abs: 2.6 10*3/uL (ref 0.7–4.0)
MCH: 30.5 pg (ref 26.0–34.0)
MCHC: 34.1 g/dL (ref 30.0–36.0)
MCV: 89.3 fL (ref 80.0–100.0)
Monocytes Absolute: 0.4 10*3/uL (ref 0.1–1.0)
Monocytes Relative: 5 %
Neutro Abs: 4.8 10*3/uL (ref 1.7–7.7)
Neutrophils Relative %: 62 %
Platelets: 272 10*3/uL (ref 150–400)
RBC: 4.2 MIL/uL (ref 3.87–5.11)
RDW: 13.5 % (ref 11.5–15.5)
WBC: 7.9 10*3/uL (ref 4.0–10.5)
nRBC: 0 % (ref 0.0–0.2)

## 2022-09-12 LAB — RAPID URINE DRUG SCREEN, HOSP PERFORMED
Amphetamines: NOT DETECTED
Barbiturates: NOT DETECTED
Benzodiazepines: NOT DETECTED
Cocaine: NOT DETECTED
Opiates: NOT DETECTED
Tetrahydrocannabinol: POSITIVE — AB

## 2022-09-12 LAB — COMPREHENSIVE METABOLIC PANEL
ALT: 44 U/L (ref 0–44)
AST: 29 U/L (ref 15–41)
Albumin: 3.5 g/dL (ref 3.5–5.0)
Alkaline Phosphatase: 85 U/L (ref 38–126)
Anion gap: 9 (ref 5–15)
BUN: 10 mg/dL (ref 8–23)
CO2: 22 mmol/L (ref 22–32)
Calcium: 8.8 mg/dL — ABNORMAL LOW (ref 8.9–10.3)
Chloride: 105 mmol/L (ref 98–111)
Creatinine, Ser: 0.65 mg/dL (ref 0.44–1.00)
GFR, Estimated: >60 mL/min (ref >60)
Glucose, Bld: 96 mg/dL (ref 70–99)
Potassium: 3.5 mmol/L (ref 3.5–5.1)
Sodium: 136 mmol/L (ref 135–145)
Total Bilirubin: 0.6 mg/dL (ref 0.3–1.2)
Total Protein: 6.8 g/dL (ref 6.5–8.1)

## 2022-09-12 LAB — ETHANOL: Alcohol, Ethyl (B): <10 mg/dL (ref <10)

## 2022-09-12 MED ORDER — LORAZEPAM 2 MG/ML IJ SOLN
1.0000 mg | Freq: Once | INTRAMUSCULAR | Status: AC
  Administered 2022-09-12: 1 mg via INTRAVENOUS
  Filled 2022-09-12: qty 1

## 2022-09-12 NOTE — ED Notes (Addendum)
Pt states "I want to go home, can you let the doctor know I am ready to go"--MD made aware

## 2022-09-12 NOTE — ED Notes (Signed)
Pt is hyperventilating and continuosly states "Miss please help me, please help me. When asked her pain level from 0 to 10 pt states "10" when asked where it hurt pt states "I am a 10 mentally" denies SI/HI

## 2022-09-12 NOTE — ED Provider Notes (Signed)
[facility] AT [facility] Provider Note   CSN: [id number] Arrival date & time: 09/12/22  1012     History  Chief Complaint  Patient presents with   Panic Attack    Dawn Kelley is a 62 y.o. female.  She is brought in by her husband for continued anxiety.  She says she cannot stop moving, and is constantly restless.  She feels short of breath.  She said she last saw her psychiatrist about a month ago and talk to them on the phone yesterday.  She has had some medication adjustments that do not seem to help.  She denies any suicidal thoughts.  The history is provided by the patient and the spouse.  Anxiety This is a chronic problem. The problem occurs constantly. The problem has not changed since onset.Associated symptoms include shortness of breath. Pertinent negatives include no chest pain, no abdominal pain and no headaches. Treatments tried: prescribed meds. The treatment provided no relief.       Home Medications Prior to Admission medications   Medication Sig Start Date End Date Taking? Authorizing Provider  busPIRone (BUSPAR) 30 MG tablet Take 10 mg by mouth. One at bedtime    [provider]  Cholecalciferol (SM VITAMIN D3) 100 MCG (4000 UT) CAPS Take by mouth.    [provider]  clonazePAM (KLONOPIN) 1 MG tablet Take 1 mg by mouth 2 (two) times daily as needed for anxiety.    [provider]  cyanocobalamin (VITAMIN B12) 1000 MCG tablet Take 1,000 mcg by mouth daily.    [provider]  diphenoxylate-atropine (LOMOTIL) 2.5-0.025 MG tablet Take 1 tablet by mouth 4 (four) times daily as needed for diarrhea or loose stools (diarrhea). Prn. 09/10/22   Carlan, Jeral Pinch, NP  hydrOXYzine (ATARAX) 25 MG tablet Take 1 tablet (25 mg total) by mouth every 6 (six) hours. Patient not taking: Reported on 09/10/2022 06/26/22   Roemhildt, Lorin T, PA-C  hydrOXYzine (VISTARIL) 25 MG capsule Take by mouth. Patient not taking:  Reported on 09/10/2022 07/10/22   [provider]  levothyroxine (SYNTHROID) 25 MCG tablet Take 25 mcg by mouth daily before breakfast.    [provider]  losartan (COZAAR) 25 MG tablet Take 25 mg by mouth daily.    [provider]  magnesium oxide (MAG-OX) 400 (240 Mg) MG tablet Take 400 mg by mouth daily.    [provider]  mirtazapine (REMERON) 45 MG tablet Take 45 mg by mouth at bedtime.    [provider]  naltrexone (DEPADE) 50 MG tablet Take 50 mg by mouth daily. Patient not taking: Reported on 08/28/2022    [provider]  OLANZapine (ZYPREXA) 15 MG tablet Take 15 mg by mouth at bedtime.    [provider]  ondansetron (ZOFRAN) 4 MG tablet Take 1 tablet (4 mg total) by mouth every 8 (eight) hours as needed for nausea or vomiting. 08/28/22   Milian, Aleen Campi, FNP  pantoprazole (PROTONIX) 40 MG tablet TAKE 1 TABLET ONCE DAILY. 09/10/22   Carlan, Chelsea L, NP  propranolol (INDERAL) 20 MG tablet Take 20 mg by mouth 3 (three) times daily. 03/29/20   [provider]  sucralfate (CARAFATE) 1 GM/10ML suspension Take 10 mLs (1 g total) by mouth 4 (four) times daily. 09/10/22   Carlan, Chelsea L, NP  topiramate (TOPAMAX) 25 MG capsule Take 25 mg by mouth 2 (two) times daily.    [provider]  Vilazodone HCl (VIIBRYD)  40 MG TABS Take 40 mg by mouth daily.    [provider]      Allergies    Escitalopram, Seroquel [quetiapine fumarate], and Trazodone    Review of Systems   Review of Systems  Constitutional:  Negative for fever.  Eyes:  Negative for visual disturbance.  Respiratory:  Positive for shortness of breath.   Cardiovascular:  Negative for chest pain.  Gastrointestinal:  Negative for abdominal pain.  Neurological:  Negative for headaches.    Physical Exam Updated Vital Signs BP (!) 143/75 (BP Location: Left Arm)   Pulse 66   Temp 97.6 F (36.4 C) (Oral)   Resp (!) 25   Ht 5\' 5"   (1.651 m)   Wt 90.7 kg   SpO2 100%   BMI 33.27 kg/m  Physical Exam Vitals and nursing note reviewed.  Constitutional:      General: She is not in acute distress.    Appearance: Normal appearance. She is well-developed.  HENT:     Head: Normocephalic and atraumatic.  Eyes:     Conjunctiva/sclera: Conjunctivae normal.  Cardiovascular:     Rate and Rhythm: Normal rate and regular rhythm.     Heart sounds: No murmur heard. Pulmonary:     Effort: Pulmonary effort is normal. No respiratory distress.     Breath sounds: Normal breath sounds.  Abdominal:     Palpations: Abdomen is soft.     Tenderness: There is no abdominal tenderness. There is no guarding or rebound.  Musculoskeletal:        General: Normal range of motion.     Cervical back: Neck supple.  Skin:    General: Skin is warm and dry.     Capillary Refill: Capillary refill takes less than 2 seconds.  Neurological:     General: No focal deficit present.     Mental Status: She is alert and oriented to person, place, and time.     Cranial Nerves: No cranial nerve deficit.     Sensory: No sensory deficit.     Motor: No weakness.     ED Results / Procedures / Treatments   Labs (all labs ordered are listed, but only abnormal results are displayed) Labs Reviewed  COMPREHENSIVE METABOLIC PANEL - Abnormal; Notable for the following components:      Result Value   Calcium 8.8 (*)    All other components within normal limits  RAPID URINE DRUG SCREEN, HOSP PERFORMED - Abnormal; Notable for the following components:   Tetrahydrocannabinol POSITIVE (*)    All other components within normal limits  ETHANOL  CBC WITH DIFFERENTIAL/PLATELET    EKG EKG Interpretation  Date/Time:  Wednesday Sep 12 2022 10:57:23 EDT Ventricular Rate:  64 PR Interval:    QRS Duration: 91 QT Interval:  402 QTC Calculation: 415 R Axis:   2 Text Interpretation: sinus rhythm with tremor No significant change since prior 10/23 Confirmed by  Meridee Score [phone]) on 09/12/2022 11:06:52 AM  Radiology No results found.  Procedures Procedures    Medications Ordered in ED Medications  LORazepam (ATIVAN) injection 1 mg (1 mg Intravenous Given 09/12/22 1102)  LORazepam (ATIVAN) injection 1 mg (1 mg Intravenous Given 09/12/22 1154)    ED Course/ Medical Decision Making/ A&P Clinical Course as of 09/12/22 1711  Wed Sep 12, 2022  1145 Patient states she does not feel any better.  Will put her in for TTS evaluation [MB]  1249 Patient states she feels better now and would like to  be discharged.  I encouraged her to stay but she says she would rather follow-up with her outpatient treatment team.  Return instructions discussed [MB]    Clinical Course User Index [MB] Terrilee Files, MD                             Medical Decision Making Amount and/or Complexity of Data Reviewed Labs: ordered. Radiology: ordered.  Risk Prescription drug management.   This patient complains of worsening of her chronic anxiety shortness of breath; this involves an extensive number of treatment Options and is a complaint that carries with it a high risk of complications and morbidity. The differential includes anxiety, pneumonia, pneumothorax, anemia, substance abuse  I ordered, reviewed and interpreted labs, which included CBC normal chemistries normal LFTs normal talk screen positive for THC I ordered medication IV Ativan and reviewed PMP when indicated. I ordered imaging studies which included chest x-ray and I independently    visualized and interpreted imaging which showed no acute findings Additional history obtained from patient's spouse Previous records obtained and reviewed in epic including prior PCP and ED visits Cardiac monitoring reviewed, normal sinus rhythm Social determinants considered, ongoing depression and physically inactive Critical Interventions: None  After the interventions stated above, I reevaluated the patient  and found patient still to appear anxious although otherwise in no distress Admission and further testing considered, she would benefit from psychiatric evaluation for consideration for admission.  Patient is declining further psychiatric evaluation and wishes to follow-up with her treatment team.  She is voluntary and not suicidal so I think this is reasonable.  She understands to return if any worsening of her condition.         Final Clinical Impression(s) / ED Diagnoses Final diagnoses:  Anxiety    Rx / DC Orders ED Discharge Orders     None         Terrilee Files, MD 09/12/22 [phone]

## 2022-09-12 NOTE — Telephone Encounter (Signed)
Patient called stating that she feels really bad and keeps passing out and doesn't know what is wrong with her.   Tried getting a nurse to speak with patient but no one available/replied. Waited 5 minutes and patient said she was going to Urgent Care and couldn't wait any longer for nurse to take her call.
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
## 2022-09-12 NOTE — Discharge Instructions (Signed)
You were seen in the emergency department for worsening anxiety.  You were offered a psychiatric consultation and you declined this.  Please follow-up with your outpatient psychiatrist.  Continue regular medications.  Return to the emergency department if any worsening or concerning symptoms.

## 2022-09-12 NOTE — Telephone Encounter (Signed)
Patient aware and verbalized understanding. °

## 2022-09-12 NOTE — Telephone Encounter (Signed)
FYI   Called patient she was at Urgent care with her husband they advised her to go to ER. She told me she didn't know what to do advised patient she need to to go to the ER to have test ran. She verbalized understanding. Asked patient if any one could take her her husband was with her and he was going to take her.

## 2022-09-15 ENCOUNTER — Emergency Department (HOSPITAL_COMMUNITY)
Admission: EM | Admit: 2022-09-15 | Discharge: 2022-09-15 | Disposition: A | Discharge status: Home / Self Care | Payer: 59 | Attending: Emergency Medicine | Admitting: Emergency Medicine

## 2022-09-15 ENCOUNTER — Other Ambulatory Visit: Payer: Self-pay

## 2022-09-15 DIAGNOSIS — F419 Anxiety disorder, unspecified: Secondary | ICD-10-CM | POA: Diagnosis present

## 2022-09-15 DIAGNOSIS — K529 Noninfective gastroenteritis and colitis, unspecified: Secondary | ICD-10-CM | POA: Diagnosis not present

## 2022-09-15 DIAGNOSIS — R112 Nausea with vomiting, unspecified: Secondary | ICD-10-CM | POA: Diagnosis not present

## 2022-09-15 DIAGNOSIS — R197 Diarrhea, unspecified: Secondary | ICD-10-CM | POA: Diagnosis not present

## 2022-09-15 MED ORDER — LORAZEPAM 1 MG PO TABS
1.0000 mg | ORAL_TABLET | Freq: Once | ORAL | Status: AC
  Administered 2022-09-15: 1 mg via ORAL
  Filled 2022-09-15: qty 1

## 2022-09-15 MED ORDER — HYDROXYZINE HCL 25 MG PO TABS: 25.0000 mg | ORAL_TABLET | Freq: Three times a day (TID) | ORAL | 0 refills | Status: DC | PRN

## 2022-09-15 MED ORDER — CLONAZEPAM 0.5 MG PO TABS: 0.5000 mg | ORAL_TABLET | Freq: Two times a day (BID) | ORAL | 0 refills | Status: DC | PRN

## 2022-09-15 NOTE — Discharge Instructions (Addendum)
It was our pleasure to provide your ER care today - we hope that you feel better.  Take your meds as prescribed. As relates anxiety and your klonopin medication - follow up with your doctor this week and discuss plan to gradually taper off of that medication (we have provided you an additional one week supply today). You may also take your vistaril (hydroxyzine) as need for anxiety. These medications can cause drowsiness - no driving when taking.  See also attached information on other strategies and techniques to help with anxiety management.   Note that increasingly we are seeing marijuana/thc use as the cause of a recurrent GI symptom (I.e. abd pain, nausea, vomiting, etc.) illness called cannabinoid hyperemesis syndrome - see attached information. In these cases, abstaining from marijuana use for atleast a month may improve symptoms.   Follow up closely with your primary care doctor and behavioral health provider this coming week.  Return to ER if worse, new symptoms, fevers, chest pain, trouble breathing, or other emergency concern.

## 2022-09-15 NOTE — ED Triage Notes (Signed)
Pt c/o anxiety . Pt stated she took her meds this morning but is not working.
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
## 2022-09-17 DIAGNOSIS — I10 Essential (primary) hypertension: Secondary | ICD-10-CM | POA: Diagnosis not present

## 2022-09-17 DIAGNOSIS — Z0000 Encounter for general adult medical examination without abnormal findings: Secondary | ICD-10-CM | POA: Diagnosis not present

## 2022-09-17 DIAGNOSIS — I700 Atherosclerosis of aorta: Secondary | ICD-10-CM | POA: Diagnosis not present

## 2022-09-17 DIAGNOSIS — E038 Other specified hypothyroidism: Secondary | ICD-10-CM | POA: Diagnosis not present

## 2022-09-25 ENCOUNTER — Telehealth: Payer: Self-pay | Admitting: Family Medicine

## 2022-09-25 NOTE — Telephone Encounter (Signed)
Pt scheduled and aware

## 2022-09-28 ENCOUNTER — Encounter: Payer: Self-pay | Admitting: Family Medicine

## 2022-09-28 ENCOUNTER — Telehealth (INDEPENDENT_AMBULATORY_CARE_PROVIDER_SITE_OTHER): Payer: 59 | Admitting: Family Medicine

## 2022-09-28 DIAGNOSIS — Z91199 Patient's noncompliance with other medical treatment and regimen due to unspecified reason: Secondary | ICD-10-CM

## 2022-09-28 NOTE — Progress Notes (Signed)
Attempted to reach. Never joined visit.  I reviewed her chart and it looks like she is seeing GI and Psychiatry.  However, if still needing appt, please reschedule her/ have her make an appt with PCP or her specialist for ongoing follow up/ management. Start time: 3:00pm (link sent), 3:05pm (link resent); final text sent at 3:10pm I stayed on the video for 2 additional minutes after last text sent but patient never joined visit.

## 2022-10-02 ENCOUNTER — Encounter: Payer: Self-pay | Admitting: Family Medicine

## 2022-10-02 ENCOUNTER — Ambulatory Visit (INDEPENDENT_AMBULATORY_CARE_PROVIDER_SITE_OTHER): Payer: 59 | Admitting: Family Medicine

## 2022-10-02 VITALS — BP 128/78 | HR 96 | Temp 98.3°F | Ht 65.0 in | Wt 200.0 lb

## 2022-10-02 DIAGNOSIS — F339 Major depressive disorder, recurrent, unspecified: Secondary | ICD-10-CM | POA: Diagnosis not present

## 2022-10-02 DIAGNOSIS — F411 Generalized anxiety disorder: Secondary | ICD-10-CM | POA: Diagnosis not present

## 2022-10-02 NOTE — Progress Notes (Signed)
Acute Office Visit  Subjective:  Patient ID: Dawn Kelley, female    DOB: 09-16-1960, 62 y.o.   MRN: [id number]  Chief Complaint  Patient presents with   Acute Visit    Discuss anxiety    HPI Patient is in today for follow up of anxiety  States that she has had a breakdown and is "becoming immobile" states that she "doesn't know what to do". Reports that her appetite has declined. She states that she would like a new psychiatrist because she has not seen any improvement in 3 years. Medication come pre-packaged.   ROS As per HPI  Objective:  BP 128/78   Pulse 96   Temp 98.3 F (36.8 C)   Ht 5\' 5"  (1.651 m)   Wt 200 lb (90.7 kg)   SpO2 97%   BMI 33.28 kg/m   Physical Exam Constitutional:      General: She is not in acute distress.    Appearance: Normal appearance. She is not ill-appearing, toxic-appearing or diaphoretic.  Cardiovascular:     Rate and Rhythm: Normal rate.     Pulses: Normal pulses.     Heart sounds: Normal heart sounds. No murmur heard.    No gallop.  Pulmonary:     Effort: Pulmonary effort is normal. No respiratory distress.     Breath sounds: Normal breath sounds. No stridor. No wheezing, rhonchi or rales.  Skin:    General: Skin is warm.     Capillary Refill: Capillary refill takes less than 2 seconds.  Neurological:     General: No focal deficit present.     Mental Status: She is alert and oriented to person, place, and time. Mental status is at baseline.     Motor: No weakness.  Psychiatric:        Mood and Affect: Mood normal.        Behavior: Behavior normal.        Thought Content: Thought content normal.        Judgment: Judgment normal.    Results for orders placed or performed in visit on 10/02/22  HM MAMMOGRAPHY  Result Value Ref Range   HM Mammogram 0-4 Bi-Rad 0-4 Bi-Rad, Self Reported Normal      10/02/2022    2:16 PM 08/28/2022    2:32 PM 09/05/2020    2:43 PM  Depression screen PHQ 2/9  Decreased Interest 3 3 3   Down,  Depressed, Hopeless 3 3 3   PHQ - 2 Score 6 6 6   Altered sleeping 2 3 3   Tired, decreased energy 2 3 3   Change in appetite 3 3 3   Feeling bad or failure about yourself  1 3 3   Trouble concentrating 3 3 3   Moving slowly or fidgety/restless 0 3 3  Suicidal thoughts 0 0 0  PHQ-9 Score 17 24 24   Difficult doing work/chores Extremely dIfficult Extremely dIfficult       10/02/2022    2:17 PM 08/28/2022    2:32 PM 09/05/2020    2:44 PM 02/27/2020    6:54 PM  GAD 7 : Generalized Anxiety Score  Nervous, Anxious, on Edge 3 3 3    Control/stop worrying 3 3 3    Worry too much - different things 3 3 3    Trouble relaxing 3 3 3    Restless 3 3 3    Easily annoyed or irritable 3 3 3    Afraid - awful might happen 3 3 3    Total GAD 7 Score 21 21 21  Anxiety Difficulty Somewhat difficult Very difficult       Information is confidential and restricted. Go to Review Flowsheets to unlock data.    Assessment & Plan:  1. Depression, recurrent (HCC) 2. Generalized anxiety disorder Patient has been referred to psychaitry for her to switch providers. She was approved for [facility]. Provided patient and husband contact information for her to make a follow up appointment. PDMP reviewed. Denies SI. Safety contract established with patient. Given polypharmacy and complexity of condition, will defer treatment to specialty. Offered integrative behavioral health referral, declined at this time.   The above assessment and management plan was discussed with the patient. The patient verbalized understanding of and has agreed to the management plan using shared-decision making. Patient is aware to call the clinic if they develop any new symptoms or if symptoms fail to improve or worsen. Patient is aware when to return to the clinic for a follow-up visit. Patient educated on when it is appropriate to go to the emergency department.   Return in about 2 months (around 12/02/2022) for CPE.  Neale Burly,  DNP-FNP [facility] [address] [place], [place] [address] [phone]

## 2022-10-02 NOTE — Patient Instructions (Signed)
Our records indicate that you are due for your screening mammogram.  Please call the imaging center that does your yearly mammograms to make an appointment for a mammogram at your earliest convenience. Our office also has a mobile unit through the [facility] that comes to our location. Please call our office if you would like to make an appointment.   [facility] [address] [place] [address] [phone]

## 2022-10-04 ENCOUNTER — Encounter (INDEPENDENT_AMBULATORY_CARE_PROVIDER_SITE_OTHER): Payer: Self-pay

## 2022-10-09 ENCOUNTER — Telehealth: Payer: Self-pay | Admitting: Family Medicine

## 2022-10-09 ENCOUNTER — Telehealth: Payer: Self-pay

## 2022-10-09 NOTE — Telephone Encounter (Signed)
Pt has been given the numbers to the [facility] and [facility].

## 2022-10-09 NOTE — Telephone Encounter (Signed)
Patient was called to schedule AWV - Stefanie called the office due to concerns about the mental health of the patient and possible thoughts of self harm.  Called the patient back she states that she has extreme depressions and does not know how much longer she can take. She has a long history of depression, anxiety, and PTSD. She states that it has gotten worse and she has been having a hard time dealing with it for the last 3 years. Patient's husband has to help her with daily activities, including showering. She does not feel like Klonopin is helping any longer. Patient states that she needs help and does not know how much longer she can handle this. She denies self harm but does have a history of self harm several years ago. Patient is also experiencing panic attacks at times and that she has history of panic attacks. I advise the patient that if she has any self harm thoughts or if panic attacks increase in frequency or severity to call 911 to get medical help out as soon as possible.  Offered help line number - patient states that she has been given all of them but lacks motivation to call.

## 2022-10-09 NOTE — Telephone Encounter (Signed)
Called patient to schedule AWV and she wanted to speak with someone or come into the office to get help with her depression today.   Tried to transfer patient to [facility] but lost patient in the transfer.  Melissa said she would give the patient a call when we hung up.

## 2022-10-12 ENCOUNTER — Telehealth: Payer: Self-pay

## 2022-10-12 NOTE — Telephone Encounter (Signed)
Patient calls and reports that she is very nervous and anxious and does not know what to do.  Patient had called in a few days ago and was given the phone numbers and address for the [facility] and also for the [facility].  Patient has not tried to contact either of them.  She was given this information again and will call them.

## 2022-10-17 NOTE — Patient Instructions (Incomplete)
Our records indicate that you are due for your screening mammogram.  Please call the imaging center that does your yearly mammograms to make an appointment for a mammogram at your earliest convenience. Our office also has a mobile unit through the [facility] that comes to our location. Please call our office if you would like to make an appointment.   

## 2022-10-18 ENCOUNTER — Encounter: Payer: Self-pay | Admitting: Family Medicine

## 2022-10-18 ENCOUNTER — Telehealth: Payer: Self-pay | Admitting: Family Medicine

## 2022-10-18 ENCOUNTER — Ambulatory Visit (INDEPENDENT_AMBULATORY_CARE_PROVIDER_SITE_OTHER): Payer: 59 | Admitting: Family Medicine

## 2022-10-18 VITALS — BP 111/68 | HR 76 | Temp 97.0°F | Resp 20 | Ht 65.0 in | Wt 201.0 lb

## 2022-10-18 DIAGNOSIS — F411 Generalized anxiety disorder: Secondary | ICD-10-CM | POA: Diagnosis not present

## 2022-10-18 DIAGNOSIS — F331 Major depressive disorder, recurrent, moderate: Secondary | ICD-10-CM | POA: Diagnosis not present

## 2022-10-18 DIAGNOSIS — F1990 Other psychoactive substance use, unspecified, uncomplicated: Secondary | ICD-10-CM | POA: Diagnosis not present

## 2022-10-18 NOTE — Telephone Encounter (Signed)
Pt says she called the [facility] in [place] that PCP advised her to go to, and says she was told that they are not in network with patients insurance.

## 2022-10-18 NOTE — Progress Notes (Signed)
Acute Office Visit  Subjective:  Patient ID: Dawn Kelley, female    DOB: Mar 23, 1961, 63 y.o.   MRN: [id number]  Chief Complaint  Patient presents with   Anxiety   Patient is in today because her "anxiety is really bad" Reports it is to the point that she cannot drive because of it. States that she is having trouble eating because of it. Her food gets dry in her mouth and she has trouble swallowing it. Endorses that she is having trouble concentrating and does not have motivation to do anything. Husband states that her depression is increasing too.  States that she is scared of taking the seroquel because she is does not know what it is for, so she has not been taking it. Husband states he was not present when she was on the teledoc appt with the provider that is prescribing Seroquel. States that she is taking the clonezapam as needed, twice daily.  Seroquel is given at [facility] and it is all prepackaged. When asked why they have not contacted the [facility], Husband states that he did not know about the [facility] in [place].   ROS As per HPI   Objective:  BP 111/68   Pulse 76   Temp (!) 97 F (36.1 C) (Oral)   Resp 20   Ht 5\' 5"  (1.651 m)   Wt 201 lb (91.2 kg)   SpO2 98%   BMI 33.45 kg/m   Physical Exam Constitutional:      General: She is awake. She is not in acute distress.    Appearance: Normal appearance. She is well-developed and well-groomed. She is not ill-appearing, toxic-appearing or diaphoretic.  Cardiovascular:     Rate and Rhythm: Normal rate.     Pulses: Normal pulses.          Radial pulses are 2+ on the right side and 2+ on the left side.       Posterior tibial pulses are 2+ on the right side and 2+ on the left side.     Heart sounds: Normal heart sounds. No murmur heard.    No gallop.  Pulmonary:     Effort: Pulmonary effort is normal. No respiratory distress.     Breath sounds: Normal breath sounds. No stridor. No wheezing,  rhonchi or rales.  Musculoskeletal:     Cervical back: Full passive range of motion without pain and neck supple.     Right lower leg: No edema.     Left lower leg: No edema.  Skin:    General: Skin is warm.     Capillary Refill: Capillary refill takes less than 2 seconds.  Neurological:     General: No focal deficit present.     Mental Status: She is alert, oriented to person, place, and time and easily aroused. Mental status is at baseline.     GCS: GCS eye subscore is 4. GCS verbal subscore is 5. GCS motor subscore is 6.     Motor: No weakness.  Psychiatric:        Attention and Perception: Perception normal. She is inattentive.        Mood and Affect: Mood is anxious and depressed. Mood is not elated. Affect is tearful and inappropriate.        Speech: Speech is delayed.        Behavior: Behavior is slowed and withdrawn. Behavior is cooperative.        Thought Content: Thought content normal. Thought content  does not include homicidal or suicidal ideation. Thought content does not include homicidal or suicidal plan.        Cognition and Memory: Cognition and memory normal.        Judgment: Judgment is inappropriate.       10/18/2022   10:04 AM 10/02/2022    2:16 PM 08/28/2022    2:32 PM  Depression screen PHQ 2/9  Decreased Interest 3 3 3   Down, Depressed, Hopeless 3 3 3   PHQ - 2 Score 6 6 6   Altered sleeping 2 2 3   Tired, decreased energy 2 2 3   Change in appetite 3 3 3   Feeling bad or failure about yourself  3 1 3   Trouble concentrating 3 3 3   Moving slowly or fidgety/restless 3 0 3  Suicidal thoughts 0 0 0  PHQ-9 Score 22 17 24   Difficult doing work/chores Extremely dIfficult Extremely dIfficult Extremely dIfficult      10/18/2022   10:05 AM 10/02/2022    2:17 PM 08/28/2022    2:32 PM 09/05/2020    2:44 PM  GAD 7 : Generalized Anxiety Score  Nervous, Anxious, on Edge 3 3 3 3   Control/stop worrying 3 3 3 3   Worry too much - different things 3 3 3 3   Trouble relaxing 3  3 3 3   Restless 2 3 3 3   Easily annoyed or irritable 3 3 3 3   Afraid - awful might happen 3 3 3 3   Total GAD 7 Score 20 21 21 21   Anxiety Difficulty Extremely difficult Somewhat difficult Very difficult     Assessment & Plan:  1. Moderate episode of recurrent major depressive disorder (HCC) Gave patient and husband information for [facility] in [place]. Provided with written and verbal instructions. Verbal teachback completed to ensure that patient and caregiver understood that [facility] would take over care. Explained to patient and caregiver that I would not take over psychiatric medications as she is seeing additional providers and is receiving controlled medications from other providers. PDMP reviewed, no red flags at this time. Denies SI. Safety contract established. Provided patient and caregiver with information of [facility].   2. Generalized anxiety disorder See above.   3. Substance use disorder Given use of marijuana will not enter into a CSA with patient. She is obtaining controlled substances from other providers. Reviewed PDMP.   The above assessment and management plan was discussed with the patient. The patient verbalized understanding of and has agreed to the management plan using shared-decision making. Patient is aware to call the clinic if they develop any new symptoms or if symptoms fail to improve or worsen. Patient is aware when to return to the clinic for a follow-up visit. Patient educated on when it is appropriate to go to the emergency department.   Return if symptoms worsen or fail to improve.  Neale Burly, DNP-FNP [facility] [address] [place], [place] [address] [phone]

## 2022-10-23 ENCOUNTER — Ambulatory Visit (INDEPENDENT_AMBULATORY_CARE_PROVIDER_SITE_OTHER): Payer: 59

## 2022-10-23 VITALS — Ht 66.0 in | Wt 200.0 lb

## 2022-10-23 DIAGNOSIS — Z0000 Encounter for general adult medical examination without abnormal findings: Secondary | ICD-10-CM

## 2022-10-23 NOTE — Telephone Encounter (Signed)
Contacted patient. Notified patient. Patient verbalized understanding 

## 2022-10-23 NOTE — Progress Notes (Signed)
Subjective:   Dawn Kelley is a 62 y.o. female who presents for Medicare Annual (Subsequent) preventive examination. I connected with  Dawn Kelley on 10/23/22 by a audio enabled telemedicine application and verified that I am speaking with the correct person using two identifiers.  Patient Location: Home  Provider Location: Home Office  I discussed the limitations of evaluation and management by telemedicine. The patient expressed understanding and agreed to proceed.  Review of Systems     Cardiac Risk Factors include: advanced age (>76men, >33 women);dyslipidemia;hypertension     Objective:    Today's Vitals   10/23/22 1318  Weight: 200 lb (90.7 kg)  Height: 5\' 6"  (1.676 m)   Body mass index is 32.28 kg/m.     10/23/2022    1:22 PM 09/15/2022    9:45 AM 09/12/2022   10:42 AM 09/12/2022   10:41 AM 08/05/2022    9:10 AM 07/17/2022    5:53 AM 06/26/2022   11:18 AM  Advanced Directives  Does Patient Have a Medical Advance Directive? No No No No No No No  Would patient like information on creating a medical advance directive? No - Patient declined No - Patient declined No - Patient declined No - Patient declined No - Patient declined No - Patient declined     Current Medications (verified) Outpatient Encounter Medications as of 10/23/2022  Medication Sig   busPIRone (BUSPAR) 30 MG tablet Take 30 mg by mouth. One at bedtime   clonazePAM (KLONOPIN) 1 MG tablet Take 1 mg by mouth 2 (two) times daily as needed for anxiety.   diphenoxylate-atropine (LOMOTIL) 2.5-0.025 MG tablet Take 1 tablet by mouth 4 (four) times daily as needed for diarrhea or loose stools (diarrhea). Prn.   hydrOXYzine (ATARAX) 25 MG tablet Take 1 tablet (25 mg total) by mouth every 8 (eight) hours as needed. May make drowsy, no driving when taking   levothyroxine (SYNTHROID) 25 MCG tablet Take 25 mcg by mouth daily before breakfast.   losartan (COZAAR) 25 MG tablet Take 25 mg by mouth daily.   mirtazapine  (REMERON) 45 MG tablet Take 45 mg by mouth at bedtime.   naltrexone (DEPADE) 50 MG tablet Take 50 mg by mouth daily.   OLANZapine (ZYPREXA) 15 MG tablet Take 15 mg by mouth at bedtime.   ondansetron (ZOFRAN) 4 MG tablet Take 1 tablet (4 mg total) by mouth every 8 (eight) hours as needed for nausea or vomiting.   pantoprazole (PROTONIX) 40 MG tablet TAKE 1 TABLET ONCE DAILY.   propranolol (INDERAL) 20 MG tablet Take 20 mg by mouth 3 (three) times daily.   QUEtiapine (SEROQUEL) 25 MG tablet Take 25 mg by mouth.   rosuvastatin (CRESTOR) 20 MG tablet Take 20 mg by mouth daily.   sucralfate (CARAFATE) 1 GM/10ML suspension Take 10 mLs (1 g total) by mouth 4 (four) times daily.   topiramate (TOPAMAX) 25 MG tablet Take 25 mg by mouth 2 (two) times daily.   Vilazodone HCl (VIIBRYD) 40 MG TABS Take 40 mg by mouth daily.   No facility-administered encounter medications on file as of 10/23/2022.    Allergies (verified) Escitalopram, Seroquel [quetiapine fumarate], and Trazodone   History: Past Medical History:  Diagnosis Date   Anxiety    Bipolar 1 disorder (HCC)    Depression    OCD (obsessive compulsive disorder)    PTSD (post-traumatic stress disorder)    S/P ECT (electroconvulsive therapy)    Past Surgical History:  Procedure Laterality Date  BIOPSY  02/03/2020   Procedure: BIOPSY;  Surgeon: Marguerita Merles, Reuel Boom, MD;  Location: [facility];  Service: Gastroenterology;;   COLONOSCOPY WITH PROPOFOL N/A 02/03/2020   [name]: 3mm polyp in sigmoid colon  as well as hemorrhoids, histology consistent with tubular adenoma   ESOPHAGOGASTRODUODENOSCOPY (EGD) WITH PROPOFOL N/A 02/03/2020   castaneda: small gastric polyps, sessile, and fundic gland, small bowel bx normal   INDUCED ABORTION N/A 1980   POLYPECTOMY  02/03/2020   Procedure: POLYPECTOMY;  Surgeon: Dolores Frame, MD;  Location: [facility];  Service: Gastroenterology;;   VEIN SURGERY     Family History  Problem  Relation Age of Onset   Depression Mother    Pneumonia Mother    Social History   Socioeconomic History   Marital status: Married    Spouse name: Not on file   Number of children: Not on file   Years of education: Not on file   Highest education level: Not on file  Occupational History   Occupation: Disabled  Tobacco Use   Smoking status: Never    Passive exposure: Current   Smokeless tobacco: Never  Vaping Use   Vaping Use: Never used  Substance and Sexual Activity   Alcohol use: Not Currently    Alcohol/week: 10.0 standard drinks of alcohol    Types: 10 Cans of beer per week    Comment: daily   Drug use: Yes    Types: Marijuana    Comment: occassional   Sexual activity: Not Currently    Birth control/protection: Post-menopausal  Other Topics Concern   Not on file  Social History Narrative   Pt lives in [place] with husband.  She is on disability.  Pt stated that she receives outpatient psychiatry services through [facility].  She does not receive outpatient therapy.   Social Determinants of Health   Financial Resource Strain: Low Risk  (10/23/2022)   Overall Financial Resource Strain (CARDIA)    Difficulty of Paying Living Expenses: Not hard at all  Food Insecurity: No Food Insecurity (10/23/2022)   Hunger Vital Sign    Worried About Running Out of Food in the Last Year: Never true    Ran Out of Food in the Last Year: Never true  Transportation Needs: No Transportation Needs (10/23/2022)   PRAPARE - Administrator, Civil Service (Medical): No    Lack of Transportation (Non-Medical): No  Physical Activity: Insufficiently Active (10/23/2022)   Exercise Vital Sign    Days of Exercise per Week: 2 days    Minutes of Exercise per Session: 10 min  Stress: No Stress Concern Present (10/23/2022)   [employer] of Occupational Health - Occupational Stress Questionnaire    Feeling of Stress : Not at all  Social Connections: Moderately Isolated  (10/23/2022)   Social Connection and Isolation Panel [NHANES]    Frequency of Communication with Friends and Family: More than three times a week    Frequency of Social Gatherings with Friends and Family: More than three times a week    Attends Religious Services: Never    Database administrator or Organizations: No    Attends Engineer, structural: Never    Marital Status: Married    Tobacco Counseling Counseling given: Not Answered   Clinical Intake:  Pre-visit preparation completed: Yes  Pain : No/denies pain     Nutritional Risks: None Diabetes: No  How often do you need to have someone help you when you read instructions, pamphlets,  or other written materials from your doctor or pharmacy?: 1 - Never  Diabetic?no   Interpreter Needed?: No  Information entered by :: Renie Ora, LPN   Activities of Daily Living    10/23/2022    1:23 PM  In your present state of health, do you have any difficulty performing the following activities:  Hearing? 0  Vision? 0  Difficulty concentrating or making decisions? 0  Walking or climbing stairs? 0  Dressing or bathing? 0  Doing errands, shopping? 0  Preparing Food and eating ? N  Using the Toilet? N  In the past six months, have you accidently leaked urine? N  Do you have problems with loss of bowel control? N  Managing your Medications? N  Managing your Finances? N  Housekeeping or managing your Housekeeping? N    Patient Care Team: Milian, Aleen Campi, FNP as PCP - General (Family Medicine) Lanelle Bal, DO as Consulting Physician (Internal Medicine)  Indicate any recent Medical Services you may have received from other than [facility] providers in the past year (date may be approximate).     Assessment:   This is a routine wellness examination for [place].  Hearing/Vision screen Vision Screening - Comments:: Wears rx glasses - up to date with routine eye exams with  Dr.Martin   Dietary issues and  exercise activities discussed: Current Exercise Habits: Home exercise routine, Type of exercise: walking, Time (Minutes): 20, Frequency (Times/Week): 2, Weekly Exercise (Minutes/Week): 40, Intensity: Mild, Exercise limited by: psychological condition(s)   Goals Addressed             This Visit's Progress    Exercise 3x per week (30 min per time)         Depression Screen    10/23/2022    1:21 PM 10/18/2022   10:04 AM 10/02/2022    2:16 PM 08/28/2022    2:32 PM 09/05/2020    2:43 PM 02/27/2020    6:54 PM  PHQ 2/9 Scores  PHQ - 2 Score 2 6 6 6 6    PHQ- 9 Score  22 17 24 24       Information is confidential and restricted. Go to Review Flowsheets to unlock data.    Fall Risk    10/23/2022    1:19 PM 10/18/2022   10:04 AM 10/02/2022    2:17 PM 08/28/2022    2:32 PM 09/05/2020    2:43 PM  Fall Risk   Falls in the past year? 1 1 0 0 0  Number falls in past yr: 1 0 0 0 0  Injury with Fall? 1 0 0 0 0  Risk for fall due to : History of fall(s);Impaired balance/gait;Orthopedic patient History of fall(s) No Fall Risks No Fall Risks No Fall Risks  Follow up Education provided;Falls prevention discussed  Falls evaluation completed Falls evaluation completed     FALL RISK PREVENTION PERTAINING TO THE HOME:  Any stairs in or around the home? Yes  If so, are there any without handrails? No  Home free of loose throw rugs in walkways, pet beds, electrical cords, etc? Yes  Adequate lighting in your home to reduce risk of falls? Yes   ASSISTIVE DEVICES UTILIZED TO PREVENT FALLS:  Life alert? No  Use of a cane, walker or w/c? No  Grab bars in the bathroom? No  Shower chair or bench in shower? No  Elevated toilet seat or a handicapped toilet? No          10/23/2022  1:23 PM  6CIT Screen  What Year? 0 points  What month? 0 points  What time? 0 points  Count back from 20 0 points  Months in reverse 0 points  Repeat phrase 0 points  Total Score 0 points     Immunizations Immunization History  Administered Date(s) Administered   Influenza Inj Mdck Quad Pf 02/05/2021   Moderna Sars-Covid-2 Vaccination 10/19/2019, 11/16/2019    TDAP status: Due, Education has been provided regarding the importance of this vaccine. Advised may receive this vaccine at local pharmacy or Health Dept. Aware to provide a copy of the vaccination record if obtained from local pharmacy or Health Dept. Verbalized acceptance and understanding.  Flu Vaccine status: Declined, Education has been provided regarding the importance of this vaccine but patient still declined. Advised may receive this vaccine at local pharmacy or Health Dept. Aware to provide a copy of the vaccination record if obtained from local pharmacy or Health Dept. Verbalized acceptance and understanding.  Pneumococcal vaccine status: Due, Education has been provided regarding the importance of this vaccine. Advised may receive this vaccine at local pharmacy or Health Dept. Aware to provide a copy of the vaccination record if obtained from local pharmacy or Health Dept. Verbalized acceptance and understanding.  Covid-19 vaccine status: Declined, Education has been provided regarding the importance of this vaccine but patient still declined. Advised may receive this vaccine at local pharmacy or Health Dept.or vaccine clinic. Aware to provide a copy of the vaccination record if obtained from local pharmacy or Health Dept. Verbalized acceptance and understanding.  Qualifies for Shingles Vaccine? Yes   Zostavax completed No   Shingrix Completed?: No.    Education has been provided regarding the importance of this vaccine. Patient has been advised to call insurance company to determine out of pocket expense if they have not yet received this vaccine. Advised may also receive vaccine at local pharmacy or Health Dept. Verbalized acceptance and understanding.  Screening Tests Health Maintenance  Topic Date Due    DTaP/Tdap/Td (1 - Tdap) Never done   COVID-19 Vaccine (3 - Moderna risk series) 12/14/2019   Zoster Vaccines- Shingrix (1 of 2) 01/17/2023 (Originally 11/29/1979)   Hepatitis C Screening  08/28/2023 (Originally 11/28/1978)   HIV Screening  08/28/2023 (Originally [birth year]1974)   INFLUENZA VACCINE  12/13/2022   MAMMOGRAM  05/15/2023   PAP SMEAR-Modifier  09/06/2023   Medicare Annual Wellness (AWV)  10/23/2023   Colonoscopy  02/03/2027   HPV VACCINES  Aged Out    Health Maintenance  Health Maintenance Due  Topic Date Due   DTaP/Tdap/Td (1 - Tdap) Never done   COVID-19 Vaccine (3 - Moderna risk series) 12/14/2019    Colorectal cancer screening: Type of screening: Colonoscopy. Completed 02/03/2020. Repeat every 7 years  Mammogram status: Completed 05/2022 [facility] per patient . Repeat every year  Bone Density status: Ordered not of age . Pt provided with contact info and advised to call to schedule appt.  Lung Cancer Screening: (Low Dose CT Chest recommended if Age 74-80 years, 30 pack-year currently smoking OR have quit w/in 15years.) does not qualify.   Lung Cancer Screening Referral: n/a  Additional Screening:  Hepatitis C Screening: does not qualify;   Vision Screening: Recommended annual ophthalmology exams for early detection of glaucoma and other disorders of the eye. Is the patient up to date with their annual eye exam?  Yes  Who is the provider or what is the name of the office in which the patient attends  annual eye exams? Dr.Martin  If pt is not established with a provider, would they like to be referred to a provider to establish care? No .   Dental Screening: Recommended annual dental exams for proper oral hygiene  Community Resource Referral / Chronic Care Management: CRR required this visit?  No   CCM required this visit?  No      Plan:     I have personally reviewed and noted the following in the patient's chart:   Medical and social history Use of  alcohol, tobacco or illicit drugs  Current medications and supplements including opioid prescriptions. Patient is not currently taking opioid prescriptions. Functional ability and status Nutritional status Physical activity Advanced directives List of other physicians Hospitalizations, surgeries, and ER visits in previous 12 months Vitals Screenings to include cognitive, depression, and falls Referrals and appointments  In addition, I have reviewed and discussed with patient certain preventive protocols, quality metrics, and best practice recommendations. A written personalized care plan for preventive services as well as general preventive health recommendations were provided to patient.     Lorrene Reid, LPN   10/16/5407   Nurse Notes: none

## 2022-10-23 NOTE — Patient Instructions (Signed)
[name] , Thank you for taking time to come for your Medicare Wellness Visit. I appreciate your ongoing commitment to your health goals. Please review the following plan we discussed and let me know if I can assist you in the future.   These are the goals we discussed:  Goals      Exercise 3x per week (30 min per time)        This is a list of the screening recommended for you and due dates:  Health Maintenance  Topic Date Due   DTaP/Tdap/Td vaccine (1 - Tdap) Never done   COVID-19 Vaccine (3 - Moderna risk series) 12/14/2019   Zoster (Shingles) Vaccine (1 of 2) 01/17/2023*   Hepatitis C Screening  08/28/2023*   HIV Screening  08/28/2023*   Flu Shot  12/13/2022   Mammogram  05/15/2023   Pap Smear  09/06/2023   Medicare Annual Wellness Visit  10/23/2023   Colon Cancer Screening  02/03/2027   HPV Vaccine  Aged Out  *Topic was postponed. The date shown is not the original due date.    Advanced directives: Advance directive discussed with you today. I have provided a copy for you to complete at home and have notarized. Once this is complete please bring a copy in to our office so we can scan it into your chart.   Conditions/risks identified: Aim for 30 minutes of exercise or brisk walking, 6-8 glasses of water, and 5 servings of fruits and vegetables each day.   Next appointment: Follow up in one year for your annual wellness visit.   Preventive Care 40-64 Years, Female Preventive care refers to lifestyle choices and visits with your health care provider that can promote health and wellness. What does preventive care include? A yearly physical exam. This is also called an annual well check. Dental exams once or twice a year. Routine eye exams. Ask your health care provider how often you should have your eyes checked. Personal lifestyle choices, including: Daily care of your teeth and gums. Regular physical activity. Eating a healthy diet. Avoiding tobacco and drug  use. Limiting alcohol use. Practicing safe sex. Taking low-dose aspirin daily starting at age 43. Taking vitamin and mineral supplements as recommended by your health care provider. What happens during an annual well check? The services and screenings done by your health care provider during your annual well check will depend on your age, overall health, lifestyle risk factors, and family history of disease. Counseling  Your health care provider may ask you questions about your: Alcohol use. Tobacco use. Drug use. Emotional well-being. Home and relationship well-being. Sexual activity. Eating habits. Work and work Astronomer. Method of birth control. Menstrual cycle. Pregnancy history. Screening  You may have the following tests or measurements: Height, weight, and BMI. Blood pressure. Lipid and cholesterol levels. These may be checked every 5 years, or more frequently if you are over 92 years old. Skin check. Lung cancer screening. You may have this screening every year starting at age 66 if you have a 30-pack-year history of smoking and currently smoke or have quit within the past 15 years. Fecal occult blood test (FOBT) of the stool. You may have this test every year starting at age 7. Flexible sigmoidoscopy or colonoscopy. You may have a sigmoidoscopy every 5 years or a colonoscopy every 10 years starting at age 59. Hepatitis C blood test. Hepatitis B blood test. Sexually transmitted disease (STD) testing. Diabetes screening. This is done by checking your blood sugar (  glucose) after you have not eaten for a while (fasting). You may have this done every 1-3 years. Mammogram. This may be done every 1-2 years. Talk to your health care provider about when you should start having regular mammograms. This may depend on whether you have a family history of breast cancer. BRCA-related cancer screening. This may be done if you have a family history of breast, ovarian, tubal, or  peritoneal cancers. Pelvic exam and Pap test. This may be done every 3 years starting at age 29. Starting at age 9, this may be done every 5 years if you have a Pap test in combination with an HPV test. Bone density scan. This is done to screen for osteoporosis. You may have this scan if you are at high risk for osteoporosis. Discuss your test results, treatment options, and if necessary, the need for more tests with your health care provider. Vaccines  Your health care provider may recommend certain vaccines, such as: Influenza vaccine. This is recommended every year. Tetanus, diphtheria, and acellular pertussis (Tdap, Td) vaccine. You may need a Td booster every 10 years. Zoster vaccine. You may need this after age 60. Pneumococcal 13-valent conjugate (PCV13) vaccine. You may need this if you have certain conditions and were not previously vaccinated. Pneumococcal polysaccharide (PPSV23) vaccine. You may need one or two doses if you smoke cigarettes or if you have certain conditions. Talk to your health care provider about which screenings and vaccines you need and how often you need them. This information is not intended to replace advice given to you by your health care provider. Make sure you discuss any questions you have with your health care provider. Document Released: 05/27/2015 Document Revised: 01/18/2016 Document Reviewed: 03/01/2015 Elsevier Interactive Patient Education  2017 [employer].    Fall Prevention in the Home Falls can cause injuries. They can happen to people of all ages. There are many things you can do to make your home safe and to help prevent falls. What can I do on the outside of my home? Regularly fix the edges of walkways and driveways and fix any cracks. Remove anything that might make you trip as you walk through a door, such as a raised step or threshold. Trim any bushes or trees on the path to your home. Use bright outdoor lighting. Clear any walking  paths of anything that might make someone trip, such as rocks or tools. Regularly check to see if handrails are loose or broken. Make sure that both sides of any steps have handrails. Any raised decks and porches should have guardrails on the edges. Have any leaves, snow, or ice cleared regularly. Use sand or salt on walking paths during winter. Clean up any spills in your garage right away. This includes oil or grease spills. What can I do in the bathroom? Use night lights. Install grab bars by the toilet and in the tub and shower. Do not use towel bars as grab bars. Use non-skid mats or decals in the tub or shower. If you need to sit down in the shower, use a plastic, non-slip stool. Keep the floor dry. Clean up any water that spills on the floor as soon as it happens. Remove soap buildup in the tub or shower regularly. Attach bath mats securely with double-sided non-slip rug tape. Do not have throw rugs and other things on the floor that can make you trip. What can I do in the bedroom? Use night lights. Make sure that you have  a light by your bed that is easy to reach. Do not use any sheets or blankets that are too big for your bed. They should not hang down onto the floor. Have a firm chair that has side arms. You can use this for support while you get dressed. Do not have throw rugs and other things on the floor that can make you trip. What can I do in the kitchen? Clean up any spills right away. Avoid walking on wet floors. Keep items that you use a lot in easy-to-reach places. If you need to reach something above you, use a strong step stool that has a grab bar. Keep electrical cords out of the way. Do not use floor polish or wax that makes floors slippery. If you must use wax, use non-skid floor wax. Do not have throw rugs and other things on the floor that can make you trip. What can I do with my stairs? Do not leave any items on the stairs. Make sure that there are handrails  on both sides of the stairs and use them. Fix handrails that are broken or loose. Make sure that handrails are as long as the stairways. Check any carpeting to make sure that it is firmly attached to the stairs. Fix any carpet that is loose or worn. Avoid having throw rugs at the top or bottom of the stairs. If you do have throw rugs, attach them to the floor with carpet tape. Make sure that you have a light switch at the top of the stairs and the bottom of the stairs. If you do not have them, ask someone to add them for you. What else can I do to help prevent falls? Wear shoes that: Do not have high heels. Have rubber bottoms. Are comfortable and fit you well. Are closed at the toe. Do not wear sandals. If you use a stepladder: Make sure that it is fully opened. Do not climb a closed stepladder. Make sure that both sides of the stepladder are locked into place. Ask someone to hold it for you, if possible. Clearly mark and make sure that you can see: Any grab bars or handrails. First and last steps. Where the edge of each step is. Use tools that help you move around (mobility aids) if they are needed. These include: Canes. Walkers. Scooters. Crutches. Turn on the lights when you go into a dark area. Replace any light bulbs as soon as they burn out. Set up your furniture so you have a clear path. Avoid moving your furniture around. If any of your floors are uneven, fix them. If there are any pets around you, be aware of where they are. Review your medicines with your doctor. Some medicines can make you feel dizzy. This can increase your chance of falling. Ask your doctor what other things that you can do to help prevent falls. This information is not intended to replace advice given to you by your health care provider. Make sure you discuss any questions you have with your health care provider. Document Released: 02/24/2009 Document Revised: 10/06/2015 Document Reviewed:  06/04/2014 Elsevier Interactive Patient Education  2017 [employer].

## 2022-10-25 ENCOUNTER — Telehealth: Payer: Self-pay

## 2022-10-25 NOTE — Telephone Encounter (Signed)
Anxiety: Patient complains of anxiety disorder and panic attacks.  She has the following symptoms: feelings of losing control, racing thoughts, shortness of breath, pacing, shaky, "felling scre" . Onset of symptoms was approximately  paitent states this  an ongoing issue x years worse the last 3 years and currently feels like it is progressively worsening since lov 10/18/2022   gradually worsening since that time. She denies current suicidal and homicidal ideation. Family history significant for depression.Possible organic causes contributing are:  patient states that she does use marijuana and consumption  of ETOH at times . Risk factors: positive family history in  mother and previous episode of depression Previous treatment includes benzodiazepines Klonopin , Seroquel, and BuSpar.  She complains of the following side effects from the treatment: none.  Patient denies anything thoughts of self harm or harming others.  She states that the [facility] does not take her insurance. [facility] called Tuesday and told her they would call her back, but she has not heard back form them yet. I spoke to [place] and she recommended that the patient reach out to them and if not able to get in with them soon or if she feels like she needs more immediate care to contact or go to the [facility] - I provided patient with contact information for both offices.

## 2022-10-26 NOTE — Telephone Encounter (Signed)
Patient called once again stating she is having a mental break down. Patient was asked if she had any thoughts of hurting her self or others she stated no. I advised her of the recommendation of yesterdays phone call to contact [facility]. Patient then said they would do nothing for her. Her husband is with her I spoke to him he states he can't take her to [facility] but he would take her to [facility].

## 2022-10-26 NOTE — Telephone Encounter (Signed)
Agree with plan that a welfare check is warranted if patient does not comply with follow up. Patient has been authorized for care for psych at [facility]. She has been provided information multiple times. [facility] [address] [place], [place] [address] [phone]

## 2022-10-26 NOTE — Telephone Encounter (Signed)
Did not see in patient chart that she went anywhere for care. Called patient and she stated she did not go anywhere that she felt better. L told her I would call the police department just to send someone out to do a welfare check and she said that was fine. Placed call to [facility] police department and they where going to send on officer out to check on patient. They will call us if they need anything and would like for [place] to call back if we have anymore information to give. FYI

## 2022-10-30 ENCOUNTER — Telehealth: Payer: Self-pay

## 2022-10-30 NOTE — Telephone Encounter (Signed)
FYI, Patient called stating she is having a nervous breakdown and does not know what to do.  Patient reports she does not plan to harm herself or others.  Patient was asked if she had contacted [facility] and she has not.  The phone number and information was given to her again and she was asked to contact them to arrange a visit.  She was advised if she cannot get in to seem them today she should either go to [facility] urgent care or to the ER.  Patient was told she cannot continue to contact our office about this that she must make arrangements to be seen by mental health professionals.  Patient has called our office multiple times about this but has thus far refused or not called any mental health professional we have recommended to her.

## 2022-10-30 NOTE — Telephone Encounter (Signed)
See if she will see Esmond Harps psycologist who comes here.

## 2022-10-30 NOTE — Telephone Encounter (Signed)
CALLED PATIENT, NO ANSWER °

## 2022-11-07 ENCOUNTER — Telehealth: Payer: Self-pay | Admitting: *Deleted

## 2022-11-07 NOTE — Progress Notes (Signed)
  Care Coordination   Note   11/07/2022 Name: Kanitra Purifoy MRN: [id number] DOB: 1960/09/02  Aylee Littrell is a 62 y.o. year old female who sees Milian, Aleen Campi, FNP for primary care. I reached out to [name] by phone today to offer care coordination services.  Ms. Radman was given information about Care Coordination services today including:   The Care Coordination services include support from the care team which includes your Nurse Coordinator, Clinical Social Worker, or Pharmacist.  The Care Coordination team is here to help remove barriers to the health concerns and goals most important to you. Care Coordination services are voluntary, and the patient may decline or stop services at any time by request to their care team member.   Care Coordination Consent Status: Patient did not agree to participate in care coordination services at this time.    Encounter Outcome:  Pt. Refused  [facility] Coordination Care Guide  Direct Dial: [phone]

## 2022-11-14 ENCOUNTER — Telehealth (INDEPENDENT_AMBULATORY_CARE_PROVIDER_SITE_OTHER): Payer: Self-pay | Admitting: *Deleted

## 2022-11-14 ENCOUNTER — Other Ambulatory Visit (INDEPENDENT_AMBULATORY_CARE_PROVIDER_SITE_OTHER): Payer: Self-pay | Admitting: Gastroenterology

## 2022-11-14 DIAGNOSIS — R197 Diarrhea, unspecified: Secondary | ICD-10-CM

## 2022-11-14 MED ORDER — DIPHENOXYLATE-ATROPINE 2.5-0.025 MG PO TABS
1.0000 | ORAL_TABLET | Freq: Four times a day (QID) | ORAL | 1 refills | Status: DC | PRN
Start: 2022-11-14 — End: 2023-07-22
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
## 2022-11-14 NOTE — Telephone Encounter (Signed)
Discussed with patient per Dawn Kelley - I have sent a refill of lomotil, appears she should have refills on protonix, she should get this refilled at her pharmacy and take once daily. I can send another Rx for carafate if she does not think she has any on hand.  Patient verbalized understanding.

## 2022-11-19 ENCOUNTER — Ambulatory Visit (INDEPENDENT_AMBULATORY_CARE_PROVIDER_SITE_OTHER): Payer: 59 | Admitting: Professional Counselor

## 2022-11-19 DIAGNOSIS — F331 Major depressive disorder, recurrent, moderate: Secondary | ICD-10-CM

## 2022-11-19 NOTE — Patient Instructions (Signed)
If your symptoms worsen or you have thoughts of suicide/homicide, PLEASE SEEK IMMEDIATE MEDICAL ATTENTION.  You may always call:   National Suicide Hotline: 988 or 800-273-8255 [place] Crisis Line: 336-832-9700 Crisis Recovery in [place]: 800-939-5911      These are available 24 hours a day, 7 days a week.  
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
## 2022-11-21 ENCOUNTER — Telehealth (INDEPENDENT_AMBULATORY_CARE_PROVIDER_SITE_OTHER): Payer: Self-pay | Admitting: Professional Counselor

## 2022-11-21 DIAGNOSIS — F331 Major depressive disorder, recurrent, moderate: Secondary | ICD-10-CM

## 2022-11-21 NOTE — BH Specialist Note (Unsigned)
Virtual Behavioral Health Treatment Plan Team Note  MRN: [id number] NAME: Dawn Kelley  DATE: 11/21/22  Start time: Start Time: 0240 End time:   Total time:    Total number of Virtual BH Treatment Team Plan encounters: 1/4  Treatment Team Attendees: Dr. Vanetta Shawl and Esmond Harps  Diagnoses: No diagnosis found.  Goals, Interventions and Follow-up Plan Goals: Increase healthy adjustment to current life circumstances Interventions: CBT Cognitive Behavioral Therapy Medication Management Recommendations:  Follow-up Plan: Refer to psychiatry and recommend substance abuse treatment.   History of the present illness Presenting Problem/Current Symptoms: ***  Psychiatric History  Depression: {BHH YES OR NO:22294} Anxiety: {BHH YES OR NO:22294} Mania: {BHH YES OR NO:22294} Psychosis: {BHH YES OR NO:22294} PTSD symptoms: {BHH YES OR NO:22294}  Past Psychiatric History/Hospitalization(s): Hospitalization for psychiatric illness: {BHH YES OR NO:22294} Prior Suicide Attempts: {BHH YES OR NO:22294} Prior Self-injurious behavior: {BHH YES OR WC:37628}  Psychosocial stressors   Self-harm Behaviors Risk Assessment   Screenings PHQ-9 Assessments:     11/19/2022    1:42 PM 10/23/2022    1:21 PM 10/18/2022   10:04 AM  Depression screen PHQ 2/9  Decreased Interest 3 1 3   Down, Depressed, Hopeless  1 3  PHQ - 2 Score 3 2 6   Altered sleeping 2  2  Tired, decreased energy 3  2  Change in appetite 3  3  Feeling bad or failure about yourself  3  3  Trouble concentrating 3  3  Moving slowly or fidgety/restless 0  3  Suicidal thoughts 0  0  PHQ-9 Score 17  22  Difficult doing work/chores Somewhat difficult  Extremely dIfficult   GAD-7 Assessments:     11/19/2022    1:43 PM 10/18/2022   10:05 AM 10/02/2022    2:17 PM 08/28/2022    2:32 PM  GAD 7 : Generalized Anxiety Score  Nervous, Anxious, on Edge 3 3 3 3   Control/stop worrying 3 3 3 3   Worry too much - different things 3 3 3 3    Trouble relaxing 2 3 3 3   Restless 2 2 3 3   Easily annoyed or irritable 2 3 3 3   Afraid - awful might happen 2 3 3 3   Total GAD 7 Score 17 20 21 21   Anxiety Difficulty Somewhat difficult Extremely difficult Somewhat difficult Very difficult    Past Medical History Past Medical History:  Diagnosis Date   Anxiety    Bipolar 1 disorder (HCC)    Depression    OCD (obsessive compulsive disorder)    PTSD (post-traumatic stress disorder)    S/P ECT (electroconvulsive therapy)     Vital signs: There were no vitals filed for this visit.  Allergies:  Allergies as of 11/21/2022 - Review Complete 10/23/2022  Allergen Reaction Noted   Escitalopram Nausea And Vomiting 12/28/2019   Seroquel [quetiapine fumarate] Shortness Of Breath 09/26/2019   Trazodone Other (See Comments) 02/15/2020    Medication History Current medications:  Outpatient Encounter Medications as of 11/21/2022  Medication Sig   busPIRone (BUSPAR) 30 MG tablet Take 30 mg by mouth. One at bedtime   clonazePAM (KLONOPIN) 1 MG tablet Take 1 mg by mouth 2 (two) times daily as needed for anxiety.   diphenoxylate-atropine (LOMOTIL) 2.5-0.025 MG tablet Take 1 tablet by mouth 4 (four) times daily as needed for diarrhea or loose stools (diarrhea). Prn.   hydrOXYzine (ATARAX) 25 MG tablet Take 1 tablet (25 mg total) by mouth every 8 (eight) hours as needed. May make drowsy,  no driving when taking   levothyroxine (SYNTHROID) 25 MCG tablet Take 25 mcg by mouth daily before breakfast.   losartan (COZAAR) 25 MG tablet Take 25 mg by mouth daily.   mirtazapine (REMERON) 45 MG tablet Take 45 mg by mouth at bedtime.   naltrexone (DEPADE) 50 MG tablet Take 50 mg by mouth daily.   OLANZapine (ZYPREXA) 15 MG tablet Take 15 mg by mouth at bedtime.   ondansetron (ZOFRAN) 4 MG tablet Take 1 tablet (4 mg total) by mouth every 8 (eight) hours as needed for nausea or vomiting.   pantoprazole (PROTONIX) 40 MG tablet TAKE 1 TABLET ONCE DAILY.    propranolol (INDERAL) 20 MG tablet Take 20 mg by mouth 3 (three) times daily.   QUEtiapine (SEROQUEL) 25 MG tablet Take 25 mg by mouth.   rosuvastatin (CRESTOR) 20 MG tablet Take 20 mg by mouth daily.   sucralfate (CARAFATE) 1 GM/10ML suspension Take 10 mLs (1 g total) by mouth 4 (four) times daily.   topiramate (TOPAMAX) 25 MG tablet Take 25 mg by mouth 2 (two) times daily.   Vilazodone HCl (VIIBRYD) 40 MG TABS Take 40 mg by mouth daily.   No facility-administered encounter medications on file as of 11/21/2022.     Scribe for Treatment Team: Reuel Boom

## 2022-11-26 ENCOUNTER — Ambulatory Visit (INDEPENDENT_AMBULATORY_CARE_PROVIDER_SITE_OTHER): Payer: 59 | Admitting: Professional Counselor

## 2022-11-26 DIAGNOSIS — F331 Major depressive disorder, recurrent, moderate: Secondary | ICD-10-CM

## 2022-11-26 NOTE — BH Specialist Note (Addendum)
Integrated Behavioral Health via Telemedicine Visit  11/27/2022 Dawn Kelley [id number]  Number of Integrated Behavioral Health Clinician visits: 2- Second Visit  Session Start time: 0100   Session End time: 0130  Total time in minutes: 30   Referring Provider: Garth Schlatter Patient/Family location: In [facility] Provider location: Office All persons participating in visit: patient and bhc Types of Service: Collaborative care  I connected with Dawn Kelley  via Telephone or Video Enabled Telemedicine Application  (Video is Caregility application) and verified that I am speaking with the correct person using two identifiers. Discussed confidentiality: Yes   I discussed the limitations of telemedicine and the availability of in person appointments.  Discussed there is a possibility of technology failure and discussed alternative modes of communication if that failure occurs.  I discussed that engaging in this telemedicine visit, they consent to the provision of behavioral healthcare and the services will be billed under their insurance.  Patient expressed understanding and consented to Telemedicine visit: Yes   Presenting Concerns: Patient presented to session virtually at her request. Patient reports continued heightened anxiety explaining that she "is not having a good day". Patient denies thoughts of harming self or others. Patients husband confirmed her safety.  [facility] met with patient today to explain results of the collaborative care psychiatric consultation. [facility] explained to patient that she is not appropriate for collaborative care due to the fact she is already under the care of [facility] for medication management and that her current alcohol use impacts the ability effectively treat her mental health symptoms. [facility] recommended the patient to continue with [facility] for medication management and to seek pharmacological treatment for alcohol use. Patient verbalized  an understanding of these recommendations. [facility] will provide patient with additional resources to aid her with locating treatment options.    Patient and/or Family's Strengths/Protective Factors: Supportive husband who was with her during this call.  I discussed the assessment and treatment plan with the patient and/or parent/guardian. They were provided an opportunity to ask questions and all were answered. They agreed with the plan and demonstrated an understanding of the instructions.   They were advised to call back or seek an in-person evaluation if the symptoms worsen or if the condition fails to improve as anticipated.  Dawn Kelley

## 2022-11-26 NOTE — BH Specialist Note (Incomplete Revision)
Integrated Behavioral Health via Telemedicine Visit  11/27/2022 Dawn Kelley [id number]  Number of Integrated Behavioral Health Clinician visits: 2- Second Visit  Session Start time: 0100   Session End time: 0130  Total time in minutes: 30   Referring Provider: Garth Schlatter Patient/Family location: In [facility] Provider location: Office All persons participating in visit: patient and bhc Types of Service: Collaborative care  I connected with Dawn Kelley  via Telephone or Video Enabled Telemedicine Application  (Video is Caregility application) and verified that I am speaking with the correct person using two identifiers. Discussed confidentiality: Yes   I discussed the limitations of telemedicine and the availability of in person appointments.  Discussed there is a possibility of technology failure and discussed alternative modes of communication if that failure occurs.  I discussed that engaging in this telemedicine visit, they consent to the provision of behavioral healthcare and the services will be billed under their insurance.  Patient expressed understanding and consented to Telemedicine visit: Yes   Presenting Concerns: Patient presented to session virtually at her request. Patient reports continued heightened anxiety explaining that she "is not having a good day". Patient denies thoughts of harming self or others. Patients husband confirmed her safety.  [facility] met with patient today to explain results of the collaborative care psychiatric consultation. [facility] explained to patient that she is not appropriate for collaborative care due to the fact she is already under the care of [facility] for medication management and that her current alcohol use impacts the ability effectively treat her mental health symptoms. [facility] recommended the patient to continue with [facility] for medication management and to seek pharmacological treatment for alcohol use. Patient verbalized  an understanding of these recommendations. [facility] will provide patient with additional resources to aid her with locating treatment options.    Patient and/or Family's Strengths/Protective Factors: Supportive husband who was with her during this call.  I discussed the assessment and treatment plan with the patient and/or parent/guardian. They were provided an opportunity to ask questions and all were answered. They agreed with the plan and demonstrated an understanding of the instructions.   They were advised to call back or seek an in-person evaluation if the symptoms worsen or if the condition fails to improve as anticipated.  Dawn Kelley

## 2022-11-26 NOTE — Patient Instructions (Signed)
[facility] will provide additional resources to patient of treatment options for alcohol use.   1) [facility] [address]  [place], [address] [phone] 24 hour/7 days a week walk-in assessments  2) [facility] [address]  [place], [place] [address] [phone] Offers detox and residential treatment  If your symptoms worsen or you have thoughts of suicide/homicide, PLEASE SEEK IMMEDIATE MEDICAL ATTENTION.  You may always call:   National Suicide Hotline: 988 or 6074733394 [place] Crisis Line: [phone])400-4437 Crisis Recovery in [place]: 5082605815     These are available 24 hours a day, 7 days a week.

## 2022-11-27 ENCOUNTER — Encounter: Payer: Self-pay | Admitting: Family Medicine

## 2022-11-27 ENCOUNTER — Ambulatory Visit: Payer: 59 | Admitting: Family Medicine

## 2022-11-27 VITALS — BP 125/84 | HR 72 | Temp 98.3°F | Ht 66.0 in | Wt 202.0 lb

## 2022-11-27 DIAGNOSIS — R7303 Prediabetes: Secondary | ICD-10-CM | POA: Diagnosis not present

## 2022-11-27 DIAGNOSIS — Z79891 Long term (current) use of opiate analgesic: Secondary | ICD-10-CM | POA: Diagnosis not present

## 2022-11-27 DIAGNOSIS — R3589 Other polyuria: Secondary | ICD-10-CM

## 2022-11-27 DIAGNOSIS — F339 Major depressive disorder, recurrent, unspecified: Secondary | ICD-10-CM

## 2022-11-27 DIAGNOSIS — F1090 Alcohol use, unspecified, uncomplicated: Secondary | ICD-10-CM

## 2022-11-27 DIAGNOSIS — F411 Generalized anxiety disorder: Secondary | ICD-10-CM | POA: Diagnosis not present

## 2022-11-27 DIAGNOSIS — F1990 Other psychoactive substance use, unspecified, uncomplicated: Secondary | ICD-10-CM

## 2022-11-27 DIAGNOSIS — R6889 Other general symptoms and signs: Secondary | ICD-10-CM | POA: Diagnosis not present

## 2022-11-27 DIAGNOSIS — R42 Dizziness and giddiness: Secondary | ICD-10-CM | POA: Diagnosis not present

## 2022-11-27 LAB — BAYER DCA HB A1C WAIVED: HB A1C (BAYER DCA - WAIVED): 5.7 % — ABNORMAL HIGH (ref 4.8–5.6)

## 2022-11-27 NOTE — Progress Notes (Signed)
Acute Office Visit  Subjective:  Patient ID: Dawn Kelley, female    DOB: May 11, 1961, 62 y.o.   MRN: [id number]  Chief Complaint  Patient presents with   Dizziness   Dizziness Patient presents today with husband, who contributes to patient history. Patient is in today for dizziness and is concerned that she has diabetes. States that she feels fatigued, feels like she is going to pass out, and has increased urine frequency. Reports that she is dizzy intermittently.  Alcohol Use  States that this started 3-4 months ago. Alcohol 2-3 12 oz cans daily.   Substance Use  Endorses Marijuana use daily.   Anxiety/Depression/PTSD Patient states that she does not feel well. Repeatedly asked "what am I going to do? I can't function like this" She is not able to describe her situation or symptoms due to nervousness.  Endorses thoughts of harming herself or being better off dead. Denies active plan to harm herself. Patient asked if seroquel would be beneficial for her as her husband takes it and it helps him. Clarified with patient that this medication is on her med list, pt states that she is concerned about taking it as she does not think it is working. Patient states that she has been detoxed twice this past year in hospital. Husband supports this history, clarifies that she was discharged on the same medications as when she presented to hospital. He believes that a detox would be beneficial for her. Patient asked multiple times during visit for a medication for her "nerves".   Esmond Harps, [facility] specialist present for visit to assist with questions and providing resources.   Review of Systems  Neurological:  Positive for dizziness.   Objective:  BP 125/84   Pulse 72   Temp 98.3 F (36.8 C)   Ht 5\' 6"  (1.676 m)   Wt 202 lb (91.6 kg)   SpO2 98%   BMI 32.60 kg/m   Physical Exam Constitutional:      General: She is in acute distress.     Appearance: She is well-developed. She is  ill-appearing and diaphoretic. She is not toxic-appearing.  Cardiovascular:     Rate and Rhythm: Normal rate and regular rhythm.     Pulses:          Radial pulses are 2+ on the right side and 2+ on the left side.       Posterior tibial pulses are 2+ on the right side and 2+ on the left side.     Heart sounds: Normal heart sounds.  Pulmonary:     Effort: Tachypnea present.     Breath sounds: Normal breath sounds. No stridor, decreased air movement or transmitted upper airway sounds. No decreased breath sounds, wheezing, rhonchi or rales.  Skin:    General: Skin is warm.     Capillary Refill: Capillary refill takes less than 2 seconds.  Neurological:     General: No focal deficit present.     Mental Status: She is alert.  Psychiatric:        Mood and Affect: Mood is anxious. Mood is not depressed. Affect is inappropriate.        Speech: Speech is rapid and pressured and tangential.        Behavior: Behavior is hyperactive.        Thought Content: Thought content includes suicidal ideation. Thought content does not include homicidal ideation. Thought content does not include homicidal or suicidal plan.       11/27/2022  10:15 AM 11/19/2022    1:42 PM 10/23/2022    1:21 PM  Depression screen PHQ 2/9  Decreased Interest 3 3 1   Down, Depressed, Hopeless 3  1  PHQ - 2 Score 6 3 2   Altered sleeping 2 2   Tired, decreased energy 3 3   Change in appetite 3 3   Feeling bad or failure about yourself  3 3   Trouble concentrating 3 3   Moving slowly or fidgety/restless 2 0   Suicidal thoughts 0 0   PHQ-9 Score 22 17   Difficult doing work/chores Very difficult Somewhat difficult        11/27/2022   10:15 AM 11/19/2022    1:43 PM 10/18/2022   10:05 AM 10/02/2022    2:17 PM  GAD 7 : Generalized Anxiety Score  Nervous, Anxious, on Edge 2 3 3 3   Control/stop worrying 2 3 3 3   Worry too much - different things 2 3 3 3   Trouble relaxing 2 2 3 3   Restless 2 2 2 3   Easily annoyed or irritable  2 2 3 3   Afraid - awful might happen 0 2 3 3   Total GAD 7 Score 12 17 20 21   Anxiety Difficulty Very difficult Somewhat difficult Extremely difficult Somewhat difficult   Assessment & Plan:  1. Dizziness Labs as below. Will communicate results to patient once available. Discussed deep breathing exercises with patient and use of ice pack to slow breathing and panic. Esmond Harps, provided husband and patient with written and verbal instructions.  - Anemia Profile B - CMP14+EGFR  2. Depression, recurrent (HCC) Discussed with patient to follow up with psych. Patient has been referred multiple times. Has authorized referral with [facility]. Patient stated that she used to go there but did not like the rooms because they were too small. Discussed at length with patient and husband that we recommend a monitored detox and follow up with psychiatry due to substance use and new alcohol use. Husband is agreeable. Provided patient and husband with written and verbal instructions. Patient continued to ask for medication for anxiety. Reiterated to patient that I would not be prescribing antianxiety or depression medications as she sees psychiatry and with substance use it is unsafe for me to prescribe medications for her. Reviewed PDMP. Reviewed toxassures. Patient was negative for benzodiazepines 2 months ago and 5 months ago, while per PDMP she had supply of klonopin. Patient and husband state that they are taking medication as prescribed from pre-packaged med pack. Will complete toxassure as below.  Denies active plans of self harm. Provided patient with urgent care behavioral health resources.   3. Generalized anxiety disorder As above.   4. Substance use disorder Labs as below. Will communicate results to patient once available.  Reiterated to patient that it is unsafe to mix her medications with alcohol and other substances.   - Drug Screen 10 W/Conf, Serum  5. Alcohol use disorder Labs as  below. Will communicate results to patient once available. Reiterated to patient that it is unsafe to mix her medications with alcohol and other substances.   - Drug Screen 10 W/Conf, Serum  6. Polyuria Labs as below. Will communicate results to patient once available.  - Bayer DCA Hb A1c Waived - Urinalysis, Routine w reflex microscopic  The above assessment and management plan was discussed with the patient. The patient verbalized understanding of and has agreed to the management plan using shared-decision making. Patient is aware to call  the clinic if they develop any new symptoms or if symptoms fail to improve or worsen. Patient is aware when to return to the clinic for a follow-up visit. Patient educated on when it is appropriate to go to the emergency department.   Return if symptoms worsen or fail to improve.  Neale Burly, DNP-FNP [facility] [address] [place], [place] [address] [phone]

## 2022-11-28 LAB — CMP14+EGFR
ALT: 39 IU/L — ABNORMAL HIGH (ref 0–32)
AST: 33 IU/L (ref 0–40)
Albumin: 4.3 g/dL (ref 3.9–4.9)
Alkaline Phosphatase: 99 IU/L (ref 44–121)
BUN/Creatinine Ratio: 10 — ABNORMAL LOW (ref 12–28)
BUN: 6 mg/dL — ABNORMAL LOW (ref 8–27)
Bilirubin Total: <0.2 mg/dL (ref 0.0–1.2)
CO2: 20 mmol/L (ref 20–29)
Calcium: 9.4 mg/dL (ref 8.7–10.3)
Chloride: 104 mmol/L (ref 96–106)
Creatinine, Ser: 0.63 mg/dL (ref 0.57–1.00)
Globulin, Total: 2.7 g/dL (ref 1.5–4.5)
Glucose: 97 mg/dL (ref 70–99)
Potassium: 4.1 mmol/L (ref 3.5–5.2)
Sodium: 141 mmol/L (ref 134–144)
Total Protein: 7 g/dL (ref 6.0–8.5)
eGFR: 100 mL/min/{1.73_m2} (ref >59)

## 2022-11-28 LAB — ANEMIA PROFILE B
Basophils Absolute: 0 10*3/uL (ref 0.0–0.2)
Basos: 1 %
EOS (ABSOLUTE): 0 10*3/uL (ref 0.0–0.4)
Eos: 1 %
Ferritin: 148 ng/mL (ref 15–150)
Folate: 6.2 ng/mL (ref >3.0)
Hematocrit: 42 % (ref 34.0–46.6)
Hemoglobin: 14.3 g/dL (ref 11.1–15.9)
Immature Grans (Abs): 0 10*3/uL (ref 0.0–0.1)
Immature Granulocytes: 0 %
Iron Saturation: 26 % (ref 15–55)
Iron: 87 ug/dL (ref 27–139)
Lymphocytes Absolute: 2.7 10*3/uL (ref 0.7–3.1)
Lymphs: 35 %
MCH: 31.1 pg (ref 26.6–33.0)
MCHC: 34 g/dL (ref 31.5–35.7)
MCV: 91 fL (ref 79–97)
Monocytes Absolute: 0.3 10*3/uL (ref 0.1–0.9)
Monocytes: 4 %
Neutrophils Absolute: 4.6 10*3/uL (ref 1.4–7.0)
Neutrophils: 59 %
Platelets: 279 10*3/uL (ref 150–450)
RBC: 4.6 x10E6/uL (ref 3.77–5.28)
RDW: 14.9 % (ref 11.7–15.4)
Retic Ct Pct: 1.9 % (ref 0.6–2.6)
Total Iron Binding Capacity: 340 ug/dL (ref 250–450)
UIBC: 253 ug/dL (ref 118–369)
Vitamin B-12: 505 pg/mL (ref 232–1245)
WBC: 7.7 10*3/uL (ref 3.4–10.8)

## 2022-11-29 LAB — DRUG SCREEN 10 W/CONF, SERUM
Amphetamines, IA: NEGATIVE ng/mL
Barbiturates, IA: NEGATIVE ug/mL
Cocaine & Metabolite, IA: NEGATIVE ng/mL
Oxycodones, IA: NEGATIVE ng/mL
Propoxyphene, IA: NEGATIVE ng/mL

## 2022-11-30 ENCOUNTER — Encounter: Payer: Self-pay | Admitting: Family Medicine

## 2022-11-30 DIAGNOSIS — R7303 Prediabetes: Secondary | ICD-10-CM | POA: Insufficient documentation

## 2022-11-30 NOTE — Progress Notes (Signed)
BUN and BUN/Crt slightly decreased. May be a sign of dehydration. Recommend 80-100 oz of water daily. ALT slightly elevated, this is a liver enzyme. Given that other liver enzymes are normal and this is slightly elevated, will repeat in 3 months to monitor trend. Awaiting the results of drug screen. Anemia profile normal. A1C slightly elevated, in prediabetes range. Recommend diet and lifestyle changes. Diet encouraged - increase intake of fresh fruits and vegetables, increase intake of lean proteins. Bake, broil, or grill foods. Avoid fried, greasy, and fatty foods. Avoid fast foods. Increase intake of fiber-rich whole grains. Exercise encouraged - at least 150 minutes per week and advance as tolerated. Will repeat lab in 3 months.

## 2022-12-05 ENCOUNTER — Telehealth: Payer: Self-pay

## 2022-12-05 LAB — DRUG SCREEN 10 W/CONF, SERUM
Benzodiazepines, IA: POSITIVE ng/mL — AB
Methadone, IA: NEGATIVE ng/mL
Opiates, IA: NEGATIVE ng/mL
Phencyclidine, IA: NEGATIVE ng/mL

## 2022-12-05 LAB — THC,MS,WB/SP RFX
Cannabidiol: 2.2 ng/mL
Cannabinoid Confirmation: POSITIVE
Carboxy-THC: 186.7 ng/mL
Hydroxy-THC: 7.3 ng/mL
Tetrahydrocannabinol(THC): 22.7 ng/mL

## 2022-12-05 LAB — BENZODIAZEPINES,MS,WB/SP RFX
7-Aminoclonazepam: 16 ng/mL
Alprazolam: NEGATIVE ng/mL
Benzodiazepines Confirm: POSITIVE
Chlordiazepoxide: NEGATIVE
Clonazepam: 22.6 ng/mL
Desalkylflurazepam: NEGATIVE ng/mL
Desmethylchlordiazepoxide: NEGATIVE
Desmethyldiazepam: NEGATIVE ng/mL
Diazepam: NEGATIVE ng/mL
Flurazepam: NEGATIVE ng/mL
Lorazepam: NEGATIVE ng/mL
Midazolam: NEGATIVE ng/mL
Oxazepam: NEGATIVE ng/mL
Temazepam: NEGATIVE ng/mL
Triazolam: NEGATIVE ng/mL

## 2022-12-05 NOTE — Telephone Encounter (Signed)
Pt states that the Behavioral Health facility she was referred to does not accept her insurance.  Toni Amend, can you help with this please?

## 2022-12-05 NOTE — Telephone Encounter (Signed)
Pt called this morning crying stating that we have referred her to psych but they do not take her insurance. Informed pt that I would reach out to our referrals department today.  Pt states that she does not know where to go. Advised if she felt that bad that she should go to the ER. [facility] good option since [facility] is across the street. However, pt does not like them. Suggested [facility] or [facility] at that time. Pt denies any suicidal thoughts. States that her husband is with her and he is supportive.  Pt denies changing any medications since being in our office.   New telephone encounter will be created for referrals to review for psych referral that did not take her insurance per pt.

## 2022-12-11 NOTE — Telephone Encounter (Signed)
Pt made aware and given number to Psych office. [phone]

## 2022-12-11 NOTE — Telephone Encounter (Signed)
I have redirected her Referral to [facility] :)

## 2022-12-14 ENCOUNTER — Telehealth: Payer: Self-pay | Admitting: Family Medicine

## 2022-12-14 DIAGNOSIS — F411 Generalized anxiety disorder: Secondary | ICD-10-CM

## 2022-12-14 NOTE — Telephone Encounter (Signed)
Pt wants to be referred for mental/behavioral health for all of her nervousness and breakdowns she has, unless PCP will prescribe her medicine for it. Please advise.

## 2022-12-14 NOTE — Progress Notes (Signed)
Positive for benzodiazepines. Confirmed not in withdrawal. Positive for marijuana. Recommend patient continue to follow up with psychiatry and follow up with [facility] as discussed.

## 2022-12-19 DIAGNOSIS — T50904A Poisoning by unspecified drugs, medicaments and biological substances, undetermined, initial encounter: Secondary | ICD-10-CM | POA: Diagnosis not present

## 2022-12-19 DIAGNOSIS — T887XXA Unspecified adverse effect of drug or medicament, initial encounter: Secondary | ICD-10-CM | POA: Diagnosis not present

## 2022-12-24 DIAGNOSIS — E038 Other specified hypothyroidism: Secondary | ICD-10-CM | POA: Diagnosis not present

## 2022-12-24 DIAGNOSIS — I700 Atherosclerosis of aorta: Secondary | ICD-10-CM | POA: Diagnosis not present

## 2022-12-24 DIAGNOSIS — I10 Essential (primary) hypertension: Secondary | ICD-10-CM | POA: Diagnosis not present

## 2022-12-31 ENCOUNTER — Ambulatory Visit (INDEPENDENT_AMBULATORY_CARE_PROVIDER_SITE_OTHER): Payer: 59 | Admitting: Gastroenterology

## 2023-03-01 ENCOUNTER — Other Ambulatory Visit: Payer: 59

## 2023-04-08 ENCOUNTER — Telehealth (INDEPENDENT_AMBULATORY_CARE_PROVIDER_SITE_OTHER): Payer: Self-pay | Admitting: *Deleted

## 2023-04-08 ENCOUNTER — Emergency Department (HOSPITAL_COMMUNITY)
Admission: EM | Admit: 2023-04-08 | Discharge: 2023-04-08 | Disposition: A | Discharge status: Home / Self Care | Payer: 59 | Source: Home / Self Care

## 2023-04-08 DIAGNOSIS — Z743 Need for continuous supervision: Secondary | ICD-10-CM | POA: Diagnosis not present

## 2023-04-08 DIAGNOSIS — R197 Diarrhea, unspecified: Secondary | ICD-10-CM | POA: Diagnosis not present

## 2023-04-08 NOTE — Telephone Encounter (Signed)
Please ask her to take Imodium, needs visit in office to discuss symptoms

## 2023-04-08 NOTE — Telephone Encounter (Signed)
Discussed with patient she can use imodium otc and she needs office visit. Dawn Kelley patient said to call  her for an appt. She states she can come anytime.

## 2023-04-08 NOTE — Telephone Encounter (Signed)
Left message to return call 

## 2023-04-08 NOTE — Telephone Encounter (Signed)
Pt called states she having diarrhea for 9 -10 months. Getting worse. Has 2 -3 episodes per day. Has had accidents when out in public due to urgency. No blood or mucus in stool. Has abdominal pain on both right and left side that is constant sharp pain that started 2 -3 weeks ago. She states lomotil is not helping.   Lat seen by chelsea 09/10/22. No upcoming appt.   Dawn Kelley

## 2023-04-09 NOTE — Progress Notes (Deleted)
GI Office Note    Referring Provider: Arrie Senate* Primary Care Physician:  Arrie Senate, FNP Primary Gastroenterologist: *** Date:  04/09/2023  ID:  Dawn Kelley, DOB [birth year], MRN [id number]   Chief Complaint   No chief complaint on file.   History of Present Illness  Dawn Kelley is a 62 y.o. female with a history of *** presenting today with complaint of   Colonoscopy September 2021: -3 mm tubular adenoma in the sigmoid colon -Hemorrhoids on perianal exam -Repeat colonoscopy in 7 years.  EGD September 2021: -Normal esophagus -Small gastric polyps, sessile and fundic gland -Normal duodenum s/p biopsy -Duodenal biopsies were benign   Last office visit April 2024. ***. Patient recommended to undergo EGD and colonoscopy for further evaluation of her nausea, vomiting, and chronic diarrhea.    Today:    Wt Readings from Last 3 Encounters:  11/27/22 202 lb (91.6 kg)  10/23/22 200 lb (90.7 kg)  10/18/22 201 lb (91.2 kg)    Current Outpatient Medications  Medication Sig Dispense Refill   busPIRone (BUSPAR) 30 MG tablet Take 30 mg by mouth. One at bedtime     clonazePAM (KLONOPIN) 1 MG tablet Take 1 mg by mouth 2 (two) times daily as needed for anxiety.     diphenoxylate-atropine (LOMOTIL) 2.5-0.025 MG tablet Take 1 tablet by mouth 4 (four) times daily as needed for diarrhea or loose stools (diarrhea). Prn. 120 tablet 1   hydrOXYzine (ATARAX) 25 MG tablet Take 1 tablet (25 mg total) by mouth every 8 (eight) hours as needed. May make drowsy, no driving when taking 15 tablet 0   levothyroxine (SYNTHROID) 25 MCG tablet Take 25 mcg by mouth daily before breakfast.     losartan (COZAAR) 25 MG tablet Take 25 mg by mouth daily.     mirtazapine (REMERON) 45 MG tablet Take 45 mg by mouth at bedtime.     naltrexone (DEPADE) 50 MG tablet Take 50 mg by mouth daily.     OLANZapine (ZYPREXA) 15 MG tablet Take 15 mg by mouth at bedtime.     ondansetron  (ZOFRAN) 4 MG tablet Take 1 tablet (4 mg total) by mouth every 8 (eight) hours as needed for nausea or vomiting. 90 tablet 0   pantoprazole (PROTONIX) 40 MG tablet TAKE 1 TABLET ONCE DAILY. 90 tablet 2   propranolol (INDERAL) 20 MG tablet Take 20 mg by mouth 3 (three) times daily.     QUEtiapine (SEROQUEL) 25 MG tablet Take 25 mg by mouth.     rosuvastatin (CRESTOR) 20 MG tablet Take 20 mg by mouth daily.     sucralfate (CARAFATE) 1 GM/10ML suspension Take 10 mLs (1 g total) by mouth 4 (four) times daily. 414 mL 1   topiramate (TOPAMAX) 25 MG tablet Take 25 mg by mouth 2 (two) times daily.     Vilazodone HCl (VIIBRYD) 40 MG TABS Take 40 mg by mouth daily.     No current facility-administered medications for this visit.    Past Medical History:  Diagnosis Date   Anxiety    Bipolar 1 disorder (HCC)    Depression    OCD (obsessive compulsive disorder)    PTSD (post-traumatic stress disorder)    S/P ECT (electroconvulsive therapy)     Past Surgical History:  Procedure Laterality Date   BIOPSY  02/03/2020   Procedure: BIOPSY;  Surgeon: Dolores Frame, MD;  Location: [facility];  Service: Gastroenterology;;   COLONOSCOPY WITH PROPOFOL N/A 02/03/2020  [name]: 3mm polyp in sigmoid colon  as well as hemorrhoids, histology consistent with tubular adenoma   ESOPHAGOGASTRODUODENOSCOPY (EGD) WITH PROPOFOL N/A 02/03/2020   castaneda: small gastric polyps, sessile, and fundic gland, small bowel bx normal   INDUCED ABORTION N/A 1980   POLYPECTOMY  02/03/2020   Procedure: POLYPECTOMY;  Surgeon: Marguerita Merles, Reuel Boom, MD;  Location: [facility];  Service: Gastroenterology;;   VEIN SURGERY      Family History  Problem Relation Age of Onset   Depression Mother    Pneumonia Mother     Allergies as of 04/10/2023 - Review Complete 11/27/2022  Allergen Reaction Noted   Escitalopram Nausea And Vomiting 12/28/2019   Seroquel [quetiapine fumarate] Shortness Of Breath  09/26/2019   Trazodone Other (See Comments) 02/15/2020    Social History   Socioeconomic History   Marital status: Married    Spouse name: Not on file   Number of children: Not on file   Years of education: Not on file   Highest education level: Not on file  Occupational History   Occupation: Disabled  Tobacco Use   Smoking status: Never    Passive exposure: Current   Smokeless tobacco: Never  Vaping Use   Vaping status: Never Used  Substance and Sexual Activity   Alcohol use: Not Currently    Alcohol/week: 10.0 standard drinks of alcohol    Types: 10 Cans of beer per week    Comment: daily   Drug use: Yes    Types: Marijuana    Comment: occassional   Sexual activity: Not Currently    Birth control/protection: Post-menopausal  Other Topics Concern   Not on file  Social History Narrative   Pt lives in [place] with husband.  She is on disability.  Pt stated that she receives outpatient psychiatry services through [facility].  She does not receive outpatient therapy.   Social Determinants of Health   Financial Resource Strain: Low Risk  (10/23/2022)   Overall Financial Resource Strain (CARDIA)    Difficulty of Paying Living Expenses: Not hard at all  Food Insecurity: No Food Insecurity (10/23/2022)   Hunger Vital Sign    Worried About Running Out of Food in the Last Year: Never true    Ran Out of Food in the Last Year: Never true  Transportation Needs: No Transportation Needs (10/23/2022)   PRAPARE - Administrator, Civil Service (Medical): No    Lack of Transportation (Non-Medical): No  Physical Activity: Insufficiently Active (10/23/2022)   Exercise Vital Sign    Days of Exercise per Week: 2 days    Minutes of Exercise per Session: 10 min  Stress: No Stress Concern Present (10/23/2022)   [employer] of Occupational Health - Occupational Stress Questionnaire    Feeling of Stress : Not at all  Social Connections: Moderately Isolated (10/23/2022)    Social Connection and Isolation Panel [NHANES]    Frequency of Communication with Friends and Family: More than three times a week    Frequency of Social Gatherings with Friends and Family: More than three times a week    Attends Religious Services: Never    Database administrator or Organizations: No    Attends Banker Meetings: Never    Marital Status: Married     Review of Systems   Gen: Denies fever, chills, anorexia. Denies fatigue, weakness, weight loss.  CV: Denies chest pain, palpitations, syncope, peripheral edema, and claudication. Resp: Denies dyspnea at rest, cough, wheezing, coughing  up blood, and pleurisy. GI: See HPI Derm: Denies rash, itching, dry skin Psych: Denies depression, anxiety, memory loss, confusion. No homicidal or suicidal ideation.  Heme: Denies bruising, bleeding, and enlarged lymph nodes.  Physical Exam   There were no vitals taken for this visit.  General:   Alert and oriented. No distress noted. Pleasant and cooperative.  Head:  Normocephalic and atraumatic. Eyes:  Conjuctiva clear without scleral icterus. Mouth:  Oral mucosa pink and moist. Good dentition. No lesions. Lungs:  Clear to auscultation bilaterally. No wheezes, rales, or rhonchi. No distress.  Heart:  S1, S2 present without murmurs appreciated.  Abdomen:  +BS, soft, non-tender and non-distended. No rebound or guarding. No HSM or masses noted. Rectal: *** Msk:  Symmetrical without gross deformities. Normal posture. Extremities:  Without edema. Neurologic:  Alert and  oriented x4 Psych:  Alert and cooperative. Normal mood and affect.  Assessment  Dawn Kelley is a 62 y.o. female with a history of *** presenting today with    PLAN   ***     Brooke Bonito, MSN, FNP-BC, AGACNP-BC [facility]

## 2023-04-09 NOTE — Telephone Encounter (Signed)
FYI:  Patient aware she will see Brooke Bonito tomorrow 04/10/2023 at 9 am. She is aware of the date, time and location.

## 2023-04-09 NOTE — Telephone Encounter (Signed)
That will be good if patient wants to come today

## 2023-04-10 ENCOUNTER — Ambulatory Visit: Payer: 59 | Admitting: Gastroenterology

## 2023-04-22 NOTE — Telephone Encounter (Signed)
error 

## 2023-05-05 ENCOUNTER — Emergency Department (HOSPITAL_COMMUNITY)
Admission: EM | Admit: 2023-05-05 | Discharge: 2023-05-05 | Discharge status: Against Medical Advice | Payer: 59 | Source: Home / Self Care

## 2023-05-10 ENCOUNTER — Ambulatory Visit: Payer: Self-pay | Admitting: Family Medicine

## 2023-05-10 NOTE — Telephone Encounter (Signed)
Chief Complaint: continuing anxiety and depression Symptoms: unable to sleep, no energy Frequency: has been going on for 10 months but has increased over the last week Pertinent Negatives: Patient denies SI, HI Disposition: [] ED /[] Urgent Care (no appt availability in office) / [x] Appointment(In office/virtual)/ []  [place] [facility]/ [] Home Care/ [] Refused Recommended Disposition /[] [place] [facility]/ []  Follow-up with PCP Additional Notes: patient with appointment today to see psychiatrist c/o continued anxiety and depression. Patient states symptoms have been going on for over 10 months but increased over the last week. Patient endorses difficulty sleeping, no energy. Denies SI or HI. Patient instructed to keep her appointment with psychiatrist. Patient verbalizes understanding of plan. All questions answered.    Reason for Disposition  [1] Depression AND [2] worsening (e.g., sleeping poorly, less able to do activities of daily living)  Answer Assessment - Initial Assessment Questions 1. CONCERN: "What happened that made you call today?"     Increases in depression 2. DEPRESSION SYMPTOM SCREENING: "How are you feeling overall?" (e.g., decreased energy, increased sleeping or difficulty sleeping, difficulty concentrating, feelings of sadness, guilt, hopelessness, or worthlessness)     Decreased energy 3. RISK OF HARM - SUICIDAL IDEATION:  "Do you ever have thoughts of hurting or killing yourself?"  (e.g., yes, no, no but preoccupation with thoughts about death)   - INTENT:  "Do you have thoughts of hurting or killing yourself right NOW?" (e.g., yes, no, N/A)   - PLAN: "Do you have a specific plan for how you would do this?" (e.g., gun, knife, overdose, no plan, N/A)     No 4. RISK OF HARM - HOMICIDAL IDEATION:  "Do you ever have thoughts of hurting or killing someone else?"  (e.g., yes, no, no but preoccupation with thoughts about death)   - INTENT:  "Do you have thoughts of hurting  or killing someone right NOW?" (e.g., yes, no, N/A)   - PLAN: "Do you have a specific plan for how you would do this?" (e.g., gun, knife, no plan, N/A)      No 5. FUNCTIONAL IMPAIRMENT: "How have things been going for you overall? Have you had more difficulty than usual doing your normal daily activities?"  (e.g., better, same, worse; self-care, school, work, interactions)     worse 6. SUPPORT: "Who is with you now?" "Who do you live with?" "Do you have family or friends who you can talk to?"      husband 7. THERAPIST: "Do you have a counselor or therapist? Name?"     yes 8. STRESSORS: "Has there been any new stress or recent changes in your life?"     No treatment 9. ALCOHOL USE OR SUBSTANCE USE (DRUG USE): "Do you drink alcohol or use any illegal drugs?"     no 10. OTHER: "Do you have any other physical symptoms right now?" (e.g., fever)       NO  Answer Assessment - Initial Assessment Questions 1. CONCERN: "Did anything happen that prompted you to call today?"      Increases in anxiety 2. ANXIETY SYMPTOMS: "Can you describe how you (your loved one; patient) have been feeling?" (e.g., tense, restless, panicky, anxious, keyed up, overwhelmed, sense of impending doom).      Unable to complete daily activities 3. ONSET: "How long have you been feeling this way?" (e.g., hours, days, weeks)     Several weeks 4. SEVERITY: "How would you rate the level of anxiety?" (e.g., 0 - 10; or mild, moderate, severe).  severe 5. FUNCTIONAL IMPAIRMENT: "How have these feelings affected your ability to do daily activities?" "Have you had more difficulty than usual doing your normal daily activities?" (e.g., getting better, same, worse; self-care, school, work, interactions)     worse 6. HISTORY: "Have you felt this way before?" "Have you ever been diagnosed with an anxiety problem in the past?" (e.g., generalized anxiety disorder, panic attacks, PTSD). If Yes, ask: "How was this problem treated?" (e.g.,  medicines, counseling, etc.)     Been increasing over the last week.  7. RISK OF HARM - SUICIDAL IDEATION: "Do you ever have thoughts of hurting or killing yourself?" If Yes, ask:  "Do you have these feelings now?" "Do you have a plan on how you would do this?"     No 8. TREATMENT:  "What has been done so far to treat this anxiety?" (e.g., medicines, relaxation strategies). "What has helped?"     medications 9. TREATMENT - THERAPIST: "Do you have a counselor or therapist? Name?"     Yes 10. POTENTIAL TRIGGERS: "Do you drink caffeinated beverages (e.g., coffee, colas, teas), and how much daily?" "Do you drink alcohol or use any drugs?" "Have you started any new medicines recently?"       Patient unsure 11. PATIENT SUPPORT: "Who is with you now?" "Who do you live with?" "Do you have family or friends who you can talk to?"        husband 35. OTHER SYMPTOMS: "Do you have any other symptoms?" (e.g., feeling depressed, trouble concentrating, trouble sleeping, trouble breathing, palpitations or fast heartbeat, chest pain, sweating, nausea, or diarrhea)       Depressed, trouble sleeping  Protocols used: Anxiety and Panic Attack-A-AH, Depression-A-AH

## 2023-05-10 NOTE — Telephone Encounter (Signed)
When agent connected [place], pt hung up. RN called back twice and went to voicemail. Will route to call back basket for one more attempt.        Copied from CRM [id number]. Topic: Clinical - Red Word Triage >> May 10, 2023 11:01 AM Dawn Kelley wrote: Red Word that prompted transfer to Nurse Triage: Patient states she has had a terrible nervous breakdown & she doesn't know what to do.

## 2023-06-10 ENCOUNTER — Other Ambulatory Visit (INDEPENDENT_AMBULATORY_CARE_PROVIDER_SITE_OTHER): Payer: Self-pay | Admitting: Gastroenterology

## 2023-06-28 ENCOUNTER — Ambulatory Visit: Payer: Self-pay | Admitting: Family Medicine

## 2023-06-28 NOTE — Telephone Encounter (Signed)
Copied from CRM [id number]. Topic: Clinical - Red Word Triage >> Jun 28, 2023 11:58 AM Gildardo Pounds wrote: Red Word that prompted transfer to Nurse Triage: nervous breakdown, cannot function, having a hard time. patient is left at hospital for hours with no help. [phone]   Chief Complaint: Anxiety Symptoms: anxiety Frequency: x 9 months Pertinent Negatives: Patient denies S.I. Disposition: [] ED /[] Urgent Care (no appt availability in office) / [x] Appointment(In office/virtual)/ []  [facility] [facility]/ [] Home Care/ [] Refused Recommended Disposition /[] [place] [facility]/ []  Follow-up with PCP Additional Notes: Patient called and advised that she was having a nervous breakdown and needed help. Patient denies any S.I. Patient spoke to her psychiatrist yesterday virtually and they talked about adjusting her medications. Patient states that her mind and body are falling apart. Patient states her husband is there. Patient states she is scared to shower alone. Patient isn't eating. Patient feels safe where she is. When this RN offered to call an ambulance for the patient to be taken to the hospital to be taken care of, she stated that she didn't want to go to [facility], which is the closest hospital to her where the ambulance took her last time.  She said that she would like to go to [place] but the ambulance refused to take her there last time. Patient stated that she needed to hang up and she needed to charge her phone because it was running out of battery.  This RN asked what I could do to help her at this time.  Patient states that I can call her back in a few and that she needed to charge her phone.  Called patient back and spoke with her again about the situation. Patient states that she doesn't want her husband to drive her to the [facility] because they didn't keep her in the hospital last time.  She also did not want to go to [facility] hospital due to past experiences there.  She said  that she can't call her psychiatrist back because if she doesn't have an appointment with him, they won't let her speak to him.   Patient did allow this RN to set up an appointment for her Monday 07/01/2023 at her PCP office at 2:20 pm with the Doctor of the Day.  Patient stated that she would like to possibly be set up with a psychiatrist who was closer that she could actually see.  Patient was calm and cooperative.  She spoke about her pet dog and seemed a little better prior to hanging up with this RN.  Patient states that she appreciated having the appointment set up.  Reason for Disposition  MODERATE anxiety (e.g., persistent or frequent anxiety symptoms; interferes with sleep, school, or work)  Answer Assessment - Initial Assessment Questions 1. CONCERN: "Did anything happen that prompted you to call today?"      "Wake up every night at 2-3am and I feel like someone is going to murder me" 2. ANXIETY SYMPTOMS: "Can you describe how you (your loved one; patient) have been feeling?" (e.g., tense, restless, panicky, anxious, keyed up, overwhelmed, sense of impending doom).      ----------- 3. ONSET: "How long have you been feeling this way?" (e.g., hours, days, weeks)     9 months 4. SEVERITY: "How would you rate the level of anxiety?" (e.g., 0 - 10; or mild, moderate, severe).     10 5. FUNCTIONAL IMPAIRMENT: "How have these feelings affected your ability to do daily activities?" "Have you  had more difficulty than usual doing your normal daily activities?" (e.g., getting better, same, worse; self-care, school, work, interactions)     -------- 6. HISTORY: "Have you felt this way before?" "Have you ever been diagnosed with an anxiety problem in the past?" (e.g., generalized anxiety disorder, panic attacks, PTSD). If Yes, ask: "How was this problem treated?" (e.g., medicines, counseling, etc.)     9 months 7. RISK OF HARM - SUICIDAL IDEATION: "Do you ever have thoughts of hurting or killing  yourself?" If Yes, ask:  "Do you have these feelings now?" "Do you have a plan on how you would do this?"     no 8. TREATMENT:  "What has been done so far to treat this anxiety?" (e.g., medicines, relaxation strategies). "What has helped?"     --------------- 9. TREATMENT - THERAPIST: "Do you have a counselor or therapist? Name?"     Psychiatrist--talked about adjusting medications 10. POTENTIAL TRIGGERS: "Do you drink caffeinated beverages (e.g., coffee, colas, teas), and how much daily?" "Do you drink alcohol or use any drugs?" "Have you started any new medicines recently?"       unknown 11. PATIENT SUPPORT: "Who is with you now?" "Who do you live with?" "Do you have family or friends who you can talk to?"        husband 39. OTHER SYMPTOMS: "Do you have any other symptoms?" (e.g., feeling depressed, trouble concentrating, trouble sleeping, trouble breathing, palpitations or fast heartbeat, chest pain, sweating, nausea, or diarrhea)       Trouble sleeping, trouble eating  Protocols used: Anxiety and Panic Attack-A-AH

## 2023-07-01 ENCOUNTER — Ambulatory Visit: Payer: 59 | Admitting: Nurse Practitioner

## 2023-07-04 ENCOUNTER — Encounter: Payer: Self-pay | Admitting: Family Medicine

## 2023-07-04 ENCOUNTER — Ambulatory Visit: Payer: Self-pay | Admitting: Family Medicine
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
## 2023-07-05 ENCOUNTER — Telehealth: Payer: 59 | Admitting: Family Medicine

## 2023-07-05 ENCOUNTER — Encounter: Payer: Self-pay | Admitting: Family Medicine

## 2023-07-05 DIAGNOSIS — F1320 Sedative, hypnotic or anxiolytic dependence, uncomplicated: Secondary | ICD-10-CM | POA: Diagnosis not present

## 2023-07-05 DIAGNOSIS — F5104 Psychophysiologic insomnia: Secondary | ICD-10-CM

## 2023-07-05 DIAGNOSIS — F339 Major depressive disorder, recurrent, unspecified: Secondary | ICD-10-CM

## 2023-07-05 DIAGNOSIS — F314 Bipolar disorder, current episode depressed, severe, without psychotic features: Secondary | ICD-10-CM | POA: Diagnosis not present

## 2023-07-05 DIAGNOSIS — F411 Generalized anxiety disorder: Secondary | ICD-10-CM

## 2023-07-05 NOTE — Patient Instructions (Signed)
 [facility]  Phone:  [phone]  Address:  [address]  [place], [place] [address]  Hours:  Open 24/7, No appointment required.    The [facility] ([facility] is centrally located in the [place] at [address], [place]

## 2023-07-05 NOTE — Progress Notes (Signed)
Virtual Visit via Video Note  I connected with Dawn Kelley on 07/05/23 at  3:50 PM EST by a video enabled telemedicine application and verified that I am speaking with the correct person using two identifiers.  Patient Location: Home Provider Location: Office/Clinic  I discussed the limitations, risks, security, and privacy concerns of performing an evaluation and management service by video and the availability of in person appointments. I also discussed with the patient that there may be a patient responsible charge related to this service. The patient expressed understanding and agreed to proceed.  Subjective: PCP: Arrie Senate, FNP  Chief Complaint  Patient presents with   Panic Attack   HPI Patient presents today via telehealth with husband to discuss anxiety. Patient has longstanding history of anxiety, panic attacks, depression, PTSD. She is currently a patient of [facility], from whom she receives clonazepam. She has been referred to multiple other psychiatrists in the past but has not been able to find a good fit due to insurance coverage or patient preference. States that she "had a nervous breakdown months ago and doctors have not been able to help me with it." States that none of her medications are helping. Denies any thoughts of self harm. She is taking buspar, clonazepam, mirtazapine, naltrexone, olanzapine, propranolol, quetiapine, vilazodone, topiramate. States that she went to [facility] before and it was a "frightful experience."   ROS: Per HPI  Current Outpatient Medications:    busPIRone (BUSPAR) 30 MG tablet, Take 30 mg by mouth. One at bedtime, Disp: , Rfl:    clonazePAM (KLONOPIN) 1 MG tablet, Take 1 mg by mouth 2 (two) times daily as needed for anxiety., Disp: , Rfl:    diphenoxylate-atropine (LOMOTIL) 2.5-0.025 MG tablet, Take 1 tablet by mouth 4 (four) times daily as needed for diarrhea or loose stools (diarrhea). Prn., Disp: 120 tablet,  Rfl: 1   hydrOXYzine (ATARAX) 25 MG tablet, Take 1 tablet (25 mg total) by mouth every 8 (eight) hours as needed. May make drowsy, no driving when taking, Disp: 15 tablet, Rfl: 0   levothyroxine (SYNTHROID) 25 MCG tablet, Take 25 mcg by mouth daily before breakfast., Disp: , Rfl:    losartan (COZAAR) 25 MG tablet, Take 25 mg by mouth daily., Disp: , Rfl:    mirtazapine (REMERON) 45 MG tablet, Take 45 mg by mouth at bedtime., Disp: , Rfl:    naltrexone (DEPADE) 50 MG tablet, Take 50 mg by mouth daily., Disp: , Rfl:    OLANZapine (ZYPREXA) 15 MG tablet, Take 15 mg by mouth at bedtime., Disp: , Rfl:    ondansetron (ZOFRAN) 4 MG tablet, Take 1 tablet (4 mg total) by mouth every 8 (eight) hours as needed for nausea or vomiting., Disp: 90 tablet, Rfl: 0   pantoprazole (PROTONIX) 40 MG tablet, take 1 tablet once daily., Disp: 90 tablet, Rfl: 0   propranolol (INDERAL) 20 MG tablet, Take 20 mg by mouth 3 (three) times daily., Disp: , Rfl:    QUEtiapine (SEROQUEL) 25 MG tablet, Take 25 mg by mouth., Disp: , Rfl:    rosuvastatin (CRESTOR) 20 MG tablet, Take 20 mg by mouth daily., Disp: , Rfl:    sucralfate (CARAFATE) 1 GM/10ML suspension, Take 10 mLs (1 g total) by mouth 4 (four) times daily., Disp: 414 mL, Rfl: 1   topiramate (TOPAMAX) 25 MG tablet, Take 25 mg by mouth 2 (two) times daily., Disp: , Rfl:    Vilazodone HCl (VIIBRYD) 40 MG TABS, Take 40 mg  by mouth daily., Disp: , Rfl:   Observations/Objective: There were no vitals filed for this visit. Physical Exam Constitutional:      General: She is awake. She is not in acute distress.    Appearance: Normal appearance. She is well-developed and well-groomed. She is not ill-appearing, toxic-appearing or diaphoretic.  Pulmonary:     Effort: Pulmonary effort is normal.  Neurological:     General: No focal deficit present.     Mental Status: She is alert and oriented to person, place, and time.  Psychiatric:        Attention and Perception: Attention  and perception normal.        Mood and Affect: Mood is anxious and depressed. Affect is tearful and inappropriate.        Speech: Speech is rapid and pressured.        Behavior: Behavior is hyperactive. Behavior is cooperative.        Thought Content: Thought content normal.        Cognition and Memory: Cognition and memory normal.        Judgment: Judgment normal.   Assessment and Plan: 1. Generalized anxiety disorder (Primary) Referral placed to psychiatry as below. Denies SI. Safety contract established. Provided patient and husband with information for [facility]. Discussed again with patient she needs to stay within specialty. Given the complexity of her care and medications, will not take over care of psychiatric conditions.  - Ambulatory referral to Psychiatry  2. Depression, recurrent (HCC) As above.  - Ambulatory referral to Psychiatry  3. Bipolar 1 disorder, depressed, severe (HCC) As above.  - Ambulatory referral to Psychiatry  4. Psychophysiologic insomnia As above.  - Ambulatory referral to Psychiatry  5. Benzodiazepine dependence (HCC) As above.  - Ambulatory referral to Psychiatry   Appt time 07/05/2023 03:50 PM Call duration: 00:11:57  Follow Up Instructions: Return in about 3 months (around 10/02/2023) for Chronic Condition Follow up.   I discussed the assessment and treatment plan with the patient. The patient was provided an opportunity to ask questions, and all were answered. The patient agreed with the plan and demonstrated an understanding of the instructions.   The patient was advised to call back or seek an in-person evaluation if the symptoms worsen or if the condition fails to improve as anticipated.  The above assessment and management plan was discussed with the patient. The patient verbalized understanding of and has agreed to the management plan.   Neale Burly, DNP-FNP [facility] [address] [place], [place] [address] [phone]

## 2023-07-17 ENCOUNTER — Emergency Department (HOSPITAL_COMMUNITY)
Admission: EM | Admit: 2023-07-17 | Discharge: 2023-07-17 | Disposition: A | Discharge status: Home / Self Care | Attending: Emergency Medicine | Admitting: Emergency Medicine

## 2023-07-17 ENCOUNTER — Encounter (HOSPITAL_COMMUNITY): Payer: Self-pay

## 2023-07-17 ENCOUNTER — Other Ambulatory Visit: Payer: Self-pay

## 2023-07-17 DIAGNOSIS — Z743 Need for continuous supervision: Secondary | ICD-10-CM | POA: Diagnosis not present

## 2023-07-17 DIAGNOSIS — Z79899 Other long term (current) drug therapy: Secondary | ICD-10-CM | POA: Diagnosis not present

## 2023-07-17 DIAGNOSIS — R6889 Other general symptoms and signs: Secondary | ICD-10-CM | POA: Diagnosis not present

## 2023-07-17 DIAGNOSIS — R7989 Other specified abnormal findings of blood chemistry: Secondary | ICD-10-CM | POA: Diagnosis not present

## 2023-07-17 DIAGNOSIS — F411 Generalized anxiety disorder: Secondary | ICD-10-CM | POA: Diagnosis not present

## 2023-07-17 DIAGNOSIS — F419 Anxiety disorder, unspecified: Secondary | ICD-10-CM | POA: Diagnosis present

## 2023-07-17 DIAGNOSIS — G4489 Other headache syndrome: Secondary | ICD-10-CM | POA: Diagnosis not present

## 2023-07-17 LAB — COMPREHENSIVE METABOLIC PANEL
ALT: 116 U/L — ABNORMAL HIGH (ref 0–44)
AST: 161 U/L — ABNORMAL HIGH (ref 15–41)
Albumin: 3.2 g/dL — ABNORMAL LOW (ref 3.5–5.0)
Alkaline Phosphatase: 122 U/L (ref 38–126)
Anion gap: 12 (ref 5–15)
BUN: <5 mg/dL — ABNORMAL LOW (ref 8–23)
CO2: 20 mmol/L — ABNORMAL LOW (ref 22–32)
Calcium: 8.9 mg/dL (ref 8.9–10.3)
Chloride: 106 mmol/L (ref 98–111)
Creatinine, Ser: 0.49 mg/dL (ref 0.44–1.00)
GFR, Estimated: >60 mL/min (ref >60)
Glucose, Bld: 127 mg/dL — ABNORMAL HIGH (ref 70–99)
Potassium: 3.6 mmol/L (ref 3.5–5.1)
Sodium: 138 mmol/L (ref 135–145)
Total Bilirubin: 0.4 mg/dL (ref 0.0–1.2)
Total Protein: 7.3 g/dL (ref 6.5–8.1)

## 2023-07-17 LAB — CBC WITH DIFFERENTIAL/PLATELET
Abs Immature Granulocytes: 0.06 10*3/uL (ref 0.00–0.07)
Basophils Absolute: 0.1 10*3/uL (ref 0.0–0.1)
Basophils Relative: 1 %
Eosinophils Absolute: 0.1 10*3/uL (ref 0.0–0.5)
Eosinophils Relative: 1 %
HCT: 44.4 % (ref 36.0–46.0)
Hemoglobin: 15.1 g/dL — ABNORMAL HIGH (ref 12.0–15.0)
Immature Granulocytes: 1 %
Lymphocytes Relative: 18 %
Lymphs Abs: 1.9 10*3/uL (ref 0.7–4.0)
MCH: 32.2 pg (ref 26.0–34.0)
MCHC: 34 g/dL (ref 30.0–36.0)
MCV: 94.7 fL (ref 80.0–100.0)
Monocytes Absolute: 0.5 10*3/uL (ref 0.1–1.0)
Monocytes Relative: 4 %
Neutro Abs: 8.2 10*3/uL — ABNORMAL HIGH (ref 1.7–7.7)
Neutrophils Relative %: 75 %
Platelets: 390 10*3/uL (ref 150–400)
RBC: 4.69 MIL/uL (ref 3.87–5.11)
RDW: 13.8 % (ref 11.5–15.5)
WBC: 10.7 10*3/uL — ABNORMAL HIGH (ref 4.0–10.5)
nRBC: 0 % (ref 0.0–0.2)

## 2023-07-17 MED ORDER — LORAZEPAM 1 MG PO TABS
2.0000 mg | ORAL_TABLET | Freq: Once | ORAL | Status: AC
  Administered 2023-07-17: 2 mg via ORAL
  Filled 2023-07-17: qty 2

## 2023-07-17 NOTE — ED Notes (Signed)
 Pt came in via EMS and was taken to room 11. Pt states she needs another room due to the room not being "open enough"; pt informed this is the only room we have available right now or triage; pt verbalized she would like to go to triage; ems took pt to triage

## 2023-07-17 NOTE — Discharge Instructions (Addendum)
 Your liver function tests are mildly abnormal today.  Avoid Tylenol and alcohol.  Talk to your doctor and/your psychiatrist about your medications as these may need to be adjusted as well. You will need to get your liver tests rechecked in less than a week  If you develop new or worsening or uncontrolled anxiety, abdominal pain, or any other new/concerning symptoms then return to the ER or call 911.

## 2023-07-17 NOTE — ED Triage Notes (Signed)
 Pt BIB ems for anxiety with a headache for over 3 months. Pt has been referred to a specialist but will not go to the dr just see them over the internet. Per pt the dr has taken her medications from her that she has been on since she was 63 years old but unsure of the name of the medication. Pt refuses to go to a treatment wants to be seen in [place] because she feels claustrophobic.  Pt states use to take Klonopin but is not taking it because "it does not help."

## 2023-07-17 NOTE — ED Provider Notes (Signed)
 [place] EMERGENCY DEPARTMENT AT [facility] Provider Note   CSN: [id number] Arrival date & time: 07/17/23  1308     History  Chief Complaint  Patient presents with   Anxiety    Dawn Kelley is a 63 y.o. female.  HPI 63 year old female with a history of bipolar disorder, OCD, PTSD, depression, anxiety presents with worsening anxiety.  She and her husband provide the history.  Symptoms have been worsening for months.  Talking to her, it is unclear what exactly made her call 911 today except for worsening symptoms and feeling like she needs something else.  She denies SI or HI.  She has been compliant with her meds though states her doctor took her off her olanzapine a few days ago.  She states she feels like she needs something stronger than Klonopin because it is not helping.  She is due to see her psychiatrist in 2 days but feels like she cannot wait that long.  She has been feeling like she has a dry mouth and has chronic diarrhea.  She has waxing and waning mild headaches but nothing is getting worse.  No chest pain or shortness of breath.  Home Medications Prior to Admission medications   Medication Sig Start Date End Date Taking? Authorizing Provider  busPIRone (BUSPAR) 30 MG tablet Take 30 mg by mouth. One at bedtime    [provider]  clonazePAM (KLONOPIN) 1 MG tablet Take 1 mg by mouth 2 (two) times daily as needed for anxiety.    [provider]  diphenoxylate-atropine (LOMOTIL) 2.5-0.025 MG tablet Take 1 tablet by mouth 4 (four) times daily as needed for diarrhea or loose stools (diarrhea). Prn. 11/14/22   Carlan, Jeral Pinch, NP  hydrOXYzine (ATARAX) 25 MG tablet Take 1 tablet (25 mg total) by mouth every 8 (eight) hours as needed. May make drowsy, no driving when taking 10/16/76   Cathren Laine, MD  levothyroxine (SYNTHROID) 25 MCG tablet Take 25 mcg by mouth daily before breakfast.    [provider]  losartan (COZAAR) 25 MG tablet Take 25  mg by mouth daily.    [provider]  mirtazapine (REMERON) 45 MG tablet Take 45 mg by mouth at bedtime.    [provider]  naltrexone (DEPADE) 50 MG tablet Take 50 mg by mouth daily.    [provider]  OLANZapine (ZYPREXA) 15 MG tablet Take 15 mg by mouth at bedtime.    [provider]  ondansetron (ZOFRAN) 4 MG tablet Take 1 tablet (4 mg total) by mouth every 8 (eight) hours as needed for nausea or vomiting. 08/28/22   Milian, Aleen Campi, FNP  pantoprazole (PROTONIX) 40 MG tablet take 1 tablet once daily. 06/10/23   Carlan, Chelsea L, NP  propranolol (INDERAL) 20 MG tablet Take 20 mg by mouth 3 (three) times daily. 03/29/20   [provider]  QUEtiapine (SEROQUEL) 25 MG tablet Take 25 mg by mouth. 10/15/22   [provider]  rosuvastatin (CRESTOR) 20 MG tablet Take 20 mg by mouth daily. 10/15/22   [provider]  sucralfate (CARAFATE) 1 GM/10ML suspension Take 10 mLs (1 g total) by mouth 4 (four) times daily. 09/10/22   Carlan, Chelsea L, NP  topiramate (TOPAMAX) 25 MG tablet Take 25 mg by mouth 2 (two) times daily. 09/10/22   [provider]  Vilazodone HCl (VIIBRYD) 40 MG TABS Take 40 mg by mouth daily.    [provider]  Allergies    Escitalopram, Seroquel [quetiapine fumarate], and Trazodone    Review of Systems   Review of Systems  Constitutional:  Negative for fever.  Respiratory:  Negative for cough and shortness of breath.   Cardiovascular:  Negative for chest pain.  Gastrointestinal:  Negative for vomiting.  Psychiatric/Behavioral:  Negative for suicidal ideas. The patient is nervous/anxious.     Physical Exam Updated Vital Signs BP (!) 146/88 (BP Location: Right Arm)   Pulse 79   Temp 97.9 F (36.6 C) (Oral)   Resp 18   Ht 5\' 6"  (1.676 m)   Wt 91.6 kg   SpO2 92%   BMI 32.59 kg/m  Physical Exam Vitals and nursing note reviewed.  Constitutional:      General: She is not in acute  distress.    Appearance: She is well-developed. She is not diaphoretic.  HENT:     Head: Normocephalic and atraumatic.  Eyes:     Extraocular Movements: Extraocular movements intact.     Pupils: Pupils are equal, round, and reactive to light.  Cardiovascular:     Rate and Rhythm: Normal rate and regular rhythm.     Heart sounds: Normal heart sounds.  Pulmonary:     Effort: Pulmonary effort is normal.     Breath sounds: Normal breath sounds.  Abdominal:     Palpations: Abdomen is soft.     Tenderness: There is no abdominal tenderness.  Skin:    General: Skin is warm and dry.  Neurological:     Mental Status: She is alert.     Comments: CN 3-12 grossly intact. 5/5 strength in all 4 extremities. Grossly normal sensation.  Psychiatric:        Mood and Affect: Mood is anxious.        Thought Content: Thought content does not include homicidal or suicidal ideation.     ED Results / Procedures / Treatments   Labs (all labs ordered are listed, but only abnormal results are displayed) Labs Reviewed  COMPREHENSIVE METABOLIC PANEL - Abnormal; Notable for the following components:      Result Value   CO2 20 (*)    Glucose, Bld 127 (*)    BUN <5 (*)    Albumin 3.2 (*)    AST 161 (*)    ALT 116 (*)    All other components within normal limits  CBC WITH DIFFERENTIAL/PLATELET - Abnormal; Notable for the following components:   WBC 10.7 (*)    Hemoglobin 15.1 (*)    Neutro Abs 8.2 (*)    All other components within normal limits    EKG None  Radiology No results found.  Procedures Procedures    Medications Ordered in ED Medications  LORazepam (ATIVAN) tablet 2 mg (2 mg Oral Given 07/17/23 0740)    ED Course/ Medical Decision Making/ A&P                                 Medical Decision Making Amount and/or Complexity of Data Reviewed External Data Reviewed: notes. Labs: ordered.    Details: The elevated AST and ALT.  Risk Prescription drug management.   Patient  presents with chronic anxiety.  She is asking me to prescribe her something different than her Klonopin and I discussed she needs to follow-up with her psychiatrist to discuss a change in benzodiazepine or anxiety medication.  She has psychiatry follow-up in 2 days already.  She  otherwise feels a little better with some lorazepam here as a one-time dose.  She complains of a mild waxing and waning headache for very long time but has no neurosymptoms, vomiting, and I think acute CNS emergency is unlikely.  She is found to have mildly abnormal LFTs with no right upper quadrant tenderness.  Looking at her list, olanzapine could contribute to abnormal LFTs but it looks like her olanzapine was already stopped.   Will have her follow-up with her PCP for a recheck of her labs and will recommend the [facility] as an outpatient resource in addition to her psychiatrist.  Otherwise she is feeling better enough to be discharged.  No SI/HI and I do not think she needs an emergent psychiatric admission or commitment.        Final Clinical Impression(s) / ED Diagnoses Final diagnoses:  Generalized anxiety disorder  Abnormal LFTs    Rx / DC Orders ED Discharge Orders     None         Pricilla Loveless, MD 07/17/23 [phone]

## 2023-07-19 DIAGNOSIS — Z79899 Other long term (current) drug therapy: Secondary | ICD-10-CM | POA: Diagnosis not present

## 2023-07-19 DIAGNOSIS — R404 Transient alteration of awareness: Secondary | ICD-10-CM | POA: Diagnosis not present

## 2023-07-19 DIAGNOSIS — R112 Nausea with vomiting, unspecified: Secondary | ICD-10-CM | POA: Diagnosis not present

## 2023-07-19 DIAGNOSIS — Z743 Need for continuous supervision: Secondary | ICD-10-CM | POA: Diagnosis not present

## 2023-07-19 DIAGNOSIS — I10 Essential (primary) hypertension: Secondary | ICD-10-CM | POA: Diagnosis not present

## 2023-07-19 DIAGNOSIS — Z91040 Latex allergy status: Secondary | ICD-10-CM | POA: Diagnosis not present

## 2023-07-19 DIAGNOSIS — R197 Diarrhea, unspecified: Secondary | ICD-10-CM | POA: Diagnosis not present

## 2023-07-19 DIAGNOSIS — E785 Hyperlipidemia, unspecified: Secondary | ICD-10-CM | POA: Diagnosis not present

## 2023-07-22 ENCOUNTER — Emergency Department (HOSPITAL_COMMUNITY)
Admission: EM | Admit: 2023-07-22 | Discharge: 2023-07-23 | Disposition: A | Discharge status: Home / Self Care | Attending: Emergency Medicine | Admitting: Emergency Medicine

## 2023-07-22 ENCOUNTER — Other Ambulatory Visit: Payer: Self-pay

## 2023-07-22 ENCOUNTER — Emergency Department (HOSPITAL_COMMUNITY)

## 2023-07-22 ENCOUNTER — Encounter (HOSPITAL_COMMUNITY): Payer: Self-pay

## 2023-07-22 DIAGNOSIS — R42 Dizziness and giddiness: Secondary | ICD-10-CM | POA: Insufficient documentation

## 2023-07-22 DIAGNOSIS — M47816 Spondylosis without myelopathy or radiculopathy, lumbar region: Secondary | ICD-10-CM | POA: Diagnosis not present

## 2023-07-22 DIAGNOSIS — F419 Anxiety disorder, unspecified: Secondary | ICD-10-CM | POA: Insufficient documentation

## 2023-07-22 DIAGNOSIS — R197 Diarrhea, unspecified: Secondary | ICD-10-CM | POA: Diagnosis not present

## 2023-07-22 DIAGNOSIS — Z743 Need for continuous supervision: Secondary | ICD-10-CM | POA: Diagnosis not present

## 2023-07-22 DIAGNOSIS — M545 Low back pain, unspecified: Secondary | ICD-10-CM | POA: Diagnosis not present

## 2023-07-22 DIAGNOSIS — F411 Generalized anxiety disorder: Secondary | ICD-10-CM | POA: Diagnosis present

## 2023-07-22 DIAGNOSIS — G834 Cauda equina syndrome: Secondary | ICD-10-CM | POA: Diagnosis not present

## 2023-07-22 DIAGNOSIS — Z79899 Other long term (current) drug therapy: Secondary | ICD-10-CM | POA: Insufficient documentation

## 2023-07-22 DIAGNOSIS — M5137 Other intervertebral disc degeneration, lumbosacral region with discogenic back pain only: Secondary | ICD-10-CM | POA: Diagnosis not present

## 2023-07-22 DIAGNOSIS — R450 Nervousness: Secondary | ICD-10-CM | POA: Diagnosis not present

## 2023-07-22 DIAGNOSIS — R350 Frequency of micturition: Secondary | ICD-10-CM | POA: Diagnosis not present

## 2023-07-22 DIAGNOSIS — I10 Essential (primary) hypertension: Secondary | ICD-10-CM | POA: Diagnosis not present

## 2023-07-22 DIAGNOSIS — M48061 Spinal stenosis, lumbar region without neurogenic claudication: Secondary | ICD-10-CM | POA: Diagnosis not present

## 2023-07-22 LAB — COMPREHENSIVE METABOLIC PANEL
ALT: 119 U/L — ABNORMAL HIGH (ref 0–44)
AST: 174 U/L — ABNORMAL HIGH (ref 15–41)
Albumin: 3.4 g/dL — ABNORMAL LOW (ref 3.5–5.0)
Alkaline Phosphatase: 123 U/L (ref 38–126)
Anion gap: 14 (ref 5–15)
BUN: 6 mg/dL — ABNORMAL LOW (ref 8–23)
CO2: 21 mmol/L — ABNORMAL LOW (ref 22–32)
Calcium: 8.9 mg/dL (ref 8.9–10.3)
Chloride: 101 mmol/L (ref 98–111)
Creatinine, Ser: 0.57 mg/dL (ref 0.44–1.00)
GFR, Estimated: >60 mL/min (ref >60)
Glucose, Bld: 144 mg/dL — ABNORMAL HIGH (ref 70–99)
Potassium: 3.5 mmol/L (ref 3.5–5.1)
Sodium: 136 mmol/L (ref 135–145)
Total Bilirubin: 0.7 mg/dL (ref 0.0–1.2)
Total Protein: 7.4 g/dL (ref 6.5–8.1)

## 2023-07-22 LAB — CBC WITH DIFFERENTIAL/PLATELET
Abs Immature Granulocytes: 0.07 10*3/uL (ref 0.00–0.07)
Basophils Absolute: 0.1 10*3/uL (ref 0.0–0.1)
Basophils Relative: 1 %
Eosinophils Absolute: 0.1 10*3/uL (ref 0.0–0.5)
Eosinophils Relative: 1 %
HCT: 43.4 % (ref 36.0–46.0)
Hemoglobin: 14.8 g/dL (ref 12.0–15.0)
Immature Granulocytes: 1 %
Lymphocytes Relative: 16 %
Lymphs Abs: 2.1 10*3/uL (ref 0.7–4.0)
MCH: 31.8 pg (ref 26.0–34.0)
MCHC: 34.1 g/dL (ref 30.0–36.0)
MCV: 93.1 fL (ref 80.0–100.0)
Monocytes Absolute: 0.7 10*3/uL (ref 0.1–1.0)
Monocytes Relative: 5 %
Neutro Abs: 10.4 10*3/uL — ABNORMAL HIGH (ref 1.7–7.7)
Neutrophils Relative %: 76 %
Platelets: 361 10*3/uL (ref 150–400)
RBC: 4.66 MIL/uL (ref 3.87–5.11)
RDW: 13.7 % (ref 11.5–15.5)
WBC: 13.5 10*3/uL — ABNORMAL HIGH (ref 4.0–10.5)
nRBC: 0 % (ref 0.0–0.2)

## 2023-07-22 LAB — URINALYSIS, ROUTINE W REFLEX MICROSCOPIC
Bilirubin Urine: NEGATIVE
Glucose, UA: NEGATIVE mg/dL
Hgb urine dipstick: NEGATIVE
Ketones, ur: NEGATIVE mg/dL
Nitrite: NEGATIVE
Protein, ur: NEGATIVE mg/dL
Specific Gravity, Urine: 1.004 — ABNORMAL LOW (ref 1.005–1.030)
pH: 7 (ref 5.0–8.0)

## 2023-07-22 LAB — RAPID URINE DRUG SCREEN, HOSP PERFORMED
Amphetamines: NOT DETECTED
Barbiturates: NOT DETECTED
Benzodiazepines: NOT DETECTED
Cocaine: NOT DETECTED
Opiates: NOT DETECTED
Tetrahydrocannabinol: POSITIVE — AB

## 2023-07-22 MED ORDER — CLONAZEPAM 0.5 MG PO TABS
1.0000 mg | ORAL_TABLET | Freq: Two times a day (BID) | ORAL | Status: DC
Start: 2023-07-22 — End: 2023-07-23
  Administered 2023-07-22: 1 mg via ORAL
  Filled 2023-07-22: qty 2

## 2023-07-22 MED ORDER — HYDROXYZINE HCL 50 MG/ML IM SOLN
50.0000 mg | INTRAMUSCULAR | Status: DC | PRN
  Administered 2023-07-23: 50 mg via INTRAMUSCULAR
  Filled 2023-07-22: qty 1

## 2023-07-22 MED ORDER — NALTREXONE HCL 50 MG PO TABS
50.0000 mg | ORAL_TABLET | Freq: Every day | ORAL | Status: DC
  Filled 2023-07-22 (×2): qty 1

## 2023-07-22 MED ORDER — LORAZEPAM 2 MG/ML IJ SOLN: 2.0000 mg | INTRAMUSCULAR | Status: DC | PRN

## 2023-07-22 MED ORDER — PROPRANOLOL HCL 10 MG PO TABS
20.0000 mg | ORAL_TABLET | Freq: Three times a day (TID) | ORAL | Status: DC
  Administered 2023-07-22: 20 mg via ORAL
  Filled 2023-07-22: qty 2

## 2023-07-22 MED ORDER — MIRTAZAPINE 15 MG PO TABS
45.0000 mg | ORAL_TABLET | Freq: Every day | ORAL | Status: DC
  Administered 2023-07-22: 45 mg via ORAL
  Filled 2023-07-22: qty 3

## 2023-07-22 MED ORDER — ROSUVASTATIN CALCIUM 20 MG PO TABS: 20.0000 mg | ORAL_TABLET | Freq: Every day | ORAL | Status: DC

## 2023-07-22 MED ORDER — LORAZEPAM 2 MG/ML IJ SOLN: 2.0000 mg | Freq: Once | INTRAMUSCULAR | Status: DC | PRN

## 2023-07-22 MED ORDER — QUETIAPINE FUMARATE 25 MG PO TABS
50.0000 mg | ORAL_TABLET | Freq: Every day | ORAL | Status: DC
Start: 2023-07-22 — End: 2023-07-23
  Administered 2023-07-22: 50 mg via ORAL
  Filled 2023-07-22: qty 2

## 2023-07-22 MED ORDER — HYDROXYZINE HCL 50 MG/ML IM SOLN
50.0000 mg | Freq: Once | INTRAMUSCULAR | Status: AC
  Administered 2023-07-22: 50 mg via INTRAMUSCULAR
  Filled 2023-07-22 (×2): qty 1

## 2023-07-22 MED ORDER — BUSPIRONE HCL 5 MG PO TABS
10.0000 mg | ORAL_TABLET | Freq: Three times a day (TID) | ORAL | Status: DC
  Administered 2023-07-22: 10 mg via ORAL
  Filled 2023-07-22: qty 2

## 2023-07-22 MED ORDER — TOPIRAMATE 25 MG PO TABS
25.0000 mg | ORAL_TABLET | Freq: Two times a day (BID) | ORAL | Status: DC
  Administered 2023-07-22: 25 mg via ORAL
  Filled 2023-07-22: qty 1

## 2023-07-22 MED ORDER — LORAZEPAM 1 MG PO TABS
2.0000 mg | ORAL_TABLET | Freq: Once | ORAL | Status: AC
  Administered 2023-07-22: 2 mg via ORAL
  Filled 2023-07-22: qty 2

## 2023-07-22 NOTE — ED Notes (Signed)
 Patient is very anxious, asking for medication to calm down, and has urination urgency, but unable to urinate for a sample.

## 2023-07-22 NOTE — ED Provider Notes (Signed)
 [place] EMERGENCY DE[place]MENT AT [facility] Provider Note   CSN: [id number] Arrival date & time: 07/22/23  1304     History  Chief Complaint  Patient presents with   Panic Attack    Dawn Kelley is a 63 y.o. female with history of anxiety, bipolar 1, depression, OCD, PTSD presents with complaints of persistent and worsening anxiety.  Patient states she feels dizzy and like she cannot breathe.  Dizziness is described as room spinning and constant, worse with movement.  Denies any headache.  No chest pain.  Patient has been seen at multiple facilities for similar complaints.  She states she has had some persistent diarrhea over the past few days.  No abdominal pain or vomiting reported.  She additionally reports that she has urinated on herself a number of times.  She does endorse some lower back pain that has been ongoing for the past month.  No injury or trauma.  Denies any lower extremity numbness or tingling.   HPI    Past Medical History:  Diagnosis Date   Anxiety    Bipolar 1 disorder (HCC)    Depression    OCD (obsessive compulsive disorder)    PTSD (post-traumatic stress disorder)    S/P ECT (electroconvulsive therapy)      Home Medications Prior to Admission medications   Medication Sig Start Date End Date Taking? Authorizing Provider  busPIRone (BUSPAR) 10 MG tablet Take 10 mg by mouth 3 (three) times daily. 07/03/23  Yes [provider]  clonazePAM (KLONOPIN) 1 MG tablet Take 1 mg by mouth 2 (two) times daily as needed for anxiety.   Yes [provider]  hydrOXYzine (ATARAX) 25 MG tablet Take 1 tablet (25 mg total) by mouth every 8 (eight) hours as needed. May make drowsy, no driving when taking 09/13/82  Yes Cathren Laine, MD  ibuprofen (ADVIL) 600 MG tablet Take 600 mg by mouth every 6 (six) hours as needed for mild pain (pain score 1-3) or moderate pain (pain score 4-6).   Yes [provider]  levothyroxine (SYNTHROID) 25 MCG  tablet Take 25 mcg by mouth daily before breakfast.   Yes [provider]  losartan (COZAAR) 25 MG tablet Take 25 mg by mouth daily.   Yes [provider]  mirtazapine (REMERON) 45 MG tablet Take 45 mg by mouth at bedtime.   Yes [provider]  naltrexone (DEPADE) 50 MG tablet Take 50 mg by mouth daily.   Yes [provider]  ondansetron (ZOFRAN) 4 MG tablet Take 1 tablet (4 mg total) by mouth every 8 (eight) hours as needed for nausea or vomiting. 08/28/22  Yes Milian, Aleen Campi, FNP  pantoprazole (PROTONIX) 40 MG tablet take 1 tablet once daily. 06/10/23  Yes Carlan, Chelsea L, NP  propranolol (INDERAL) 20 MG tablet Take 20 mg by mouth 3 (three) times daily. 03/29/20  Yes [provider]  QUEtiapine (SEROQUEL) 50 MG tablet Take 50 mg by mouth at bedtime. 07/03/23  Yes [provider]  rosuvastatin (CRESTOR) 20 MG tablet Take 20 mg by mouth daily. 10/15/22  Yes [provider]  topiramate (TOPAMAX) 25 MG tablet Take 25 mg by mouth 2 (two) times daily. 09/10/22  Yes [provider]  Vilazodone HCl (VIIBRYD) 40 MG TABS Take 40 mg by mouth daily.   Yes [provider]  busPIRone (BUSPAR) 30 MG tablet Take 30 mg by mouth. One at bedtime Patient not taking: Reported on 07/22/2023    [provider]      Allergies    Escitalopram, Seroquel [quetiapine fumarate], and Trazodone    Review of Systems   Review of Systems  Psychiatric/Behavioral:  The patient is nervous/anxious.     Physical Exam Updated Vital Signs BP (!) 124/111   Pulse 78   Temp 97.7 F (36.5 C) (Oral)   Resp 18   Ht 5\' 6"  (1.676 m)   Wt 91.6 kg   SpO2 97%   BMI 32.59 kg/m  Physical Exam Vitals and nursing note reviewed.  Constitutional:      Appearance: She is well-developed. She is not toxic-appearing.  HENT:     Head: Normocephalic and atraumatic.  Eyes:     Conjunctiva/sclera: Conjunctivae normal.  Cardiovascular:      Rate and Rhythm: Normal rate and regular rhythm.     Heart sounds: No murmur heard. Pulmonary:     Effort: Pulmonary effort is normal. No respiratory distress.     Breath sounds: Normal breath sounds.  Abdominal:     Palpations: Abdomen is soft.     Tenderness: There is no abdominal tenderness.  Musculoskeletal:        General: No swelling.     Cervical back: Neck supple.  Skin:    General: Skin is warm and dry.  Neurological:     Mental Status: She is alert.     Comments: Patient is alert and oriented. There is no abnormal phonation. Symmetric smile without facial droop.  Moves all extremities spontaneously. 5/5 strength in upper and lower extremities. . No sensation deficit. There is no nystagmus. EOMI, PERRL. Coordination intact with finger to nose and normal ambulation.    Psychiatric:     Comments: Patient appears anxious,     ED Results / Procedures / Treatments   Labs (all labs ordered are listed, but only abnormal results are displayed) Labs Reviewed  URINALYSIS, ROUTINE W REFLEX MICROSCOPIC - Abnormal; Notable for the following components:      Result Value   Specific Gravity, Urine 1.004 (*)    Leukocytes,Ua TRACE (*)    Bacteria, UA FEW (*)    All other components within normal limits  CBC WITH DIFFERENTIAL/PLATELET - Abnormal; Notable for the following components:   WBC 13.5 (*)    Neutro Abs 10.4 (*)    All other components within normal limits  COMPREHENSIVE METABOLIC PANEL - Abnormal; Notable for the following components:   CO2 21 (*)    Glucose, Bld 144 (*)    BUN 6 (*)    Albumin 3.4 (*)    AST 174 (*)    ALT 119 (*)    All other components within normal limits  RAPID URINE DRUG SCREEN, HOSP PERFORMED - Abnormal; Notable for the following components:   Tetrahydrocannabinol POSITIVE (*)    All other components within normal limits    EKG EKG Interpretation Date/Time:  Monday July 22 2023 15:44:56 EDT Ventricular Rate:  70 PR Interval:  120 QRS  Duration:  97 QT Interval:  450 QTC Calculation: 486 R Axis:   16  Text Interpretation: Sinus rhythm Low voltage, precordial leads Abnormal T, consider ischemia, diffuse leads no significant change since May 2024 Confirmed by Pricilla Loveless [phone]) on 07/22/2023 4:28:28 PM  Radiology MR LUMBAR SPINE WO CONTRAST Result Date: 07/22/2023 CLINICAL DATA:  Lower back pain, cauda equina syndrome. EXAM: MRI LUMBAR SPINE WITHOUT CONTRAST TECHNIQUE: Multiplanar, multisequence MR imaging of the lumbar spine was performed. No intravenous contrast was administered. COMPARISON:  None Available. FINDINGS: Segmentation:  Standard. Alignment:  Lumbar lordosis is maintained.  No listhesis. Vertebrae:  No fracture, evidence of discitis, or bone lesion. Conus medullaris and cauda equina: Conus extends to the T12-L1 level. Conus and cauda equina appear normal. Paraspinal and other soft tissues: The paraspinal musculature is unremarkable. 1.1 cm T1 hypointense, T2 hyperintense lesion in the left kidney likely reflecting a cyst. Disc levels: T12-L1: No significant disc bulge. No significant spinal canal stenosis or foraminal stenosis. L1-2: Minimal disc bulge. No significant spinal canal stenosis. No significant foraminal stenosis. L2-3: Minimal disc bulge. No significant spinal canal stenosis. No significant foraminal stenosis. L3-4: No significant disc bulge. No significant spinal canal stenosis or foraminal stenosis. L4-5: Disc bulge which indents the ventral thecal sac. Moderate facet arthrosis indents the dorsal thecal sac prominence of the dorsal epidural fat. There is resulting mild-to-moderate spinal canal stenosis. Mild-to-moderate right and mild left foraminal stenosis. L5-S1: Small disc bulge. Prominence of the epidural fat. Moderate bilateral facet arthrosis. Mild effacement of the thecal sac due to epidural fat. Moderate foraminal narrowing on the left secondary to disc bulge and facet osteophytes. IMPRESSION:  Degenerative changes in the lumbar spine most pronounced at L4-L5 and L5-S1. Mild to moderate spinal canal stenosis at L4-5. Epidural fat results in mild effacement of the thecal sac at L5-S1. Multilevel foraminal stenosis, greatest and moderate on the left at L5-S1. Electronically Signed   By: Emily Filbert M.D.   On: 07/22/2023 21:13    Procedures Procedures    Medications Ordered in ED Medications  LORazepam (ATIVAN) injection 2 mg (has no administration in time range)  busPIRone (BUSPAR) tablet 10 mg (10 mg Oral Given 07/22/23 2144)  clonazePAM (KLONOPIN) tablet 1 mg (1 mg Oral Given 07/22/23 2143)  mirtazapine (REMERON) tablet 45 mg (45 mg Oral Given 07/22/23 2144)  naltrexone (DEPADE) tablet 50 mg (has no administration in time range)  propranolol (INDERAL) tablet 20 mg (20 mg Oral Given 07/22/23 2143)  QUEtiapine (SEROQUEL) tablet 50 mg (50 mg Oral Given 07/22/23 2143)  rosuvastatin (CRESTOR) tablet 20 mg (has no administration in time range)  topiramate (TOPAMAX) tablet 25 mg (25 mg Oral Given 07/22/23 2143)  LORazepam (ATIVAN) tablet 2 mg (2 mg Oral Given 07/22/23 1540)  hydrOXYzine (VISTARIL) injection 50 mg (50 mg Intramuscular Given 07/22/23 1955)    ED Course/ Medical Decision Making/ A&P                                 Medical Decision Making Amount and/or Complexity of Data Reviewed Labs: ordered. Radiology: ordered.  Risk Prescription drug management.   This patient presents to the ED with chief complaint(s) of anxiety .  The complaint involves an extensive differential diagnosis and also carries with it a high risk of complications and morbidity.   pertinent past medical history as listed in HPI  The differential diagnosis includes  Panic attack, electrolyte abnormality, ACS, PE, mass effect, CVA, TIA, medication adverse effect  Additional history obtained: Additional history obtained from spouse Records reviewed Care Everywhere/External Records  Initial  Assessment:   Patient presents hypertensive 145/74 and tachypneic with complaints of persistent and worsening anxiety.  She has a well-established psychiatric history including anxiety, PTSD, bipolar 1 however her symptoms have notably worsened in the past few weeks.  States she has not slept in 3 to 4 days.  She endorses shortness of breath without chest pain.  Lung sounds are  clear on exam.  Endorses persistent dizziness without headache or extremity weakness.  She recently discontinued her olanzapine in the past few days, symptoms however have been ongoing before then.  Patient does endorse some diarrhea without abdominal pain or vomiting in addition to some reported urinary incontinence.  She has no dysuria or increased frequency to suggest cystitis.  She does endorse some chronic low back pain with radicular symptoms that appear to be unchanged.  She does endorse some mild SI joint tenderness on exam.  No history of spinal surgeries.  Bladder scan demonstrates 500 cc.  Given urinary retention with new onset back pain will obtain lumbar MRI.  Was last seen at this facility 5 days ago.  Noted to have mild elevated AST and ALT at 161 and 116 respectively.  She is interested and inpatient psychiatric evaluation.   Independent ECG interpretation:  Sinus rhythm with abnormal T waves unchanged  Independent labs interpretation:  The following labs were independently interpreted:  UA with trace leukocytes,, negative nitrates negative hemoglobin, few bacteria  Independent visualization and interpretation of imaging: I independently visualized the following imaging with scope of interpretation limited to determining acute life threatening conditions related to emergency care: Lumbar MRI, which revealed no acute abnormality, mild to moderate lumbar stenosis without evidence of cauda equina. Bladder scan with 501 cc   Treatment and Reassessment: Patient given Ativan following first assessment With persistent  symptoms.  Patient given hydroxyzine 50 Home meds given Straight cath performed-no evidence to support cauda equina on MRI  Consultations obtained:   TTS for psychiatric evaluation  Disposition:   Disposition pending psych recommendations. Dr. Judd Lien aware of patient.   Social Determinants of Health:   None  This note was dictated with voice recognition software.  Despite best efforts at proofreading, errors may have occurred which can change the documentation meaning.          Final Clinical Impression(s) / ED Diagnoses Final diagnoses:  Anxiety    Rx / DC Orders ED Discharge Orders     None         Halford Decamp, PA-C 07/23/23 0001    Pricilla Loveless, MD 07/23/23 1733

## 2023-07-22 NOTE — ED Triage Notes (Signed)
 Pt arrived via [facility] c/o recurring anxiety attacks X 3 days. Pt seen at [facility] 3 days ago for same. Pt presents in NAD in Triage.

## 2023-07-22 NOTE — B[facility] Counselor (Signed)
 TTS Note:    @ 9:34 PM-Patient was deferred to [facility] Telehealth Care Coordinator (TCC), [place], at [phone]. The [facility] Care Coordinator will notify the patient's care team when the [facility] provider is ready to initiate the telehealth assessment. If there are any questions, please reach out to the [facility] Coordinator.

## 2023-07-23 ENCOUNTER — Encounter (HOSPITAL_COMMUNITY): Payer: Self-pay | Admitting: Psychiatric/Mental Health

## 2023-07-23 DIAGNOSIS — F419 Anxiety disorder, unspecified: Secondary | ICD-10-CM | POA: Diagnosis not present

## 2023-07-23 DIAGNOSIS — F411 Generalized anxiety disorder: Secondary | ICD-10-CM

## 2023-07-23 MED ORDER — HYDROXYZINE HCL 25 MG PO TABS: 50.0000 mg | ORAL_TABLET | Freq: Three times a day (TID) | ORAL | Status: DC | PRN

## 2023-07-23 MED ORDER — HYDROXYZINE HCL 50 MG PO TABS: 25.0000 mg | ORAL_TABLET | Freq: Three times a day (TID) | ORAL | 1 refills | Status: DC | PRN

## 2023-07-23 MED ORDER — LORAZEPAM 1 MG PO TABS
2.0000 mg | ORAL_TABLET | Freq: Once | ORAL | Status: AC
  Administered 2023-07-23: 2 mg via ORAL
  Filled 2023-07-23: qty 2

## 2023-07-23 NOTE — BH Assessment (Signed)
 PER PHONE CALL    0636 Per Marcy Salvo, [facility] Coordinator, "patient is still on rounding list and we will reach out as soon as we have a time".

## 2023-07-23 NOTE — BH Assessment (Signed)
 PER Marcy Salvo, [facility] Coordinator, TTS Consult to see patient at [phone]

## 2023-07-23 NOTE — Consult Note (Signed)
 [facility] Consult Note  Patient Name: Dawn Kelley MRN: [id number] DOB: 04-Feb-1961 DATE OF Consult: 07/23/2023  PRIMARY PSYCHIATRIC DIAGNOSES  1.  Generalized Anxiety Disorder   RECOMMENDATIONS  Recommendations: Medication recommendations:  -- Increase Hydroxyzine 50mg  po TID PRN for anxiety   Otherwise, continue home medication regimen. Patient should follow up with outpatient psychiatrist to work with them for adjustments. Patient on several medications for anxiety which require weaning off slowly and safely under supervision.   Non-Medication/therapeutic recommendations: Follow up with outpatient psychiatrist. Would highly recommend initiating therapy services to better manage anxiety and stressors.   Is inpatient psychiatric hospitalization recommended for this patient?  -- Patient offered inpatient admission to meet with psychiatrist and adjust/ evaluate complex medication regimen however patient declined and does not meet criteria for IVC at this time. Recommend discharge.   Follow-Up Telepsychiatry C/L services: Psychiatry to sign off at this time. Please re-consult if necessary.   Communication: Treatment team members (and family members if applicable) who were involved in treatment/care discussions and planning, and with whom we spoke or engaged with via secure text/chat, include the following: Primary ED Team  Thank you for involving [place] in the care of this patient. If you have any additional questions or concerns, please call [phone] and ask for me or the provider on-call.  TELEPSYCHIATRY ATTESTATION & CONSENT  As the provider for this telehealth consult, I attest that I verified the patient's identity using two separate identifiers, introduced myself to the patient, provided my credentials, disclosed my location, and performed this encounter via a HIPAA-compliant, real-time, face-to-face, two-way, interactive audio and video platform and with the full consent and  agreement of the patient (or guardian as applicable.)  Patient physical location: ED in [facility]. Telehealth provider physical location: home office in state of [place].  Video start time: 0618 [facility] Time) Video end time: 0643 [facility] Time)   IDENTIFYING DATA  Dawn Kelley is a 63 y.o. year-old female for whom a psychiatric consultation has been ordered by the primary provider. The patient was identified using two separate identifiers.  CHIEF COMPLAINT/REASON FOR CONSULT  Anxiety   HISTORY OF PRESENT ILLNESS (HPI)  The patient is a 63yo female with past psychiatric history of Bipolar Disorder, OCD, PTSD, depression, anxiety who presented to the emergency department with concerns for worsening anxiety. Chart reviewed. Longstanding history of anxiety without relief.   Upon interview, patient is calm and cooperative, alert and oriented x4. No agitation observed. She reports chronic anxiety for the last 7 months. Has been working with outpatient psychiatrist for the last 1 year but feels it is not helpful. Does not see therapist because she had seen one in childhood and didn't find it helpful either. Patient states her anxiety is debilitating and affecting many aspects of her life: showering, dressing, driving, going out in public, sleeping. Wakes up multiple times per night and struggles to return to sleep due to racing thoughts, anxiety. Anxiety is generalized, no specific trigger or stressor. States she wakes up with anxiety daily. Has not found any alleviating factors. Decreased appetite. Complains of depression for the last 1 month due to ineffective treatment of anxiety. Patient states she feels overwhelmed and feels "it would be easier to go to bed and not wake up. Denies suicidal ideations. States she is not going to kill herself nor has thoughts to but feels death is more attractive than trying to manage her anxiety. No SI plan, intent. No HI. No AVH, paranoia, no  delusions  apparent otherwise. Is on several different medications and has trialed multiple others in the past. Does not find them helpful. Drinks 6-8 beers daily; last drink was yesterday morning. Also smokes marijuana frequently; last smoked on Sunday.       PAST PSYCHIATRIC HISTORY  Per chart review, psychiatric history of GAD, Depression, Bipolar I Disorder, Benzodiazepine Dependence, Cannabis Use Disorder.   Seen in the ED 07/17/2023 with complaints of worsening anxiety. Requested MD prescribed something "stronger" than Klonopin. Was instructed to follow up with outpatient psychiatrist for medication adjustments. Seen in the ED on 07/19/2023 with same complaints. Prescribed Hydroxyzine 25mg  TID PRN for anxiety.     Otherwise as per HPI above.  PAST MEDICAL HISTORY  Past Medical History:  Diagnosis Date   Anxiety    Bipolar 1 disorder (HCC)    Depression    OCD (obsessive compulsive disorder)    PTSD (post-traumatic stress disorder)    S/P ECT (electroconvulsive therapy)      HOME MEDICATIONS  Facility Ordered Medications  Medication   [COMPLETED] LORazepam (ATIVAN) tablet 2 mg   LORazepam (ATIVAN) injection 2 mg   [COMPLETED] hydrOXYzine (VISTARIL) injection 50 mg   busPIRone (BUSPAR) tablet 10 mg   clonazePAM (KLONOPIN) tablet 1 mg   mirtazapine (REMERON) tablet 45 mg   naltrexone (DEPADE) tablet 50 mg   propranolol (INDERAL) tablet 20 mg   QUEtiapine (SEROQUEL) tablet 50 mg   rosuvastatin (CRESTOR) tablet 20 mg   topiramate (TOPAMAX) tablet 25 mg   hydrOXYzine (VISTARIL) injection 50 mg   [COMPLETED] LORazepam (ATIVAN) tablet 2 mg   PTA Medications  Medication Sig   propranolol (INDERAL) 20 MG tablet Take 20 mg by mouth 3 (three) times daily.   levothyroxine (SYNTHROID) 25 MCG tablet Take 25 mcg by mouth daily before breakfast.   losartan (COZAAR) 25 MG tablet Take 25 mg by mouth daily.   Vilazodone HCl (VIIBRYD) 40 MG TABS Take 40 mg by mouth daily.   naltrexone  (DEPADE) 50 MG tablet Take 50 mg by mouth daily.   clonazePAM (KLONOPIN) 1 MG tablet Take 1 mg by mouth 2 (two) times daily as needed for anxiety.   mirtazapine (REMERON) 45 MG tablet Take 45 mg by mouth at bedtime.   ondansetron (ZOFRAN) 4 MG tablet Take 1 tablet (4 mg total) by mouth every 8 (eight) hours as needed for nausea or vomiting.   topiramate (TOPAMAX) 25 MG tablet Take 25 mg by mouth 2 (two) times daily.   hydrOXYzine (ATARAX) 25 MG tablet Take 1 tablet (25 mg total) by mouth every 8 (eight) hours as needed. May make drowsy, no driving when taking   rosuvastatin (CRESTOR) 20 MG tablet Take 20 mg by mouth daily.   pantoprazole (PROTONIX) 40 MG tablet take 1 tablet once daily.   busPIRone (BUSPAR) 10 MG tablet Take 10 mg by mouth 3 (three) times daily.   QUEtiapine (SEROQUEL) 50 MG tablet Take 50 mg by mouth at bedtime.   busPIRone (BUSPAR) 30 MG tablet Take 30 mg by mouth. One at bedtime (Patient not taking: Reported on 07/22/2023)     ALLERGIES  Allergies  Allergen Reactions   Escitalopram Nausea And Vomiting    Tolerated citalopram with out difficulty for many years.   Seroquel [Quetiapine Fumarate] Shortness Of Breath   Trazodone Other (See Comments)    Hives.    SOCIAL & SUBSTANCE USE HISTORY  Social History   Socioeconomic History   Marital status: Married    Spouse name: Not  on file   Number of children: Not on file   Years of education: Not on file   Highest education level: Not on file  Occupational History   Occupation: Disabled  Tobacco Use   Smoking status: Never    Passive exposure: Current   Smokeless tobacco: Never  Vaping Use   Vaping status: Never Used  Substance and Sexual Activity   Alcohol use: Not Currently    Alcohol/week: 10.0 standard drinks of alcohol    Types: 10 Cans of beer per week    Comment: daily   Drug use: Yes    Types: Marijuana    Comment: occassional   Sexual activity: Not Currently    Birth control/protection:  Post-menopausal  Other Topics Concern   Not on file  Social History Narrative   Pt lives in [place] with husband.  She is on disability.  Pt stated that she receives outpatient psychiatry services through [facility].  She does not receive outpatient therapy.   Social Drivers of Corporate investment banker Strain: Low Risk  (10/23/2022)   Overall Financial Resource Strain (CARDIA)    Difficulty of Paying Living Expenses: Not hard at all  Food Insecurity: No Food Insecurity (10/23/2022)   Hunger Vital Sign    Worried About Running Out of Food in the Last Year: Never true    Ran Out of Food in the Last Year: Never true  Transportation Needs: No Transportation Needs (10/23/2022)   PRAPARE - Administrator, Civil Service (Medical): No    Lack of Transportation (Non-Medical): No  Physical Activity: Insufficiently Active (10/23/2022)   Exercise Vital Sign    Days of Exercise per Week: 2 days    Minutes of Exercise per Session: 10 min  Stress: No Stress Concern Present (10/23/2022)   [employer] of Occupational Health - Occupational Stress Questionnaire    Feeling of Stress : Not at all  Social Connections: Moderately Isolated (10/23/2022)   Social Connection and Isolation Panel [NHANES]    Frequency of Communication with Friends and Family: More than three times a week    Frequency of Social Gatherings with Friends and Family: More than three times a week    Attends Religious Services: Never    Database administrator or Organizations: No    Attends Engineer, structural: Never    Marital Status: Married   Social History   Tobacco Use  Smoking Status Never   Passive exposure: Current  Smokeless Tobacco Never   Social History   Substance and Sexual Activity  Alcohol Use Not Currently   Alcohol/week: 10.0 standard drinks of alcohol   Types: 10 Cans of beer per week   Comment: daily   Social History   Substance and Sexual Activity  Drug Use Yes    Types: Marijuana   Comment: occassional    Additional pertinent information: lives with husband   FAMILY HISTORY  Family History  Problem Relation Age of Onset   Depression Mother    Pneumonia Mother      MENTAL STATUS EXAM (MSE)  Mental Status Exam: General Appearance: Casual and Fairly Groomed  Orientation:  Full (Time, Place, and Person)  Memory:  Recent;   Good  Concentration:  Concentration: Good  Recall:  Good  Attention  Good  Eye Contact:  Fair  Speech:  Clear and Coherent  Language:  Good  Volume:  Decreased  Mood: Anxious  Affect:  Appropriate  Thought Process:  Linear  Thought  Content:  Logical  Suicidal Thoughts:  No  Homicidal Thoughts:  No  Judgement:  Fair  Insight:  Fair  Psychomotor Activity:  Normal  Akathisia:  No  Fund of Knowledge:  Good    Assets:  Communication Skills Desire for Improvement Financial Resources/Insurance Housing Social Support  Cognition:  WNL  ADL's:  Intact  AIMS (if indicated):       VITALS  Blood pressure 138/82, pulse 81, temperature 97.7 F (36.5 C), temperature source Oral, resp. rate 20, height 5\' 6"  (1.676 m), weight 91.6 kg, SpO2 95%.  LABS  Admission on 07/22/2023  Component Date Value Ref Range Status   Color, Urine 07/22/2023 YELLOW  YELLOW Final   APPearance 07/22/2023 CLEAR  CLEAR Final   Specific Gravity, Urine 07/22/2023 1.004 (L)  1.005 - 1.030 Final   pH 07/22/2023 7.0  5.0 - 8.0 Final   Glucose, UA 07/22/2023 NEGATIVE  NEGATIVE mg/dL Final   Hgb urine dipstick 07/22/2023 NEGATIVE  NEGATIVE Final   Bilirubin Urine 07/22/2023 NEGATIVE  NEGATIVE Final   Ketones, ur 07/22/2023 NEGATIVE  NEGATIVE mg/dL Final   Protein, ur 16/02/9603 NEGATIVE  NEGATIVE mg/dL Final   Nitrite 54/01/8118 NEGATIVE  NEGATIVE Final   Leukocytes,Ua 07/22/2023 TRACE (A)  NEGATIVE Final   RBC / HPF 07/22/2023 0-5  0 - 5 RBC/hpf Final   WBC, UA 07/22/2023 6-10  0 - 5 WBC/hpf Final   Bacteria, UA 07/22/2023 FEW (A)  NONE SEEN  Final   Squamous Epithelial / HPF 07/22/2023 0-5  0 - 5 /HPF Final   Mucus 07/22/2023 PRESENT   Final   Performed at [facility], [address], [place], [place] [address]   WBC 07/22/2023 13.5 (H)  4.0 - 10.5 K/uL Final   RBC 07/22/2023 4.66  3.87 - 5.11 MIL/uL Final   Hemoglobin 07/22/2023 14.8  12.0 - 15.0 g/dL Final   HCT 95/62/1308 43.4  36.0 - 46.0 % Final   MCV 07/22/2023 93.1  80.0 - 100.0 fL Final   MCH 07/22/2023 31.8  26.0 - 34.0 pg Final   MCHC 07/22/2023 34.1  30.0 - 36.0 g/dL Final   RDW 65/78/4696 13.7  11.5 - 15.5 % Final   Platelets 07/22/2023 361  150 - 400 K/uL Final   nRBC 07/22/2023 0.0  0.0 - 0.2 % Final   Neutrophils Relative % 07/22/2023 76  % Final   Neutro Abs 07/22/2023 10.4 (H)  1.7 - 7.7 K/uL Final   Lymphocytes Relative 07/22/2023 16  % Final   Lymphs Abs 07/22/2023 2.1  0.7 - 4.0 K/uL Final   Monocytes Relative 07/22/2023 5  % Final   Monocytes Absolute 07/22/2023 0.7  0.1 - 1.0 K/uL Final   Eosinophils Relative 07/22/2023 1  % Final   Eosinophils Absolute 07/22/2023 0.1  0.0 - 0.5 K/uL Final   Basophils Relative 07/22/2023 1  % Final   Basophils Absolute 07/22/2023 0.1  0.0 - 0.1 K/uL Final   Immature Granulocytes 07/22/2023 1  % Final   Abs Immature Granulocytes 07/22/2023 0.07  0.00 - 0.07 K/uL Final   Performed at [facility], [address] [place], [place] [address]   Sodium 07/22/2023 136  135 - 145 mmol/L Final   Potassium 07/22/2023 3.5  3.5 - 5.1 mmol/L Final   Chloride 07/22/2023 101  98 - 111 mmol/L Final   CO2 07/22/2023 21 (L)  22 - 32 mmol/L Final   Glucose, Bld 07/22/2023 144 (H)  70 - 99 mg/dL Final  Glucose reference range applies only to samples taken after fasting for at least 8 hours.   BUN 07/22/2023 6 (L)  8 - 23 mg/dL Final   Creatinine, Ser 07/22/2023 0.57  0.44 - 1.00 mg/dL Final   Calcium 16/02/9603 8.9  8.9 - 10.3 mg/dL Final   Total Protein 54/01/8118 7.4  6.5 - 8.1 g/dL Final   Albumin 14/78/2956 3.4 (L)  3.5 - 5.0  g/dL Final   AST 21/30/8657 174 (H)  15 - 41 U/L Final   ALT 07/22/2023 119 (H)  0 - 44 U/L Final   Alkaline Phosphatase 07/22/2023 123  38 - 126 U/L Final   Total Bilirubin 07/22/2023 0.7  0.0 - 1.2 mg/dL Final   GFR, Estimated 07/22/2023 >60  >60 mL/min Final   Comment: (NOTE) Calculated using the CKD-EPI Creatinine Equation (2021)    Anion gap 07/22/2023 14  5 - 15 Final   Performed at [facility], [address], [place], [place] [address]   Opiates 07/22/2023 NONE DETECTED  NONE DETECTED Final   Cocaine 07/22/2023 NONE DETECTED  NONE DETECTED Final   Benzodiazepines 07/22/2023 NONE DETECTED  NONE DETECTED Final   Amphetamines 07/22/2023 NONE DETECTED  NONE DETECTED Final   Tetrahydrocannabinol 07/22/2023 POSITIVE (A)  NONE DETECTED Final   Barbiturates 07/22/2023 NONE DETECTED  NONE DETECTED Final   Comment: (NOTE) DRUG SCREEN FOR MEDICAL PURPOSES ONLY.  IF CONFIRMATION IS NEEDED FOR ANY PURPOSE, NOTIFY LAB WITHIN 5 DAYS.  LOWEST DETECTABLE LIMITS FOR URINE DRUG SCREEN Drug Class                     Cutoff (ng/mL) Amphetamine and metabolites    1000 Barbiturate and metabolites    200 Benzodiazepine                 200 Opiates and metabolites        300 Cocaine and metabolites        300 THC                            50 Performed at [facility], [address], [place], [place] [address]     PSYCHIATRIC REVIEW OF SYSTEMS (ROS)  ROS: Notable for the following relevant positive findings: Review of Systems  Psychiatric/Behavioral:  Positive for depression. Negative for suicidal ideas. The patient is nervous/anxious and has insomnia.     Additional findings:      Musculoskeletal: No abnormal movements observed      Gait & Station: Laying/Sitting      Pain Screening: Denies      Nutrition & Dental Concerns: Decrease in food intake and/or loss of appetite  RISK FORMULATION/ASSESSMENT  Is the patient experiencing any suicidal or homicidal ideations: No       Explain if  yes:  Protective factors considered for safety management: access to care, family/ social support, housing, outpatient established team  Risk factors/concerns considered for safety management: anxiety Depression Substance abuse/dependence Age over 39  Is there a safety management plan with the patient and treatment team to minimize risk factors and promote protective factors: Yes           Explain: Currently in the ED, medication management, follow up with outpatient psychiatry, recommend therapy in community  Is crisis care placement or psychiatric hospitalization recommended: No     Based on my current evaluation and risk assessment, patient is determined at this time to be at:  Low risk  *RISK ASSESSMENT Risk assessment is a dynamic process; it is possible that this patient's condition, and risk level, may change. This should be re-evaluated and managed over time as appropriate. Please re-consult psychiatric consult services if additional assistance is needed in terms of risk assessment and management. If your team decides to discharge this patient, please advise the patient how to best access emergency psychiatric services, or to call 911, if their condition worsens or they feel unsafe in any way.   Assunta Gambles, NP Telepsychiatry Consult Services

## 2023-07-23 NOTE — ED Notes (Signed)
 Pt c/o having severe anxiety, requesting medication to calm her down

## 2023-07-23 NOTE — Discharge Instructions (Signed)
 Follow-up with your family doctor or your counselor next week

## 2023-07-24 ENCOUNTER — Other Ambulatory Visit: Payer: Self-pay

## 2023-07-24 ENCOUNTER — Encounter (HOSPITAL_COMMUNITY): Payer: Self-pay | Admitting: Emergency Medicine

## 2023-07-24 ENCOUNTER — Emergency Department (HOSPITAL_COMMUNITY)
Admission: EM | Admit: 2023-07-24 | Discharge: 2023-07-24 | Discharge status: Against Medical Advice | Attending: Emergency Medicine | Admitting: Emergency Medicine

## 2023-07-24 DIAGNOSIS — Z743 Need for continuous supervision: Secondary | ICD-10-CM | POA: Diagnosis not present

## 2023-07-24 DIAGNOSIS — E079 Disorder of thyroid, unspecified: Secondary | ICD-10-CM | POA: Diagnosis not present

## 2023-07-24 DIAGNOSIS — F419 Anxiety disorder, unspecified: Secondary | ICD-10-CM | POA: Diagnosis present

## 2023-07-24 DIAGNOSIS — R6889 Other general symptoms and signs: Secondary | ICD-10-CM | POA: Diagnosis not present

## 2023-07-24 DIAGNOSIS — Z5321 Procedure and treatment not carried out due to patient leaving prior to being seen by health care provider: Secondary | ICD-10-CM | POA: Diagnosis not present

## 2023-07-24 DIAGNOSIS — Z79899 Other long term (current) drug therapy: Secondary | ICD-10-CM | POA: Diagnosis not present

## 2023-07-24 DIAGNOSIS — E785 Hyperlipidemia, unspecified: Secondary | ICD-10-CM | POA: Diagnosis not present

## 2023-07-24 DIAGNOSIS — Z91040 Latex allergy status: Secondary | ICD-10-CM | POA: Diagnosis not present

## 2023-07-24 DIAGNOSIS — I10 Essential (primary) hypertension: Secondary | ICD-10-CM | POA: Diagnosis not present

## 2023-07-24 NOTE — ED Notes (Signed)
 Patient out of room stating she is "going to the waiting room". When asked if she did not want to wait to be seen by the doctor, patient states that she wants to wait for the doctor in the waiting room because it is "too cold back here". Patient reassured the doctor will be in as soon as possible and that she must remain in her room

## 2023-07-24 NOTE — ED Notes (Signed)
 Patient out of room at desk asking when the doctor will see her. This nurse and an Psychologist, forensic at same location also explaining to patient that she needs to stay in her room and the doctor will be as soon as possible. Patient asks officer how to leave the department. Patient ambulated out of department to waiting room

## 2023-07-24 NOTE — ED Notes (Signed)
 Patient out of room requesting Alka seltzer.

## 2023-07-24 NOTE — ED Triage Notes (Signed)
 Pt c/o increased anxiety today, she was released this morning for the same.

## 2023-07-24 NOTE — ED Notes (Signed)
 Nurse in room to obtain patient EKG. Patient very anxious. Verbal reassurance provided

## 2023-07-24 NOTE — ED Notes (Signed)
 Patient out of room stating she "feels like she is going to pass out". Patient assisted back to room, and instructed to stay in her bed if she truly felt faint and that she should not be up walking around. Patient also states she "does not want to be in this room"; explained to patient that there are no other rooms available at this time

## 2023-07-25 ENCOUNTER — Other Ambulatory Visit: Payer: Self-pay

## 2023-07-25 ENCOUNTER — Encounter (HOSPITAL_COMMUNITY): Payer: Self-pay

## 2023-07-25 ENCOUNTER — Emergency Department (HOSPITAL_COMMUNITY): Admission: EM | Admit: 2023-07-25 | Discharge: 2023-07-27 | Disposition: A | Discharge status: Home / Self Care

## 2023-07-25 DIAGNOSIS — Z79899 Other long term (current) drug therapy: Secondary | ICD-10-CM | POA: Diagnosis not present

## 2023-07-25 DIAGNOSIS — F1020 Alcohol dependence, uncomplicated: Secondary | ICD-10-CM | POA: Diagnosis not present

## 2023-07-25 DIAGNOSIS — F411 Generalized anxiety disorder: Secondary | ICD-10-CM | POA: Diagnosis present

## 2023-07-25 DIAGNOSIS — R404 Transient alteration of awareness: Secondary | ICD-10-CM | POA: Diagnosis not present

## 2023-07-25 DIAGNOSIS — R45851 Suicidal ideations: Secondary | ICD-10-CM

## 2023-07-25 DIAGNOSIS — R930 Abnormal findings on diagnostic imaging of skull and head, not elsewhere classified: Secondary | ICD-10-CM | POA: Diagnosis not present

## 2023-07-25 DIAGNOSIS — F1320 Sedative, hypnotic or anxiolytic dependence, uncomplicated: Secondary | ICD-10-CM | POA: Diagnosis present

## 2023-07-25 DIAGNOSIS — Z743 Need for continuous supervision: Secondary | ICD-10-CM | POA: Diagnosis not present

## 2023-07-25 DIAGNOSIS — F419 Anxiety disorder, unspecified: Secondary | ICD-10-CM | POA: Insufficient documentation

## 2023-07-25 DIAGNOSIS — Z20822 Contact with and (suspected) exposure to covid-19: Secondary | ICD-10-CM | POA: Diagnosis not present

## 2023-07-25 LAB — COMPREHENSIVE METABOLIC PANEL
ALT: 83 U/L — ABNORMAL HIGH (ref 0–44)
AST: 112 U/L — ABNORMAL HIGH (ref 15–41)
Albumin: 3.4 g/dL — ABNORMAL LOW (ref 3.5–5.0)
Alkaline Phosphatase: 106 U/L (ref 38–126)
Anion gap: 14 (ref 5–15)
BUN: 6 mg/dL — ABNORMAL LOW (ref 8–23)
CO2: 19 mmol/L — ABNORMAL LOW (ref 22–32)
Calcium: 9.2 mg/dL (ref 8.9–10.3)
Chloride: 106 mmol/L (ref 98–111)
Creatinine, Ser: 0.67 mg/dL (ref 0.44–1.00)
GFR, Estimated: >60 mL/min (ref >60)
Glucose, Bld: 132 mg/dL — ABNORMAL HIGH (ref 70–99)
Potassium: 3.4 mmol/L — ABNORMAL LOW (ref 3.5–5.1)
Sodium: 139 mmol/L (ref 135–145)
Total Bilirubin: 0.6 mg/dL (ref 0.0–1.2)
Total Protein: 7.3 g/dL (ref 6.5–8.1)

## 2023-07-25 LAB — CBC WITH DIFFERENTIAL/PLATELET
Abs Immature Granulocytes: 0.06 10*3/uL (ref 0.00–0.07)
Basophils Absolute: 0.1 10*3/uL (ref 0.0–0.1)
Basophils Relative: 1 %
Eosinophils Absolute: 0.1 10*3/uL (ref 0.0–0.5)
Eosinophils Relative: 1 %
HCT: 41.4 % (ref 36.0–46.0)
Hemoglobin: 14 g/dL (ref 12.0–15.0)
Immature Granulocytes: 1 %
Lymphocytes Relative: 21 %
Lymphs Abs: 2.5 10*3/uL (ref 0.7–4.0)
MCH: 32.2 pg (ref 26.0–34.0)
MCHC: 33.8 g/dL (ref 30.0–36.0)
MCV: 95.2 fL (ref 80.0–100.0)
Monocytes Absolute: 0.7 10*3/uL (ref 0.1–1.0)
Monocytes Relative: 6 %
Neutro Abs: 8.5 10*3/uL — ABNORMAL HIGH (ref 1.7–7.7)
Neutrophils Relative %: 70 %
Platelets: 344 10*3/uL (ref 150–400)
RBC: 4.35 MIL/uL (ref 3.87–5.11)
RDW: 14.2 % (ref 11.5–15.5)
WBC: 12 10*3/uL — ABNORMAL HIGH (ref 4.0–10.5)
nRBC: 0 % (ref 0.0–0.2)

## 2023-07-25 LAB — ETHANOL: Alcohol, Ethyl (B): <10 mg/dL (ref <10)

## 2023-07-25 LAB — RAPID URINE DRUG SCREEN, HOSP PERFORMED
Amphetamines: NOT DETECTED
Barbiturates: NOT DETECTED
Benzodiazepines: NOT DETECTED
Cocaine: NOT DETECTED
Opiates: NOT DETECTED
Tetrahydrocannabinol: POSITIVE — AB

## 2023-07-25 LAB — ACETAMINOPHEN LEVEL: Acetaminophen (Tylenol), Serum: <10 ug/mL — ABNORMAL LOW (ref 10–30)

## 2023-07-25 LAB — SALICYLATE LEVEL: Salicylate Lvl: <7 mg/dL — ABNORMAL LOW (ref 7.0–30.0)

## 2023-07-25 MED ORDER — LORAZEPAM 1 MG PO TABS
2.0000 mg | ORAL_TABLET | Freq: Once | ORAL | Status: AC
  Administered 2023-07-25: 2 mg via ORAL
  Filled 2023-07-25: qty 2

## 2023-07-25 MED ORDER — LORAZEPAM 2 MG/ML IJ SOLN: 2.0000 mg | Freq: Once | INTRAMUSCULAR | Status: DC

## 2023-07-25 NOTE — ED Notes (Signed)
IVC paperwork faxed to [facility] @ [phone] & [phone] 

## 2023-07-25 NOTE — ED Provider Notes (Signed)
 [place] EMERGENCY DEPARTMENT AT [facility] Provider Note   CSN: [id number] Arrival date & time: 07/25/23  1517     History  Chief Complaint  Patient presents with   Medical Clearance    Dawn Kelley is a 63 y.o. female.  63 year old female with past medical history of anxiety and hyperlipidemia presenting to the emergency department today after she apparently made some suicidal statements.  The patient followed up with her doctor today and states she has been having a lot of panic attacks recently.  She followed up and was told that she was on high doses of medications and that they could not help her.  She apparently made a statement that she wanted to kill herself.  She is denying this currently.  She did make some other statements to our staff here in the emergency department when she was changing and apparently did state that she was suicidal.  She denies any homicidal ideations.  Denies any visual or auditory hallucinations.  She does report that she is very anxious.        Home Medications Prior to Admission medications   Medication Sig Start Date End Date Taking? Authorizing Provider  busPIRone (BUSPAR) 10 MG tablet Take 10 mg by mouth 3 (three) times daily. 07/03/23  Yes [provider]  clonazePAM (KLONOPIN) 1 MG tablet Take 1 mg by mouth 2 (two) times daily as needed for anxiety.   Yes [provider]  hydrOXYzine (ATARAX) 50 MG tablet Take 0.5 tablets (25 mg total) by mouth every 8 (eight) hours as needed for anxiety. 07/23/23  Yes Bethann Berkshire, MD  ibuprofen (ADVIL) 600 MG tablet Take 600 mg by mouth every 6 (six) hours as needed for mild pain (pain score 1-3) or moderate pain (pain score 4-6).   Yes [provider]  levothyroxine (SYNTHROID) 25 MCG tablet Take 25 mcg by mouth daily before breakfast.   Yes [provider]  losartan (COZAAR) 25 MG tablet Take 25 mg by mouth daily.   Yes [provider]   mirtazapine (REMERON) 45 MG tablet Take 45 mg by mouth at bedtime.   Yes [provider]  naltrexone (DEPADE) 50 MG tablet Take 50 mg by mouth daily.   Yes [provider]  ondansetron (ZOFRAN) 4 MG tablet Take 1 tablet (4 mg total) by mouth every 8 (eight) hours as needed for nausea or vomiting. 08/28/22  Yes Milian, Aleen Campi, FNP  pantoprazole (PROTONIX) 40 MG tablet take 1 tablet once daily. 06/10/23  Yes Carlan, Chelsea L, NP  propranolol (INDERAL) 20 MG tablet Take 20 mg by mouth 3 (three) times daily. 03/29/20  Yes [provider]  QUEtiapine (SEROQUEL) 50 MG tablet Take 50 mg by mouth at bedtime. 07/03/23  Yes [provider]  rosuvastatin (CRESTOR) 20 MG tablet Take 20 mg by mouth daily. 10/15/22  Yes [provider]  topiramate (TOPAMAX) 25 MG tablet Take 25 mg by mouth 2 (two) times daily. 09/10/22  Yes [provider]  Vilazodone HCl (VIIBRYD) 40 MG TABS Take 40 mg by mouth daily.   Yes [provider]      Allergies    Escitalopram, Seroquel [quetiapine fumarate], and Trazodone    Review of Systems   Review of Systems  Psychiatric/Behavioral:  The patient is nervous/anxious.   All other systems reviewed and are negative.   Physical Exam Updated Vital Signs BP 138/71   Pulse 72   Resp 12   Ht 5'  5" (1.651 m)   Wt 90.7 kg   SpO2 100%   BMI 33.28 kg/m  Physical Exam Vitals and nursing note reviewed.   Gen: NAD Eyes: PERRL, EOMI HEENT: no oropharyngeal swelling Neck: trachea midline Resp: clear to auscultation bilaterally Card: RRR, no murmurs, rubs, or gallops Abd: nontender, nondistended Extremities: no calf tenderness, no edema Vascular: 2+ radial pulses bilaterally, 2+ DP pulses bilaterally Skin: no rashes Psyc: Anxious appearing   ED Results / Procedures / Treatments   Labs (all labs ordered are listed, but only abnormal results are displayed) Labs Reviewed  COMPREHENSIVE METABOLIC PANEL -  Abnormal; Notable for the following components:      Result Value   Potassium 3.4 (*)    CO2 19 (*)    Glucose, Bld 132 (*)    BUN 6 (*)    Albumin 3.4 (*)    AST 112 (*)    ALT 83 (*)    All other components within normal limits  RAPID URINE DRUG SCREEN, HOSP PERFORMED - Abnormal; Notable for the following components:   Tetrahydrocannabinol POSITIVE (*)    All other components within normal limits  CBC WITH DIFFERENTIAL/PLATELET - Abnormal; Notable for the following components:   WBC 12.0 (*)    Neutro Abs 8.5 (*)    All other components within normal limits  SALICYLATE LEVEL - Abnormal; Notable for the following components:   Salicylate Lvl <7.0 (*)    All other components within normal limits  ACETAMINOPHEN LEVEL - Abnormal; Notable for the following components:   Acetaminophen (Tylenol), Serum <10 (*)    All other components within normal limits  ETHANOL    EKG None  Radiology No results found.  Procedures Procedures    Medications Ordered in ED Medications  LORazepam (ATIVAN) tablet 2 mg (2 mg Oral Given 07/25/23 1646)    ED Course/ Medical Decision Making/ A&P                                 Medical Decision Making 63 year old female past medical history of anxiety and hyperlipidemia presenting to the emergency department today with concern for suicidal ideations.  The patient is denying these currently but she apparently did tell staff at her doctor's office as well as some of our staff in the emergency department that she is having suicidal thoughts.  I will place patient on an IVC.  Will obtain basic labs here for medical clearance.  I will have her evaluated by our psychiatry team to further evaluate whether she would be appropriate for inpatient versus outpatient treatment but since she has made statements twice today I think that she does warrant IVC placement and further evaluation by psychiatry.  I will give her Ativan for her anxiety.  The patient's labs  here are reassuring.  She is medically cleared for psychiatric evaluation.  Amount and/or Complexity of Data Reviewed Labs: ordered.  Risk Prescription drug management.           Final Clinical Impression(s) / ED Diagnoses Final diagnoses:  Suicidal ideation    Rx / DC Orders ED Discharge Orders     None         Durwin Glaze, MD 07/25/23 Rickey Primus

## 2023-07-25 NOTE — ED Notes (Signed)
 While changing in the bathroom the patient told me "I am suicidal but I'm going to tell my nurse that I don't want to kill myself. I don't want to be here for days. That kid has been here for days".

## 2023-07-25 NOTE — ED Notes (Signed)
 Took patient to the restroom. She started urinating in the toilet and I asked her to catch the sample. She stopped peeing, got up and said she couldn't pee anymore.

## 2023-07-25 NOTE — ED Notes (Signed)
 Patient was dressed out.

## 2023-07-25 NOTE — ED Notes (Signed)
 Security called so patient can be wanded.
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
## 2023-07-25 NOTE — ED Triage Notes (Signed)
 Pt bib EMS from [facility] d/t panic attacks and SI. Was told in report pt was SI and had no plan. Pt states she had a panic attack because she was tired of feeling this way but was unable to tell staff how she was feeling. Pt told nurse tech in the restroom that she was SI, but told paramedic that she was no SI. Pt states she is just frustrated.

## 2023-07-26 ENCOUNTER — Emergency Department (HOSPITAL_COMMUNITY)

## 2023-07-26 ENCOUNTER — Telehealth: Payer: Self-pay

## 2023-07-26 DIAGNOSIS — F1020 Alcohol dependence, uncomplicated: Secondary | ICD-10-CM | POA: Diagnosis present

## 2023-07-26 DIAGNOSIS — F411 Generalized anxiety disorder: Secondary | ICD-10-CM

## 2023-07-26 DIAGNOSIS — F1320 Sedative, hypnotic or anxiolytic dependence, uncomplicated: Secondary | ICD-10-CM | POA: Diagnosis not present

## 2023-07-26 LAB — CBG MONITORING, ED: Glucose-Capillary: 219 mg/dL — ABNORMAL HIGH (ref 70–99)

## 2023-07-26 MED ORDER — THIAMINE MONONITRATE 100 MG PO TABS
100.0000 mg | ORAL_TABLET | Freq: Every day | ORAL | Status: DC
  Administered 2023-07-26 – 2023-07-27 (×2): 100 mg via ORAL
  Filled 2023-07-26 (×2): qty 1

## 2023-07-26 MED ORDER — LORAZEPAM 1 MG PO TABS
1.0000 mg | ORAL_TABLET | ORAL | Status: DC | PRN
  Administered 2023-07-27: 3 mg via ORAL
  Filled 2023-07-26: qty 3

## 2023-07-26 MED ORDER — PROPRANOLOL HCL 10 MG PO TABS
20.0000 mg | ORAL_TABLET | Freq: Three times a day (TID) | ORAL | Status: DC
  Administered 2023-07-26 – 2023-07-27 (×4): 20 mg via ORAL
  Filled 2023-07-26 (×5): qty 2

## 2023-07-26 MED ORDER — LORAZEPAM 2 MG/ML IJ SOLN: 1.0000 mg | INTRAMUSCULAR | Status: DC | PRN

## 2023-07-26 MED ORDER — LORAZEPAM 1 MG PO TABS
1.0000 mg | ORAL_TABLET | Freq: Once | ORAL | Status: AC
  Administered 2023-07-26: 1 mg via ORAL
  Filled 2023-07-26: qty 1

## 2023-07-26 MED ORDER — THIAMINE HCL 100 MG/ML IJ SOLN: 100.0000 mg | Freq: Every day | INTRAMUSCULAR | Status: DC

## 2023-07-26 MED ORDER — ADULT MULTIVITAMIN W/MINERALS CH
1.0000 | ORAL_TABLET | Freq: Every day | ORAL | Status: DC
Start: 2023-07-26 — End: 2023-07-28
  Administered 2023-07-26 – 2023-07-27 (×2): 1 via ORAL
  Filled 2023-07-26 (×2): qty 1

## 2023-07-26 MED ORDER — LITHIUM CARBONATE ER 300 MG PO TBCR
300.0000 mg | EXTENDED_RELEASE_TABLET | Freq: Two times a day (BID) | ORAL | Status: DC
  Administered 2023-07-26 – 2023-07-27 (×3): 300 mg via ORAL
  Filled 2023-07-26 (×4): qty 1

## 2023-07-26 MED ORDER — HYDROXYZINE HCL 25 MG PO TABS
50.0000 mg | ORAL_TABLET | Freq: Three times a day (TID) | ORAL | Status: DC | PRN
  Administered 2023-07-26 – 2023-07-27 (×5): 50 mg via ORAL
  Filled 2023-07-26 (×5): qty 2

## 2023-07-26 MED ORDER — OLANZAPINE 5 MG PO TBDP: 10.0000 mg | ORAL_TABLET | Freq: Every day | ORAL | Status: DC

## 2023-07-26 MED ORDER — OLANZAPINE 5 MG PO TBDP
10.0000 mg | ORAL_TABLET | ORAL | Status: AC
  Administered 2023-07-26: 10 mg via ORAL
  Filled 2023-07-26: qty 2

## 2023-07-26 MED ORDER — GABAPENTIN 100 MG PO CAPS
100.0000 mg | ORAL_CAPSULE | Freq: Three times a day (TID) | ORAL | Status: DC
  Administered 2023-07-26 – 2023-07-27 (×5): 100 mg via ORAL
  Filled 2023-07-26 (×5): qty 1

## 2023-07-26 NOTE — ED Notes (Signed)
 Patient making phone call to husband.

## 2023-07-26 NOTE — ED Notes (Signed)
 Pt states that she is having severe anxiety and requests medication, EDP notified, order received.
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
## 2023-07-26 NOTE — ED Notes (Signed)
 Patient back in bed, sitter at the bedside.

## 2023-07-26 NOTE — ED Provider Notes (Signed)
 Emergency Medicine Observation Re-evaluation Note  Dawn Kelley is a 63 y.o. female, seen on rounds today.  Pt initially presented to the ED for complaints of Medical Clearance Currently, the patient is waiting placement.  Physical Exam  BP (!) 146/85   Pulse 79   Temp 97.8 F (36.6 C) (Oral)   Resp 18   Ht 1.651 m (5\' 5" )   Wt 90.7 kg   SpO2 100%   BMI 33.28 kg/m  Physical Exam General: No distress Cardiac: Normal heart rate Lungs: Clear lung sounds Psych: Redirectable, calm  ED Course / MDM  EKG:   I have reviewed the labs performed to date as well as medications administered while in observation.  Recent changes in the last 24 hours include pending placement, neurologic evaluation has been completed, see separate note.  Plan  Current plan is for CT scan which was unremarkable, hopefully placement in the morning.    Eber Hong, MD 07/26/23 2218

## 2023-07-26 NOTE — Telephone Encounter (Signed)
 Copied from CRM [id number]. Topic: General - Call Back - No Documentation >> Jul 26, 2023  1:01 PM Marland Kitchen D wrote: Stephannie Li called on patient's behalf wanting to know will patient be able to still be seen and remain a patient there. Patient is currently in the hospital.  Call back- [phone]

## 2023-07-26 NOTE — ED Notes (Signed)
 Patient using ER phone to talk to husband at this time. Sitter with patient.

## 2023-07-26 NOTE — ED Notes (Signed)
 Patient care taken, resting in the hall way, has had ativan and now appears to be sleeping with even respirations and chest rise and fall.

## 2023-07-26 NOTE — ED Provider Notes (Signed)
 CT scan negative:  Neurological evaluation:  This patient is able to answer questions, she is able to follow commands, she moves all 4 extremities with good coordination and has been able to ambulate around the department multiple times.  I have witnessed her gait which is normal, she has normal strength in all 4 extremities and no facial droop, cranial nerves III through XII are unremarkable.  This patient is neurologically cleared to be seen by a psychiatrist and transferred to an inpatient facility   Eber Hong, MD 07/26/23 2218

## 2023-07-26 NOTE — ED Notes (Signed)
 Pt feeling anxious at this time. Pt given word searches to do.

## 2023-07-26 NOTE — ED Notes (Signed)
 Aid taking patient outside to the courtyard at this time. Patient feeling anxious and restless about being in hospital, doesn't feel like sitting or laying in bed. Patient otherwise cooperative.

## 2023-07-26 NOTE — ED Notes (Signed)
 patient is declining to have mri done, even if given anxiety medicine before. she says she could possibly handle a CT scan. Hyacinth Meeker and tts NP notified. CT ordered. Scan is needed to rule out neurology complications in order for inpatient placement/acceptance d/t patient reports mini stroke 15 years ago, denies any neurological complaints at this time.

## 2023-07-27 DIAGNOSIS — F411 Generalized anxiety disorder: Secondary | ICD-10-CM | POA: Diagnosis not present

## 2023-07-27 LAB — URINALYSIS, ROUTINE W REFLEX MICROSCOPIC
Bilirubin Urine: NEGATIVE
Glucose, UA: NEGATIVE mg/dL
Hgb urine dipstick: NEGATIVE
Ketones, ur: NEGATIVE mg/dL
Leukocytes,Ua: NEGATIVE
Nitrite: NEGATIVE
Protein, ur: NEGATIVE mg/dL
Specific Gravity, Urine: 1.009 (ref 1.005–1.030)
pH: 8 (ref 5.0–8.0)

## 2023-07-27 LAB — RESP PANEL BY RT-PCR (RSV, FLU A&B, COVID)  RVPGX2
Influenza A by PCR: NEGATIVE
Influenza B by PCR: NEGATIVE
Resp Syncytial Virus by PCR: NEGATIVE
SARS Coronavirus 2 by RT PCR: NEGATIVE

## 2023-07-27 NOTE — ED Provider Notes (Signed)
 Emergency Medicine Observation Re-evaluation Note  Dawn Kelley is a 63 y.o. female, seen on rounds today.  Pt initially presented to the ED for complaints of Medical Clearance Currently, the patient is awaiting psychiatric placement.  Physical Exam  BP (!) 114/55   Pulse (!) 59   Temp 97.8 F (36.6 C) (Oral)   Resp 14   Ht 5\' 5"  (1.651 m)   Wt 90.7 kg   SpO2 99%   BMI 33.28 kg/m  Physical Exam Alert and in no acute distress ED Course / MDM  EKG:   I have reviewed the labs performed to date as well as medications administered while in observation.  Recent changes in the last 24 hours include patient had a CT scan of the head that was negative and is medically cleared for psychiatric admission.  Plan  Current plan is for psychiatric admission.    Bethann Berkshire, MD 07/27/23 [phone]

## 2023-07-27 NOTE — Progress Notes (Signed)
 LCSW Progress Note:   Dawn Kelley MRN: [id number]  07/27/2023 11:26 AM  Per [facility] provider Phebe Colla, NP, inpatient psychiatric hospitalization is recommended after medical hospitalization. The patient was involuntarily committed on 07/25/2023. I contacted [facility], Malva Limes, RN, who informed me that there are no beds available at this time, and the patient requires geriatric psychiatry. In the interim, the patient has been faxed to the following facilities for consideration of bed placement. Please see the hospitals listed below.   Destination  Service Provider   Address Phone Fax Patient Preferred  [facility]   [address] [place] [address] [phone] --  [facility] [address]   [place] [address] [phone] --  [facility]   [address] [place] [address] [phone] --  [facility] [address]   [address] [place] [address] [phone] --  [facility]   [address] [place], [place] [address] [phone] --  [facility]   [address] [place] [address] [phone] --  [facility]   [address] [place], [place] [address] [phone] --  [facility]   [address], [place] [address] [phone] --  [facility]   [address] [place] [address] [phone] --  [facility]   [address] [place], [place] [address] [phone] --  [facility]   [address] [place] [address] [phone] --  [facility]   [address] [place] [address] [phone] --  [facility]   [address] [place] [address] [phone] --  [facility]   [address], [place] [address] [phone] --  [facility]   [address] [place] [address] [phone] --  [facility]   [address] [place] [address] [phone] [facility]   [place] [phone] --  [facility]   [address] [place] [address] [phone] --  [facility]   [address], [place] [address] [phone] --  [facility]   [address] [place], [place] [phone] --  [facility]   [address] [place] [address] [phone] --  [facility]   [address], [place] [address] [phone] --  [facility]   [address] [place], [place] [address] [phone] --  [facility]   [address], [place] [address] [phone] --  [facility]   [address] [place] [address] [phone] --  [facility]   [address] [place], [place] [address] [phone] --  [facility]   [address], [place] [address] [phone] --  [facility]   [address], [place] [address] [phone] --  [facility]   [address] [place] [address] [phone] --  [facility]   [address] [place] [address] [phone] --

## 2023-07-27 NOTE — ED Notes (Signed)
 Patient says the medicine caused her to be anxious and dizzy. Patient also says the hallway bed and activity also increases her anxiety.

## 2023-07-27 NOTE — ED Notes (Addendum)
 Got a call from Mia [place] at [facility] who said they would accept patient for treatment by Dr. Odette Fraction, report # [phone] pending a urinalysis without a UTI emailed to [email or web address]. Talked with Dr. Estell Harpin and will proceed.

## 2023-07-27 NOTE — ED Notes (Signed)
 [facility] called at this time they are currently on a call will come here after they clear. Dawn Kelley

## 2023-07-30 NOTE — Telephone Encounter (Signed)
 Spoke to [employer] per signed [employer]. He verbalized understanding.

## 2023-08-22 DIAGNOSIS — E038 Other specified hypothyroidism: Secondary | ICD-10-CM | POA: Diagnosis not present

## 2023-08-22 DIAGNOSIS — S3992XA Unspecified injury of lower back, initial encounter: Secondary | ICD-10-CM | POA: Diagnosis not present

## 2023-08-22 DIAGNOSIS — I10 Essential (primary) hypertension: Secondary | ICD-10-CM | POA: Diagnosis not present

## 2023-08-22 DIAGNOSIS — E7849 Other hyperlipidemia: Secondary | ICD-10-CM | POA: Diagnosis not present

## 2023-08-22 DIAGNOSIS — Z0000 Encounter for general adult medical examination without abnormal findings: Secondary | ICD-10-CM | POA: Diagnosis not present

## 2023-08-22 DIAGNOSIS — I700 Atherosclerosis of aorta: Secondary | ICD-10-CM | POA: Diagnosis not present

## 2023-08-26 ENCOUNTER — Ambulatory Visit: Admitting: Obstetrics & Gynecology

## 2023-08-26 ENCOUNTER — Encounter: Payer: Self-pay | Admitting: Obstetrics & Gynecology

## 2023-08-26 ENCOUNTER — Other Ambulatory Visit (HOSPITAL_COMMUNITY)
Admission: RE | Admit: 2023-08-26 | Discharge: 2023-08-26 | Disposition: A | Discharge status: Home / Self Care | Source: Ambulatory Visit | Attending: Obstetrics & Gynecology | Admitting: Obstetrics & Gynecology

## 2023-08-26 VITALS — BP 138/86 | HR 84

## 2023-08-26 DIAGNOSIS — Z01419 Encounter for gynecological examination (general) (routine) without abnormal findings: Secondary | ICD-10-CM | POA: Insufficient documentation

## 2023-08-26 DIAGNOSIS — F419 Anxiety disorder, unspecified: Secondary | ICD-10-CM

## 2023-08-26 DIAGNOSIS — Z1151 Encounter for screening for human papillomavirus (HPV): Secondary | ICD-10-CM | POA: Insufficient documentation

## 2023-08-26 DIAGNOSIS — Z124 Encounter for screening for malignant neoplasm of cervix: Secondary | ICD-10-CM

## 2023-08-26 NOTE — Progress Notes (Signed)
 Subjective:     Dawn Kelley is a 63 y.o. female here for a routine exam.  No LMP recorded. Patient is postmenopausal. [id number] Birth Control Method:  menopausal Menstrual Calendar(currently): amenorrheic  Current complaints: back ache.   Current acute medical issues:  generalized anxiety disorder   Recent Gynecologic History No LMP recorded. Patient is postmenopausal. Last Pap: 2022,  ASCUS Last mammogram: 10/22,  normal  Past Medical History:  Diagnosis Date   Anxiety    Bipolar 1 disorder (HCC)    Depression    OCD (obsessive compulsive disorder)    PTSD (post-traumatic stress disorder)    S/P ECT (electroconvulsive therapy)     Past Surgical History:  Procedure Laterality Date   BIOPSY  02/03/2020   Procedure: BIOPSY;  Surgeon: Urban Garden, MD;  [facility];  Service: Gastroenterology;;   COLONOSCOPY WITH PROPOFOL  N/A 02/03/2020   [name]: 3mm polyp in sigmoid colon  as well as hemorrhoids, histology consistent with tubular adenoma   ESOPHAGOGASTRODUODENOSCOPY (EGD) WITH PROPOFOL  N/A 02/03/2020   castaneda: small gastric polyps, sessile, and fundic gland, small bowel bx normal   INDUCED ABORTION N/A 1980   POLYPECTOMY  02/03/2020   Procedure: POLYPECTOMY;  Surgeon: Urban Garden, MD;  [facility];  Service: Gastroenterology;;   VEIN SURGERY      OB History     Gravida  4   Para  3   Term  3   Preterm  0   AB  1   Living  3      SAB      IAB      Ectopic      Multiple      Live Births  3           Social History   Socioeconomic History   Marital status: Married    Spouse name: Not on file   Number of children: Not on file   Years of education: Not on file   Highest education level: Not on file  Occupational History   Occupation: Disabled  Tobacco Use   Smoking status: Never    Passive exposure: Current   Smokeless tobacco: Never  Vaping Use   Vaping status: Never Used   Substance and Sexual Activity   Alcohol use: Not Currently    Alcohol/week: 10.0 standard drinks of alcohol    Types: 10 Cans of beer per week    Comment: daily   Drug use: Yes    Types: Marijuana    Comment: occassional   Sexual activity: Not Currently    Birth control/protection: Post-menopausal  Other Topics Concern   Not on file  Social History Narrative   Pt lives in [place] with husband.  She is on disability.  Pt stated that she receives outpatient [facility]ry [facility].  She does not receive outpatient therapy.   Social Drivers of Corporate investment banker Strain: Low Risk  (10/23/2022)   Overall Financial Resource Strain (CARDIA)    Difficulty of Paying Living Expenses: Not hard at all  Food Insecurity: No Food Insecurity (10/23/2022)   Hunger Vital Sign    Worried About Running Out of Food in the Last Year: Never true    Ran Out of Food in the Last Year: Never true  Transportation Needs: No Transportation Needs (10/23/2022)   PRAPARE - Administrator, Civil Service (Medical): No    Lack of Transportation (Non-Medical): No  Physical Activity: Insufficiently Active (  10/23/2022)   Exercise Vital Sign    Days of Exercise per Week: 2 days    Minutes of Exercise per Session: 10 min  Stress: No Stress Concern Present (10/23/2022)   [employer] of Occupational Health - Occupational Stress Questionnaire    Feeling of Stress : Not at all  Social Connections: Moderately Isolated (10/23/2022)   Social Connection and Isolation Panel [NHANES]    Frequency of Communication with Friends and Family: More than three times a week    Frequency of Social Gatherings with Friends and Family: More than three times a week    Attends Religious Services: Never    Database administrator or Organizations: No    Attends Engineer, structural: Never    Marital Status: Married    Family History  Problem Relation Age of Onset   Depression  Mother    Pneumonia Mother      Current Outpatient Medications:    busPIRone  (BUSPAR ) 10 MG tablet, Take 10 mg by mouth 3 (three) times daily., Disp: , Rfl:    DULoxetine (CYMBALTA) 60 MG capsule, Take 60 mg by mouth daily., Disp: , Rfl:    gabapentin  (NEURONTIN ) 300 MG capsule, Take 300 mg by mouth 3 (three) times daily., Disp: , Rfl:    levothyroxine  (SYNTHROID ) 25 MCG tablet, Take 25 mcg by mouth daily before breakfast., Disp: , Rfl:    losartan (COZAAR) 25 MG tablet, Take 25 mg by mouth daily., Disp: , Rfl:    propranolol  (INDERAL ) 20 MG tablet, Take 20 mg by mouth 3 (three) times daily., Disp: , Rfl:    rosuvastatin  (CRESTOR ) 20 MG tablet, Take 20 mg by mouth daily., Disp: , Rfl:    tamsulosin (FLOMAX) 0.4 MG CAPS capsule, Take 0.4 mg by mouth., Disp: , Rfl:    zolpidem (AMBIEN) 10 MG tablet, Take 10 mg by mouth at bedtime as needed for sleep., Disp: , Rfl:    busPIRone  (BUSPAR ) 15 MG tablet, Take 15 mg by mouth 3 (three) times daily., Disp: , Rfl:    ibuprofen (ADVIL) 600 MG tablet, Take 600 mg by mouth every 6 (six) hours as needed for mild pain (pain score 1-3) or moderate pain (pain score 4-6). (Patient not taking: Reported on 08/26/2023), Disp: , Rfl:    levothyroxine  (SYNTHROID ) 50 MCG tablet, Take 50 mcg by mouth daily., Disp: , Rfl:    LORazepam  (ATIVAN ) 1 MG tablet, Take 1 tablet (1 mg total) by mouth 3 (three) times daily as needed for anxiety., Disp: 15 tablet, Rfl: 0   naltrexone  (DEPADE) 50 MG tablet, Take 50 mg by mouth daily. (Patient not taking: Reported on 08/26/2023), Disp: , Rfl:    topiramate  (TOPAMAX ) 25 MG tablet, Take 25 mg by mouth 2 (two) times daily. (Patient not taking: Reported on 08/26/2023), Disp: , Rfl:    Vilazodone HCl (VIIBRYD) 40 MG TABS, Take 40 mg by mouth daily. (Patient not taking: Reported on 08/26/2023), Disp: , Rfl:   Review of Systems  Review of Systems  Constitutional: Negative for fever, chills, weight loss, malaise/fatigue and diaphoresis.   HENT: Negative for hearing loss, ear pain, nosebleeds, congestion, sore throat, neck pain, tinnitus and ear discharge.   Eyes: Negative for blurred vision, double vision, photophobia, pain, discharge and redness.  Respiratory: Negative for cough, hemoptysis, sputum production, shortness of breath, wheezing and stridor.   Cardiovascular: Negative for chest pain, palpitations, orthopnea, claudication, leg swelling and PND.  Gastrointestinal: negative for abdominal pain. Negative for heartburn, nausea,  vomiting, diarrhea, constipation, blood in stool and melena.  Genitourinary: Negative for dysuria, urgency, frequency, hematuria and flank pain.  Musculoskeletal: Negative for myalgias, back pain, joint pain and falls.  Skin: Negative for itching and rash.  Neurological: Negative for dizziness, tingling, tremors, sensory change, speech change, focal weakness, seizures, loss of consciousness, weakness and headaches.  Endo/Heme/Allergies: Negative for environmental allergies and polydipsia. Does not bruise/bleed easily.  Psychiatric/Behavioral: Negative for depression, suicidal ideas, hallucinations, memory loss and substance abuse. The patient is not nervous/anxious and does not have insomnia.        Objective:  Blood pressure 138/86, pulse 84.   Physical Exam  Vitals reviewed. Constitutional: She is oriented to person, place, and time. She appears well-developed and well-nourished.  HENT:  Head: Normocephalic and atraumatic.        Right Ear: External ear normal.  Left Ear: External ear normal.  Nose: Nose normal.  Mouth/Throat: Oropharynx is clear and moist.  Eyes: Conjunctivae and EOM are normal. Pupils are equal, round, and reactive to light. Right eye exhibits no discharge. Left eye exhibits no discharge. No scleral icterus.  Neck: Normal range of motion. Neck supple. No tracheal deviation present. No thyromegaly present.  Cardiovascular: Normal rate, regular rhythm, normal heart sounds  and intact distal pulses.  Exam reveals no gallop and no friction rub.   No murmur heard. Respiratory: Effort normal and breath sounds normal. No respiratory distress. She has no wheezes. She has no rales. She exhibits no tenderness.  GI: Soft. Bowel sounds are normal. She exhibits no distension and no mass. There is no tenderness. There is no rebound and no guarding.  Genitourinary:  Breasts no masses skin changes or nipple changes bilaterally      Vulva is normal without lesions Vagina is pink moist without discharge Cervix normal in appearance and pap is done Uterus is normal size shape and contour Adnexa is negative with normal sized ovaries   Musculoskeletal: Normal range of motion. She exhibits no edema and no tenderness.  Neurological: She is alert and oriented to person, place, and time. She has normal reflexes. She displays normal reflexes. No cranial nerve deficit. She exhibits normal muscle tone. Coordination normal.  Skin: Skin is warm and dry. No rash noted. No erythema. No pallor.  Psychiatric: She has a normal mood and affect. Her behavior is normal. Judgment and thought content normal.       Medications Ordered at today's visit: No orders of the defined types were placed in this encounter.   Other orders placed at today's visit: No orders of the defined types were placed in this encounter.    ASSESSMENT + PLAN:    ICD-10-CM   1. Well woman exam with routine gynecological exam  Z01.419     2. Routine Papanicolaou smear  Z12.4 Cytology - PAP( [place])    3. Severe anxiety  F41.9           No follow-ups on file.

## 2023-08-28 ENCOUNTER — Other Ambulatory Visit: Payer: Self-pay

## 2023-08-28 ENCOUNTER — Emergency Department (HOSPITAL_COMMUNITY)
Admission: EM | Admit: 2023-08-28 | Discharge: 2023-08-28 | Disposition: A | Discharge status: Home / Self Care | Attending: Emergency Medicine | Admitting: Emergency Medicine

## 2023-08-28 ENCOUNTER — Encounter (HOSPITAL_COMMUNITY): Payer: Self-pay | Admitting: Emergency Medicine

## 2023-08-28 DIAGNOSIS — R6889 Other general symptoms and signs: Secondary | ICD-10-CM | POA: Diagnosis not present

## 2023-08-28 DIAGNOSIS — T50904A Poisoning by unspecified drugs, medicaments and biological substances, undetermined, initial encounter: Secondary | ICD-10-CM | POA: Diagnosis not present

## 2023-08-28 DIAGNOSIS — Z743 Need for continuous supervision: Secondary | ICD-10-CM | POA: Diagnosis not present

## 2023-08-28 DIAGNOSIS — Z79899 Other long term (current) drug therapy: Secondary | ICD-10-CM | POA: Insufficient documentation

## 2023-08-28 DIAGNOSIS — F410 Panic disorder [episodic paroxysmal anxiety] without agoraphobia: Secondary | ICD-10-CM | POA: Insufficient documentation

## 2023-08-28 DIAGNOSIS — N3000 Acute cystitis without hematuria: Secondary | ICD-10-CM | POA: Diagnosis not present

## 2023-08-28 DIAGNOSIS — F419 Anxiety disorder, unspecified: Secondary | ICD-10-CM | POA: Insufficient documentation

## 2023-08-28 DIAGNOSIS — I10 Essential (primary) hypertension: Secondary | ICD-10-CM | POA: Diagnosis not present

## 2023-08-28 LAB — CBC WITH DIFFERENTIAL/PLATELET
Abs Immature Granulocytes: 0.06 10*3/uL (ref 0.00–0.07)
Basophils Absolute: 0 10*3/uL (ref 0.0–0.1)
Basophils Relative: 0 %
Eosinophils Absolute: 0 10*3/uL (ref 0.0–0.5)
Eosinophils Relative: 0 %
HCT: 43.9 % (ref 36.0–46.0)
Hemoglobin: 14.9 g/dL (ref 12.0–15.0)
Immature Granulocytes: 0 %
Lymphocytes Relative: 14 %
Lymphs Abs: 2 10*3/uL (ref 0.7–4.0)
MCH: 31 pg (ref 26.0–34.0)
MCHC: 33.9 g/dL (ref 30.0–36.0)
MCV: 91.3 fL (ref 80.0–100.0)
Monocytes Absolute: 0.5 10*3/uL (ref 0.1–1.0)
Monocytes Relative: 4 %
Neutro Abs: 11.6 10*3/uL — ABNORMAL HIGH (ref 1.7–7.7)
Neutrophils Relative %: 82 %
Platelets: 412 10*3/uL — ABNORMAL HIGH (ref 150–400)
RBC: 4.81 MIL/uL (ref 3.87–5.11)
RDW: 12.5 % (ref 11.5–15.5)
WBC: 14.3 10*3/uL — ABNORMAL HIGH (ref 4.0–10.5)
nRBC: 0 % (ref 0.0–0.2)

## 2023-08-28 LAB — URINALYSIS, ROUTINE W REFLEX MICROSCOPIC
Bilirubin Urine: NEGATIVE
Glucose, UA: NEGATIVE mg/dL
Hgb urine dipstick: NEGATIVE
Ketones, ur: 5 mg/dL — AB
Nitrite: NEGATIVE
Protein, ur: 30 mg/dL — AB
Specific Gravity, Urine: 1.009 (ref 1.005–1.030)
Squamous Epithelial / HPF: >50 /HPF (ref 0–5)
pH: 7 (ref 5.0–8.0)

## 2023-08-28 LAB — COMPREHENSIVE METABOLIC PANEL WITH GFR
ALT: 21 U/L (ref 0–44)
AST: 32 U/L (ref 15–41)
Albumin: 3.8 g/dL (ref 3.5–5.0)
Alkaline Phosphatase: 97 U/L (ref 38–126)
Anion gap: 16 — ABNORMAL HIGH (ref 5–15)
BUN: 6 mg/dL — ABNORMAL LOW (ref 8–23)
CO2: 20 mmol/L — ABNORMAL LOW (ref 22–32)
Calcium: 9.3 mg/dL (ref 8.9–10.3)
Chloride: 99 mmol/L (ref 98–111)
Creatinine, Ser: 0.5 mg/dL (ref 0.44–1.00)
GFR, Estimated: >60 mL/min (ref >60)
Glucose, Bld: 134 mg/dL — ABNORMAL HIGH (ref 70–99)
Potassium: 3.4 mmol/L — ABNORMAL LOW (ref 3.5–5.1)
Sodium: 135 mmol/L (ref 135–145)
Total Bilirubin: 0.5 mg/dL (ref 0.0–1.2)
Total Protein: 7.8 g/dL (ref 6.5–8.1)

## 2023-08-28 LAB — RAPID URINE DRUG SCREEN, HOSP PERFORMED
Amphetamines: NOT DETECTED
Barbiturates: NOT DETECTED
Benzodiazepines: POSITIVE — AB
Cocaine: NOT DETECTED
Opiates: NOT DETECTED
Tetrahydrocannabinol: POSITIVE — AB

## 2023-08-28 LAB — SALICYLATE LEVEL: Salicylate Lvl: <7 mg/dL — ABNORMAL LOW (ref 7.0–30.0)

## 2023-08-28 LAB — MAGNESIUM: Magnesium: 2 mg/dL (ref 1.7–2.4)

## 2023-08-28 LAB — ACETAMINOPHEN LEVEL: Acetaminophen (Tylenol), Serum: 17 ug/mL (ref 10–30)

## 2023-08-28 LAB — CBG MONITORING, ED: Glucose-Capillary: 137 mg/dL — ABNORMAL HIGH (ref 70–99)

## 2023-08-28 LAB — TSH: TSH: 3.086 u[IU]/mL (ref 0.350–4.500)

## 2023-08-28 LAB — ETHANOL: Alcohol, Ethyl (B): <10 mg/dL (ref <10)

## 2023-08-28 MED ORDER — LORAZEPAM 1 MG PO TABS: 1.0000 mg | ORAL_TABLET | Freq: Three times a day (TID) | ORAL | 0 refills | Status: DC | PRN

## 2023-08-28 MED ORDER — POTASSIUM CHLORIDE CRYS ER 20 MEQ PO TBCR
40.0000 meq | EXTENDED_RELEASE_TABLET | Freq: Once | ORAL | Status: AC
  Administered 2023-08-28: 40 meq via ORAL
  Filled 2023-08-28: qty 2

## 2023-08-28 MED ORDER — LORAZEPAM 1 MG PO TABS
2.0000 mg | ORAL_TABLET | Freq: Once | ORAL | Status: AC
  Administered 2023-08-28: 2 mg via ORAL
  Filled 2023-08-28: qty 2

## 2023-08-28 MED ORDER — CEPHALEXIN 500 MG PO CAPS: 500.0000 mg | ORAL_CAPSULE | Freq: Two times a day (BID) | ORAL | 0 refills | Status: AC

## 2023-08-28 NOTE — ED Provider Notes (Signed)
 [place] EMERGENCY DEPARTMENT AT [facility] Provider Note   CSN: [id number] Arrival date & time: 08/28/23  0754     History  Chief Complaint  Patient presents with   Anxiety    Dawn Kelley is a 63 y.o. female.  63 year old female with history of anxiety, bipolar, depression, and PTSD who presents emergency department with anxiety.  Patient reports that for the past 4 days she has had some nausea.  Says that yesterday she smokes marijuana and drank 2 tall boys of beer.  Says that she started becoming very anxious 4 days ago as well.  Says that she has been taking her medications as prescribed which include buspirone, duloxetine, gabapentin, hydroxyzine, levothyroxine, and zolpidem.  Is worried about interactions between all the medications possibly causing some harmful effects.  Not worried about poisoning is otherwise.  Has not overdosed or stopped any of her medications from which she tells me.  Did confirm this with her husband.  Denies any SI, HI, AVH.       Home Medications Prior to Admission medications   Medication Sig Start Date End Date Taking? Authorizing Provider  busPIRone (BUSPAR) 15 MG tablet Take 15 mg by mouth 3 (three) times daily. 08/16/23  Yes [provider]  cephALEXin (KEFLEX) 500 MG capsule Take 1 capsule (500 mg total) by mouth 2 (two) times daily for 7 days. 08/28/23 09/04/23 Yes Rondel Baton, MD  levothyroxine (SYNTHROID) 50 MCG tablet Take 50 mcg by mouth daily. 08/16/23  Yes [provider]  LORazepam (ATIVAN) 1 MG tablet Take 1 tablet (1 mg total) by mouth 3 (three) times daily as needed for anxiety. 08/28/23  Yes Rondel Baton, MD  busPIRone (BUSPAR) 10 MG tablet Take 10 mg by mouth 3 (three) times daily. 07/03/23   [provider]  DULoxetine (CYMBALTA) 60 MG capsule Take 60 mg by mouth daily.    [provider]  gabapentin (NEURONTIN) 300 MG capsule Take 300 mg by mouth 3 (three) times daily.     [provider]  ibuprofen (ADVIL) 600 MG tablet Take 600 mg by mouth every 6 (six) hours as needed for mild pain (pain score 1-3) or moderate pain (pain score 4-6). Patient not taking: Reported on 08/26/2023    [provider]  levothyroxine (SYNTHROID) 25 MCG tablet Take 25 mcg by mouth daily before breakfast.    [provider]  losartan (COZAAR) 25 MG tablet Take 25 mg by mouth daily.    [provider]  naltrexone (DEPADE) 50 MG tablet Take 50 mg by mouth daily. Patient not taking: Reported on 08/26/2023    [provider]  propranolol (INDERAL) 20 MG tablet Take 20 mg by mouth 3 (three) times daily. 03/29/20   [provider]  rosuvastatin (CRESTOR) 20 MG tablet Take 20 mg by mouth daily. 10/15/22   [provider]  tamsulosin (FLOMAX) 0.4 MG CAPS capsule Take 0.4 mg by mouth.    [provider]  topiramate (TOPAMAX) 25 MG tablet Take 25 mg by mouth 2 (two) times daily. Patient not taking: Reported on 08/26/2023 09/10/22   [provider]  Vilazodone HCl (VIIBRYD) 40 MG TABS Take 40 mg by mouth daily. Patient not taking: Reported on 08/26/2023    [provider]  zolpidem (AMBIEN) 10 MG tablet Take 10 mg by mouth at bedtime as needed for sleep.    [provider]      Allergies    Escitalopram, Seroquel [quetiapine  fumarate], and Trazodone    Review of Systems   Review of Systems  Physical Exam Updated Vital Signs BP (!) 140/66 (BP Location: Right Arm)   Pulse 95   Temp 98.8 F (37.1 C) (Oral)   Resp (!) 21   SpO2 94%  Physical Exam Vitals and nursing note reviewed.  Constitutional:      General: She is not in acute distress.    Appearance: She is well-developed.  HENT:     Head: Normocephalic and atraumatic.     Right Ear: External ear normal.     Left Ear: External ear normal.     Nose: Nose normal.  Eyes:     Extraocular Movements: Extraocular movements intact.      Conjunctiva/sclera: Conjunctivae normal.     Pupils: Pupils are equal, round, and reactive to light.  Cardiovascular:     Rate and Rhythm: Normal rate and regular rhythm.     Heart sounds: No murmur heard. Pulmonary:     Effort: Pulmonary effort is normal. No respiratory distress.     Breath sounds: Normal breath sounds.  Abdominal:     General: Abdomen is flat. There is no distension.     Palpations: Abdomen is soft. There is no mass.     Tenderness: There is no abdominal tenderness. There is no guarding.  Musculoskeletal:     Cervical back: Normal range of motion and neck supple.     Right lower leg: No edema.     Left lower leg: No edema.  Skin:    General: Skin is warm and dry.  Neurological:     Mental Status: She is alert and oriented to person, place, and time. Mental status is at baseline.     Comments: Mild tremor from anxiety.  No clonus.  Patellar reflexes 2+ bilaterally.  Psychiatric:     Comments: Very anxious appearing     ED Results / Procedures / Treatments   Labs (all labs ordered are listed, but only abnormal results are displayed) Labs Reviewed  COMPREHENSIVE METABOLIC PANEL WITH GFR - Abnormal; Notable for the following components:      Result Value   Potassium 3.4 (*)    CO2 20 (*)    Glucose, Bld 134 (*)    BUN 6 (*)    Anion gap 16 (*)    All other components within normal limits  RAPID URINE DRUG SCREEN, HOSP PERFORMED - Abnormal; Notable for the following components:   Benzodiazepines POSITIVE (*)    Tetrahydrocannabinol POSITIVE (*)    All other components within normal limits  CBC WITH DIFFERENTIAL/PLATELET - Abnormal; Notable for the following components:   WBC 14.3 (*)    Platelets 412 (*)    Neutro Abs 11.6 (*)    All other components within normal limits  SALICYLATE LEVEL - Abnormal; Notable for the following components:   Salicylate Lvl <7.0 (*)    All other components within normal limits  URINALYSIS, ROUTINE W REFLEX MICROSCOPIC -  Abnormal; Notable for the following components:   APPearance CLOUDY (*)    Ketones, ur 5 (*)    Protein, ur 30 (*)    Leukocytes,Ua SMALL (*)    Bacteria, UA MANY (*)    Non Squamous Epithelial 0-5 (*)    All other components within normal limits  CBG MONITORING, ED - Abnormal; Notable for the following components:   Glucose-Capillary 137 (*)    All other components within normal limits  ETHANOL  ACETAMINOPHEN LEVEL  TSH  MAGNESIUM  T4, FREE    EKG EKG Interpretation Date/Time:  Wednesday August 28 2023 08:51:42 EDT Ventricular Rate:  50 PR Interval:  115 QRS Duration:  89 QT Interval:  563 QTC Calculation: 514 R Axis:   17  Text Interpretation: Sinus rhythm Borderline short PR interval Probable anterior infarct, age indeterminate Prolonged QT interval Confirmed by Vonita Moss [phone]) on 08/28/2023 9:25:16 AM  Radiology No results found.  Procedures Procedures    Medications Ordered in ED Medications  LORazepam (ATIVAN) tablet 2 mg (2 mg Oral Given 08/28/23 0830)  potassium chloride SA (KLOR-CON M) CR tablet 40 mEq (40 mEq Oral Given 08/28/23 1059)    ED Course/ Medical Decision Making/ A&P                                  Medical Decision Making Amount and/or Complexity of Data Reviewed Labs: ordered.  Risk Prescription drug management.   Dawn Kelley is a 63 y.o. female with comorbidities that complicate the patient evaluation including anxiety, bipolar, depression, and PTSD who presents emergency department with anxiety.   Initial Ddx:  Panic attack, anxiety, cannabis misuse, alcohol withdrawal, polysubstance abuse, serotonin syndrome  MDM/Course:  Patient presents to the emergency department with anxiety.  Reports that she has been taking her medication as prescribed.  No extra doses.  Later told staff that she also had been having some dysuria and frequency.  Denies any SI, HI, or AVH.  On arrival was very anxious.  No rigidity, hyperreflexia, or  clonus to suggest serotonin syndrome.  She had lab work that showed that she had a potassium of 3.4 which was replenished.  Was positive for benzodiazepine and THC in her urine drug screen.  Thyroid test normal.  Urinalysis appears to be consistent with UTI.  Upon re-evaluation was feeling much better after some oral Ativan.  Her EKG did show that she has a prolonged QT of 536 MS.  Magnesium WNL.  Is on several QT prolonging agents which were discontinued here in the emergency department.  She was given Ativan for panic attacks and anxiety as needed.  Counseled to follow-up with her primary doctor and psychology about long-term agents for her anxiety that are not QT prolonging.  Suspect that her THC use may have played a role in today's presentation as well so was counseled to stop marijuana use.  This patient presents to the ED for concern of complaints listed in HPI, this involves an extensive number of treatment options, and is a complaint that carries with it a high risk of complications and morbidity. Disposition including potential need for admission considered.   Dispo: DC Home. Return precautions discussed including, but not limited to, those listed in the AVS. Allowed pt time to ask questions which were answered fully prior to dc.  Additional history obtained from spouse Records reviewed Outpatient Clinic Notes The following labs were independently interpreted: Chemistry and show  mild hypokalemia I personally reviewed and interpreted cardiac monitoring: normal sinus rhythm  I personally reviewed and interpreted the pt's EKG: see above for interpretation  I have reviewed the patients home medications and made adjustments as needed  Portions of this note were generated with Dragon dictation software. Dictation errors may occur despite best attempts at proofreading.     Final Clinical Impression(s) / ED Diagnoses Final diagnoses:  Anxiety  Panic attack  Acute cystitis without hematuria  Rx / DC Orders ED Discharge Orders          Ordered    cephALEXin (KEFLEX) 500 MG capsule  2 times daily        08/28/23 1013    LORazepam (ATIVAN) 1 MG tablet  3 times daily PRN        08/28/23 1034              Ninetta Basket, MD 08/28/23 1115

## 2023-08-28 NOTE — ED Triage Notes (Signed)
 Pt bib ems. Pt c/o anxiety. EMS reports she stated she is being poisoned by her medications. PT also stated she has not taken any of her medications. She stated she was in the hospital in [place] for 17 days in behavioral health

## 2023-08-28 NOTE — Discharge Instructions (Addendum)
 You were seen for your anxiety in the emergency department.   At home, please take the Ativan as prescribed when you are feeling anxious.  Please stop the medications listed on this packet because of your prolonged QT (563 MS).  Stop drinking alcohol and using marijuana.  These can worsen your anxiety.  Check your MyChart online for the results of any tests that had not resulted by the time you left the emergency department.   Follow-up with your primary doctor in 2-3 days regarding your visit.  Follow-up with your psychiatrist soon as possible.  Return immediately to the emergency department if you experience any of the following: Thoughts of self-harm, thoughts of harming others, hallucinations, or any other concerning symptoms.    Thank you for visiting our Emergency Department. It was a pleasure taking care of you today.

## 2023-08-29 ENCOUNTER — Telehealth: Payer: Self-pay

## 2023-08-29 NOTE — Transitions of Care (Post Inpatient/ED Visit) (Signed)
 08/29/2023  Name: Dawn Kelley MRN: [id number] DOB: [birth year]  Today's TOC FU Call Status: Today's TOC FU Call Status:: Successful TOC FU Call Completed TOC FU Call Complete Date: 08/29/23 Patient's Name and Date of Birth confirmed.  Transition Care Management Follow-up Telephone Call Date of Discharge: 08/28/23 [facility] Type of Discharge: Emergency Department Reason for ED Visit: Other: (cystitis) How have you been since you were released from the hospital?: Same Any questions or concerns?: No  Items Reviewed: Did you receive and understand the discharge instructions provided?: Yes Medications obtained,verified, and reconciled?: Yes (Medications Reviewed) Any new allergies since your discharge?: No Dietary orders reviewed?: Yes  Medications Reviewed Today: Medications Reviewed Today     Reviewed by Karena Addison, LPN (Licensed Practical Nurse) on 08/29/23 at 1534  Med List Status: <None>   Medication Order Taking? Sig Documenting Provider Last Dose Status Informant  busPIRone (BUSPAR) 10 MG tablet [id number] No Take 10 mg by mouth 3 (three) times daily. [provider] Taking Active Self, Pharmacy Records  busPIRone (BUSPAR) 15 MG tablet [id number]  Take 15 mg by mouth 3 (three) times daily. [provider]  Active   cephALEXin (KEFLEX) 500 MG capsule [id number]  Take 1 capsule (500 mg total) by mouth 2 (two) times daily for 7 days. Rondel Baton, MD  Active   DULoxetine (CYMBALTA) 60 MG capsule [id number] No Take 60 mg by mouth daily. [provider] Taking Active   gabapentin (NEURONTIN) 300 MG capsule [id number] No Take 300 mg by mouth 3 (three) times daily. [provider] Taking Active   ibuprofen (ADVIL) 600 MG tablet [id number] No Take 600 mg by mouth every 6 (six) hours as needed for mild pain (pain score 1-3) or moderate pain (pain score 4-6).  Patient not taking: Reported on 08/26/2023   [provider] Not Taking Active Self, Pharmacy Records  levothyroxine (SYNTHROID) 25 MCG tablet [id number] No Take 25 mcg by mouth daily before breakfast. [provider] Taking Active Self, Pharmacy Records  levothyroxine (SYNTHROID) 50 MCG tablet [id number]  Take 50 mcg by mouth daily. [provider]  Active   LORazepam (ATIVAN) 1 MG tablet [id number]  Take 1 tablet (1 mg total) by mouth 3 (three) times daily as needed for anxiety. Rondel Baton, MD  Active   losartan (COZAAR) 25 MG tablet [id number] No Take 25 mg by mouth daily. [provider] Taking Active Self, Pharmacy Records  naltrexone (DEPADE) 50 MG tablet [id number] No Take 50 mg by mouth daily.  Patient not taking: Reported on 08/26/2023   [provider] Not Taking Active Self, Pharmacy Records  propranolol (INDERAL) 20 MG tablet [id number] No Take 20 mg by mouth 3 (three) times daily. [provider] Taking Active Self, Pharmacy Records           Med Note Peggye Pitt, [facility] Geryl Councilman Mar 26, 2022  2:08 PM)    rosuvastatin (CRESTOR) 20 MG tablet [id number] No Take 20 mg by mouth daily. [provider] Taking Active Self, Pharmacy Records  tamsulosin (FLOMAX) 0.4 MG CAPS capsule [id number] No Take 0.4 mg by mouth. [provider] Taking Active   topiramate (TOPAMAX) 25 MG tablet [id number] No Take 25 mg by mouth 2 (two) times daily.  Patient not taking: Reported on 08/26/2023   [provider] Not Taking Active Self, Pharmacy Records  Vilazodone HCl (VIIBRYD) 40 MG TABS [id number] No Take 40 mg by mouth daily.  Patient not  taking: Reported on 08/26/2023   [provider] Not Taking Active Self, Pharmacy Records  zolpidem (AMBIEN) 10 MG tablet [id number] No Take 10 mg by mouth at bedtime as needed for sleep. [provider] Taking Active             Home Care and Equipment/Supplies: Were Home Health Services Ordered?: NA Any new equipment or  medical supplies ordered?: NA  Functional Questionnaire: Do you need assistance with bathing/showering or dressing?: No Do you need assistance with meal preparation?: No Do you need assistance with eating?: No Do you have difficulty maintaining continence: No Do you need assistance with getting out of bed/getting out of a chair/moving?: No Do you have difficulty managing or taking your medications?: No  Follow up appointments reviewed: PCP Follow-up appointment confirmed?: Yes Date of PCP follow-up appointment?: 09/06/23 Follow-up Provider: [facility] Follow-up appointment confirmed?: NA Do you need transportation to your follow-up appointment?: No Do you understand care options if your condition(s) worsen?: Yes-patient verbalized understanding    SIGNATURE Darrall Ellison, LPN [facility] Nurse Health Advisor Direct Dial [phone]

## 2023-08-30 LAB — CYTOLOGY - PAP
Adequacy: ABSENT
Comment: NEGATIVE
Diagnosis: NEGATIVE
High risk HPV: NEGATIVE

## 2023-09-05 DIAGNOSIS — R197 Diarrhea, unspecified: Secondary | ICD-10-CM | POA: Diagnosis not present

## 2023-09-06 ENCOUNTER — Encounter: Payer: Self-pay | Admitting: Family Medicine

## 2023-09-06 ENCOUNTER — Inpatient Hospital Stay: Admitting: Family Medicine

## 2023-09-11 ENCOUNTER — Encounter (HOSPITAL_COMMUNITY): Payer: Self-pay

## 2023-09-11 ENCOUNTER — Emergency Department (HOSPITAL_COMMUNITY)
Admission: EM | Admit: 2023-09-11 | Discharge: 2023-09-12 | Disposition: A | Discharge status: Home / Self Care | Attending: Emergency Medicine | Admitting: Emergency Medicine

## 2023-09-11 ENCOUNTER — Other Ambulatory Visit: Payer: Self-pay

## 2023-09-11 DIAGNOSIS — R6889 Other general symptoms and signs: Secondary | ICD-10-CM | POA: Diagnosis not present

## 2023-09-11 DIAGNOSIS — R231 Pallor: Secondary | ICD-10-CM | POA: Diagnosis not present

## 2023-09-11 DIAGNOSIS — R0689 Other abnormalities of breathing: Secondary | ICD-10-CM | POA: Diagnosis not present

## 2023-09-11 DIAGNOSIS — F419 Anxiety disorder, unspecified: Secondary | ICD-10-CM | POA: Insufficient documentation

## 2023-09-11 DIAGNOSIS — Z743 Need for continuous supervision: Secondary | ICD-10-CM | POA: Diagnosis not present

## 2023-09-11 MED ORDER — LORAZEPAM 1 MG PO TABS
2.0000 mg | ORAL_TABLET | Freq: Once | ORAL | Status: AC
  Administered 2023-09-12: 2 mg via ORAL
  Filled 2023-09-11: qty 2

## 2023-09-11 NOTE — ED Triage Notes (Signed)
 Pt is coming from home. Pt is coming in for her chronic anxiety, tonight she decided to smoke weed to calm her nerves. The marijuana did not calm her nerves and she is more anxious, She did take Atarax  as well.   Medic vitals   136/70 84hr 100%ra 18rr 116bgl

## 2023-09-12 DIAGNOSIS — F419 Anxiety disorder, unspecified: Secondary | ICD-10-CM | POA: Diagnosis not present
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
## 2023-09-12 NOTE — ED Notes (Signed)
 Pt in her room sleeping, she has been up to use the bathroom with no assistance

## 2023-09-12 NOTE — Discharge Instructions (Signed)
 You were evaluated in the Emergency Department and after careful evaluation, we did not find any emergent condition requiring admission or further testing in the hospital.  Your exam/testing today is overall reassuring.  Symptoms likely due to anxiety.  Avoid marijuana, take your home medications, follow-up with your normal prescriber and therapist.  Please return to the Emergency Department if you experience any worsening of your condition.   Thank you for allowing us  to be a part of your care.

## 2023-09-15 ENCOUNTER — Encounter: Payer: Self-pay | Admitting: Obstetrics & Gynecology

## 2023-09-28 DIAGNOSIS — N3000 Acute cystitis without hematuria: Secondary | ICD-10-CM | POA: Diagnosis not present

## 2023-09-28 DIAGNOSIS — E785 Hyperlipidemia, unspecified: Secondary | ICD-10-CM | POA: Diagnosis not present

## 2023-09-28 DIAGNOSIS — Z91040 Latex allergy status: Secondary | ICD-10-CM | POA: Diagnosis not present

## 2023-09-28 DIAGNOSIS — I10 Essential (primary) hypertension: Secondary | ICD-10-CM | POA: Diagnosis not present

## 2023-09-28 DIAGNOSIS — E876 Hypokalemia: Secondary | ICD-10-CM | POA: Diagnosis not present

## 2023-09-28 DIAGNOSIS — Z79899 Other long term (current) drug therapy: Secondary | ICD-10-CM | POA: Diagnosis not present

## 2023-10-01 ENCOUNTER — Emergency Department (HOSPITAL_COMMUNITY)

## 2023-10-01 ENCOUNTER — Emergency Department (HOSPITAL_COMMUNITY)
Admission: EM | Admit: 2023-10-01 | Discharge: 2023-10-01 | Disposition: A | Discharge status: Home / Self Care | Attending: Emergency Medicine | Admitting: Emergency Medicine

## 2023-10-01 ENCOUNTER — Telehealth: Payer: Self-pay | Admitting: Family Medicine

## 2023-10-01 ENCOUNTER — Ambulatory Visit (INDEPENDENT_AMBULATORY_CARE_PROVIDER_SITE_OTHER)
Admission: EM | Admit: 2023-10-01 | Discharge: 2023-10-01 | Disposition: A | Discharge status: Other Acute Inpatient Hospital | Source: Home / Self Care

## 2023-10-01 ENCOUNTER — Encounter (HOSPITAL_COMMUNITY): Payer: Self-pay

## 2023-10-01 ENCOUNTER — Other Ambulatory Visit: Payer: Self-pay

## 2023-10-01 DIAGNOSIS — F410 Panic disorder [episodic paroxysmal anxiety] without agoraphobia: Secondary | ICD-10-CM | POA: Diagnosis not present

## 2023-10-01 DIAGNOSIS — F411 Generalized anxiety disorder: Secondary | ICD-10-CM | POA: Insufficient documentation

## 2023-10-01 DIAGNOSIS — M549 Dorsalgia, unspecified: Secondary | ICD-10-CM | POA: Insufficient documentation

## 2023-10-01 DIAGNOSIS — R61 Generalized hyperhidrosis: Secondary | ICD-10-CM | POA: Diagnosis not present

## 2023-10-01 DIAGNOSIS — I10 Essential (primary) hypertension: Secondary | ICD-10-CM | POA: Diagnosis not present

## 2023-10-01 DIAGNOSIS — R0689 Other abnormalities of breathing: Secondary | ICD-10-CM | POA: Diagnosis not present

## 2023-10-01 DIAGNOSIS — I517 Cardiomegaly: Secondary | ICD-10-CM | POA: Diagnosis not present

## 2023-10-01 DIAGNOSIS — R251 Tremor, unspecified: Secondary | ICD-10-CM | POA: Diagnosis not present

## 2023-10-01 DIAGNOSIS — R064 Hyperventilation: Secondary | ICD-10-CM | POA: Diagnosis not present

## 2023-10-01 DIAGNOSIS — G4700 Insomnia, unspecified: Secondary | ICD-10-CM | POA: Insufficient documentation

## 2023-10-01 DIAGNOSIS — F419 Anxiety disorder, unspecified: Secondary | ICD-10-CM | POA: Diagnosis present

## 2023-10-01 DIAGNOSIS — R0600 Dyspnea, unspecified: Secondary | ICD-10-CM | POA: Diagnosis not present

## 2023-10-01 DIAGNOSIS — R231 Pallor: Secondary | ICD-10-CM | POA: Diagnosis not present

## 2023-10-01 DIAGNOSIS — D72829 Elevated white blood cell count, unspecified: Secondary | ICD-10-CM | POA: Insufficient documentation

## 2023-10-01 DIAGNOSIS — G8929 Other chronic pain: Secondary | ICD-10-CM | POA: Diagnosis not present

## 2023-10-01 LAB — URINALYSIS, ROUTINE W REFLEX MICROSCOPIC
Bilirubin Urine: NEGATIVE
Glucose, UA: NEGATIVE mg/dL
Hgb urine dipstick: NEGATIVE
Ketones, ur: 20 mg/dL — AB
Leukocytes,Ua: NEGATIVE
Nitrite: NEGATIVE
Protein, ur: NEGATIVE mg/dL
Specific Gravity, Urine: 1.012 (ref 1.005–1.030)
pH: 6 (ref 5.0–8.0)

## 2023-10-01 LAB — CBC WITH DIFFERENTIAL/PLATELET
Abs Immature Granulocytes: 0.07 10*3/uL (ref 0.00–0.07)
Basophils Absolute: 0.1 10*3/uL (ref 0.0–0.1)
Basophils Relative: 0 %
Eosinophils Absolute: 0.1 10*3/uL (ref 0.0–0.5)
Eosinophils Relative: 1 %
HCT: 42.6 % (ref 36.0–46.0)
Hemoglobin: 14.1 g/dL (ref 12.0–15.0)
Immature Granulocytes: 1 %
Lymphocytes Relative: 16 %
Lymphs Abs: 2.3 10*3/uL (ref 0.7–4.0)
MCH: 29.4 pg (ref 26.0–34.0)
MCHC: 33.1 g/dL (ref 30.0–36.0)
MCV: 88.9 fL (ref 80.0–100.0)
Monocytes Absolute: 0.7 10*3/uL (ref 0.1–1.0)
Monocytes Relative: 5 %
Neutro Abs: 11 10*3/uL — ABNORMAL HIGH (ref 1.7–7.7)
Neutrophils Relative %: 77 %
Platelets: 392 10*3/uL (ref 150–400)
RBC: 4.79 MIL/uL (ref 3.87–5.11)
RDW: 12.1 % (ref 11.5–15.5)
WBC: 14.2 10*3/uL — ABNORMAL HIGH (ref 4.0–10.5)
nRBC: 0 % (ref 0.0–0.2)

## 2023-10-01 LAB — COMPREHENSIVE METABOLIC PANEL WITH GFR
ALT: 46 U/L — ABNORMAL HIGH (ref 0–44)
AST: 43 U/L — ABNORMAL HIGH (ref 15–41)
Albumin: 3.5 g/dL (ref 3.5–5.0)
Alkaline Phosphatase: 107 U/L (ref 38–126)
Anion gap: 13 (ref 5–15)
BUN: 14 mg/dL (ref 8–23)
CO2: 24 mmol/L (ref 22–32)
Calcium: 9.5 mg/dL (ref 8.9–10.3)
Chloride: 101 mmol/L (ref 98–111)
Creatinine, Ser: 0.65 mg/dL (ref 0.44–1.00)
GFR, Estimated: >60 mL/min (ref >60)
Glucose, Bld: 103 mg/dL — ABNORMAL HIGH (ref 70–99)
Potassium: 3.4 mmol/L — ABNORMAL LOW (ref 3.5–5.1)
Sodium: 138 mmol/L (ref 135–145)
Total Bilirubin: 0.5 mg/dL (ref 0.0–1.2)
Total Protein: 7 g/dL (ref 6.5–8.1)

## 2023-10-01 LAB — LIPASE, BLOOD: Lipase: 40 U/L (ref 11–51)

## 2023-10-01 MED ORDER — SODIUM CHLORIDE 0.9 % IV BOLUS
1000.0000 mL | Freq: Once | INTRAVENOUS | Status: AC
  Administered 2023-10-01: 1000 mL via INTRAVENOUS

## 2023-10-01 MED ORDER — LORAZEPAM 2 MG/ML IJ SOLN
1.0000 mg | Freq: Once | INTRAMUSCULAR | Status: AC
  Administered 2023-10-01: 1 mg via INTRAVENOUS
  Filled 2023-10-01: qty 1

## 2023-10-01 MED ORDER — LORAZEPAM 1 MG PO TABS
1.0000 mg | ORAL_TABLET | Freq: Once | ORAL | Status: AC
  Administered 2023-10-01: 1 mg via ORAL
  Filled 2023-10-01: qty 1

## 2023-10-01 NOTE — Progress Notes (Signed)
   10/01/23 1620  [facility] Triage Screening (Walk-ins at [facility] only)  How Did You Hear About Us ? Self  What Is the Reason for Your Visit/Call Today? Dawn Kelley presents to [name] voluntarily accompanied by her husband. Pt was unable to be triaged because she was having chest pains and sweating.  How Long Has This Been Causing You Problems?  (UTA)  Have You Recently Had Any Thoughts About Hurting Yourself?  (UTA)  Are You Planning to Commit Suicide/Harm Yourself At This time?  (UTA)  Have you Recently Had Thoughts About Hurting Someone Else?  (UTA)  Are You Planning To Harm Someone At This Time?  (UTA)  Physical Abuse  (UTA)  Verbal Abuse  (UTA)  Sexual Abuse  (UTA)  Exploitation of patient/patient's resources  (UTA)  Self-Neglect  (UTA)  Are you currently experiencing any auditory, visual or other hallucinations?  (UTA)  Have You Used Any Alcohol or Drugs in the Past 24 Hours?  (UTA)  Do you have any current medical co-morbidities that require immediate attention?  (UTA)  Clinician description of patient physical appearance/behavior: anxious  What Do You Feel Would Help You the Most Today?  (UTA)  If access to [facility] was not available, would you have sought care in the Emergency Department? Yes  Determination of Need Routine (7 days)  Options For Referral Medication Management;Outpatient Therapy

## 2023-10-01 NOTE — ED Provider Notes (Signed)
 Behavioral Health Urgent Care Medical Screening Exam  Patient Name: Dawn Kelley MRN: [id number] Date of Evaluation: 10/01/23 Chief Complaint:   Diagnosis:  Final diagnoses:  Anxiety state    History of Present illness: [name] is a 63 y.o. female.   Patient presents voluntarily complaining of increased anxiety leading to respiratory distress  and panic attacks. She is exhibits severe symptoms including difficulty breathing, insomnia, tremors, diaphoresis and excessive worrying.   Provider unable to complete a comprehensive exam . Patient is referred to Medical ED for medical clearance.   Flowsheet Row ED from 09/11/2023 in [facility] Emergency Department at [facility] ED from 08/28/2023 in [facility] Emergency Department at [facility] ED from 07/25/2023 in [facility] Emergency Department at [facility]  C-SSRS RISK CATEGORY No Risk No Risk High Risk       Psychiatric Specialty Exam  Presentation  General Appearance:Casual  Eye Contact:Fair  Speech:Clear and Coherent  Speech Volume:Normal  Handedness:No data recorded  Mood and Affect  Mood: Anxious; Hopeless  Affect: Tearful   Thought Process  Thought Processes: Coherent  Descriptions of Associations:Intact  Orientation:Full (Time, Place and Person)  Thought Content:WDL  Diagnosis of Schizophrenia or Schizoaffective disorder in past: No data recorded  Hallucinations:None  Ideas of Reference:None  Suicidal Thoughts:No  Homicidal Thoughts:No   Sensorium  Memory: Immediate Fair  Judgment: Fair  Insight: Fair   Art therapist  Concentration: Fair  Attention Span: Fair  Recall: [employer] of Knowledge: Fair  Language: Fair   Psychomotor Activity  Psychomotor Activity: Restlessness   Assets  Assets: Manufacturing systems engineer; Desire for Improvement   Sleep  Sleep: Poor  Number of hours:  0   Physical Exam: Physical  Exam Constitutional:      Appearance: Normal appearance.  HENT:     Head: Normocephalic and atraumatic.     Left Ear: Tympanic membrane normal.  Eyes:     Extraocular Movements: Extraocular movements intact.     Pupils: Pupils are equal, round, and reactive to light.  Musculoskeletal:        General: Normal range of motion.     Cervical back: Normal range of motion and neck supple.  Neurological:     General: No focal deficit present.     Mental Status: She is alert and oriented to person, place, and time.    Review of Systems  Constitutional: Negative.   HENT: Negative.    Eyes: Negative.   Respiratory: Negative.    Cardiovascular: Negative.   Gastrointestinal: Negative.   Genitourinary: Negative.   Musculoskeletal: Negative.   Skin: Negative.   Neurological: Negative.   Endo/Heme/Allergies: Negative.   Psychiatric/Behavioral:  The patient is nervous/anxious and has insomnia.    Blood pressure (!) 144/110, pulse 93, resp. rate (!) 30, SpO2 100%. There is no height or weight on file to calculate BMI.  Musculoskeletal: Strength & Muscle Tone: decreased Gait & Station: unsteady Patient leans: N/A   [facility] MSE Discharge Disposition for Follow up and Recommendations: Based on my evaluation the patient appears to have an emergency medical condition for which I recommend the patient be transferred to the emergency department for further evaluation.    Elston Halsted, NP 10/01/2023, 4:21 PM

## 2023-10-01 NOTE — ED Triage Notes (Addendum)
 Pt bib ems from [facility]; c/o anxiety; sent for evaluation of hyperventilation and anxiety; given 1 mg ativan  at [facility]; released ffrom psych hospital several days ago; ems reports pt taken of benzos which she has been on for years, adjusted multiple other meds; pt endorses worsening anxiety; currently being treated for uti; pt endorses thirst; pt endorsing chronic back pain; cbg 134; bp 126/78, 99% RA, P 90, rr 28

## 2023-10-01 NOTE — Telephone Encounter (Signed)
 Copied from CRM [id number]. Topic: Clinical - Medical Advice >> Oct 01, 2023  3:14 PM Dawn Kelley wrote: Reason for CRM: Patient called to cancel her appointment with Connell Degree because she is on the way to the hospital. Patient is going to [facility], and ask that her PCP call to check on her.

## 2023-10-01 NOTE — ED Provider Notes (Signed)
 [facility] EMERGENCY DEPARTMENT AT [facility] Provider Note   CSN: [id number] Arrival date & time: 10/01/23  1714     History  Chief Complaint  Patient presents with   Anxiety    Dawn Kelley is a 63 y.o. female.With a history of anxiety disorder and panic attacks who presents to the ED given concern for potential panic attack.  Patient was seen at behavioral health urgent care earlier today where she exhibited signs of difficulty breathing insomnia and anxiety she was sent here for further evaluation.  Fortunately she feels better since come to the ED and was able to calm down quite a bit.  Currently being treated for UTI.  Reports night sweats last night.  Reports chronic back pain and chronic diarrhea.  No new symptoms.  No nausea vomiting chest pain or shortness of breath.  Denies SI HI  HPI     Home Medications Prior to Admission medications   Medication Sig Start Date End Date Taking? Authorizing Provider  busPIRone  (BUSPAR ) 10 MG tablet Take 10 mg by mouth 3 (three) times daily. 07/03/23   [provider]  busPIRone  (BUSPAR ) 15 MG tablet Take 15 mg by mouth 3 (three) times daily. 08/16/23   [provider]  DULoxetine (CYMBALTA) 60 MG capsule Take 60 mg by mouth daily.    [provider]  gabapentin  (NEURONTIN ) 300 MG capsule Take 300 mg by mouth 3 (three) times daily.    [provider]  ibuprofen (ADVIL) 600 MG tablet Take 600 mg by mouth every 6 (six) hours as needed for mild pain (pain score 1-3) or moderate pain (pain score 4-6). Patient not taking: Reported on 08/26/2023    [provider]  levothyroxine  (SYNTHROID ) 25 MCG tablet Take 25 mcg by mouth daily before breakfast.    [provider]  levothyroxine  (SYNTHROID ) 50 MCG tablet Take 50 mcg by mouth daily. 08/16/23   [provider]  LORazepam  (ATIVAN ) 1 MG tablet Take 1 tablet (1 mg total) by mouth 3 (three) times daily as needed for anxiety.  08/28/23   Ninetta Basket, MD  losartan (COZAAR) 25 MG tablet Take 25 mg by mouth daily.    [provider]  naltrexone  (DEPADE) 50 MG tablet Take 50 mg by mouth daily. Patient not taking: Reported on 08/26/2023    [provider]  propranolol  (INDERAL ) 20 MG tablet Take 20 mg by mouth 3 (three) times daily. 03/29/20   [provider]  rosuvastatin  (CRESTOR ) 20 MG tablet Take 20 mg by mouth daily. 10/15/22   [provider]  tamsulosin (FLOMAX) 0.4 MG CAPS capsule Take 0.4 mg by mouth.    [provider]  topiramate  (TOPAMAX ) 25 MG tablet Take 25 mg by mouth 2 (two) times daily. Patient not taking: Reported on 08/26/2023 09/10/22   [provider]  Vilazodone HCl (VIIBRYD) 40 MG TABS Take 40 mg by mouth daily. Patient not taking: Reported on 08/26/2023    [provider]  zolpidem (AMBIEN) 10 MG tablet Take 10 mg by mouth at bedtime as needed for sleep.    [provider]      Allergies    Escitalopram, Seroquel  [quetiapine  fumarate], and Trazodone    Review of Systems   Review of Systems  Physical Exam Updated Vital Signs BP (!) 117/54 (BP Location: Right Arm)   Pulse (!) 58   Temp 98.4 F (36.9 C) (Oral)   Resp (!) 22   Ht 5\' 6"  (1.676  m)   Wt 86.2 kg   SpO2 99%   BMI 30.67 kg/m  Physical Exam Vitals and nursing note reviewed.  Constitutional:      Comments: Anxious  HENT:     Head: Normocephalic and atraumatic.  Eyes:     Pupils: Pupils are equal, round, and reactive to light.  Cardiovascular:     Rate and Rhythm: Normal rate and regular rhythm.  Pulmonary:     Effort: Pulmonary effort is normal.     Breath sounds: Normal breath sounds.  Abdominal:     Palpations: Abdomen is soft.     Tenderness: There is no abdominal tenderness.  Skin:    General: Skin is warm and dry.  Neurological:     General: No focal deficit present.     Mental Status: She is alert and oriented to person, place, and time.      Sensory: No sensory deficit.     Motor: No weakness.  Psychiatric:        Mood and Affect: Mood normal.     ED Results / Procedures / Treatments   Labs (all labs ordered are listed, but only abnormal results are displayed) Labs Reviewed  CBC WITH DIFFERENTIAL/PLATELET - Abnormal; Notable for the following components:      Result Value   WBC 14.2 (*)    Neutro Abs 11.0 (*)    All other components within normal limits  COMPREHENSIVE METABOLIC PANEL WITH GFR - Abnormal; Notable for the following components:   Potassium 3.4 (*)    Glucose, Bld 103 (*)    AST 43 (*)    ALT 46 (*)    All other components within normal limits  URINALYSIS, ROUTINE W REFLEX MICROSCOPIC - Abnormal; Notable for the following components:   APPearance HAZY (*)    Ketones, ur 20 (*)    All other components within normal limits  LIPASE, BLOOD    EKG EKG Interpretation Date/Time:  Tuesday Oct 01 2023 18:22:42 EDT Ventricular Rate:  63 PR Interval:  116 QRS Duration:  88 QT Interval:  417 QTC Calculation: 427 R Axis:   25  Text Interpretation: Age not entered, assumed to be  63 years old for purpose of ECG interpretation Sinus rhythm Borderline short PR interval Borderline T abnormalities, diffuse leads Confirmed by Rafael Bun [phone]) on 10/01/2023 7:21:27 PM  Radiology DG Chest 2 View Result Date: 10/01/2023 CLINICAL DATA:  Anxiety vertigo EXAM: CHEST - 2 VIEW COMPARISON:  09/12/2022 FINDINGS: No acute airspace disease or pleural effusion. Borderline to mild cardiomegaly. No pneumothorax IMPRESSION: No active cardiopulmonary disease. Borderline to mild cardiomegaly. Electronically Signed   By: Esmeralda Hedge M.D.   On: 10/01/2023 20:03    Procedures Procedures    Medications Ordered in ED Medications  LORazepam  (ATIVAN ) injection 1 mg (1 mg Intravenous Given 10/01/23 1900)  sodium chloride  0.9 % bolus 1,000 mL (1,000 mLs Intravenous New Bag/Given 10/01/23 2000)    ED Course/ Medical  Decision Making/ A&P Clinical Course as of 10/01/23 2046  Tue Oct 01, 2023  2038 Laboratory workup shows leukocytosis but this is nearly the same value as 1 month ago.  Urine negative.  CMP looks okay.  Chest x-ray unremarkable patient has remained stable here afebrile normotensive.  Appropriate for discharge with PCP follow-up. [MP]    Clinical Course User Index [MP] Sallyanne Creamer, DO  Medical Decision Making 63 year old female sent in from behavioral health urgent care given concern for acute panic attack.  Currently undergoing treatment for UTI.  Chronic back pain.  Otherwise feels well.  Anxiety has improved since coming to the ED.  Given reported UTI and night sweats will obtain laboratory workup including CBC, metabolic panel and urinalysis along with chest x-ray to look for any evidence of infection.  If medical workup is okay and she is still feeling well she would be appropriate for discharge with outpatient PCP and mental follow-up           Final Clinical Impression(s) / ED Diagnoses Final diagnoses:  Panic attack    Rx / DC Orders ED Discharge Orders     None         Sallyanne Creamer, DO 10/01/23 2046

## 2023-10-01 NOTE — Discharge Instructions (Signed)
 You were seen in the Emergency Department after a panic attack You were able to calm down after some fluids and a dose of Ativan  here Your blood work looked okay Your urine test did not show UTI The chest x-ray did not show pneumonia It is important that you follow-up with your primary care doctor in 1 week for reevaluation You should also follow-up with your mental health provider regarding today's anxiety attack Continue taking all previous prescribed indications Return to the emergency department for severe anxiety or any other concerns

## 2023-10-01 NOTE — ED Notes (Signed)
 Pt hyperventilating, so diaphoretic that she is drenched, tingling in hands, shaky and unable to sit still. NP notified.

## 2023-10-01 NOTE — Telephone Encounter (Unsigned)
 Copied from CRM [id number]. Topic: Clinical - Medical Advice >> Oct 01, 2023  3:14 PM Baldomero Bone wrote: Reason for CRM: Patient called to cancel her appointment with Connell Degree because she is on the way to the hospital. Patient is going to [facility], and ask that her PCP call to check on her.

## 2023-10-01 NOTE — ED Notes (Signed)
 Veronique NP notified of pt BP

## 2023-10-01 NOTE — ED Provider Triage Note (Signed)
 Emergency Medicine Provider Triage Evaluation Note  Dawn Kelley , a 63 y.o. female  was evaluated in triage.  Pt complains of anxiety back pain questionable urinary tract infection.  Patient states symptoms ongoing for several weeks maybe even 4 months.  Patient went to [facility] [place] [facility] [place] [facility].  Hyperventilation anxiety was given 1 mg Ativan  patient was released from psychiatric hospital several days ago EMS reports patient taking benzos which has been for years.  But was suddenly stopped.  So it sounds as if this could be a benzo withdrawal.  Patient's vital signs are reassuring.  Oxygen saturation 99%.  Temp 98.3.  Past medical history significant posttraumatic stress disorder bipolar disorder obsessive-compulsive disorder electroconvulsive therapy history of depression.  Patient last seen by us  on April 30 for anxiety at [facility].  Review of Systems  Positive: Abdominal pain Negative: Chest pain  Physical Exam  BP (!) 136/91   Pulse 88   Temp 98.3 F (36.8 C) (Oral)   Resp (!) 28   SpO2 100%  Gen: Awake, anxious Resp: Normal effort  MSK: Moves extremities without difficulty tremors Other:  Medical Decision Making  Medically screening exam initiated at 5:58 PM.  Appropriate orders placed.  Dawn Kelley was informed that the remainder of the evaluation will be completed by another provider, this initial triage assessment does not replace that evaluation, and the importance of remaining in the ED until their evaluation is complete.  Patient with multiple complaints.  A lot of this is probably behavioral health in nature.  Sent from the behavioral health urgent care.  Will evaluate the back pain questionable urinary tract infection give more benzos patient will need cardiac monitoring.  EKG labs.  If medically cleared patient then can be reevaluated by behavioral health.   Marcelles Clinard, MD 10/01/23 [phone]

## 2023-10-03 ENCOUNTER — Inpatient Hospital Stay: Admitting: Family Medicine

## 2023-10-03 DIAGNOSIS — I10 Essential (primary) hypertension: Secondary | ICD-10-CM | POA: Diagnosis not present

## 2023-10-03 DIAGNOSIS — Z1152 Encounter for screening for COVID-19: Secondary | ICD-10-CM | POA: Diagnosis not present

## 2023-10-03 DIAGNOSIS — Z79899 Other long term (current) drug therapy: Secondary | ICD-10-CM | POA: Diagnosis not present

## 2023-10-03 DIAGNOSIS — Z720 Tobacco use: Secondary | ICD-10-CM | POA: Diagnosis not present

## 2023-10-03 DIAGNOSIS — Z5329 Procedure and treatment not carried out because of patient's decision for other reasons: Secondary | ICD-10-CM | POA: Diagnosis not present

## 2023-10-03 DIAGNOSIS — E785 Hyperlipidemia, unspecified: Secondary | ICD-10-CM | POA: Diagnosis not present

## 2023-10-09 ENCOUNTER — Ambulatory Visit: Payer: Self-pay | Admitting: *Deleted

## 2023-10-09 DIAGNOSIS — F331 Major depressive disorder, recurrent, moderate: Secondary | ICD-10-CM

## 2023-10-09 DIAGNOSIS — F411 Generalized anxiety disorder: Secondary | ICD-10-CM

## 2023-10-09 NOTE — Telephone Encounter (Signed)
 Chief Complaint: severe panic attack , crying , overwhelmed feelings, nervousness Symptoms: panic attack , nervous speech, sounding "shaky" , anxious. Chest discomfort. Agitated. Not eating for days . Drinks excess in water  per husband. Did drink beers today . Reports having diarrhea.  Frequency: now  and reports panic attack has been going on x 1 month  Pertinent Negatives: Patient and husband denies patient hurting self or others.  Disposition: [] ED /[] Urgent Care (no appt availability in office) / [] Appointment(In office/virtual)/ []  [facility]/ [] Home Care/ [x] Refused Recommended Disposition /[] [facility]/ []  Follow-up with PCP Additional Notes:    Instructed patient to go to ED and patient became worse with panic attack, reports she has been to multiple EDs in area and was told to "not come back". NT requesting patient to allow to call 911 and patient and husband requesting not to call 911 because they would only take patient to EDs she has already been to . Recommended husband to take patient to [facility] health or [facility] and patient declined. Gave information for Urgent crisis center and # to call if needed.  Patient attempting to see PCP but in review of chart Ms. Rosalynn Come from [facility] will see patient but not for psych issues and has placed referrals. Patient reports her psych dr was dropped from her insurance and she does not know another psych dr to assume her care. Again recommended ED and or try Urgent crisis center for additional recommendations.  Please advise if any other referrals can be placed.  CAL notified of request and patient refusal for 911 or ED.      Copied from CRM [id number]. Topic: Clinical - Red Word Triage >> Oct 09, 2023  2:22 PM Hamp Levine R wrote: Red Word that prompted transfer to Nurse Triage: Patient is in need of medication for her anxiety. Very anxious over the phone. Reason for Disposition  [1] Difficulty breathing AND [2] persists > 10 minutes AND  [3] not relieved by reassurance provided by triager  Answer Assessment - Initial Assessment Questions 1. CONCERN: "Did anything happen that prompted you to call today?"      Severe panic attack  2. ANXIETY SYMPTOMS: "Can you describe how you (your loved one; patient) have been feeling?" (e.g., tense, restless, panicky, anxious, keyed up, overwhelmed, sense of impending doom).      Overwhelmed, anxious  3. ONSET: "How long have you been feeling this way?" (e.g., hours, days, weeks)     Greater than  4. SEVERITY: "How would you rate the level of anxiety?" (e.g., 0 - 10; or mild, moderate, severe).     *No Answer* 5. FUNCTIONAL IMPAIRMENT: "How have these feelings affected your ability to do daily activities?" "Have you had more difficulty than usual doing your normal daily activities?" (e.g., getting better, same, worse; self-care, school, work, interactions)     *No Answer* 6. HISTORY: "Have you felt this way before?" "Have you ever been diagnosed with an anxiety problem in the past?" (e.g., generalized anxiety disorder, panic attacks, PTSD). If Yes, ask: "How was this problem treated?" (e.g., medicines, counseling, etc.)     *No Answer* 7. RISK OF HARM - SUICIDAL IDEATION: "Do you ever have thoughts of hurting or killing yourself?" If Yes, ask:  "Do you have these feelings now?" "Do you have a plan on how you would do this?"     *No Answer* 8. TREATMENT:  "What has been done so far to treat this anxiety?" (e.g., medicines, relaxation strategies). "What has  helped?"     *No Answer* 9. TREATMENT - THERAPIST: "Do you have a counselor or therapist? Name?"     *No Answer* 10. POTENTIAL TRIGGERS: "Do you drink caffeinated beverages (e.g., coffee, colas, teas), and how much daily?" "Do you drink alcohol or use any drugs?" "Have you started any new medicines recently?"       *No Answer* 11. PATIENT SUPPORT: "Who is with you now?" "Who do you live with?" "Do you have family or friends who you can talk  to?"        *No Answer* 12. OTHER SYMPTOMS: "Do you have any other symptoms?" (e.g., feeling depressed, trouble concentrating, trouble sleeping, trouble breathing, palpitations or fast heartbeat, chest pain, sweating, nausea, or diarrhea)       *No Answer* 13. PREGNANCY: "Is there any chance you are pregnant?" "When was your last menstrual period?"       na  Protocols used: Anxiety and Panic Attack-A-AH

## 2023-10-10 NOTE — Addendum Note (Signed)
 Addended by: Jacqualyn Mates on: 10/10/2023 09:32 PM   Modules accepted: Orders

## 2023-10-10 NOTE — Telephone Encounter (Signed)
 Spoke with pt's husband per dpr. Agreeable to new referral. Informed him I will let pcp know. Verbalized understanding

## 2023-10-11 NOTE — Telephone Encounter (Signed)
 I spoke to pt and her husband and advised of provider feedback and they both voiced understanding. Pt did say she doesn't have any plans of self harm at the moment and feels safe.

## 2023-10-14 ENCOUNTER — Encounter (HOSPITAL_COMMUNITY): Payer: Self-pay | Admitting: Emergency Medicine

## 2023-10-14 ENCOUNTER — Emergency Department (HOSPITAL_COMMUNITY)
Admission: EM | Admit: 2023-10-14 | Discharge: 2023-10-14 | Disposition: A | Discharge status: Home / Self Care | Attending: Emergency Medicine | Admitting: Emergency Medicine

## 2023-10-14 ENCOUNTER — Ambulatory Visit: Payer: Self-pay

## 2023-10-14 ENCOUNTER — Other Ambulatory Visit: Payer: Self-pay

## 2023-10-14 DIAGNOSIS — F419 Anxiety disorder, unspecified: Secondary | ICD-10-CM | POA: Diagnosis present

## 2023-10-14 DIAGNOSIS — Z79899 Other long term (current) drug therapy: Secondary | ICD-10-CM | POA: Diagnosis not present

## 2023-10-14 DIAGNOSIS — Z743 Need for continuous supervision: Secondary | ICD-10-CM | POA: Diagnosis not present

## 2023-10-14 DIAGNOSIS — R6889 Other general symptoms and signs: Secondary | ICD-10-CM | POA: Diagnosis not present

## 2023-10-14 LAB — CBC WITH DIFFERENTIAL/PLATELET
Abs Immature Granulocytes: 0.07 10*3/uL (ref 0.00–0.07)
Basophils Absolute: 0.1 10*3/uL (ref 0.0–0.1)
Basophils Relative: 0 %
Eosinophils Absolute: 0 10*3/uL (ref 0.0–0.5)
Eosinophils Relative: 0 %
HCT: 43.2 % (ref 36.0–46.0)
Hemoglobin: 14.7 g/dL (ref 12.0–15.0)
Immature Granulocytes: 0 %
Lymphocytes Relative: 18 %
Lymphs Abs: 2.8 10*3/uL (ref 0.7–4.0)
MCH: 29.7 pg (ref 26.0–34.0)
MCHC: 34 g/dL (ref 30.0–36.0)
MCV: 87.3 fL (ref 80.0–100.0)
Monocytes Absolute: 0.7 10*3/uL (ref 0.1–1.0)
Monocytes Relative: 5 %
Neutro Abs: 12 10*3/uL — ABNORMAL HIGH (ref 1.7–7.7)
Neutrophils Relative %: 77 %
Platelets: 374 10*3/uL (ref 150–400)
RBC: 4.95 MIL/uL (ref 3.87–5.11)
RDW: 12.3 % (ref 11.5–15.5)
WBC: 15.7 10*3/uL — ABNORMAL HIGH (ref 4.0–10.5)
nRBC: 0 % (ref 0.0–0.2)

## 2023-10-14 LAB — COMPREHENSIVE METABOLIC PANEL WITH GFR
ALT: 39 U/L (ref 0–44)
AST: 37 U/L (ref 15–41)
Albumin: 3.6 g/dL (ref 3.5–5.0)
Alkaline Phosphatase: 117 U/L (ref 38–126)
Anion gap: 14 (ref 5–15)
BUN: 13 mg/dL (ref 8–23)
CO2: 21 mmol/L — ABNORMAL LOW (ref 22–32)
Calcium: 9.3 mg/dL (ref 8.9–10.3)
Chloride: 103 mmol/L (ref 98–111)
Creatinine, Ser: 0.57 mg/dL (ref 0.44–1.00)
GFR, Estimated: >60 mL/min (ref >60)
Glucose, Bld: 122 mg/dL — ABNORMAL HIGH (ref 70–99)
Potassium: 3.9 mmol/L (ref 3.5–5.1)
Sodium: 138 mmol/L (ref 135–145)
Total Bilirubin: 0.6 mg/dL (ref 0.0–1.2)
Total Protein: 7.6 g/dL (ref 6.5–8.1)

## 2023-10-14 LAB — TSH: TSH: 5.533 u[IU]/mL — ABNORMAL HIGH (ref 0.350–4.500)

## 2023-10-14 LAB — ETHANOL: Alcohol, Ethyl (B): <15 mg/dL (ref <15)

## 2023-10-14 MED ORDER — LORAZEPAM 2 MG/ML IJ SOLN
2.0000 mg | Freq: Once | INTRAMUSCULAR | Status: AC
  Administered 2023-10-14: 2 mg via INTRAMUSCULAR
  Filled 2023-10-14: qty 1

## 2023-10-14 MED ORDER — LORAZEPAM 1 MG PO TABS
1.0000 mg | ORAL_TABLET | Freq: Once | ORAL | Status: AC
  Administered 2023-10-14: 1 mg via ORAL
  Filled 2023-10-14: qty 1

## 2023-10-14 MED ORDER — LORAZEPAM 1 MG PO TABS: 1.0000 mg | ORAL_TABLET | Freq: Three times a day (TID) | ORAL | 0 refills | Status: DC | PRN

## 2023-10-14 NOTE — ED Triage Notes (Signed)
 Pt BIB [facility] for pysch eval, pt very nervous and anxious in triage with hyperventilation, spouse reports she missed 1 day of her meds on 5/30, v/s en route 135/70, HR 80, 99% RA

## 2023-10-14 NOTE — ED Provider Notes (Signed)
 [place] EMERGENCY DEPARTMENT AT [facility] Provider Note   CSN: [id number] Arrival date & time: 10/14/23  1356     History  Chief Complaint  Patient presents with   V70.1    Notnamed Croucher is a 63 y.o. female.  HPI Patient presents concern of worsening anxiousness. No suicidal or homicidal ideation.  She notes a history of PTSD, anxiety, states that she has recently been seen and evaluated multiple emergency departments and hospitals.  Current behavioral regimen does not include benzodiazepine though she was on these chronically for years.  She presents today with worsening anxiousness about her condition, with some concern about inability to obtain outpatient psychiatric care.  No physical pain, complaints, no suicidal or homicidal ideation.  Patient was initially alone arriving via EMS.  No EMS report of hemodynamic instability.    Home Medications Prior to Admission medications   Medication Sig Start Date End Date Taking? Authorizing Provider  busPIRone  (BUSPAR ) 10 MG tablet Take 10 mg by mouth 3 (three) times daily. 07/03/23   [provider]  busPIRone  (BUSPAR ) 15 MG tablet Take 15 mg by mouth 3 (three) times daily. 08/16/23   [provider]  DULoxetine (CYMBALTA) 60 MG capsule Take 60 mg by mouth daily.    [provider]  gabapentin  (NEURONTIN ) 300 MG capsule Take 300 mg by mouth 3 (three) times daily.    [provider]  ibuprofen (ADVIL) 600 MG tablet Take 600 mg by mouth every 6 (six) hours as needed for mild pain (pain score 1-3) or moderate pain (pain score 4-6). Patient not taking: Reported on 08/26/2023    [provider]  levothyroxine  (SYNTHROID ) 25 MCG tablet Take 25 mcg by mouth daily before breakfast.    [provider]  levothyroxine  (SYNTHROID ) 50 MCG tablet Take 50 mcg by mouth daily. 08/16/23   [provider]  LORazepam  (ATIVAN ) 1 MG tablet Take 1 tablet (1 mg total) by mouth 3 (three)  times daily as needed for anxiety. 10/14/23   Dorenda Gandy, MD  losartan (COZAAR) 25 MG tablet Take 25 mg by mouth daily.    [provider]  naltrexone  (DEPADE) 50 MG tablet Take 50 mg by mouth daily. Patient not taking: Reported on 08/26/2023    [provider]  propranolol  (INDERAL ) 20 MG tablet Take 20 mg by mouth 3 (three) times daily. 03/29/20   [provider]  rosuvastatin  (CRESTOR ) 20 MG tablet Take 20 mg by mouth daily. 10/15/22   [provider]  tamsulosin (FLOMAX) 0.4 MG CAPS capsule Take 0.4 mg by mouth.    [provider]  topiramate  (TOPAMAX ) 25 MG tablet Take 25 mg by mouth 2 (two) times daily. Patient not taking: Reported on 08/26/2023 09/10/22   [provider]  Vilazodone HCl (VIIBRYD) 40 MG TABS Take 40 mg by mouth daily. Patient not taking: Reported on 08/26/2023    [provider]  zolpidem (AMBIEN) 10 MG tablet Take 10 mg by mouth at bedtime as needed for sleep.    [provider]      Allergies    Escitalopram, Seroquel  [quetiapine  fumarate], and Trazodone    Review of Systems   Review of Systems  Physical Exam Updated Vital Signs BP (!) 159/137 (BP Location: Right Arm)   Pulse (!) 126   Temp 98.5 F (36.9 C) (Oral)   Resp (!) 36   Ht 5\' 6"  (1.676 m)   Wt 86.2 kg   SpO2 100%  BMI 30.67 kg/m  Physical Exam Vitals and nursing note reviewed.  Constitutional:      General: She is not in acute distress.    Appearance: She is well-developed.  HENT:     Head: Normocephalic and atraumatic.  Eyes:     Conjunctiva/sclera: Conjunctivae normal.  Cardiovascular:     Rate and Rhythm: Normal rate and regular rhythm.  Pulmonary:     Effort: Pulmonary effort is normal. No respiratory distress.     Breath sounds: Normal breath sounds. No stridor.  Abdominal:     General: There is no distension.  Skin:    General: Skin is warm and dry.  Neurological:     Mental Status: She is alert and  oriented to person, place, and time.     Cranial Nerves: No cranial nerve deficit.  Psychiatric:        Mood and Affect: Mood is anxious.     ED Results / Procedures / Treatments   Labs (all labs ordered are listed, but only abnormal results are displayed) Labs Reviewed  COMPREHENSIVE METABOLIC PANEL WITH GFR - Abnormal; Notable for the following components:      Result Value   CO2 21 (*)    Glucose, Bld 122 (*)    All other components within normal limits  CBC WITH DIFFERENTIAL/PLATELET - Abnormal; Notable for the following components:   WBC 15.7 (*)    Neutro Abs 12.0 (*)    All other components within normal limits  ETHANOL  TSH    EKG None  Radiology No results found.  Procedures Procedures    Medications Ordered in ED Medications  LORazepam  (ATIVAN ) injection 2 mg (2 mg Intramuscular Given 10/14/23 1508)  LORazepam  (ATIVAN ) tablet 1 mg (1 mg Oral Given 10/14/23 1746)    ED Course/ Medical Decision Making/ A&P                                 Medical Decision Making Adult female with 9 prior ED visits over the past 6 months here, history of bipolar disorder, depression, as well as medical problems presents with anxiousness.  Patient is awake, alert, insightful terms of her difficulty with consistent outpatient therapy, now presents with anxiousness.  Patient is oriented appropriately, not homicidal, not suicidal, labs reviewed, discussed, and on repeat exam the patient was accompanied by her husband.  Patient has calmed markedly with benzodiazepine, will continue these at discharge, was encouraged to follow-up with either her anticipated behavioral specialist here, or to return to [facility] to our outpatient behavioral health facility.  Amount and/or Complexity of Data Reviewed Independent Historian: spouse and EMS External Data Reviewed: notes. Labs: ordered. Decision-making details documented in ED Course.  Risk Prescription drug management. Decision regarding  hospitalization. Diagnosis or treatment significantly limited by social determinants of health.   5:46 PM Patient much calmer on repeat exam,, did by family member, we discussed today's findings, plan for follow-up, plan for additional meds.        Final Clinical Impression(s) / ED Diagnoses Final diagnoses:  Anxiousness    Rx / DC Orders ED Discharge Orders          Ordered    LORazepam  (ATIVAN ) 1 MG tablet  3 times daily PRN        10/14/23 1746              Dorenda Gandy, MD 10/14/23 1746

## 2023-10-14 NOTE — ED Notes (Signed)
 Unable to obtain oral temp, temporal attempted and was 95.2

## 2023-10-14 NOTE — ED Notes (Signed)
 Pt in hallway hyperventilating, asking for MD

## 2023-10-14 NOTE — ED Notes (Signed)
 Pt stated she forgot to tell the Dr about her vertigo and asked that I tell him. Pt was in hallway near room 14. I directed her back to her room.

## 2023-10-14 NOTE — Discharge Instructions (Signed)
 Please be sure to follow-up with either your local behavioral health specialist or at our behavioral center in [place].  Return here for concerning changes in your condition.  Otherwise take all medication as prescribed.

## 2023-10-14 NOTE — ED Notes (Signed)
 Pt reports daily alcohol and marijuana use

## 2023-10-14 NOTE — Telephone Encounter (Signed)
 Copied from CRM [id number]. Topic: Clinical - Red Word Triage >> Oct 14, 2023  9:28 AM Jenice Mitts wrote: Red Word that prompted transfer to Nurse Triage: Mental break down/ severe pain    Chief Complaint: Mental Health Symptoms: Anxiety Frequency: months per patient Pertinent Negatives: Patient denies ------ Disposition: [x] ED /[] Urgent Care (no appt availability in office) / [] Appointment(In office/virtual)/ []  [facility] [facility]/ [] Home Care/ [] Refused Recommended Disposition /[] [facility]/ []  Follow-up with PCP Additional Notes: Patient called and advised that she is having mental health problems right now. Patient crying and difficulty to understand.  This RN attempted to calm patient and use breathing techniques.  Patient states that her husband is there with her. Patient states that she has been pouring sweat for days. She states that she is so nervous and afraid right now. Patient states she has been to the hospital so many times. She states that nobody has helped her and   Patient states that she did not take her medications the past 3 days.  Patient states that she had been taking  "tranquilizers" for many many years and they took them away recently. Patient states that the medications that she was on are the only things that work.   This RN offered an ambulance multiple times and patient kept refusing to be taken to the hospital.  This RN asked the patient if she has tried to call or see the [facility] that was in the notes from a previous encounter 10/09/2023 that was given to her and her husband at that time.  She states no and that she didn't know and started speaking about how hospitals don't help her, they just put her in a room and leave her there. This RN attempted to call [phone] the  Behavioral Health 24 Hour Hotline with no answer.  Patient finally agreed to let this RN call an ambulance to come assess her. This RN called  911 at 10 AM to dispatch an ambulance to the patient at this time.  Patient is inconsolable throughout this entire process of triage.  Patient states during the 911 call that she didn't want to go to the hospital if she cannot have water .  Patient was fixated on this and stated that they kept water  from her at the hospital last time. Patient states that she has PTSD and was locked up in a closet for 10 years of her life.  10:10 AM  A Hydrographic surveyor on scene with the patient and her husband at this time waiting on EMS. Patient denies any thoughts of suicide.  Patient states that she is continuously sweating and she states that her sweat "smells like a cat litter box".  Patient kept saying that she wanted her PCP to send her in some medications for her nerves.  This RN called the [facility] to advise them of this situation going on right now.  They advised that there are no openings in the office today and going to the hospital is going to be the best thing for her at this time. Patient is heard to be getting ready to go to the hospital with EMS at this time. Ambulance Crew/Law Enforcement/Fire Personnel/First Responders all did a great job with the patient at her home to gain her trust and help her get the care she needs at this time.  Patient's husband advised that the patient was getting ready to go to the hospital with Emergency Personnel. Patient's husband is  advised to call us  back if anything changes.       Reason for Disposition  [1] Difficulty breathing AND [2] persists > 10 minutes AND [3] not relieved by reassurance provided by triager  Answer Assessment - Initial Assessment Questions 1. CONCERN: "Did anything happen that prompted you to call today?"      Inconsolable at this time 2. ANXIETY SYMPTOMS: "Can you describe how you (your loved one; patient) have been feeling?" (e.g., tense, restless, panicky, anxious, keyed up, overwhelmed, sense of impending doom).        Inconsolable at this time 3. ONSET: "How long have you been feeling this way?" (e.g., hours, days, weeks)     Pt said weeks 4. SEVERITY: "How would you rate the level of anxiety?" (e.g., 0 - 10; or mild, moderate, severe).     SEVERE 5. FUNCTIONAL IMPAIRMENT: "How have these feelings affected your ability to do daily activities?" "Have you had more difficulty than usual doing your normal daily activities?" (e.g., getting better, same, worse; self-care, school, work, interactions)     Severe impairment 6. HISTORY: "Have you felt this way before?" "Have you ever been diagnosed with an anxiety problem in the past?" (e.g., generalized anxiety disorder, panic attacks, PTSD). If Yes, ask: "How was this problem treated?" (e.g., medicines, counseling, etc.)     ----- 7. RISK OF HARM - SUICIDAL IDEATION: "Do you ever have thoughts of hurting or killing yourself?" If Yes, ask:  "Do you have these feelings now?" "Do you have a plan on how you would do this?"     Patient denies 8. TREATMENT:  "What has been done so far to treat this anxiety?" (e.g., medicines, relaxation strategies). "What has helped?"     --- 9. TREATMENT - THERAPIST: "Do you have a counselor or therapist? Name?"     ---- 10. POTENTIAL TRIGGERS: "Do you drink caffeinated beverages (e.g., coffee, colas, teas), and how much daily?" "Do you drink alcohol or use any drugs?" "Have you started any new medicines recently?"       ---- 11. PATIENT SUPPORT: "Who is with you now?" "Who do you live with?" "Do you have family or friends who you can talk to?"        Patient's husband 59. OTHER SYMPTOMS: "Do you have any other symptoms?" (e.g., feeling depressed, trouble concentrating, trouble sleeping, trouble breathing, palpitations or fast heartbeat, chest pain, sweating, nausea, or diarrhea)       Upset, crying, patient states  Protocols used: Anxiety and Panic Attack-A-AH

## 2023-10-21 ENCOUNTER — Emergency Department (HOSPITAL_COMMUNITY)

## 2023-10-21 ENCOUNTER — Other Ambulatory Visit: Payer: Self-pay

## 2023-10-21 ENCOUNTER — Emergency Department (HOSPITAL_COMMUNITY)
Admission: EM | Admit: 2023-10-21 | Discharge: 2023-10-22 | Attending: Emergency Medicine | Admitting: Emergency Medicine

## 2023-10-21 ENCOUNTER — Encounter (HOSPITAL_COMMUNITY): Payer: Self-pay

## 2023-10-21 DIAGNOSIS — F410 Panic disorder [episodic paroxysmal anxiety] without agoraphobia: Secondary | ICD-10-CM | POA: Diagnosis not present

## 2023-10-21 DIAGNOSIS — Z79899 Other long term (current) drug therapy: Secondary | ICD-10-CM | POA: Diagnosis not present

## 2023-10-21 DIAGNOSIS — R531 Weakness: Secondary | ICD-10-CM | POA: Insufficient documentation

## 2023-10-21 DIAGNOSIS — F411 Generalized anxiety disorder: Secondary | ICD-10-CM | POA: Insufficient documentation

## 2023-10-21 DIAGNOSIS — F603 Borderline personality disorder: Secondary | ICD-10-CM | POA: Diagnosis present

## 2023-10-21 DIAGNOSIS — I10 Essential (primary) hypertension: Secondary | ICD-10-CM | POA: Diagnosis not present

## 2023-10-21 DIAGNOSIS — F1320 Sedative, hypnotic or anxiolytic dependence, uncomplicated: Secondary | ICD-10-CM | POA: Diagnosis present

## 2023-10-21 DIAGNOSIS — R42 Dizziness and giddiness: Secondary | ICD-10-CM | POA: Diagnosis not present

## 2023-10-21 LAB — CBC WITH DIFFERENTIAL/PLATELET
Abs Immature Granulocytes: 0.05 10*3/uL (ref 0.00–0.07)
Basophils Absolute: 0.1 10*3/uL (ref 0.0–0.1)
Basophils Relative: 0 %
Eosinophils Absolute: 0 10*3/uL (ref 0.0–0.5)
Eosinophils Relative: 0 %
HCT: 47 % — ABNORMAL HIGH (ref 36.0–46.0)
Hemoglobin: 15.7 g/dL — ABNORMAL HIGH (ref 12.0–15.0)
Immature Granulocytes: 0 %
Lymphocytes Relative: 25 %
Lymphs Abs: 2.9 10*3/uL (ref 0.7–4.0)
MCH: 28.6 pg (ref 26.0–34.0)
MCHC: 33.4 g/dL (ref 30.0–36.0)
MCV: 85.8 fL (ref 80.0–100.0)
Monocytes Absolute: 0.8 10*3/uL (ref 0.1–1.0)
Monocytes Relative: 7 %
Neutro Abs: 8 10*3/uL — ABNORMAL HIGH (ref 1.7–7.7)
Neutrophils Relative %: 68 %
Platelets: 407 10*3/uL — ABNORMAL HIGH (ref 150–400)
RBC: 5.48 MIL/uL — ABNORMAL HIGH (ref 3.87–5.11)
RDW: 12 % (ref 11.5–15.5)
WBC: 11.8 10*3/uL — ABNORMAL HIGH (ref 4.0–10.5)
nRBC: 0 % (ref 0.0–0.2)

## 2023-10-21 LAB — ETHANOL: Alcohol, Ethyl (B): <15 mg/dL (ref <15)

## 2023-10-21 LAB — URINALYSIS, ROUTINE W REFLEX MICROSCOPIC
Bilirubin Urine: NEGATIVE
Glucose, UA: NEGATIVE mg/dL
Hgb urine dipstick: NEGATIVE
Ketones, ur: NEGATIVE mg/dL
Leukocytes,Ua: NEGATIVE
Nitrite: NEGATIVE
Protein, ur: NEGATIVE mg/dL
Specific Gravity, Urine: 1.005 (ref 1.005–1.030)
pH: 6 (ref 5.0–8.0)

## 2023-10-21 LAB — COMPREHENSIVE METABOLIC PANEL WITH GFR
ALT: 43 U/L (ref 0–44)
AST: 30 U/L (ref 15–41)
Albumin: 4 g/dL (ref 3.5–5.0)
Alkaline Phosphatase: 110 U/L (ref 38–126)
Anion gap: 12 (ref 5–15)
BUN: 9 mg/dL (ref 8–23)
CO2: 20 mmol/L — ABNORMAL LOW (ref 22–32)
Calcium: 9.5 mg/dL (ref 8.9–10.3)
Chloride: 103 mmol/L (ref 98–111)
Creatinine, Ser: 0.58 mg/dL (ref 0.44–1.00)
GFR, Estimated: >60 mL/min (ref >60)
Glucose, Bld: 117 mg/dL — ABNORMAL HIGH (ref 70–99)
Potassium: 3.9 mmol/L (ref 3.5–5.1)
Sodium: 135 mmol/L (ref 135–145)
Total Bilirubin: 0.8 mg/dL (ref 0.0–1.2)
Total Protein: 8.1 g/dL (ref 6.5–8.1)

## 2023-10-21 LAB — RAPID URINE DRUG SCREEN, HOSP PERFORMED
Amphetamines: NOT DETECTED
Barbiturates: NOT DETECTED
Benzodiazepines: POSITIVE — AB
Cocaine: NOT DETECTED
Opiates: NOT DETECTED
Tetrahydrocannabinol: POSITIVE — AB

## 2023-10-21 MED ORDER — BUSPIRONE HCL 5 MG PO TABS
15.0000 mg | ORAL_TABLET | Freq: Three times a day (TID) | ORAL | Status: DC
  Administered 2023-10-21 – 2023-10-22 (×2): 15 mg via ORAL
  Filled 2023-10-21 (×2): qty 3

## 2023-10-21 MED ORDER — LOSARTAN POTASSIUM 25 MG PO TABS
25.0000 mg | ORAL_TABLET | Freq: Every day | ORAL | Status: DC
  Administered 2023-10-22: 25 mg via ORAL
  Filled 2023-10-21: qty 1

## 2023-10-21 MED ORDER — ROSUVASTATIN CALCIUM 20 MG PO TABS
20.0000 mg | ORAL_TABLET | Freq: Every day | ORAL | Status: DC
  Administered 2023-10-21 – 2023-10-22 (×2): 20 mg via ORAL
  Filled 2023-10-21 (×2): qty 1

## 2023-10-21 MED ORDER — ONDANSETRON HCL 4 MG/2ML IJ SOLN
4.0000 mg | Freq: Once | INTRAMUSCULAR | Status: AC
  Administered 2023-10-21: 4 mg via INTRAVENOUS
  Filled 2023-10-21: qty 2

## 2023-10-21 MED ORDER — CLONAZEPAM 0.5 MG PO TABS
0.5000 mg | ORAL_TABLET | Freq: Two times a day (BID) | ORAL | Status: DC | PRN
  Administered 2023-10-22: 0.5 mg via ORAL
  Filled 2023-10-21: qty 1

## 2023-10-21 MED ORDER — LACTATED RINGERS IV BOLUS
1000.0000 mL | Freq: Once | INTRAVENOUS | Status: AC
  Administered 2023-10-21: 1000 mL via INTRAVENOUS

## 2023-10-21 MED ORDER — PROPRANOLOL HCL 10 MG PO TABS
20.0000 mg | ORAL_TABLET | Freq: Three times a day (TID) | ORAL | Status: DC
  Administered 2023-10-21 – 2023-10-22 (×2): 20 mg via ORAL
  Filled 2023-10-21 (×2): qty 2

## 2023-10-21 MED ORDER — LORAZEPAM 1 MG PO TABS
2.0000 mg | ORAL_TABLET | Freq: Once | ORAL | Status: AC
  Administered 2023-10-21: 2 mg via ORAL
  Filled 2023-10-21: qty 2

## 2023-10-21 MED ORDER — LEVOTHYROXINE SODIUM 50 MCG PO TABS
25.0000 ug | ORAL_TABLET | Freq: Every day | ORAL | Status: DC
  Administered 2023-10-22: 25 ug via ORAL
  Filled 2023-10-21: qty 1

## 2023-10-21 MED ORDER — DULOXETINE HCL 30 MG PO CPEP
60.0000 mg | ORAL_CAPSULE | Freq: Every day | ORAL | Status: DC
  Administered 2023-10-21 – 2023-10-22 (×2): 60 mg via ORAL
  Filled 2023-10-21 (×2): qty 2

## 2023-10-21 NOTE — ED Provider Notes (Signed)
 [place] EMERGENCY DEPARTMENT AT [facility] Provider Note   CSN: [id number] Arrival date & time: 10/21/23  1154     History  Chief Complaint  Patient presents with   Panic Attack    Dawn Kelley is a 63 y.o. female with PMH as listed below who presents via [facility] from [facility]) for complaint of a panic attack. Per EMS, the facility called EMS to have the Pt removed from their facility. Pt reports being locked up in a closet for 10 years in her childhood and receiving 20 years of therapy for that in [place]. She denies SI/HI/AH/VH, drug or alcohol use. Pt reports her husband has her anxiety medication. 10 prior ED visits over the past 6 months here. Her chart review patient was seen on 10/14/23 for worsening anxiety, reportedly had been seen and evaluated at multiple [place] and hospitals. Concerned then about inability to obtain outpatient psychiatric care. Was prescribed ativan  1 mg TID PRN at this visit. She states that she's not sure if those helped before but those pills are gone now she states because she used them all. She reports that her insurance company dropped her psychiatrist and she can no longer see him any more for the last 3 months.   She endorses weakness/dizziness and difficulty getting around her house. She states she feels like she is going to die. She states she fell last night and did hit her head but did not lose consciousness. Denies abdominal pain but endorses nausea. States she gets frequent UTIs and also endorses chronic nonbloody diarrhea. She denies current urianry sxs or fevers/chills, numbness/tingling, asymmetric weakness, headache, visual changes.   Past Medical History:  Diagnosis Date   Anxiety    Bipolar 1 disorder (HCC)    Depression    OCD (obsessive compulsive disorder)    PTSD (post-traumatic stress disorder)    S/P ECT (electroconvulsive therapy)        Home Medications Prior to Admission medications   Medication  Sig Start Date End Date Taking? Authorizing Provider  busPIRone  (BUSPAR ) 15 MG tablet Take 15 mg by mouth 3 (three) times daily. 08/16/23  Yes [provider]  clonazePAM  (KLONOPIN ) 0.5 MG tablet Take 0.5 mg by mouth 2 (two) times daily as needed for anxiety. 09/28/23  Yes [provider]  DULoxetine (CYMBALTA) 60 MG capsule Take 60 mg by mouth daily.   Yes [provider]  ibuprofen (ADVIL) 600 MG tablet Take 600 mg by mouth every 6 (six) hours as needed for mild pain (pain score 1-3) or moderate pain (pain score 4-6).   Yes [provider]  levothyroxine  (SYNTHROID ) 25 MCG tablet Take 25 mcg by mouth daily before breakfast.   Yes [provider]  LORazepam  (ATIVAN ) 1 MG tablet Take 1 tablet (1 mg total) by mouth 3 (three) times daily as needed for anxiety. 10/14/23  Yes Dorenda Gandy, MD  losartan (COZAAR) 25 MG tablet Take 25 mg by mouth daily.   Yes [provider]  pantoprazole  (PROTONIX ) 40 MG tablet Take 40 mg by mouth daily.   Yes [provider]  propranolol  (INDERAL ) 20 MG tablet Take 20 mg by mouth 3 (three) times daily. 03/29/20  Yes [provider]  rosuvastatin  (CRESTOR ) 20 MG tablet Take 20 mg by mouth daily. 10/15/22  Yes [provider]      Allergies    Escitalopram, Seroquel  [quetiapine  fumarate], and Trazodone    Review of Systems   Review  of Systems A 10 point review of systems was performed and is negative unless otherwise reported in HPI.  Physical Exam Updated Vital Signs BP (!) 128/95 (BP Location: Right Arm)   Pulse 96   Temp 97.8 F (36.6 C) (Oral)   Resp (!) 24   Ht 5\' 6"  (1.676 m)   Wt 86.1 kg   SpO2 100%   BMI 30.64 kg/m  Physical Exam General: Normal appearing female, lying in bed.  HEENT: NCAT, PERRLA, EOMI, Sclera anicteric, MMM, trachea midline.  Cardiology: RRR, no murmurs/rubs/gallops.  Resp: Normal respiratory rate and effort. CTAB, no wheezes, rhonchi, crackles.   intermittently very tachypneic but seems distractible.  Abd: Soft, non-tender, non-distended. No rebound tenderness or guarding.  GU: Deferred. MSK: No peripheral edema or signs of trauma. Extremities without deformity or TTP. No cyanosis or clubbing. Skin: warm, dry.  Neuro: A&Ox4, CNs II-XII grossly intact. MAEs. Sensation grossly intact. Normal speech.  Psych: Extremely anxious, seems frantic  ED Results / Procedures / Treatments   Labs (all labs ordered are listed, but only abnormal results are displayed) Labs Reviewed  URINALYSIS, ROUTINE W REFLEX MICROSCOPIC - Abnormal; Notable for the following components:      Result Value   APPearance HAZY (*)    All other components within normal limits  CBC WITH DIFFERENTIAL/PLATELET - Abnormal; Notable for the following components:   WBC 11.8 (*)    RBC 5.48 (*)    Hemoglobin 15.7 (*)    HCT 47.0 (*)    Platelets 407 (*)    Neutro Abs 8.0 (*)    All other components within normal limits  COMPREHENSIVE METABOLIC PANEL WITH GFR - Abnormal; Notable for the following components:   CO2 20 (*)    Glucose, Bld 117 (*)    All other components within normal limits  RAPID URINE DRUG SCREEN, HOSP PERFORMED - Abnormal; Notable for the following components:   Benzodiazepines POSITIVE (*)    Tetrahydrocannabinol POSITIVE (*)    All other components within normal limits  ETHANOL    EKG EKG Interpretation Date/Time:  Monday October 21 2023 16:59:30 EDT Ventricular Rate:  87 PR Interval:  114 QRS Duration:  74 QT Interval:  362 QTC Calculation: 435 R Axis:   1  Text Interpretation: Normal sinus rhythm T wave abnormality similar to prior Confirmed by Annita Kindle [phone]) on 10/21/2023 6:01:20 PM  Radiology CT Head Wo Contrast Result Date: 10/21/2023 CLINICAL DATA:  Head trauma, moderate-severe weak/dizzy, fell and hit head. EXAM: CT HEAD WITHOUT CONTRAST TECHNIQUE: Contiguous axial images were obtained from the base of the skull through the  vertex without intravenous contrast. RADIATION DOSE REDUCTION: This exam was performed according to the departmental dose-optimization program which includes automated exposure control, adjustment of the mA and/or kV according to patient size and/or use of iterative reconstruction technique. COMPARISON:  07/26/2023. FINDINGS: Brain: No evidence of acute infarction, hemorrhage, hydrocephalus, extra-axial collection or mass lesion/mass effect. Vascular: No hyperdense vessel or unexpected calcification. Skull: Normal. Negative for fracture or focal lesion. Sinuses/Orbits: No acute finding. IMPRESSION: No acute intracranial process. Electronically Signed   By: Sydell Eva M.D.   On: 10/21/2023 18:00    Procedures Procedures    Medications Ordered in ED Medications  LORazepam  (ATIVAN ) tablet 2 mg (2 mg Oral Given 10/21/23 1619)  ondansetron  (ZOFRAN ) injection 4 mg (4 mg Intravenous Given 10/21/23 1730)  lactated ringers  bolus 1,000 mL (0 mLs Intravenous Stopped 10/21/23 1859)    ED Course/ Medical Decision Making/ A&P  Medical Decision Making Amount and/or Complexity of Data Reviewed Labs: ordered. Decision-making details documented in ED Course. Radiology: ordered. Decision-making details documented in ED Course.  Risk Prescription drug management.    This patient presents to the ED for concern of anxiety, this involves an extensive number of treatment options, and is a complaint that carries with it a high risk of complications and morbidity.  I considered the following differential and admission for this acute, potentially life threatening condition.   MDM:    Patient with severe anxiety, depression symptoms. She has presented for this and been admitted inpatient multiple times. She has no signs of psychosis/mania. She is A&Ox4. She will need psychiatry consult.  Consider withdrawal from benzodiazepines causing her severe anxiety. Per PDMP review she was getting 28  day prescriptions for 56 0.5 mg clonazepam  tablets prescribed until April at which time this stopped, reportedly when she was no longer able to see her outpatient psychiatrist. She was prescribed ativan  last week and she is not sure if this helped.   For her weak/dizzy sensations, she has no electrolyte derangements, anemia, metabolic abnormalities, UTI, or EtOH. She is +THC/benzos. She has no FNDs or nystagmus to indicate acute vertigo or CVA. She did report a fall last night w/ head trauma, doesn't take any AC, but will obtain [facility] for further eval given age.    Clinical Course as of 10/21/23 2112  Mon Oct 21, 2023  1413 Urinalysis, Routine w reflex microscopic -Urine, Clean Catch(!) neg [HN]  1611 +Benzos, THC on UDS. Neg alcohol. Mild WBC but less than prior. CMP unremarkable.  [HN]  1806 CT Head Wo Contrast No acute intracranial process [HN]  1945 Currently chatting with psychiatry [HN]  1946 Psychiatry recommending inpatient admission for her. [HN]  2109 Patient reevaluated and states that she feels improved from a dizziness standpoint.  She said the fluids helped her.  She also endorses that she has not been eating or drinking very well and has had to force herself to eat.  Patient is already been seen psychiatry and recommending inpatient admission.  Patient is medically cleared for psychiatry admission. [HN]    Clinical Course User Index [HN] Merdis Stalling, MD    Labs: I Ordered, and personally interpreted labs.  The pertinent results include:  those listed above  Imaging Studies ordered: I ordered imaging studies including CTH I independently visualized and interpreted imaging. I agree with the radiologist interpretation  Additional history obtained from chart review.    Reevaluation: After the interventions noted above, I reevaluated the patient and found that they have :improved  Social Determinants of Health: Lives independently  Disposition:  [facility]  Co morbidities  that complicate the patient evaluation  Past Medical History:  Diagnosis Date   Anxiety    Bipolar 1 disorder (HCC)    Depression    OCD (obsessive compulsive disorder)    PTSD (post-traumatic stress disorder)    S/P ECT (electroconvulsive therapy)      Medicines Meds ordered this encounter  Medications   LORazepam  (ATIVAN ) tablet 2 mg   ondansetron  (ZOFRAN ) injection 4 mg   lactated ringers  bolus 1,000 mL    I have reviewed the patients home medicines and have made adjustments as needed  Problem List / ED Course: Problem List Items Addressed This Visit       Other   Generalized anxiety disorder - Primary   Other Visit Diagnoses       Generalized weakness  This note was created using dictation software, which may contain spelling or grammatical errors.    Merdis Stalling, MD 10/21/23 2112

## 2023-10-21 NOTE — ED Notes (Signed)
 Patient experiencing increased in anxiety at this time MD made aware

## 2023-10-21 NOTE — Progress Notes (Signed)
 Pt was accepted to [facility] 10/21/2023  Bed Assignment  Address: [address] [place], [place], [place] [address]  -[facility] Lucetta Russel Fax: [phone]  Pt meets inpatient criteria per  Attending Physician will be Dr. Romona Cobb  Report can be called to: [phone]  Pt can arrive after  Care Team notified:

## 2023-10-21 NOTE — BH Assessment (Signed)
 TTS Assessment has been deferred to [facility]. Secure chat initiated for scheduling and coordination.

## 2023-10-21 NOTE — ED Triage Notes (Signed)
 Pt arrived via [facility] from [facility]) for complaint of a panic attack. Per EMS, the facility called EMS to have the Pt removed from their facility. Pt frantically rambling about being locked up in a closet for 10 years and reports her daughter has been saying mean things to her. Pt reports her husband has her anxiety medication.

## 2023-10-21 NOTE — ED Notes (Signed)
 Lab called, they will run previous urine collection for newly added urine orders.

## 2023-10-21 NOTE — ED Notes (Addendum)
 TTS Assessment has been deferred to [facility], this nurse was just notified

## 2023-10-21 NOTE — ED Notes (Signed)
 Patient's husband went home. Took patient's purse and phone with him

## 2023-10-21 NOTE — ED Notes (Signed)
 TTS being completed at this time. ?

## 2023-10-21 NOTE — ED Notes (Signed)
 Telesitter camera activation confirmed

## 2023-10-21 NOTE — ED Notes (Deleted)
 Patient is calm waiting on security to arrive to wand her

## 2023-10-21 NOTE — ED Notes (Signed)
 Per Nadean August, MD : "I am recommending inpatient admission for her"
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
## 2023-10-21 NOTE — ED Notes (Signed)
 Patient wanded by security.

## 2023-10-21 NOTE — ED Notes (Signed)
 Patient taken to CT at this time.
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
## 2023-10-22 ENCOUNTER — Inpatient Hospital Stay
Admission: AD | Admit: 2023-10-22 | Discharge: 2023-11-01 | DRG: 885 | Disposition: A | Discharge status: Home / Self Care | Source: Intra-hospital | Attending: Psychiatry | Admitting: Psychiatry

## 2023-10-22 ENCOUNTER — Encounter: Payer: Self-pay | Admitting: Psychiatry

## 2023-10-22 DIAGNOSIS — F603 Borderline personality disorder: Secondary | ICD-10-CM | POA: Diagnosis present

## 2023-10-22 DIAGNOSIS — F314 Bipolar disorder, current episode depressed, severe, without psychotic features: Secondary | ICD-10-CM | POA: Diagnosis not present

## 2023-10-22 DIAGNOSIS — R45851 Suicidal ideations: Secondary | ICD-10-CM | POA: Diagnosis present

## 2023-10-22 DIAGNOSIS — F1320 Sedative, hypnotic or anxiolytic dependence, uncomplicated: Secondary | ICD-10-CM | POA: Diagnosis present

## 2023-10-22 DIAGNOSIS — Z79890 Hormone replacement therapy (postmenopausal): Secondary | ICD-10-CM | POA: Diagnosis not present

## 2023-10-22 DIAGNOSIS — Z62810 Personal history of physical and sexual abuse in childhood: Secondary | ICD-10-CM | POA: Diagnosis not present

## 2023-10-22 DIAGNOSIS — Z8719 Personal history of other diseases of the digestive system: Secondary | ICD-10-CM | POA: Diagnosis not present

## 2023-10-22 DIAGNOSIS — R682 Dry mouth, unspecified: Secondary | ICD-10-CM | POA: Diagnosis present

## 2023-10-22 DIAGNOSIS — F10239 Alcohol dependence with withdrawal, unspecified: Secondary | ICD-10-CM | POA: Diagnosis present

## 2023-10-22 DIAGNOSIS — Z818 Family history of other mental and behavioral disorders: Secondary | ICD-10-CM

## 2023-10-22 DIAGNOSIS — Z888 Allergy status to other drugs, medicaments and biological substances status: Secondary | ICD-10-CM | POA: Diagnosis not present

## 2023-10-22 DIAGNOSIS — F1220 Cannabis dependence, uncomplicated: Secondary | ICD-10-CM | POA: Diagnosis present

## 2023-10-22 DIAGNOSIS — F319 Bipolar disorder, unspecified: Secondary | ICD-10-CM | POA: Diagnosis present

## 2023-10-22 DIAGNOSIS — Z79899 Other long term (current) drug therapy: Secondary | ICD-10-CM

## 2023-10-22 DIAGNOSIS — F4312 Post-traumatic stress disorder, chronic: Secondary | ICD-10-CM | POA: Diagnosis present

## 2023-10-22 DIAGNOSIS — F411 Generalized anxiety disorder: Secondary | ICD-10-CM | POA: Diagnosis present

## 2023-10-22 DIAGNOSIS — F410 Panic disorder [episodic paroxysmal anxiety] without agoraphobia: Secondary | ICD-10-CM | POA: Diagnosis present

## 2023-10-22 DIAGNOSIS — F429 Obsessive-compulsive disorder, unspecified: Secondary | ICD-10-CM | POA: Diagnosis present

## 2023-10-22 DIAGNOSIS — R531 Weakness: Secondary | ICD-10-CM | POA: Diagnosis not present

## 2023-10-22 MED ORDER — MAGNESIUM HYDROXIDE 400 MG/5ML PO SUSP
30.0000 mL | Freq: Every day | ORAL | Status: DC | PRN
  Administered 2023-10-26 – 2023-10-27 (×2): 30 mL via ORAL
  Filled 2023-10-22 (×2): qty 30

## 2023-10-22 MED ORDER — LORAZEPAM 1 MG PO TABS
1.0000 mg | ORAL_TABLET | Freq: Four times a day (QID) | ORAL | Status: DC | PRN
  Administered 2023-10-22 – 2023-10-23 (×3): 1 mg via ORAL
  Filled 2023-10-22 (×3): qty 1

## 2023-10-22 MED ORDER — OLANZAPINE 5 MG PO TBDP
5.0000 mg | ORAL_TABLET | Freq: Three times a day (TID) | ORAL | Status: DC | PRN
  Administered 2023-10-22 – 2023-10-30 (×3): 5 mg via ORAL
  Filled 2023-10-22 (×3): qty 1

## 2023-10-22 MED ORDER — ALUM & MAG HYDROXIDE-SIMETH 200-200-20 MG/5ML PO SUSP: 30.0000 mL | ORAL | Status: DC | PRN

## 2023-10-22 NOTE — Progress Notes (Signed)
   10/22/23 1100  Psych Admission Type (Psych Patients Only)  Admission Status Voluntary  Psychosocial Assessment  Patient Complaints Anxiety;Depression;Crying spells  Eye Contact Fair  Facial Expression Pensive;Sullen;Anxious  Affect Sullen;Anxious  Speech Logical/coherent  Interaction Minimal  Motor Activity Slow  Appearance/Hygiene In scrubs  Behavior Characteristics Cooperative;Anxious  Mood Anxious  Thought Process  Coherency WDL  Content WDL  Delusions None reported or observed  Perception WDL  Hallucination None reported or observed  Judgment WDL  Confusion None  Danger to Self  Current suicidal ideation? Denies  Danger to Others  Danger to Others None reported or observed

## 2023-10-22 NOTE — ED Provider Notes (Signed)
 Emergency Medicine Observation Re-evaluation Note  Dawn Kelley is a 63 y.o. female, seen on rounds today.  Pt initially presented to the ED for complaints of Panic Attack Currently, the patient is asleep.  Pt presented to the ED with anxiety and panic attacks.  She has been unable to obtain psych care.  She has been accepted for inpatient treatment at [facility].  Physical Exam  BP (!) 107/48 (BP Location: Right Arm)   Pulse 76   Temp 98.1 F (36.7 C) (Oral)   Resp 15   Ht 5\' 6"  (1.676 m)   Wt 86.1 kg   SpO2 97%   BMI 30.64 kg/m  Physical Exam General: asleep Cardiac: rr Lungs: clear Psych: calm  ED Course / MDM  EKG:EKG Interpretation Date/Time:  Monday October 21 2023 16:59:30 EDT Ventricular Rate:  87 PR Interval:  114 QRS Duration:  74 QT Interval:  362 QTC Calculation: 435 R Axis:   1  Text Interpretation: Normal sinus rhythm T wave abnormality similar to prior Confirmed by Annita Kindle [phone]) on 10/21/2023 6:01:20 PM  I have reviewed the labs performed to date as well as medications administered while in observation.  Recent changes in the last 24 hours include inpatient acceptance.  Plan  Current plan is for tx to [facility].    Sueellen Emery, MD 10/22/23 0730

## 2023-10-22 NOTE — Plan of Care (Signed)
  Problem: Education: Goal: Ability to state activities that reduce stress will improve Outcome: Not Progressing   Problem: Coping: Goal: Ability to identify and develop effective coping behavior will improve Outcome: Not Progressing   Problem: Self-Concept: Goal: Ability to identify factors that promote anxiety will improve Outcome: Not Progressing Goal: Level of anxiety will decrease Outcome: Not Progressing Goal: Ability to modify response to factors that promote anxiety will improve Outcome: Not Progressing   

## 2023-10-22 NOTE — ED Notes (Signed)
 Pt received breakfast tray

## 2023-10-22 NOTE — ED Notes (Signed)
 Safe transport called to transport patient. Nurse aware.

## 2023-10-22 NOTE — Group Note (Signed)
 [facility] LCSW Group Therapy Note    Group Date: 10/22/2023 Start Time: 1300 End Time: 1330  Type of Therapy and Topic:  Group Therapy:  Overcoming Obstacles  Participation Level:  [facility] PARTICIPATION LEVEL: Did Not Attend  Mood:  Description of Group:   In this group patients will be encouraged to explore what they see as obstacles to their own wellness and recovery. They will be guided to discuss their thoughts, feelings, and behaviors related to these obstacles. The group will process together ways to cope with barriers, with attention given to specific choices patients can make. Each patient will be challenged to identify changes they are motivated to make in order to overcome their obstacles. This group will be process-oriented, with patients participating in exploration of their own experiences as well as giving and receiving support and challenge from other group members.  Therapeutic Goals: 1. Patient will identify personal and current obstacles as they relate to admission. 2. Patient will identify barriers that currently interfere with their wellness or overcoming obstacles.  3. Patient will identify feelings, thought process and behaviors related to these barriers. 4. Patient will identify two changes they are willing to make to overcome these obstacles:    Summary of Patient Progress   X   Therapeutic Modalities:   Cognitive Behavioral Therapy Solution Focused Therapy Motivational Interviewing Relapse Prevention Therapy   Claudio Culver, LCSWA

## 2023-10-22 NOTE — Group Note (Unsigned)
 LCSW Group Therapy Note   Group Date: 10/22/2023 Start Time: 1300 End Time: 1330   Type of Therapy and Topic:  Group Therapy:   Participation Level:  {[facility] PARTICIPATION NWGNF:62130}  Description of Group:   Therapeutic Goals:  1.     Summary of Patient Progress:    ***  Therapeutic Modalities:   Sharl Davies 10/22/2023  1:31 PM
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
## 2023-10-22 NOTE — Tx Team (Signed)
 Initial Treatment Plan 10/22/2023 1:15 PM Dawn Kelley [id number]:[id number]    PATIENT STRESSORS: Health problems     PATIENT STRENGTHS: Communication skills  Supportive family/friends    PATIENT IDENTIFIED PROBLEMS: Anxiety-medications                     DISCHARGE CRITERIA:  Ability to meet basic life and health needs Adequate post-discharge living arrangements Improved stabilization in mood, thinking, and/or behavior  PRELIMINARY DISCHARGE PLAN: Return to previous living arrangement  PATIENT/FAMILY INVOLVEMENT: This treatment plan has been presented to and reviewed with the patient, Dawn Kelley. The patient has been given the opportunity to ask questions and make suggestions.  Espiridion Heft, RN 10/22/2023, 1:15 PM

## 2023-10-22 NOTE — Group Note (Signed)
 Date:  10/22/2023 Time:  10:56 PM  Group Topic/Focus:  Wrap-Up Group:   The focus of this group is to help patients review their daily goal of treatment and discuss progress on daily workbooks.    Participation Level:  Did Not Attend  Participation Quality:     Affect:     Cognitive:     Insight: None  Engagement in Group:     Modes of Intervention:     Additional Comments:    Andreas Kays 10/22/2023, 10:56 PM

## 2023-10-22 NOTE — Group Note (Signed)
 Recreation Therapy Group Note   Group Topic:General Recreation  Group Date: 10/22/2023 Start Time: 1100 End Time: 1140 Facilitators: Deatrice Factor, LRT, CTRS Location: Courtyard  Group Description: Outdoor Recreation. Patients had the option to play corn hole, ring toss, bowling or listening to music while outside in the courtyard getting fresh air and sunlight. Patients helped water  and prune the raised garden beds. LRT and patients discussed things that they enjoy doing in their free time outside of the hospital. LRT encouraged patients to drink water  after being active and getting their heart rate up.   Goal Area(s) Addressed: Patient will identify leisure interests.  Patient will practice healthy decision making. Patient will engage in recreation activity.   Affect/Mood: Full range   Participation Level: Moderate    Clinical Observations/Individualized Feedback: Aireanna came late to group. Pt was smiling with peers and then crying the next saying that she did not want to be here. Pt complained of feel cold but sweating.   Plan: Continue to engage patient in RT group sessions 2-3x/week.   Deatrice Factor, LRT, CTRS 10/22/2023 1:51 PM

## 2023-10-22 NOTE — Group Note (Signed)
 Recreation Therapy Group Note   Group Topic:Leisure Education  Group Date: 10/22/2023 Start Time: 1500 End Time: 1510 Facilitators: Deatrice Factor, LRT, CTRS Location: Dayroom  Group Description: Leisure. Patients were given the option to choose from singing karaoke, coloring mandalas, using oil pastels, journaling, or playing with play-doh. LRT and pts discussed the meaning of leisure, the importance of participating in leisure during their free time/when they're outside of the hospital, as well as how our leisure interests can also serve as coping skills.   Goal Area(s) Addressed:  Patient will identify a current leisure interest.  Patient will learn the definition of "leisure". Patient will practice making a positive decision. Patient will have the opportunity to try a new leisure activity. Patient will communicate with peers and LRT.    Affect/Mood: N/A   Participation Level: Did not attend    Clinical Observations/Individualized Feedback: Patient did not attend group.   Plan: Continue to engage patient in RT group sessions 2-3x/week.   Deatrice Factor, LRT, CTRS 10/22/2023 5:05 PM

## 2023-10-23 DIAGNOSIS — F319 Bipolar disorder, unspecified: Secondary | ICD-10-CM | POA: Diagnosis not present

## 2023-10-23 MED ORDER — CHLORDIAZEPOXIDE HCL 25 MG PO CAPS
25.0000 mg | ORAL_CAPSULE | ORAL | Status: AC
Start: 2023-10-25 — End: 2023-10-26
  Administered 2023-10-25 – 2023-10-26 (×2): 25 mg via ORAL
  Filled 2023-10-23 (×2): qty 1

## 2023-10-23 MED ORDER — ROSUVASTATIN CALCIUM 10 MG PO TABS
20.0000 mg | ORAL_TABLET | Freq: Every day | ORAL | Status: DC
  Administered 2023-10-23 – 2023-11-01 (×9): 20 mg via ORAL
  Filled 2023-10-23 (×10): qty 2

## 2023-10-23 MED ORDER — CLONAZEPAM 0.5 MG PO TABS: 0.5000 mg | ORAL_TABLET | Freq: Two times a day (BID) | ORAL | Status: DC | PRN

## 2023-10-23 MED ORDER — ENSURE PLUS HIGH PROTEIN PO LIQD
237.0000 mL | Freq: Two times a day (BID) | ORAL | Status: DC
  Administered 2023-10-23 – 2023-10-29 (×11): 237 mL via ORAL

## 2023-10-23 MED ORDER — GABAPENTIN 300 MG PO CAPS
300.0000 mg | ORAL_CAPSULE | Freq: Two times a day (BID) | ORAL | Status: DC
  Administered 2023-10-23 – 2023-10-27 (×9): 300 mg via ORAL
  Filled 2023-10-23 (×9): qty 1

## 2023-10-23 MED ORDER — HYDROXYZINE HCL 50 MG PO TABS
50.0000 mg | ORAL_TABLET | Freq: Four times a day (QID) | ORAL | Status: DC | PRN
  Administered 2023-10-24 – 2023-11-01 (×12): 50 mg via ORAL
  Filled 2023-10-23 (×14): qty 1

## 2023-10-23 MED ORDER — CHLORDIAZEPOXIDE HCL 25 MG PO CAPS: 25.0000 mg | ORAL_CAPSULE | Freq: Four times a day (QID) | ORAL | Status: AC | PRN

## 2023-10-23 MED ORDER — CHLORDIAZEPOXIDE HCL 25 MG PO CAPS
25.0000 mg | ORAL_CAPSULE | Freq: Three times a day (TID) | ORAL | Status: AC
  Administered 2023-10-24 – 2023-10-25 (×3): 25 mg via ORAL
  Filled 2023-10-23 (×3): qty 1

## 2023-10-23 MED ORDER — ADULT MULTIVITAMIN W/MINERALS CH
1.0000 | ORAL_TABLET | Freq: Every day | ORAL | Status: DC
Start: 2023-10-23 — End: 2023-10-23

## 2023-10-23 MED ORDER — PANTOPRAZOLE SODIUM 40 MG PO TBEC
40.0000 mg | DELAYED_RELEASE_TABLET | Freq: Every day | ORAL | Status: DC
  Administered 2023-10-23 – 2023-11-01 (×9): 40 mg via ORAL
  Filled 2023-10-23 (×10): qty 1

## 2023-10-23 MED ORDER — CHLORDIAZEPOXIDE HCL 25 MG PO CAPS
25.0000 mg | ORAL_CAPSULE | Freq: Every day | ORAL | Status: AC
  Administered 2023-10-26: 25 mg via ORAL
  Filled 2023-10-23: qty 1

## 2023-10-23 MED ORDER — THIAMINE MONONITRATE 100 MG PO TABS
100.0000 mg | ORAL_TABLET | Freq: Once | ORAL | Status: AC
  Administered 2023-10-23: 100 mg via ORAL
  Filled 2023-10-23: qty 1

## 2023-10-23 MED ORDER — THIAMINE HCL 100 MG/ML IJ SOLN: 100.0000 mg | Freq: Once | INTRAMUSCULAR | Status: DC

## 2023-10-23 MED ORDER — ONDANSETRON 4 MG PO TBDP: 4.0000 mg | ORAL_TABLET | Freq: Four times a day (QID) | ORAL | Status: AC | PRN

## 2023-10-23 MED ORDER — BIOTENE DRY MOUTH MT LIQD
15.0000 mL | OROMUCOSAL | Status: DC | PRN
  Administered 2023-10-25: 15 mL via OROMUCOSAL
  Filled 2023-10-23: qty 473

## 2023-10-23 MED ORDER — BUSPIRONE HCL 5 MG PO TABS
15.0000 mg | ORAL_TABLET | Freq: Three times a day (TID) | ORAL | Status: DC
  Administered 2023-10-23 – 2023-11-01 (×25): 15 mg via ORAL
  Filled 2023-10-23 (×28): qty 3

## 2023-10-23 MED ORDER — HYDROXYZINE HCL 25 MG PO TABS
25.0000 mg | ORAL_TABLET | Freq: Four times a day (QID) | ORAL | Status: AC | PRN
  Administered 2023-10-23 – 2023-10-25 (×2): 25 mg via ORAL
  Filled 2023-10-23 (×2): qty 1

## 2023-10-23 MED ORDER — LOPERAMIDE HCL 2 MG PO CAPS: 2.0000 mg | ORAL_CAPSULE | ORAL | Status: AC | PRN

## 2023-10-23 MED ORDER — LEVOTHYROXINE SODIUM 25 MCG PO TABS
25.0000 ug | ORAL_TABLET | Freq: Every day | ORAL | Status: DC
  Administered 2023-10-24 – 2023-11-01 (×8): 25 ug via ORAL
  Filled 2023-10-23 (×9): qty 1

## 2023-10-23 MED ORDER — DULOXETINE HCL 60 MG PO CPEP
60.0000 mg | ORAL_CAPSULE | Freq: Every day | ORAL | Status: DC
  Administered 2023-10-23 – 2023-10-25 (×3): 60 mg via ORAL
  Filled 2023-10-23: qty 2
  Filled 2023-10-23 (×2): qty 1

## 2023-10-23 MED ORDER — CHLORDIAZEPOXIDE HCL 25 MG PO CAPS
25.0000 mg | ORAL_CAPSULE | Freq: Four times a day (QID) | ORAL | Status: AC
Start: 2023-10-23 — End: 2023-10-24
  Administered 2023-10-23 – 2023-10-24 (×4): 25 mg via ORAL
  Filled 2023-10-23 (×4): qty 1

## 2023-10-23 MED ORDER — LOSARTAN POTASSIUM 25 MG PO TABS
25.0000 mg | ORAL_TABLET | Freq: Every day | ORAL | Status: DC
  Administered 2023-10-23 – 2023-10-27 (×5): 25 mg via ORAL
  Filled 2023-10-23 (×6): qty 1

## 2023-10-23 MED ORDER — ADULT MULTIVITAMIN W/MINERALS CH
1.0000 | ORAL_TABLET | Freq: Every day | ORAL | Status: DC
  Administered 2023-10-23 – 2023-11-01 (×9): 1 via ORAL
  Filled 2023-10-23 (×10): qty 1

## 2023-10-23 NOTE — Group Note (Signed)
 Date:  10/23/2023 Time:  11:20 AM  Group Topic/Focus:  Outside Rec The Purpose of this group is to allow patients to go outside and get fresh air while doing outdoor activities and socializing with others     Participation Level:  Did Not Attend  Linnell Richardson 10/23/2023, 11:20 AM

## 2023-10-23 NOTE — Progress Notes (Signed)
   10/23/23 1100  Psych Admission Type (Psych Patients Only)  Admission Status Voluntary  Psychosocial Assessment  Patient Complaints Anxiety;None;Nervousness;Crying spells  Eye Contact Fair  Facial Expression Anxious;Worried  Affect Anxious;Preoccupied  Educational psychologist  Appearance/Hygiene In Environmental education officer Cooperative;Anxious  Mood Anxious;Preoccupied  Thought Process  Coherency WDL  Content WDL  Delusions None reported or observed  Perception WDL  Hallucination None reported or observed  Judgment WDL  Confusion None  Danger to Self  Current suicidal ideation? Denies  Danger to Others  Danger to Others None reported or observed

## 2023-10-23 NOTE — Progress Notes (Signed)
 Pt. rested in her bed throughout the shift only to come to the Nurse's station to request something for anxiety.  She asked why she didn't receive any medications at bedtime. This Clinical research associate sent a message to the NP regarding medication orders.  Will also update morning staff.  Pt. denied SI, HI, delusions, & hallucinations.  She reported having a lot of anxiety...nothing helps.  She was given PRN dose of Ativan  1mg  which provided relief.    While at the nurse's station she also mentioned having un reported vertigo.  This writer noticed a raised area on the left side of her forehead approximately quarter size in circumference.  She denied pain/discomfort.  She reported that she accidentally hit her head with her car door 3 months ago.  She was checked out at the ED in [facility].    She saw her PCP approximately 1 1/2 months ago, but did not mention the area on her head.  Nor did she bring up her vertigo.     Q59m Safety checks in place as per MD order.
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
## 2023-10-23 NOTE — BH IP Treatment Plan (Signed)
 Interdisciplinary Treatment and Diagnostic Plan Update  10/23/2023 Time of Session: 10:42 AM  Dawn Kelley MRN: [id number]  Principal Diagnosis: Bipolar affective disorder (HCC)  Secondary Diagnoses: Principal Problem:   Bipolar affective disorder (HCC)   Current Medications:  Current Facility-Administered Medications  Medication Dose Route Frequency Provider Last Rate Last Admin   alum & mag hydroxide-simeth (MAALOX/MYLANTA) 200-200-20 MG/5ML suspension 30 mL  30 mL Oral Q4H PRN McLauchlin, Angela, NP       busPIRone  (BUSPAR ) tablet 15 mg  15 mg Oral TID Jadapalle, Sree, MD       clonazePAM  (KLONOPIN ) tablet 0.5 mg  0.5 mg Oral BID PRN Jadapalle, Sree, MD       DULoxetine (CYMBALTA) DR capsule 60 mg  60 mg Oral Daily Jadapalle, Sree, MD       feeding supplement (ENSURE PLUS HIGH PROTEIN) liquid 237 mL  237 mL Oral BID BM Jadapalle, Sree, MD       gabapentin  (NEURONTIN ) capsule 300 mg  300 mg Oral BID Jadapalle, Sree, MD       hydrOXYzine  (ATARAX ) tablet 50 mg  50 mg Oral Q6H PRN Jadapalle, Sree, MD       [START ON 10/24/2023] levothyroxine  (SYNTHROID ) tablet 25 mcg  25 mcg Oral Q0600 Jadapalle, Sree, MD       losartan (COZAAR) tablet 25 mg  25 mg Oral Daily Jadapalle, Sree, MD       magnesium  hydroxide (MILK OF MAGNESIA) suspension 30 mL  30 mL Oral Daily PRN McLauchlin, Angela, NP       multivitamin with minerals tablet 1 tablet  1 tablet Oral Daily Jadapalle, Sree, MD       OLANZapine  zydis (ZYPREXA ) disintegrating tablet 5 mg  5 mg Oral TID PRN Jadapalle, Sree, MD   5 mg at 10/22/23 1141   pantoprazole  (PROTONIX ) EC tablet 40 mg  40 mg Oral Daily Jadapalle, Sree, MD       rosuvastatin  (CRESTOR ) tablet 20 mg  20 mg Oral Daily Jadapalle, Sree, MD       PTA Medications: Medications Prior to Admission  Medication Sig Dispense Refill Last Dose/Taking   busPIRone  (BUSPAR ) 15 MG tablet Take 15 mg by mouth 3 (three) times daily.      clonazePAM  (KLONOPIN ) 0.5 MG tablet Take 0.5 mg by  mouth 2 (two) times daily as needed for anxiety.      DULoxetine (CYMBALTA) 60 MG capsule Take 60 mg by mouth daily.      ibuprofen (ADVIL) 600 MG tablet Take 600 mg by mouth every 6 (six) hours as needed for mild pain (pain score 1-3) or moderate pain (pain score 4-6).      levothyroxine  (SYNTHROID ) 25 MCG tablet Take 25 mcg by mouth daily before breakfast.      LORazepam  (ATIVAN ) 1 MG tablet Take 1 tablet (1 mg total) by mouth 3 (three) times daily as needed for anxiety. 15 tablet 0    losartan (COZAAR) 25 MG tablet Take 25 mg by mouth daily.      pantoprazole  (PROTONIX ) 40 MG tablet Take 40 mg by mouth daily.      propranolol  (INDERAL ) 20 MG tablet Take 20 mg by mouth 3 (three) times daily.      rosuvastatin  (CRESTOR ) 20 MG tablet Take 20 mg by mouth daily.       Patient Stressors: Health problems    Patient Strengths: Manufacturing systems engineer  Supportive family/friends   Treatment Modalities: Medication Management, Group therapy, Case management,  1 to 1 session  with clinician, Psychoeducation, Recreational therapy.   Physician Treatment Plan for Primary Diagnosis: Bipolar affective disorder (HCC) Long Term Goal(s):     Short Term Goals:    Medication Management: Evaluate patient's response, side effects, and tolerance of medication regimen.  Therapeutic Interventions: 1 to 1 sessions, Unit Group sessions and Medication administration.  Evaluation of Outcomes: Not Progressing  Physician Treatment Plan for Secondary Diagnosis: Principal Problem:   Bipolar affective disorder (HCC)  Long Term Goal(s):     Short Term Goals:       Medication Management: Evaluate patient's response, side effects, and tolerance of medication regimen.  Therapeutic Interventions: 1 to 1 sessions, Unit Group sessions and Medication administration.  Evaluation of Outcomes: Not Progressing   RN Treatment Plan for Primary Diagnosis: Bipolar affective disorder (HCC) Long Term Goal(s): Knowledge of  disease and therapeutic regimen to maintain health will improve  Short Term Goals: Ability to remain free from injury will improve, Ability to verbalize frustration and anger appropriately will improve, Ability to demonstrate self-control, Ability to participate in decision making will improve, Ability to verbalize feelings will improve, Ability to disclose and discuss suicidal ideas, Ability to identify and develop effective coping behaviors will improve, and Compliance with prescribed medications will improve  Medication Management: RN will administer medications as ordered by provider, will assess and evaluate patient's response and provide education to patient for prescribed medication. RN will report any adverse and/or side effects to prescribing provider.  Therapeutic Interventions: 1 on 1 counseling sessions, Psychoeducation, Medication administration, Evaluate responses to treatment, Monitor vital signs and CBGs as ordered, Perform/monitor CIWA, COWS, AIMS and Fall Risk screenings as ordered, Perform wound care treatments as ordered.  Evaluation of Outcomes: Not Progressing   LCSW Treatment Plan for Primary Diagnosis: Bipolar affective disorder (HCC) Long Term Goal(s): Safe transition to appropriate next level of care at discharge, Engage patient in therapeutic group addressing interpersonal concerns.  Short Term Goals: Engage patient in aftercare planning with referrals and resources, Increase social support, Increase ability to appropriately verbalize feelings, Increase emotional regulation, Facilitate acceptance of mental health diagnosis and concerns, Facilitate patient progression through stages of change regarding substance use diagnoses and concerns, Identify triggers associated with mental health/substance abuse issues, and Increase skills for wellness and recovery  Therapeutic Interventions: Assess for all discharge needs, 1 to 1 time with Social worker, Explore available resources  and support systems, Assess for adequacy in community support network, Educate family and significant other(s) on suicide prevention, Complete Psychosocial Assessment, Interpersonal group therapy.  Evaluation of Outcomes: Not Progressing   Progress in Treatment: Attending groups: No. Participating in groups: No. Taking medication as prescribed: Yes. Toleration medication: Yes. Family/Significant other contact made: No, will contact:  CSW will contact if given permission  Patient understands diagnosis: No. Discussing patient identified problems/goals with staff: No. Medical problems stabilized or resolved: Yes. Denies suicidal/homicidal ideation: No. Issues/concerns per patient self-inventory: No. Other: None   New problem(s) identified: No, Describe:  none identified   New Short Term/Long Term Goal(s):  elimination of symptoms of psychosis, medication management for mood stabilization; elimination of SI thoughts; development of comprehensive mental wellness plan.   Patient Goals:   I want to go home, severe depression, severe anxiety. I wish God would take me in my sleep  Discharge Plan or Barriers: CSW to assist with appropriate discharge planning   Reason for Continuation of Hospitalization: Anxiety Depression Medication stabilization Suicidal ideation  Estimated Length of Stay: 1 to 7 days   Last 3  [place] Suicide Severity Risk Score: Flowsheet Row Admission (Current) from 10/22/2023 in [facility] ED from 10/21/2023 in [facility] Emergency Department at [facility] ED from 10/14/2023 in [facility] Emergency Department at [facility]  C-SSRS RISK CATEGORY No Risk No Risk No Risk       Last [facility] 2/9 Scores:    10/01/2023    4:19 PM 11/27/2022   10:15 AM 11/19/2022    1:42 PM  Depression screen PHQ 2/9  Decreased Interest 3 3 3   Down, Depressed, Hopeless 3 3   PHQ - 2 Score 6 6 3   Altered sleeping 3 2 2   Tired, decreased energy 0 3  3  Change in appetite 1 3 3   Feeling bad or failure about yourself  0 3 3  Trouble concentrating 0 3 3  Moving slowly or fidgety/restless 0 2 0  Suicidal thoughts 0 0 0  PHQ-9 Score 10 22 17   Difficult doing work/chores Very difficult Very difficult Somewhat difficult    Scribe for Treatment Team: Claudio Culver, Milinda Allen 10/23/2023 11:31 AM

## 2023-10-23 NOTE — Progress Notes (Signed)
  Chaplain On-Call responded to Spiritual Care Consult Order from Aurelia Blotter, MD. The request was for prayer with the patient.  Chaplain visited with the patient in her room, and she described instantly her overwhelming feelings of anxiety and depression. She is attempting to manage her emotions outwardly, which prompts much tearfulness for her.  Chaplain provided spiritual and emotional support, and prayer for assurance through her faith.  Chaplain debriefed the visit afterwards with RN Evone Hoh, and assured Staff of availability of Chaplains as needed.  Chaplain Dean Every., [facility]

## 2023-10-23 NOTE — Progress Notes (Signed)
   10/23/23 0500  Psych Admission Type (Psych Patients Only)  Admission Status Voluntary  Psychosocial Assessment  Patient Complaints Anxiety;Worrying  Eye Contact Fair  Facial Expression Anxious  Affect Anxious  Speech Logical/coherent  Interaction Minimal  Motor Activity Slow  Appearance/Hygiene In scrubs  Thought Process  Coherency WDL  Content WDL  Delusions None reported or observed  Perception WDL  Hallucination None reported or observed  Judgment WDL  Confusion None  Danger to Self  Current suicidal ideation? Denies  Danger to Others  Danger to Others None reported or observed

## 2023-10-23 NOTE — Group Note (Signed)
 Physical/Occupational Therapy Group Note  Group Topic: Functional, Dynamic Balance   Group Date: 10/23/2023 Start Time: 1300 End Time: 1335 Facilitators: Vonda Harth, Otelia Blew, PT   Group Description: Group discussed impact of balance on safety and independence with functional tasks.  Identified and discussed any self-perceived balance deficits to personalize information.  Discussed and reviewed strategies to address/improve balance deficits: use of assist devices, activity pacing/energy conservation, environment/home safety modifications, focusing attention/minimizing distraction.  Reviewed and participated with standing LE therex designed to target dynamic balance reactions and LE strength/stability; provided handouts with HEP to be utilized outside of group time as appropriate.  Allowed time for questions and further discussion on any balance or mobility concerns/needs.  Therapeutic Goal(s):  Identify and discuss any individual balance deficits and functional implications. Identify and discuss any environmental/home safety modifications that can optimize balance and safety for mobility within the home. Demonstrate understanding and performance of standing therex designed to target dynamic balance deficits.  Individual Participation: Did not attend  Participation Level:   Participation Quality:   Behavior:   Speech/Thought Process:   Affect/Mood:   Insight:   Judgement:   Modes of Intervention:   Patient Response to Interventions:    Plan: Continue to engage patient in PT/OT groups 1 - 2x/week.  Lavenia Post PT, DPT 10/23/23, 2:09 PM

## 2023-10-23 NOTE — BHH Suicide Risk Assessment (Signed)
 [facility] Admission Suicide Risk Assessment   Nursing information obtained from:  Patient Demographic factors:  NA Current Mental Status:  NA Loss Factors:  NA Historical Factors:  NA Risk Reduction Factors:  Living with another person, especially a relative, Positive therapeutic relationship  Total Time spent with patient: 30 minutes Principal Problem: Bipolar affective disorder (HCC) Diagnosis:  Principal Problem:   Bipolar affective disorder (HCC)  Subjective Data:  patient   Is a 63 year old female, with borderline personality disorder, benzodiazepine dependence and bipolar disorder per chart who was brought in by EMS after she was asked to be removed from the building she was in due to bizarre behavior.  Patient is noted to be extremely anxious, making passive suicidal statements of not wanting to wake up after sleep.  Husband reports overwhelmed with patient's anxiety.  Patient was admitted to geropsych unit for further stabilization.  Continued Clinical Symptoms:  Alcohol Use Disorder Identification Test Final Score (AUDIT): 0 The Alcohol Use Disorders Identification Test, Guidelines for Use in Primary Care, Second Edition.  World Science writer [facility]). Score between 0-7:  no or low risk or alcohol related problems. Score between 8-15:  moderate risk of alcohol related problems. Score between 16-19:  high risk of alcohol related problems. Score 20 or above:  warrants further diagnostic evaluation for alcohol dependence and treatment.   CLINICAL FACTORS:   Severe Anxiety and/or Agitation   Musculoskeletal: Strength & Muscle Tone: within normal limits Gait & Station: normal Patient leans: N/A  Psychiatric Specialty Exam:  Presentation  General Appearance:  Bizarre  Eye Contact: Fair  Speech: Normal Rate  Speech Volume: Normal  Handedness: Right   Mood and Affect  Mood: Anxious; Dysphoric  Affect: Tearful; Depressed   Thought Process  Thought  Processes: Irrevelant  Descriptions of Associations:Intact  Orientation:Full (Time, Place and Person)  Thought Content:Illogical; Perseveration  History of Schizophrenia/Schizoaffective disorder:No data recorded Duration of Psychotic Symptoms:No data recorded Hallucinations:Hallucinations: None  Ideas of Reference:None  Suicidal Thoughts:Suicidal Thoughts: Yes, Passive SI Passive Intent and/or Plan: Without Intent; Without Plan  Homicidal Thoughts:Homicidal Thoughts: No   Sensorium  Memory: Immediate Fair; Recent Fair; Remote Fair  Judgment: Impaired  Insight: Shallow   Executive Functions  Concentration: Poor  Attention Span: Poor  Recall: [employer] of Knowledge: Fair  Language: Fair   Psychomotor Activity  Psychomotor Activity: Psychomotor Activity: Restlessness   Assets  Assets: Communication Skills; Desire for Improvement; Housing; Social Support; Resilience   Sleep  Sleep: Sleep: Poor    Physical Exam: Physical Exam ROS Blood pressure 137/81, pulse (!) 114, temperature (!) 97.2 F (36.2 C), resp. rate 16, height 5' 6 (1.676 m), weight 79.8 kg, SpO2 99%. Body mass index is 28.41 kg/m.   COGNITIVE FEATURES THAT CONTRIBUTE TO RISK:  None    SUICIDE RISK:   Mild:  Suicidal ideation of limited frequency, intensity, duration, and specificity.  There are no identifiable plans, no associated intent, mild dysphoria and related symptoms, good self-control (both objective and subjective assessment), few other risk factors, and identifiable protective factors, including available and accessible social support.  PLAN OF CARE: Patient is admitted to [facility] psych unit with Q15 min safety monitoring. Multidisciplinary team approach is offered. Medication management; group/milieu therapy is offered.   I certify that inpatient services furnished can reasonably be expected to improve the patient's condition.   Aurelia Blotter, MD 10/23/2023, 1:25  PM

## 2023-10-23 NOTE — Plan of Care (Signed)
  Problem: Self-Concept: Goal: Ability to identify factors that promote anxiety will improve Outcome: Progressing Goal: Level of anxiety will decrease Outcome: Progressing Goal: Ability to modify response to factors that promote anxiety will improve Outcome: Progressing   

## 2023-10-23 NOTE — Progress Notes (Signed)
 NUTRITION ASSESSMENT  Pt identified as at risk on the Malnutrition Screen Tool  INTERVENTION:  -Continue regular diet -MVI with minerals daily -Ensure Plus High Protein po BID, each supplement provides 350 kcal and 20 grams of protein  -Magic cup BID with meals, each supplement provides 290 kcal and 9 grams of protein   NUTRITION DIAGNOSIS: Unintentional weight loss related to sub-optimal intake as evidenced by pt report.   Goal: Pt to meet >/= 90% of their estimated nutrition needs.  Monitor:  PO intake  Assessment:   Pt admitted with anxiety and panic attacks.   PTA, pt unable to obtain psychiatric care.   Pt currently on a regular diet. No meal completion data available to assess at this time.   Reviewed wt hx; pt has experienced a 12% wt loss over the past 3 months, which is significant for time frame. Pt is at high risk for malnutrition, however, unable to identify at this time. Pt would greatly benefit from addition of oral nutrition supplements.   Medications reviewed.   Lab Results  Component Value Date   HGBA1C 5.7 (H) 11/27/2022   PTA DM medications are none.   Labs reviewed: CBGS: 137 (inpatient orders for glycemic control are ).  Tox screen positive for THC and benzodiazepines.    63 y.o. female  Height: Ht Readings from Last 1 Encounters:  10/22/23 5' 6 (1.676 m)    Weight: Wt Readings from Last 1 Encounters:  10/22/23 79.8 kg    Weight Hx: Wt Readings from Last 10 Encounters:  10/22/23 79.8 kg  10/21/23 86.1 kg  10/14/23 86.2 kg  10/01/23 86.2 kg  07/25/23 90.7 kg  07/24/23 91.6 kg  07/22/23 91.6 kg  07/17/23 91.6 kg  11/27/22 91.6 kg  10/23/22 90.7 kg    BMI:  Body mass index is 28.41 kg/m. Pt meets criteria for overweight based on current BMI.  Estimated Nutritional Needs: Kcal: 25-30 kcal/kg Protein: > 1 gram protein/kg Fluid: 1 ml/kcal  Diet Order:  Diet Order             Diet regular Room service appropriate? Yes; Fluid  consistency: Thin  Diet effective now                  Pt is also offered choice of unit snacks mid-morning and mid-afternoon.  Pt is eating as desired.   Lab results and medications reviewed.   Herschel Lords, RD, LDN, CDCES Registered Dietitian III Certified Diabetes Care and Education Specialist If unable to reach this RD, please use RD Inpatient group chat on secure chat between hours of 8am-4 pm daily

## 2023-10-23 NOTE — Group Note (Signed)
 Date:  10/23/2023 Time:  9:51 PM  Group Topic/Focus:  Wrap-Up Group:   The focus of this group is to help patients review their daily goal of treatment and discuss progress on daily workbooks.    Participation Level:  Active  Participation Quality:  Appropriate  Affect:  Appropriate  Cognitive:  Appropriate  Insight: Appropriate  Engagement in Group:  Engaged  Modes of Intervention:  Discussion   Bradley Caffey 10/23/2023, 9:51 PM

## 2023-10-24 DIAGNOSIS — F314 Bipolar disorder, current episode depressed, severe, without psychotic features: Secondary | ICD-10-CM

## 2023-10-24 NOTE — BHH Counselor (Signed)
 CSW contacted [facility].   CSW spoke to [place] who reports that pt cannot receive ACT services because she is diagnosed with BPD.   Lynder Sanger reports pt may qualify for CST team but not ACT.   Lynder Sanger provided the following information:   Jenelle Mis [phone]) for a possible CST services.   CSW will inform care team and provider.   Derrill Flirt, MSW, [place] 10/24/2023 10:55 AM

## 2023-10-24 NOTE — Progress Notes (Signed)
 Patient cooperative.  Anxious affect.  Endorses anxiety and depression.  Denies SI/HI and AVH. Denies pain. Reports fair sleep.    Compliant with scheduled medications.  PRN medication given for anxiety.  Patient is present in the milieu sporadically.  Multiple crying spells observed throughout the shift in milieu and at nurse's station.  Attention seeking behavior.

## 2023-10-24 NOTE — Progress Notes (Signed)
 [facility] MD Progress Note  10/24/2023 12:16 PM Dawn Kelley  MRN:  [id number] patient   Is a 63 year old female, with borderline personality disorder, benzodiazepine dependence and bipolar disorder per chart who was brought in by EMS after she was asked to be removed from the building she was in due to bizarre behavior. Patient is noted to be extremely anxious, making passive suicidal statements of not wanting to wake up after sleep. Husband reports overwhelmed with patient's anxiety. Patient was admitted to geropsych unit for further stabilization.   Subjective:  Chart reviewed, case discussed in multidisciplinary meeting, patient seen during rounds.  Patient was initially seen at the nurses station, on standing around the nurses station.  She is then noted to be walking into her room quietly.  Provider followed her into the room and saw the patient laying down in bed.  When provider greeted her she started crying which shaky voice saying she is severely depressed and she needs help.  Provider educated her on the medication adjustments that are done and encouraged the patient to attend the groups.  Patient keeps repeating that she is depressed and she cannot help herself.  Provider educated her that the room will be locked to allow the patient to be part of the groups.  Provider also discussed the possibility of ECT and the benefit in treatment resistant depression as patient has been hospitalized multiple times and being on multiple psychotropic medications.  Patient got fixated on ECT saying she does not want ECT.  Provider educated that this is acute care facility and given the chronicity of her depression and anxiety and addiction to benzos and alcohol patient will need intensive outpatient services after crisis stabilization on the inpatient unit.  She denies suicidal/homicidal ideation/plan.  She denies auditory/visual hallucinations.  Patient is noted to be calm and engaging in conversation in a normal way  with her peers but when she talks to the staff she is noted to be sobbing which shaky voice.   Sleep: Poor  Appetite:  Fair  Past Psychiatric History: see h&P Family History:  Family History  Problem Relation Age of Onset   Depression Mother    Pneumonia Mother    Social History:  Social History   Substance and Sexual Activity  Alcohol Use Yes   Alcohol/week: 10.0 standard drinks of alcohol   Types: 10 Cans of beer per week   Comment: daily, last drank last night     Social History   Substance and Sexual Activity  Drug Use Yes   Types: Marijuana   Comment: daily    Social History   Socioeconomic History   Marital status: Married    Spouse name: Not on file   Number of children: Not on file   Years of education: Not on file   Highest education level: Not on file  Occupational History   Occupation: Disabled  Tobacco Use   Smoking status: Never    Passive exposure: Current   Smokeless tobacco: Never  Vaping Use   Vaping status: Never Used  Substance and Sexual Activity   Alcohol use: Yes    Alcohol/week: 10.0 standard drinks of alcohol    Types: 10 Cans of beer per week    Comment: daily, last drank last night   Drug use: Yes    Types: Marijuana    Comment: daily   Sexual activity: Not Currently    Birth control/protection: Post-menopausal  Other Topics Concern   Not on file  Social History Narrative  Pt lives in [place] with husband.  She is on disability.  Pt stated that she receives outpatient psychiatry services through [facility].  She does not receive outpatient therapy.   Social Drivers of Corporate investment banker Strain: Low Risk  (10/23/2022)   Overall Financial Resource Strain (CARDIA)    Difficulty of Paying Living Expenses: Not hard at all  Food Insecurity: No Food Insecurity (10/22/2023)   Hunger Vital Sign    Worried About Running Out of Food in the Last Year: Never true    Ran Out of Food in the Last Year: Never true   Transportation Needs: No Transportation Needs (10/22/2023)   PRAPARE - Administrator, Civil Service (Medical): No    Lack of Transportation (Non-Medical): No  Physical Activity: Insufficiently Active (10/23/2022)   Exercise Vital Sign    Days of Exercise per Week: 2 days    Minutes of Exercise per Session: 10 min  Stress: No Stress Concern Present (10/23/2022)   [employer] of Occupational Health - Occupational Stress Questionnaire    Feeling of Stress : Not at all  Social Connections: Moderately Isolated (10/23/2022)   Social Connection and Isolation Panel    Frequency of Communication with Friends and Family: More than three times a week    Frequency of Social Gatherings with Friends and Family: More than three times a week    Attends Religious Services: Never    Database administrator or Organizations: No    Attends Engineer, structural: Never    Marital Status: Married   Past Medical History:  Past Medical History:  Diagnosis Date   Anxiety    Bipolar 1 disorder (HCC)    Depression    OCD (obsessive compulsive disorder)    PTSD (post-traumatic stress disorder)    S/P ECT (electroconvulsive therapy)     Past Surgical History:  Procedure Laterality Date   BIOPSY  02/03/2020   Procedure: BIOPSY;  Surgeon: Urban Garden, MD;  Location: [facility];  Service: Gastroenterology;;   COLONOSCOPY WITH PROPOFOL  N/A 02/03/2020   [name]: 3mm polyp in sigmoid colon  as well as hemorrhoids, histology consistent with tubular adenoma   ESOPHAGOGASTRODUODENOSCOPY (EGD) WITH PROPOFOL  N/A 02/03/2020   castaneda: small gastric polyps, sessile, and fundic gland, small bowel bx normal   INDUCED ABORTION N/A 1980   POLYPECTOMY  02/03/2020   Procedure: POLYPECTOMY;  Surgeon: Umberto Ganong, Bearl Limes, MD;  Location: [facility];  Service: Gastroenterology;;   VEIN SURGERY      Current Medications: Current Facility-Administered Medications   Medication Dose Route Frequency Provider Last Rate Last Admin   alum & mag hydroxide-simeth (MAALOX/MYLANTA) 200-200-20 MG/5ML suspension 30 mL  30 mL Oral Q4H PRN McLauchlin, Angela, NP       antiseptic oral rinse (BIOTENE) solution 15 mL  15 mL Mouth Rinse PRN Aurelia Blotter, MD       busPIRone  (BUSPAR ) tablet 15 mg  15 mg Oral TID Finnian Husted, MD   15 mg at 10/24/23 0912   chlordiazePOXIDE (LIBRIUM) capsule 25 mg  25 mg Oral Q6H PRN Adysson Revelle, MD       chlordiazePOXIDE (LIBRIUM) capsule 25 mg  25 mg Oral TID Keisha Amer, MD       Followed by   Cecily Cohen ON 10/25/2023] chlordiazePOXIDE (LIBRIUM) capsule 25 mg  25 mg Oral Ardelle Kos, MD       Followed by   Cecily Cohen ON 10/26/2023] chlordiazePOXIDE (LIBRIUM) capsule 25 mg  25 mg Oral Daily Maisha Bogen, MD       DULoxetine (CYMBALTA) DR capsule 60 mg  60 mg Oral Daily Jiyaan Steinhauser, MD   60 mg at 10/24/23 0912   feeding supplement (ENSURE PLUS HIGH PROTEIN) liquid 237 mL  237 mL Oral BID BM Kenyan Karnes, MD   237 mL at 10/24/23 1610   gabapentin  (NEURONTIN ) capsule 300 mg  300 mg Oral BID Shady Padron, MD   300 mg at 10/24/23 0913   hydrOXYzine  (ATARAX ) tablet 25 mg  25 mg Oral Q6H PRN Micca Matura, MD   25 mg at 10/23/23 1726   hydrOXYzine  (ATARAX ) tablet 50 mg  50 mg Oral Q6H PRN Walker Sitar, MD   50 mg at 10/24/23 1127   levothyroxine  (SYNTHROID ) tablet 25 mcg  25 mcg Oral Q0600 Mischele Detter, MD   25 mcg at 10/24/23 0555   loperamide (IMODIUM) capsule 2-4 mg  2-4 mg Oral PRN Kenasia Scheller, MD       losartan (COZAAR) tablet 25 mg  25 mg Oral Daily Acelin Ferdig, MD   25 mg at 10/24/23 9604   magnesium  hydroxide (MILK OF MAGNESIA) suspension 30 mL  30 mL Oral Daily PRN McLauchlin, Angela, NP       multivitamin with minerals tablet 1 tablet  1 tablet Oral Daily Jag Lenz, MD   1 tablet at 10/24/23 0912   OLANZapine  zydis (ZYPREXA ) disintegrating tablet 5 mg  5 mg Oral TID PRN Tymel Conely,  MD   5 mg at 10/22/23 1141   ondansetron  (ZOFRAN -ODT) disintegrating tablet 4 mg  4 mg Oral Q6H PRN Yatzary Merriweather, MD       pantoprazole  (PROTONIX ) EC tablet 40 mg  40 mg Oral Daily Daemon Dowty, MD   40 mg at 10/24/23 5409   rosuvastatin  (CRESTOR ) tablet 20 mg  20 mg Oral Daily Cherilynn Schomburg, MD   20 mg at 10/24/23 8119    Lab Results: No results found for this or any previous visit (from the past 48 hours).  Blood Alcohol level:  Lab Results  Component Value Date   [facility] <15 10/21/2023   ETH <15 10/14/2023    Metabolic Disorder Labs: Lab Results  Component Value Date   HGBA1C 5.7 (H) 11/27/2022   No results found for: PROLACTIN No results found for: CHOL, TRIG, HDL, CHOLHDL, VLDL, LDLCALC  Physical Findings: AIMS:  , ,  ,  ,    CIWA:  CIWA-Ar Total: 4 COWS:      Psychiatric Specialty Exam:  Presentation  General Appearance:  Casual  Eye Contact: Fleeting  Speech: Normal Rate  Speech Volume: Normal    Mood and Affect  Mood: Anxious  Affect: Inappropriate   Thought Process  Thought Processes: Irrevelant  Descriptions of Associations:Intact  Orientation:Full (Time, Place and Person)  Thought Content:Illogical  Hallucinations:Hallucinations: None  Ideas of Reference:None  Suicidal Thoughts:denies Homicidal Thoughts:Homicidal Thoughts: No   Sensorium  Memory: Immediate Fair; Recent Fair; Remote Fair  Judgment: Impaired  Insight: Shallow   Executive Functions  Concentration: Poor  Attention Span: Poor  Recall: [employer] of Knowledge: Fair  Language: Fair   Psychomotor Activity  Psychomotor Activity: Psychomotor Activity: Normal  Musculoskeletal: Strength & Muscle Tone: within normal limits Gait & Station: normal Assets  Assets: Manufacturing systems engineer; Desire for Improvement; Social Support; Resilience    Physical Exam: Physical Exam ROS Blood pressure (!) 150/77, pulse 98, temperature  97.6 F (36.4 C), resp. rate 20, height 5' 6 (1.676 m),  weight 79.8 kg, SpO2 99%. Body mass index is 28.41 kg/m.  Diagnosis: Principal Problem:   Bipolar affective disorder (HCC) Generalized anxiety disorder with panic attacks Borderline personality disorder PTSD, chronic Cannabis use disorder, moderate Alcohol use disorder, moderate, currently in withdrawal   Clinical Decision Making: Patient with history of borderline personality, depression and anxiety, recent increasing use of alcohol and history of cannabis use presented to emergency room with worsening anxiety, mood lability and bizarre behavior.  Patient is currently admitted to geropsych unit.  Patient is endorsing passive suicidal ideation with no intent or plan.  Patient is noted to be withdrawing from alcohol and displays severe anxiety.  She needs ongoing inpatient hospitalization.   Treatment Plan Summary:   Safety and Monitoring:             -- Voluntary admission to inpatient psychiatric unit for safety, stabilization and treatment             -- Daily contact with patient to assess and evaluate symptoms and progress in treatment             -- Patient's case to be discussed in multi-disciplinary team meeting             -- Observation Level: q15 minute checks             -- Vital signs:  q12 hours             -- Precautions: suicide, elopement, and assault   2. Psychiatric Diagnoses and Treatment:               Continue Cymbalta 60 mg daily-plan to titrate it up  gabapentin  300 mg twice daily Given the history of alcohol use-CIWA protocol was initiated and Librium taper is initiated Patient's husband reports Klonopin  has been discontinued by the previous provider few months ago. Continue BuSpar  15 mg 3 times daily   -- The risks/benefits/side-effects/alternatives to this medication were discussed in detail with the patient and time was given for questions. The patient consents to medication trial.                --  Metabolic profile and EKG monitoring obtained while on an atypical antipsychotic (BMI: Lipid Panel: HbgA1c: QTc:)              -- Encouraged patient to participate in unit milieu and in scheduled group therapies                            3. Medical Issues Being Addressed:  CIWA protocol to monitor alcohol withdrawal with librium taper    4. Discharge Planning:   -- Social work and case management to assist with discharge planning and identification of hospital follow-up needs prior to discharge  -- Estimated LOS: 3-4 days  Aurelia Blotter, MD 10/24/2023, 12:16 PM

## 2023-10-24 NOTE — Progress Notes (Signed)
   10/24/23 2100  Psych Admission Type (Psych Patients Only)  Admission Status Voluntary  Psychosocial Assessment  Patient Complaints Anxiety;Depression;Crying spells  Eye Contact Fair  Facial Expression Anxious;Worried  Affect Anxious;Fearful  Speech Logical/coherent  Interaction Assertive  Motor Activity Slow  Appearance/Hygiene In scrubs  Behavior Characteristics Anxious;Cooperative  Mood Anxious;Preoccupied  Thought Process  Coherency WDL  Content Preoccupation  Delusions None reported or observed  Perception WDL  Hallucination None reported or observed  Judgment WDL  Confusion None  Danger to Self  Current suicidal ideation? Denies  Danger to Others  Danger to Others None reported or observed

## 2023-10-24 NOTE — Plan of Care (Signed)
Problem: Anxiety Goal: STG - Patient will identify 3 new relaxation techniques to better manage anxiety within 5 recreation therapy group sessions Description: STG - Patient will identify 3 new relaxation techniques to better manage anxiety within 5 recreation therapy group sessions Outcome: Not Applicable

## 2023-10-24 NOTE — Group Note (Signed)
 Recreation Therapy Group Note   Group Topic:Health and Wellness  Group Date: 10/24/2023 Start Time: 1100 End Time: 1135 Facilitators: Deatrice Factor, LRT, CTRS Location: Courtyard  Group Description: Outdoor Recreation. Patients had the option to play corn hole, ring toss, bowling or listening to music while outside in the courtyard getting fresh air and sunlight. Patients helped water  and prune the raised garden beds. LRT and patients discussed things that they enjoy doing in their free time outside of the hospital. LRT encouraged patients to drink water  after being active and getting their heart rate up.   Goal Area(s) Addressed: Patient will identify leisure interests.  Patient will practice healthy decision making. Patient will engage in recreation activity.   Affect/Mood: Full range   Participation Level: Moderate    Clinical Observations/Individualized Feedback: Dawn Kelley came late to group and shared that she is only in group so she doesn't get locked out of her room. Pt was tearful one minute and crying the next.   Plan: Continue to engage patient in RT group sessions 2-3x/week.   Deatrice Factor, LRT, CTRS 10/24/2023 2:33 PM

## 2023-10-24 NOTE — Group Note (Signed)
 Recreation Therapy Group Note   Group Topic:Emotion Expression  Group Date: 10/24/2023 Start Time: 1500 End Time: 1605 Facilitators: Yvonna Herder, CTRS Location: Craft Room  Group Description: Painting a Diplomatic Services operational officer. Patients and LRT discuss what it means to be "at peace", what it feels like physically and mentally. Pts are given a canvas and watercolor paint to use and encouraged to draw their idea of a peaceful place. Pts and LRT discuss how they use this in their daily life post discharge. Pts are encouraged to take their canvas home with them as a reminder to find their peaceful place whenever they are feeling depressed, anxious, etc.    Goal Area(s) Addressed:  Patient will identify what it means to experience a "peaceful" emotion. Patient will identify a new coping skill.  Patient will express their emotions through art. Patients will increase communication by talking with LRT and peers while in group.   Affect/Mood: Appropriate   Participation Level: Active and Engaged   Participation Quality: Independent   Behavior: Appropriate, Calm, and Cooperative   Speech/Thought Process: Coherent   Insight: Good   Judgement: Good   Modes of Intervention: Art   Patient Response to Interventions:  Attentive, Engaged, Interested , and Receptive   Education Outcome:  Acknowledges education   Clinical Observations/Individualized Feedback: Dawn Kelley was active in their participation of session activities and group discussion. Pt identified being at home with my husband and dog as her peaceful place. Pt painted appropriately. Pt interacted well with LRT and peers duration of session.    Plan: Continue to engage patient in RT group sessions 2-3x/week.   Deatrice Factor, LRT, CTRS 10/24/2023 4:38 PM

## 2023-10-24 NOTE — Progress Notes (Signed)
 Random carrying spells and anxious with milt UE tremors.  Pt walked away twice from her husband during his visit crying; also noted crying while he held and supported her.  Pt reported being on psych medication for a long time and that the medication is not working.  Rated anxiety 8/10 depression 5/10.  Pt's husband took her belongings that was in storage. Pt took all HS medications.  Pt denied SI/HI and AVH.    10/23/23 2200  Psych Admission Type (Psych Patients Only)  Admission Status Voluntary  Psychosocial Assessment  Patient Complaints Anxiety;Crying spells;Sadness  Eye Contact Fair  Facial Expression Anxious;Worried  Affect Anxious  Speech Logical/coherent  Interaction Minimal  Motor Activity Slow;Tremors  Appearance/Hygiene In scrubs  Behavior Characteristics Cooperative;Anxious  Mood Anxious;Preoccupied  Thought Process  Coherency WDL  Content WDL  Delusions None reported or observed  Perception WDL  Hallucination None reported or observed  Judgment WDL  Confusion None  Danger to Self  Current suicidal ideation? Denies  Danger to Others  Danger to Others None reported or observed    Problem: Education: Goal: Ability to state activities that reduce stress will improve Outcome: Progressing   Problem: Coping: Goal: Ability to identify and develop effective coping behavior will improve Outcome: Progressing   Problem: Self-Concept: Goal: Ability to identify factors that promote anxiety will improve Outcome: Progressing Goal: Level of anxiety will decrease Outcome: Progressing Goal: Ability to modify response to factors that promote anxiety will improve Outcome: Progressing

## 2023-10-24 NOTE — Group Note (Signed)
 LCSW Group Therapy Note  Group Date: 10/24/2023 Start Time: 1315 End Time: 1400   Type of Therapy and Topic:  Group Therapy - Healthy vs Unhealthy Coping Skills  Participation Level:  Minimal   Description of Group The focus of this group was to determine what unhealthy coping techniques typically are used by group members and what healthy coping techniques would be helpful in coping with various problems. Patients were guided in becoming aware of the differences between healthy and unhealthy coping techniques. Patients were asked to identify 2-3 healthy coping skills they would like to learn to use more effectively.  Therapeutic Goals Patients learned that coping is what human beings do all day long to deal with various situations in their lives Patients defined and discussed healthy vs unhealthy coping techniques Patients identified their preferred coping techniques and identified whether these were healthy or unhealthy Patients determined 2-3 healthy coping skills they would like to become more familiar with and use more often. Patients provided support and ideas to each other   Summary of Patient Progress:  During group, Otto expressed inability to cope with feelings around depression . Patient proved open to input from peers and feedback from CSW. Patient demonstrated fair insight into the subject matter, was respectful of peers, and participated throughout the entire session.   Therapeutic Modalities Cognitive Behavioral Therapy Motivational Interviewing  Claudio Culver, [place] 10/24/2023  2:17 PM

## 2023-10-24 NOTE — Group Note (Signed)
 Date:  10/24/2023 Time:  10:24 AM  Group Topic/Focus:  Movent Therapy    Participation Level:  Did Not Attend   Merton Abts 10/24/2023, 10:24 AM

## 2023-10-24 NOTE — BH Assessment (Signed)
 Recreation Therapy Notes  INPATIENT RECREATION THERAPY ASSESSMENT  Patient Details Name: Dawn Kelley MRN: [id number] DOB: 04/10/1961 Today's Date: 10/24/2023       Information Obtained From: Patient  Able to Participate in Assessment/Interview: Yes  Patient Presentation: Responsive (Tearful)  Reason for Admission (Per Patient): Active Symptoms (my meds just stopped working one day. I woke up and was a different person.)  Patient Stressors: Other (Comment) (it's like I became paralyzed and dont want to do anything now.)  Coping Skills:   Exercise (nature sounds)  Leisure Interests (2+):  Exercise - Walking, Crafts - Sewing  Frequency of Recreation/Participation: Monthly  Awareness of Community Resources:  Yes  Community Resources:  Restaurants  Current Use: No  If no, Barriers?:    Expressed Interest in [employer] Information: No  Patient Main Form of Transportation: Car (but there is no way I could drive now)  Patient Strengths:  I am good at cooking  Patient Identified Areas of Improvement:  Everything  Patient Goal for Hospitalization:  Everything  Current SI (including self-harm):  Yes  Current HI:  No  Current AVH: No  Staff Intervention Plan: Group Attendance, Collaborate with Interdisciplinary Treatment Team  Consent to Intern Participation: N/A    Dawn Kelley was pleasant upon interaction. Pt became tearful when not asking her questions. Pt shared that she enjoys cooking and makes a lot of things from scratch; enchiladas and egg plant parmesan. Pt shared that she cooks for her husband usually and hasn't been able to recently so he has lost weight. Pt shared that she has 3 kids of her own and 2 step children. Pt shared that she doesn't speak with one of her daughters because she feels she wrongfully divorced her husband. Pt shared that she enjoys sewing by hand, spending time with her chihuahua and dachshund mix,  Dawn Kelley who is 63 years old. Pt shared she was going out into the community and taking her friends to the hair shop and going to church ' once or twice a month before her medicine stopped working.   Pt shared that she is worried to come off her benzo's. Pt shared that the nighttime medicine makes her dizzy and feel off, LRT notified RN.   Pt shared that she moved to [place] in 2008 and her family lives back in [place]. Pt shared that they moved due to the economy and that she has been receiving therapy off and on since then. Pt shared that she has tried every coping skills there is and that nothing works. Pt shared that hearing relaxing sounds and or being in nature somewhat helps.   Of note, pt shared that she has not been filling out her menu for meals. LRT asked what meals pt likes to eat and pt shared: tacos, enchiladas, pasta salad, chef salad, and fruit.   [address] LRT, CTRS Dawn Kelley E Dawn Kelley 10/24/2023, 4:43 PM

## 2023-10-24 NOTE — Plan of Care (Signed)
°  Problem: Education: Goal: Ability to state activities that reduce stress will improve Outcome: Not Progressing   Problem: Self-Concept: Goal: Level of anxiety will decrease Outcome: Not Progressing

## 2023-10-24 NOTE — Group Note (Signed)
 Date:  10/24/2023 Time:  8:42 PM  Group Topic/Focus:  Goals Group:   The focus of this group is to help patients establish daily goals to achieve during treatment and discuss how the patient can incorporate goal setting into their daily lives to aide in recovery.    Participation Level:  Active  Participation Quality:  Appropriate  Affect:  Appropriate  Cognitive:  Appropriate  Insight: Appropriate  Engagement in Group:  Engaged  Modes of Intervention:  Education  Additional Comments:    Lynette Saras 10/24/2023, 8:42 PM

## 2023-10-25 DIAGNOSIS — F314 Bipolar disorder, current episode depressed, severe, without psychotic features: Secondary | ICD-10-CM | POA: Diagnosis not present

## 2023-10-25 MED ORDER — DULOXETINE HCL 20 MG PO CPEP
40.0000 mg | ORAL_CAPSULE | Freq: Two times a day (BID) | ORAL | Status: DC
  Administered 2023-10-25 – 2023-10-26 (×2): 40 mg via ORAL
  Filled 2023-10-25 (×2): qty 2

## 2023-10-25 MED ORDER — DULOXETINE HCL 60 MG PO CPEP: 60.0000 mg | ORAL_CAPSULE | Freq: Every day | ORAL | Status: DC
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
## 2023-10-25 NOTE — Progress Notes (Signed)
 [facility] MD Progress Note  10/25/2023 9:37 PM Dawn Kelley  MRN:  [id number] patient   Is a 63 year old female, with borderline personality disorder, benzodiazepine dependence and bipolar disorder per chart who was brought in by EMS after she was asked to be removed from the building she was in due to bizarre behavior. Patient is noted to be extremely anxious, making passive suicidal statements of not wanting to wake up after sleep. Husband reports overwhelmed with patient's anxiety. Patient was admitted to geropsych unit for further stabilization.   Subjective:  Chart reviewed, case discussed in multidisciplinary meeting, patient seen during rounds.  Send is noted to be getting along with peers.  Whenever she sees the provider she changes her affect with shaky voice and sobbing stating she is severely depressed.  Provider educates patient about utilizing her coping skills and participating in the groups.  She wanted to go back to her room and sleep.  Provider encouraged her to participate in the groups on the unit.  Provider discussed to optimize the Cymbalta  dosage to 40 mg twice daily to help with the depression and anxiety.  Patient is also educated about Librium  taper and to utilize hydroxyzine  to help with the anxiety.  She denies auditory/visual hallucinations and denies homicidal ideation.  She talks about not wanting to wake up but has no actual plan or intent as she keeps asking the provider to get her better quickly and that she cannot wait 4 weeks for this medication to work.  Provider encouraged her to think about ECT and TMS.  Patient became hysterical and started shaking her head saying she does not want ECT.  Provider explained the nature of depression and the importance of working on coping skills and feeling better.   Sleep: Poor  Appetite:  Fair  Past Psychiatric History: see h&P Family History:  Family History  Problem Relation Age of Onset   Depression Mother    Pneumonia Mother     Social History:  Social History   Substance and Sexual Activity  Alcohol Use Yes   Alcohol/week: 10.0 standard drinks of alcohol   Types: 10 Cans of beer per week   Comment: daily, last drank last night     Social History   Substance and Sexual Activity  Drug Use Yes   Types: Marijuana   Comment: daily    Social History   Socioeconomic History   Marital status: Married    Spouse name: Not on file   Number of children: Not on file   Years of education: Not on file   Highest education level: Not on file  Occupational History   Occupation: Disabled  Tobacco Use   Smoking status: Never    Passive exposure: Current   Smokeless tobacco: Never  Vaping Use   Vaping status: Never Used  Substance and Sexual Activity   Alcohol use: Yes    Alcohol/week: 10.0 standard drinks of alcohol    Types: 10 Cans of beer per week    Comment: daily, last drank last night   Drug use: Yes    Types: Marijuana    Comment: daily   Sexual activity: Not Currently    Birth control/protection: Post-menopausal  Other Topics Concern   Not on file  Social History Narrative   Pt lives in [place] with husband.  She is on disability.  Pt stated that she receives outpatient psychiatry services through [facility].  She does not receive outpatient therapy.   Social Drivers of Health  Financial Resource Strain: Low Risk  (10/23/2022)   Overall Financial Resource Strain (CARDIA)    Difficulty of Paying Living Expenses: Not hard at all  Food Insecurity: No Food Insecurity (10/22/2023)   Hunger Vital Sign    Worried About Running Out of Food in the Last Year: Never true    Ran Out of Food in the Last Year: Never true  Transportation Needs: No Transportation Needs (10/22/2023)   PRAPARE - Administrator, Civil Service (Medical): No    Lack of Transportation (Non-Medical): No  Physical Activity: Insufficiently Active (10/23/2022)   Exercise Vital Sign    Days of Exercise per Week:  2 days    Minutes of Exercise per Session: 10 min  Stress: No Stress Concern Present (10/23/2022)   [employer] of Occupational Health - Occupational Stress Questionnaire    Feeling of Stress : Not at all  Social Connections: Moderately Isolated (10/23/2022)   Social Connection and Isolation Panel    Frequency of Communication with Friends and Family: More than three times a week    Frequency of Social Gatherings with Friends and Family: More than three times a week    Attends Religious Services: Never    Database administrator or Organizations: No    Attends Engineer, structural: Never    Marital Status: Married   Past Medical History:  Past Medical History:  Diagnosis Date   Anxiety    Bipolar 1 disorder (HCC)    Depression    OCD (obsessive compulsive disorder)    PTSD (post-traumatic stress disorder)    S/P ECT (electroconvulsive therapy)     Past Surgical History:  Procedure Laterality Date   BIOPSY  02/03/2020   Procedure: BIOPSY;  Surgeon: Urban Garden, MD;  Location[facility];  Service: Gastroenterology;;   COLONOSCOPY WITH PROPOFOL  N/A 02/03/2020   [name]: 3mm polyp in sigmoid colon  as well as hemorrhoids, histology consistent with tubular adenoma   ESOPHAGOGASTRODUODENOSCOPY (EGD) WITH PROPOFOL  N/A 02/03/2020   castaneda: small gastric polyps, sessile, and fundic gland, small bowel bx normal   INDUCED ABORTION N/A 1980   POLYPECTOMY  02/03/2020   Procedure: POLYPECTOMY;  Surgeon: Umberto Ganong, Bearl Limes, MD;  Location: [facility];  Service: Gastroenterology;;   VEIN SURGERY      Current Medications: Current Facility-Administered Medications  Medication Dose Route Frequency Provider Last Rate Last Admin   alum & mag hydroxide-simeth (MAALOX/MYLANTA) 200-200-20 MG/5ML suspension 30 mL  30 mL Oral Q4H PRN McLauchlin, Angela, NP       antiseptic oral rinse (BIOTENE) solution 15 mL  15 mL Mouth Rinse PRN Aurelia Blotter, MD   15  mL at 10/25/23 1542   busPIRone  (BUSPAR ) tablet 15 mg  15 mg Oral TID Dennis Killilea, MD   15 mg at 10/25/23 2121   chlordiazePOXIDE  (LIBRIUM ) capsule 25 mg  25 mg Oral Q6H PRN Aurelia Blotter, MD       chlordiazePOXIDE  (LIBRIUM ) capsule 25 mg  25 mg Oral BH-qamhs Kesley Mullens, Charissa Compton, MD   25 mg at 10/25/23 2121   Followed by   Cecily Cohen ON 10/26/2023] chlordiazePOXIDE  (LIBRIUM ) capsule 25 mg  25 mg Oral Daily Yahshua Thibault, MD       DULoxetine  (CYMBALTA ) DR capsule 40 mg  40 mg Oral BID Talullah Abate, MD       feeding supplement (ENSURE PLUS HIGH PROTEIN) liquid 237 mL  237 mL Oral BID BM Artie Mcintyre, MD   237 mL at 10/25/23  1051   gabapentin  (NEURONTIN ) capsule 300 mg  300 mg Oral BID Jobany Montellano, MD   300 mg at 10/25/23 2121   hydrOXYzine  (ATARAX ) tablet 25 mg  25 mg Oral Q6H PRN Miguelangel Korn, MD   25 mg at 10/25/23 2121   hydrOXYzine  (ATARAX ) tablet 50 mg  50 mg Oral Q6H PRN Orra Nolde, MD   50 mg at 10/25/23 1100   levothyroxine  (SYNTHROID ) tablet 25 mcg  25 mcg Oral Q0600 Avangeline Stockburger, MD   25 mcg at 10/25/23 0605   loperamide  (IMODIUM ) capsule 2-4 mg  2-4 mg Oral PRN Asaph Serena, MD       losartan  (COZAAR ) tablet 25 mg  25 mg Oral Daily Samar Venneman, MD   25 mg at 10/25/23 6440   magnesium  hydroxide (MILK OF MAGNESIA) suspension 30 mL  30 mL Oral Daily PRN McLauchlin, Shelvy Dickens, NP       multivitamin with minerals tablet 1 tablet  1 tablet Oral Daily Somaly Marteney, MD   1 tablet at 10/25/23 3474   OLANZapine  zydis (ZYPREXA ) disintegrating tablet 5 mg  5 mg Oral TID PRN Ernst Cumpston, MD   5 mg at 10/22/23 1141   ondansetron  (ZOFRAN -ODT) disintegrating tablet 4 mg  4 mg Oral Q6H PRN Zaine Elsass, MD       pantoprazole  (PROTONIX ) EC tablet 40 mg  40 mg Oral Daily Thorne Wirz, MD   40 mg at 10/25/23 2595   rosuvastatin  (CRESTOR ) tablet 20 mg  20 mg Oral Daily Shamir Tuzzolino, MD   20 mg at 10/25/23 6387    Lab Results: No results found for this or any previous  visit (from the past 48 hours).  Blood Alcohol level:  Lab Results  Component Value Date   [facility] <15 10/21/2023   ETH <15 10/14/2023    Metabolic Disorder Labs: Lab Results  Component Value Date   HGBA1C 5.7 (H) 11/27/2022   No results found for: PROLACTIN No results found for: CHOL, TRIG, HDL, CHOLHDL, VLDL, LDLCALC  Physical Findings: AIMS:  , ,  ,  ,    CIWA:  CIWA-Ar Total: 7 COWS:      Psychiatric Specialty Exam:  Presentation  General Appearance:  Casual  Eye Contact: Fleeting  Speech: Normal Rate  Speech Volume: Normal    Mood and Affect  Mood: Anxious  Affect: Inappropriate   Thought Process  Thought Processes: Irrevelant  Descriptions of Associations:Intact  Orientation:Full (Time, Place and Person)  Thought Content:Illogical  Hallucinations:Hallucinations: None  Ideas of Reference:None  Suicidal Thoughts:denies Homicidal Thoughts:Homicidal Thoughts: No   Sensorium  Memory: Immediate Fair; Recent Fair; Remote Fair  Judgment: Impaired  Insight: Shallow   Executive Functions  Concentration: Poor  Attention Span: Poor  Recall: [employer] of Knowledge: Fair  Language: Fair   Psychomotor Activity  Psychomotor Activity: Psychomotor Activity: Normal  Musculoskeletal: Strength & Muscle Tone: within normal limits Gait & Station: normal Assets  Assets: Manufacturing systems engineer; Desire for Improvement; Social Support; Resilience    Physical Exam: Physical Exam ROS Blood pressure 126/68, pulse 95, temperature 98.7 F (37.1 C), resp. rate 20, height 5' 6 (1.676 m), weight 79.8 kg, SpO2 100%. Body mass index is 28.41 kg/m.  Diagnosis: Principal Problem:   Bipolar affective disorder (HCC) Generalized anxiety disorder with panic attacks Borderline personality disorder PTSD, chronic Cannabis use disorder, moderate Alcohol use disorder, moderate, currently in withdrawal   Clinical Decision  Making: Patient with history of borderline personality, depression and anxiety, recent increasing use of  alcohol and history of cannabis use presented to emergency room with worsening anxiety, mood lability and bizarre behavior.  Patient is currently admitted to geropsych unit.  Patient is endorsing passive suicidal ideation with no intent or plan.  Patient is noted to be withdrawing from alcohol and displays severe anxiety.  She needs ongoing inpatient hospitalization.   Treatment Plan Summary:   Safety and Monitoring:             -- Voluntary admission to inpatient psychiatric unit for safety, stabilization and treatment             -- Daily contact with patient to assess and evaluate symptoms and progress in treatment             -- Patient's case to be discussed in multi-disciplinary team meeting             -- Observation Level: q15 minute checks             -- Vital signs:  q12 hours             -- Precautions: suicide, elopement, and assault   2. Psychiatric Diagnoses and Treatment:               Increased Cymbalta  40 mg BID - Patient was tried on multiple SSRI with no response- all SSRIs , effexor  tried but pt states minimal response.  gabapentin  300 mg twice daily Given the history of alcohol use-CIWA protocol was initiated and Librium  taper is initiated Patient's husband reports Klonopin  has been discontinued by the previous provider few months ago. Continue BuSpar  15 mg 3 times daily   -- The risks/benefits/side-effects/alternatives to this medication were discussed in detail with the patient and time was given for questions. The patient consents to medication trial.                -- Metabolic profile and EKG monitoring obtained while on an atypical antipsychotic (BMI: Lipid Panel: HbgA1c: QTc:)              -- Encouraged patient to participate in unit milieu and in scheduled group therapies                            3. Medical Issues Being Addressed:  CIWA protocol to monitor  alcohol withdrawal with librium  taper    4. Discharge Planning:   -- Social work and case management to assist with discharge planning and identification of hospital follow-up needs prior to discharge  -- Estimated LOS: 3-4 days  Casten Floren, MD 10/25/2023, 9:37 PM

## 2023-10-25 NOTE — Plan of Care (Signed)
  Problem: Coping: Goal: Ability to identify and develop effective coping behavior will improve Outcome: Not Progressing   Problem: Self-Concept: Goal: Level of anxiety will decrease Outcome: Not Progressing Goal: Ability to modify response to factors that promote anxiety will improve Outcome: Not Progressing   

## 2023-10-25 NOTE — Plan of Care (Signed)
  Problem: Self-Concept: Goal: Ability to identify factors that promote anxiety will improve Outcome: Progressing   

## 2023-10-25 NOTE — Group Note (Signed)
 Physical/Occupational Therapy Group Note  Group Topic: Estate manager/land agent, Adaptive Equipment for ADLs  Group Date: 10/25/2023 Start Time: 1300 End Time: 1337 Facilitators: Orlie Bjornstad, OT   Group Description: Group educated on safety considerations/modifications with bathing, dressing and ADL routines to maximize independence and minimize fall risk with tasks.  Reviewed and demonstrated use of shower chair and tub transfer bench as appropriate.  Reviewed and demonstrated use of adaptive equipment (sock aide, reacher, long-handled sponge) available for ADL tasks.  Reviewed and demonstrated role of activity pacing and energy conservation with functional activities.  Provided handout with visual reference of available equipment.  Therapeutic Goal(s):  Verbalize and demonstrate safe technique for tub/shower transfers with DME/adaptive equipment as needed. Verbalize and demonstrate appropriate use of adaptive equipment with bathing, dressing and ADL routine. Verbalize and demonstrate appropriate use of activity pacing and energy conservation with bathing, dressing and ADL routine.  Individual Participation: pt did not attend   Participation Level:    Participation Quality:   Behavior:    Speech/Thought Process:    Affect/Mood:    Insight:   Judgement:    Modes of Intervention:   Patient Response to Interventions:     Plan: Continue to engage patient in PT/OT groups 1 - 2x/week.  10/25/2023  Orlie Bjornstad, OT Gerre Kraft, OTD OTR/L  10/25/23, 3:16 PM

## 2023-10-25 NOTE — Progress Notes (Signed)
 Patient is pleasant and cooperative.   Anxious affect.  Endorses anxiety and depression.  Crying spells observed.  Denies SI/HI and AVH. Denies pain.    Compliant with scheduled medications.  15 min checks in place for safety.  Patient is present in the milieu for meals and groups.  Appropriate interaction with peers and staff.    Patient appeared to have an anxiety attack.  BP checked twice.  Dr Kirk Peper aware.  Patient able to clam down and continue eating dinner.

## 2023-10-25 NOTE — Group Note (Signed)
 Recreation Therapy Group Note   Group Topic:General Recreation  Group Date: 10/25/2023 Start Time: 1430 End Time: 1530 Facilitators: Deatrice Factor, LRT, CTRS Location: Dayroom  Group Description: Positive Affirmation Bingo. LRT and patients played multiple games of Bingo with music playing in the background. LRT and pts discussed what a positive affirmation is, the importance of speaking kindly to yourself, and the use of this as a coping skill.   Goal Area(s) Addressed: Patient will learn positive affirmations.  Patient will engage in recreation activity.  Patient will increase communication.    Affect/Mood: Appropriate   Participation Level: Active and Engaged   Participation Quality: Independent   Behavior: Appropriate, Calm, and Cooperative   Speech/Thought Process: Coherent   Insight: Fair   Judgement: Fair    Modes of Intervention: Competitive Play   Patient Response to Interventions:  Attentive, Engaged, Interested , and Receptive   Education Outcome:  Acknowledges education   Clinical Observations/Individualized Feedback: Dawn Kelley was active in their participation of session activities and group discussion. Pt won a Facilities manager and was offered a stress ball as a Scientist, research (physical sciences). Pt interacted well with LRT and peers duration of session.      Plan: Continue to engage patient in RT group sessions 2-3x/week.   Deatrice Factor, LRT, CTRS 10/25/2023 4:55 PM

## 2023-10-25 NOTE — Group Note (Deleted)
 Physical/Occupational Therapy Group Note  Group Topic: Estate manager/land agent, Adaptive Equipment for ADLs  Group Date: 10/25/2023 Start Time: 1500 End Time: 1537 Facilitators: Orlie Bjornstad, OT   Group Description: Group educated on safety considerations/modifications with bathing, dressing and ADL routines to maximize independence and minimize fall risk with tasks.  Reviewed and demonstrated use of shower chair and tub transfer bench as appropriate.  Reviewed and demonstrated use of adaptive equipment (sock aide, reacher, long-handled sponge) available for ADL tasks.  Reviewed and demonstrated role of activity pacing and energy conservation with functional activities.  Provided handout with visual reference of available equipment.  Therapeutic Goal(s):  Verbalize and demonstrate safe technique for tub/shower transfers with DME/adaptive equipment as needed. Verbalize and demonstrate appropriate use of adaptive equipment with bathing, dressing and ADL routine. Verbalize and demonstrate appropriate use of activity pacing and energy conservation with bathing, dressing and ADL routine.  Individual Participation:  Participation Level: {OT [facility] Participation WUJWJ:19147}   Participation Quality: {OT [facility] Participation Quality:26268}   Behavior: {B[facility] OT Group Behavior:26269}   Speech/Thought Process: {B[facility] OT Speech/Thought Process:26270}   Affect/Mood: {OT [facility] Affect/Mood:26271}   Insight: {OT [facility] Insight:26272}   Judgement: {OT [facility] Judgement:26272}   Modes of Intervention: {BHH MODES OF INTERVENTION:26273}  Patient Response to Interventions:  {B[facility] OT Patient Response to Interventions:26274}   Plan: Continue to engage patient in PT/OT groups 1 - 2x/week.  10/25/2023  Orlie Bjornstad, OT

## 2023-10-25 NOTE — Group Note (Signed)
 Date:  10/25/2023 Time:  11:44 AM  Group Topic/Focus:  Building Self Esteem:   The Focus of this group is helping patients become aware of the effects of self-esteem on their lives, the things they and others do that enhance or undermine their self-esteem, seeing the relationship between their level of self-esteem and the choices they make and learning ways to enhance self-esteem.    Participation Level:  Active  Participation Quality:  Attentive  Affect:  Anxious  Cognitive:  Alert  Insight: Good  Engagement in Group:  Engaged  Modes of Intervention:  Discussion  Additional Comments:  N/A  Lyndol Santee 10/25/2023, 11:44 AM

## 2023-10-26 DIAGNOSIS — F314 Bipolar disorder, current episode depressed, severe, without psychotic features: Secondary | ICD-10-CM | POA: Diagnosis not present

## 2023-10-26 MED ORDER — LAMOTRIGINE 25 MG PO TABS
25.0000 mg | ORAL_TABLET | Freq: Every day | ORAL | Status: DC
  Administered 2023-10-26 – 2023-10-30 (×5): 25 mg via ORAL
  Filled 2023-10-26 (×5): qty 1

## 2023-10-26 MED ORDER — OMEGA-3-ACID ETHYL ESTERS 1 G PO CAPS
1.0000 g | ORAL_CAPSULE | Freq: Two times a day (BID) | ORAL | Status: DC
  Administered 2023-10-26 – 2023-11-01 (×6): 1 g via ORAL
  Filled 2023-10-26 (×13): qty 1

## 2023-10-26 NOTE — Group Note (Signed)
 Date:  10/26/2023 Time:  10:22 PM  Group Topic/Focus:  Managing Feelings:   The focus of this group is to identify what feelings patients have difficulty handling and develop a plan to handle them in a healthier way upon discharge.    Participation Level:  Active  Participation Quality:  Appropriate  Affect:  Appropriate  Cognitive:  Appropriate  Insight: Appropriate  Engagement in Group:  Engaged  Modes of Intervention:  Discussion  Additional Comments:    Lynette Saras 10/26/2023, 10:22 PM

## 2023-10-26 NOTE — Progress Notes (Signed)
 Patient is a voluntary admission to [facility] for Bipolar disorder with complaints of anxiety and depression and benzo withdrawal.  Patient is on a librium  taper and CIWAs. Medication changes were made today with lamictal  added on.  Patient Is attention seeking - interacts well with peers but when it comes to the nurse or doctor she cries and shakes her hands and complains of severe anxiety.  Patient instructed multiple times on relaxation techniques.  Patient alternates with isolating in her room and being intrusive at the desk.  Will continue to monitor.

## 2023-10-26 NOTE — Progress Notes (Signed)
 [facility] MD Progress Note  10/26/2023 9:52 AM Guy Toney  MRN:  [id number] patient   Is a 63 year old female, with borderline personality disorder, benzodiazepine dependence and bipolar disorder per chart who was brought in by EMS after she was asked to be removed from the building she was in due to bizarre behavior. Patient is noted to be extremely anxious, making passive suicidal statements of not wanting to wake up after sleep. Husband reports overwhelmed with patient's anxiety. Patient was admitted to geropsych unit for further stabilization.   Subjective: Per staff report patient continues to report extremely anxious.  Rates her anxiety 10 out of 10.  The patient has been sleeping okay.  Has been eating well.  Has been calm and cooperative.  Has been bathing and no concerns for her ADLs.  Staff also reports that patient does well and engaged with other peers well when nobody is looking at her.  But when staff asked start looking at her patient's thoughts to be more anxious, crying at times and seek attention.  Patient was also noted to be engaging well when this provider was seen other patients and she was eating her breakfast.  When this provider introduced himself she became extremely anxious, started crying and reports that her depression is really back.  Feels that she is very anxious and her medications are not helping.  Patient reports that she does not have any active suicidal thoughts or plan but reports that she would not mind if she could die and sleep but emphasized that she would not actively do anything to hurt herself.  Patient reports that she had hit her head 2 years ago and still has a bump on her left forehead.  Reports that it is not causing any pain or problem but she is just worried why she still has bump.  Patient reports that she does not have any appetite but staff report that patient is eating well without any issues.  Patient presentation is more consistent with symptoms of  borderline personality disorder and would recommend DBT therapy on an outpatient basis.   Sleep: Poor  Appetite:  Fair  Past Psychiatric History: see h&P Family History:  Family History  Problem Relation Age of Onset   Depression Mother    Pneumonia Mother    Social History:  Social History   Substance and Sexual Activity  Alcohol Use Yes   Alcohol/week: 10.0 standard drinks of alcohol   Types: 10 Cans of beer per week   Comment: daily, last drank last night     Social History   Substance and Sexual Activity  Drug Use Yes   Types: Marijuana   Comment: daily    Social History   Socioeconomic History   Marital status: Married    Spouse name: Not on file   Number of children: Not on file   Years of education: Not on file   Highest education level: Not on file  Occupational History   Occupation: Disabled  Tobacco Use   Smoking status: Never    Passive exposure: Current   Smokeless tobacco: Never  Vaping Use   Vaping status: Never Used  Substance and Sexual Activity   Alcohol use: Yes    Alcohol/week: 10.0 standard drinks of alcohol    Types: 10 Cans of beer per week    Comment: daily, last drank last night   Drug use: Yes    Types: Marijuana    Comment: daily   Sexual activity: Not Currently  Birth control/protection: Post-menopausal  Other Topics Concern   Not on file  Social History Narrative   Pt lives in [place] with husband.  She is on disability.  Pt stated that she receives outpatient psychiatry services through [facility].  She does not receive outpatient therapy.   Social Drivers of Corporate investment banker Strain: Low Risk  (10/23/2022)   Overall Financial Resource Strain (CARDIA)    Difficulty of Paying Living Expenses: Not hard at all  Food Insecurity: No Food Insecurity (10/22/2023)   Hunger Vital Sign    Worried About Running Out of Food in the Last Year: Never true    Ran Out of Food in the Last Year: Never true   Transportation Needs: No Transportation Needs (10/22/2023)   PRAPARE - Administrator, Civil Service (Medical): No    Lack of Transportation (Non-Medical): No  Physical Activity: Insufficiently Active (10/23/2022)   Exercise Vital Sign    Days of Exercise per Week: 2 days    Minutes of Exercise per Session: 10 min  Stress: No Stress Concern Present (10/23/2022)   [employer] of Occupational Health - Occupational Stress Questionnaire    Feeling of Stress : Not at all  Social Connections: Moderately Isolated (10/23/2022)   Social Connection and Isolation Panel    Frequency of Communication with Friends and Family: More than three times a week    Frequency of Social Gatherings with Friends and Family: More than three times a week    Attends Religious Services: Never    Database administrator or Organizations: No    Attends Engineer, structural: Never    Marital Status: Married   Past Medical History:  Past Medical History:  Diagnosis Date   Anxiety    Bipolar 1 disorder (HCC)    Depression    OCD (obsessive compulsive disorder)    PTSD (post-traumatic stress disorder)    S/P ECT (electroconvulsive therapy)     Past Surgical History:  Procedure Laterality Date   BIOPSY  02/03/2020   Procedure: BIOPSY;  Surgeon: Urban Garden, MD;  Location: [facility];  Service: Gastroenterology;;   COLONOSCOPY WITH PROPOFOL  N/A 02/03/2020   [name]: 3mm polyp in sigmoid colon  as well as hemorrhoids, histology consistent with tubular adenoma   ESOPHAGOGASTRODUODENOSCOPY (EGD) WITH PROPOFOL  N/A 02/03/2020   castaneda: small gastric polyps, sessile, and fundic gland, small bowel bx normal   INDUCED ABORTION N/A 1980   POLYPECTOMY  02/03/2020   Procedure: POLYPECTOMY;  Surgeon: Umberto Ganong, Bearl Limes, MD;  Location: [facility];  Service: Gastroenterology;;   VEIN SURGERY      Current Medications: Current Facility-Administered Medications   Medication Dose Route Frequency Provider Last Rate Last Admin   alum & mag hydroxide-simeth (MAALOX/MYLANTA) 200-200-20 MG/5ML suspension 30 mL  30 mL Oral Q4H PRN McLauchlin, Angela, NP       antiseptic oral rinse (BIOTENE) solution 15 mL  15 mL Mouth Rinse PRN Aurelia Blotter, MD   15 mL at 10/25/23 1542   busPIRone  (BUSPAR ) tablet 15 mg  15 mg Oral TID Jadapalle, Sree, MD   15 mg at 10/26/23 1610   chlordiazePOXIDE  (LIBRIUM ) capsule 25 mg  25 mg Oral Q6H PRN Jadapalle, Sree, MD       chlordiazePOXIDE  (LIBRIUM ) capsule 25 mg  25 mg Oral Daily Jadapalle, Sree, MD       DULoxetine  (CYMBALTA ) DR capsule 40 mg  40 mg Oral BID Jadapalle, Sree, MD   40  mg at 10/26/23 0832   feeding supplement (ENSURE PLUS HIGH PROTEIN) liquid 237 mL  237 mL Oral BID BM Jadapalle, Sree, MD   237 mL at 10/26/23 0935   gabapentin  (NEURONTIN ) capsule 300 mg  300 mg Oral BID Jadapalle, Sree, MD   300 mg at 10/26/23 5643   hydrOXYzine  (ATARAX ) tablet 25 mg  25 mg Oral Q6H PRN Jadapalle, Sree, MD   25 mg at 10/25/23 2121   hydrOXYzine  (ATARAX ) tablet 50 mg  50 mg Oral Q6H PRN Jadapalle, Sree, MD   50 mg at 10/25/23 1100   levothyroxine  (SYNTHROID ) tablet 25 mcg  25 mcg Oral Q0600 Jadapalle, Sree, MD   25 mcg at 10/26/23 0608   loperamide  (IMODIUM ) capsule 2-4 mg  2-4 mg Oral PRN Jadapalle, Sree, MD       losartan  (COZAAR ) tablet 25 mg  25 mg Oral Daily Jadapalle, Sree, MD   25 mg at 10/26/23 0935   magnesium  hydroxide (MILK OF MAGNESIA) suspension 30 mL  30 mL Oral Daily PRN McLauchlin, Shelvy Dickens, NP   30 mL at 10/26/23 0835   multivitamin with minerals tablet 1 tablet  1 tablet Oral Daily Jadapalle, Sree, MD   1 tablet at 10/26/23 3295   OLANZapine  zydis (ZYPREXA ) disintegrating tablet 5 mg  5 mg Oral TID PRN Jadapalle, Sree, MD   5 mg at 10/22/23 1141   ondansetron  (ZOFRAN -ODT) disintegrating tablet 4 mg  4 mg Oral Q6H PRN Jadapalle, Sree, MD       pantoprazole  (PROTONIX ) EC tablet 40 mg  40 mg Oral Daily Jadapalle, Sree, MD    40 mg at 10/26/23 0833   rosuvastatin  (CRESTOR ) tablet 20 mg  20 mg Oral Daily Jadapalle, Sree, MD   20 mg at 10/26/23 1884    Lab Results: No results found for this or any previous visit (from the past 48 hours).  Blood Alcohol level:  Lab Results  Component Value Date   [facility] <15 10/21/2023   ETH <15 10/14/2023    Metabolic Disorder Labs: Lab Results  Component Value Date   HGBA1C 5.7 (H) 11/27/2022   No results found for: PROLACTIN No results found for: CHOL, TRIG, HDL, CHOLHDL, VLDL, LDLCALC  Physical Findings: AIMS:  , ,  ,  ,    CIWA:  CIWA-Ar Total: 0 COWS:      Psychiatric Specialty Exam:  Presentation  General Appearance:  Casual  Eye Contact: Fleeting  Speech: Normal Rate  Speech Volume: Normal    Mood and Affect  Mood: Anxious  Affect: Inappropriate   Thought Process  Thought Processes: Irrevelant  Descriptions of Associations:Intact  Orientation:Full (Time, Place and Person)  Thought Content:Illogical  Hallucinations:No data recorded  Ideas of Reference:None  Suicidal Thoughts:denies Homicidal Thoughts:No data recorded   Sensorium  Memory: Immediate Fair; Recent Fair; Remote Fair  Judgment: Impaired  Insight: Shallow   Executive Functions  Concentration: Poor  Attention Span: Poor  Recall: [employer] of Knowledge: Fair  Language: Fair   Psychomotor Activity  Psychomotor Activity: No data recorded  Musculoskeletal: Strength & Muscle Tone: within normal limits Gait & Station: normal Assets  Assets: Manufacturing systems engineer; Desire for Improvement; Social Support; Resilience    Physical Exam: Physical Exam Physical Exam Constitutional:      Appearance: Normal appearance.  HENT:     Head: Normocephalic and atraumatic.     Right Ear: External ear normal.     Left Ear: External ear normal.     Nose: Nose normal.  Mouth/Throat:     Mouth: Mucous membranes are moist.  Eyes:      Extraocular Movements: Extraocular movements intact.     Pupils: Pupils are equal, round, and reactive to light.  Cardiovascular:     Rate and Rhythm: Normal rate.  Pulmonary:     Effort: Pulmonary effort is normal.     Breath sounds: Normal breath sounds.  Abdominal:     Palpations: Abdomen is soft.  Musculoskeletal:        General: Normal range of motion.     Cervical back: Normal range of motion and neck supple.  Skin:    General: Skin is warm.  Neurological:     General: No focal deficit present.     Mental Status:  alert.    ROS Review of Systems  All other systems reviewed and are negative.  Blood pressure 130/84, pulse (!) 111, temperature (!) 97.2 F (36.2 C), resp. rate 18, height 5' 6 (1.676 m), weight 79.8 kg, SpO2 100%. Body mass index is 28.41 kg/m.  Diagnosis: Principal Problem:   Bipolar affective disorder (HCC) Generalized anxiety disorder with panic attacks Borderline personality disorder PTSD, chronic Cannabis use disorder, moderate Alcohol use disorder, moderate, currently in withdrawal   Clinical Decision Making: Patient with history of borderline personality, depression and anxiety, recent increasing use of alcohol and history of cannabis use presented to emergency room with worsening anxiety, mood lability and bizarre behavior.  Patient is currently admitted to geropsych unit.  Patient is endorsing passive suicidal ideation with no intent or plan.  Patient is noted to be withdrawing from alcohol and displays severe anxiety.  She needs ongoing inpatient hospitalization.   Treatment Plan Summary:   Safety and Monitoring:             -- Voluntary admission to inpatient psychiatric unit for safety, stabilization and treatment             -- Daily contact with patient to assess and evaluate symptoms and progress in treatment             -- Patient's case to be discussed in multi-disciplinary team meeting             -- Observation Level: q15 minute  checks             -- Vital signs:  q12 hours             -- Precautions: suicide, elopement, and assault   2. Psychiatric Diagnoses and Treatment:               Discontinue Cymbalta .   Will start patient on Lamictal  25 mg daily. Will also start patient on omega-3 fatty acids Would recommend DBT therapy and outpatient basis.     gabapentin  300 mg twice daily Given the history of alcohol use-CIWA protocol was initiated and Librium  taper is initiated Patient's husband reports Klonopin  has been discontinued by the previous provider few months ago. Continue BuSpar  15 mg 3 times daily   -- The risks/benefits/side-effects/alternatives to this medication were discussed in detail with the patient and time was given for questions. The patient consents to medication trial.                -- Metabolic profile and EKG monitoring obtained while on an atypical antipsychotic (BMI: Lipid Panel: HbgA1c: QTc:)              -- Encouraged patient to participate in unit milieu and in scheduled group therapies  3. Medical Issues Being Addressed:  CIWA protocol to monitor alcohol withdrawal with librium  taper    4. Discharge Planning:   -- Social work and case management to assist with discharge planning and identification of hospital follow-up needs prior to discharge  -- Estimated LOS: 3-4 days  Albert Huff, MD 10/26/2023, 9:52 AM

## 2023-10-26 NOTE — Group Note (Signed)
 Date:  10/26/2023 Time:  11:00 AM  Group Topic/Focus:  Self Care:   The focus of this group is to help patients understand the importance of self-care in order to improve or restore emotional, physical, spiritual, interpersonal, and financial health.    Participation Level:  None  Participation Quality:  came to group late  Affect:  Anxious  Cognitive:  didn't try to participate   Insight: None  Engagement in Group:  Distracting  Modes of Intervention:  Activity and Education  Additional Comments:  late to group and distractive  Merton Abts 10/26/2023, 11:00 AM

## 2023-10-26 NOTE — Plan of Care (Signed)
   Problem: Coping: Goal: Ability to identify and develop effective coping behavior will improve Outcome: Progressing   Problem: Self-Concept: Goal: Level of anxiety will decrease Outcome: Progressing

## 2023-10-26 NOTE — Progress Notes (Signed)
   10/25/23 2000  Psych Admission Type (Psych Patients Only)  Admission Status Voluntary  Psychosocial Assessment  Patient Complaints Anxiety;Depression  Eye Contact Fair  Facial Expression Anxious  Affect Anxious  Speech Logical/coherent  Interaction Assertive;Attention-seeking  Motor Activity Slow  Appearance/Hygiene Unremarkable  Behavior Characteristics Cooperative;Appropriate to situation;Anxious  Mood Anxious;Pleasant;Depressed  Thought Process  Coherency WDL  Content Preoccupation  Delusions None reported or observed  Perception WDL  Hallucination None reported or observed  Judgment WDL  Confusion None  Danger to Self  Current suicidal ideation? Denies (Denies)  Danger to Others  Danger to Others None reported or observed

## 2023-10-26 NOTE — Progress Notes (Signed)
 Patient ID: Dawn Kelley, female   DOB: 01-12-1961, 63 y.o.   MRN: [id number] CSW called patients husband listed on signed release and was unable to reach to do SPE.

## 2023-10-26 NOTE — Group Note (Signed)
 Date:  10/26/2023 Time:  2:05 AM  Group Topic/Focus:  Wrap-Up Group:   The focus of this group is to help patients review their daily goal of treatment and discuss progress on daily workbooks.    Participation Level:  Did Not Attend   Maglione,Lanelle Lindo E 10/26/2023, 2:05 AM

## 2023-10-27 DIAGNOSIS — F314 Bipolar disorder, current episode depressed, severe, without psychotic features: Secondary | ICD-10-CM | POA: Diagnosis not present

## 2023-10-27 MED ORDER — GABAPENTIN 300 MG PO CAPS
300.0000 mg | ORAL_CAPSULE | Freq: Three times a day (TID) | ORAL | Status: DC
  Administered 2023-10-27 – 2023-11-01 (×12): 300 mg via ORAL
  Filled 2023-10-27 (×15): qty 1

## 2023-10-27 NOTE — Progress Notes (Addendum)
 Patient is a patient of [facility] for anxiety and depression with benzo withdrawal. Patient is on CIWA. Patient's main complaint is depression with multiple complaints that she (her depression) is worse since being admitted.  Advised she needs to give the medication time to work and that it takes time and coping skills that she is learning here will help.  Medicated once with hydroxyzine  at patient's request. Slept through breakfast but joined her peers for lunch and dinner and interacted well with them.  Only cried when I tried to engage with her. Gave patient a hand out of diversional activities to try and help her find more coping skills. Will continue to monitor.  Patient states she is planning on ending herself when she is discharged and asked the MHT to keep it a secret.  Patient advised we take statements like that seriously and would be noted.

## 2023-10-27 NOTE — [facility] Suicide Risk Assessment (Signed)
 [facility] INPATIENT:  Family/Significant Other Suicide Prevention Education  Suicide Prevention Education:  Contact Attempts: Katherene Pals, husband, [phone], (name of family member/significant other) has been identified by the patient as the family member/significant other with whom the patient will be residing, and identified as the person(s) who will aid the patient in the event of a mental health crisis.  With written consent from the patient, two attempts were made to provide suicide prevention education, prior to and/or following the patient's discharge.  We were unsuccessful in providing suicide prevention education.  A suicide education pamphlet was given to the patient to share with family/significant other.  Date and time of first attempt:10/26/2023 Date and time of second attempt:10/27/2023 at 1:55 PM   Claudio Culver 10/27/2023, 1:55 PM

## 2023-10-27 NOTE — Plan of Care (Signed)
 Patient is compliant with treatment plan. Interacting well with Games developer. Refused Omega 3 medication this shift stating that she does not like the fish taste to it. Compliant with all other medications no adverse effects noted.   Endorses anxiety Denies SI/HI/A/VH at present verbally contracts for safety. Patient has been tearful R/T Stating that she does not want to ever do ECT. Support and encouragement provided. Q 15 minutes safety checks ongoing.     Problem: Self-Concept: Goal: Ability to identify factors that promote anxiety will improve Outcome: Progressing Goal: Level of anxiety will decrease Outcome: Progressing Goal: Ability to modify response to factors that promote anxiety will improve Outcome: Progressing

## 2023-10-27 NOTE — Progress Notes (Signed)
 [facility] MD Progress Note  10/27/2023 12:04 PM Dawn Kelley  MRN:  [id number] patient   Is a 63 year old female, with borderline personality disorder, benzodiazepine dependence and bipolar disorder per chart who was brought in by EMS after she was asked to be removed from the building she was in due to bizarre behavior. Patient is noted to be extremely anxious, making passive suicidal statements of not wanting to wake up after sleep. Husband reports overwhelmed with patient's anxiety. Patient was admitted to geropsych unit for further stabilization.   Subjective: Per staff report patient continues to require redirection.  Frequently reports of feeling anxious and at times crying.  Nurses report that the patient was crying only when staff is looking at her in the door to get the attention.  Patient reportedly slept well and eating good.  Patient on interview reports that she is still not feeling good.  Reports that she is still significantly depressed.  Denied any suicidal homicidal ideations.  Quite complaining about dry mouth.  Told the patient about getting ice chips and sips of water  to help with the dry mouth.  Patient also reports that she does not like the taste of fish oil.  Reports that beneficial that she takes does not have that fishy smell.  Discussed with the patient that unfortunately we might not have specific/nonodorous fish oil tablet.  Patient agreeable to take the medication.  Reports that she eats but her appetite is not good.  She reports that she does not want to harm herself and that is why she forced her to eat.  Denies any suicidal or homicidal thoughts.  Patient reports that she is sleeping well without any issues.  Patient symptoms are consistent with with borderline knowledge disorder and talk to the social worker to recommend DBT on an outpatient basis.   Sleep: Poor  Appetite:  Fair  Past Psychiatric History: see h&P Family History:  Family History  Problem Relation Age of  Onset   Depression Mother    Pneumonia Mother    Social History:  Social History   Substance and Sexual Activity  Alcohol Use Yes   Alcohol/week: 10.0 standard drinks of alcohol   Types: 10 Cans of beer per week   Comment: daily, last drank last night     Social History   Substance and Sexual Activity  Drug Use Yes   Types: Marijuana   Comment: daily    Social History   Socioeconomic History   Marital status: Married    Spouse name: Not on file   Number of children: Not on file   Years of education: Not on file   Highest education level: Not on file  Occupational History   Occupation: Disabled  Tobacco Use   Smoking status: Never    Passive exposure: Current   Smokeless tobacco: Never  Vaping Use   Vaping status: Never Used  Substance and Sexual Activity   Alcohol use: Yes    Alcohol/week: 10.0 standard drinks of alcohol    Types: 10 Cans of beer per week    Comment: daily, last drank last night   Drug use: Yes    Types: Marijuana    Comment: daily   Sexual activity: Not Currently    Birth control/protection: Post-menopausal  Other Topics Concern   Not on file  Social History Narrative   Pt lives in [place] with husband.  She is on disability.  Pt stated that she receives outpatient psychiatry services through [facility].  She does  not receive outpatient therapy.   Social Drivers of Corporate investment banker Strain: Low Risk  (10/23/2022)   Overall Financial Resource Strain (CARDIA)    Difficulty of Paying Living Expenses: Not hard at all  Food Insecurity: No Food Insecurity (10/22/2023)   Hunger Vital Sign    Worried About Running Out of Food in the Last Year: Never true    Ran Out of Food in the Last Year: Never true  Transportation Needs: No Transportation Needs (10/22/2023)   PRAPARE - Administrator, Civil Service (Medical): No    Lack of Transportation (Non-Medical): No  Physical Activity: Insufficiently Active (10/23/2022)    Exercise Vital Sign    Days of Exercise per Week: 2 days    Minutes of Exercise per Session: 10 min  Stress: No Stress Concern Present (10/23/2022)   [employer] of Occupational Health - Occupational Stress Questionnaire    Feeling of Stress : Not at all  Social Connections: Moderately Isolated (10/23/2022)   Social Connection and Isolation Panel    Frequency of Communication with Friends and Family: More than three times a week    Frequency of Social Gatherings with Friends and Family: More than three times a week    Attends Religious Services: Never    Database administrator or Organizations: No    Attends Engineer, structural: Never    Marital Status: Married   Past Medical History:  Past Medical History:  Diagnosis Date   Anxiety    Bipolar 1 disorder (HCC)    Depression    OCD (obsessive compulsive disorder)    PTSD (post-traumatic stress disorder)    S/P ECT (electroconvulsive therapy)     Past Surgical History:  Procedure Laterality Date   BIOPSY  02/03/2020   Procedure: BIOPSY;  Surgeon: Urban Garden, MD;  Location: [facility];  Service: Gastroenterology;;   COLONOSCOPY WITH PROPOFOL  N/A 02/03/2020   [name]: 3mm polyp in sigmoid colon  as well as hemorrhoids, histology consistent with tubular adenoma   ESOPHAGOGASTRODUODENOSCOPY (EGD) WITH PROPOFOL  N/A 02/03/2020   castaneda: small gastric polyps, sessile, and fundic gland, small bowel bx normal   INDUCED ABORTION N/A 1980   POLYPECTOMY  02/03/2020   Procedure: POLYPECTOMY;  Surgeon: Umberto Ganong, Bearl Limes, MD;  Location: [facility];  Service: Gastroenterology;;   VEIN SURGERY      Current Medications: Current Facility-Administered Medications  Medication Dose Route Frequency Provider Last Rate Last Admin   alum & mag hydroxide-simeth (MAALOX/MYLANTA) 200-200-20 MG/5ML suspension 30 mL  30 mL Oral Q4H PRN McLauchlin, Angela, NP       antiseptic oral rinse (BIOTENE) solution 15  mL  15 mL Mouth Rinse PRN Aurelia Blotter, MD   15 mL at 10/25/23 1542   busPIRone  (BUSPAR ) tablet 15 mg  15 mg Oral TID Jadapalle, Sree, MD   15 mg at 10/27/23 0920   feeding supplement (ENSURE PLUS HIGH PROTEIN) liquid 237 mL  237 mL Oral BID BM Jadapalle, Sree, MD   237 mL at 10/27/23 0926   gabapentin  (NEURONTIN ) capsule 300 mg  300 mg Oral BID Jadapalle, Sree, MD   300 mg at 10/27/23 6578   hydrOXYzine  (ATARAX ) tablet 50 mg  50 mg Oral Q6H PRN Jadapalle, Sree, MD   50 mg at 10/26/23 2352   lamoTRIgine  (LAMICTAL ) tablet 25 mg  25 mg Oral Daily Ahmere Hemenway, MD   25 mg at 10/27/23 0922   levothyroxine  (SYNTHROID ) tablet 25 mcg  25 mcg Oral Q0600 Jadapalle, Sree, MD   25 mcg at 10/27/23 0636   losartan  (COZAAR ) tablet 25 mg  25 mg Oral Daily Jadapalle, Sree, MD   25 mg at 10/27/23 5784   magnesium  hydroxide (MILK OF MAGNESIA) suspension 30 mL  30 mL Oral Daily PRN McLauchlin, Shelvy Dickens, NP   30 mL at 10/26/23 0835   multivitamin with minerals tablet 1 tablet  1 tablet Oral Daily Jadapalle, Sree, MD   1 tablet at 10/27/23 6962   OLANZapine  zydis (ZYPREXA ) disintegrating tablet 5 mg  5 mg Oral TID PRN Jadapalle, Sree, MD   5 mg at 10/22/23 1141   omega-3 acid ethyl esters (LOVAZA) capsule 1 g  1 g Oral BID Kristilyn Coltrane, MD   1 g at 10/26/23 1056   pantoprazole  (PROTONIX ) EC tablet 40 mg  40 mg Oral Daily Jadapalle, Sree, MD   40 mg at 10/27/23 9528   rosuvastatin  (CRESTOR ) tablet 20 mg  20 mg Oral Daily Jadapalle, Sree, MD   20 mg at 10/27/23 0920    Lab Results: No results found for this or any previous visit (from the past 48 hours).  Blood Alcohol level:  Lab Results  Component Value Date   [facility] <15 10/21/2023   ETH <15 10/14/2023    Metabolic Disorder Labs: Lab Results  Component Value Date   HGBA1C 5.7 (H) 11/27/2022   No results found for: PROLACTIN No results found for: CHOL, TRIG, HDL, CHOLHDL, VLDL, LDLCALC  Physical Findings: AIMS:  , ,  ,  ,     CIWA:  CIWA-Ar Total: 0 COWS:      Psychiatric Specialty Exam:  Presentation  General Appearance:  Casual  Eye Contact: Fleeting  Speech: Normal Rate  Speech Volume: Normal    Mood and Affect  Mood: Anxious  Affect: Inappropriate   Thought Process  Thought Processes: Irrevelant  Descriptions of Associations:Intact  Orientation:Full (Time, Place and Person)  Thought Content:Illogical  Hallucinations:No data recorded  Ideas of Reference:None  Suicidal Thoughts:denies Homicidal Thoughts:No data recorded   Sensorium  Memory: Immediate Fair; Recent Fair; Remote Fair  Judgment: Impaired  Insight: Shallow   Executive Functions  Concentration: Poor  Attention Span: Poor  Recall: [employer] of Knowledge: Fair  Language: Fair   Psychomotor Activity  Psychomotor Activity: No data recorded  Musculoskeletal: Strength & Muscle Tone: within normal limits Gait & Station: normal Assets  Assets: Manufacturing systems engineer; Desire for Improvement; Social Support; Resilience    Physical Exam: Physical Exam Physical Exam Constitutional:      Appearance: Normal appearance.  HENT:     Head: Normocephalic and atraumatic.     Right Ear: External ear normal.     Left Ear: External ear normal.     Nose: Nose normal.     Mouth/Throat:     Mouth: Mucous membranes are moist.  Eyes:     Extraocular Movements: Extraocular movements intact.     Pupils: Pupils are equal, round, and reactive to light.  Cardiovascular:     Rate and Rhythm: Normal rate.  Pulmonary:     Effort: Pulmonary effort is normal.     Breath sounds: Normal breath sounds.  Abdominal:     Palpations: Abdomen is soft.  Musculoskeletal:        General: Normal range of motion.     Cervical back: Normal range of motion and neck supple.  Skin:    General: Skin is warm.  Neurological:     General: No  focal deficit present.     Mental Status:  alert.    ROS Review of  Systems  All other systems reviewed and are negative.  Blood pressure 112/60, pulse 72, temperature (!) 97.5 F (36.4 C), resp. rate 19, height 5' 6 (1.676 m), weight 79.8 kg, SpO2 98%. Body mass index is 28.41 kg/m.  Diagnosis: Principal Problem:   Bipolar affective disorder (HCC) Generalized anxiety disorder with panic attacks Borderline personality disorder PTSD, chronic Cannabis use disorder, moderate Alcohol use disorder, moderate, currently in withdrawal   Clinical Decision Making: Patient with history of borderline personality, depression and anxiety, recent increasing use of alcohol and history of cannabis use presented to emergency room with worsening anxiety, mood lability and bizarre behavior.  Patient is currently admitted to geropsych unit.  Patient is endorsing passive suicidal ideation with no intent or plan.  Patient is noted to be withdrawing from alcohol and displays severe anxiety.  She needs ongoing inpatient hospitalization.   Treatment Plan Summary:   Safety and Monitoring:             -- Voluntary admission to inpatient psychiatric unit for safety, stabilization and treatment             -- Daily contact with patient to assess and evaluate symptoms and progress in treatment             -- Patient's case to be discussed in multi-disciplinary team meeting             -- Observation Level: q15 minute checks             -- Vital signs:  q12 hours             -- Precautions: suicide, elopement, and assault   2. Psychiatric Diagnoses and Treatment:               Discontinue Cymbalta  on 10/26/2023 Continue Lamictal  25 mg daily. Continue omega-3 fatty acids Would recommend DBT therapy and outpatient basis.    Increase gabapentin  to 300 mg 3 times daily to help with anxiety/mood Given the history of alcohol use-CIWA protocol was initiated and Librium  taper is initiated Patient's husband reports Klonopin  has been discontinued by the previous provider few months  ago. Continue BuSpar  15 mg 3 times daily   -- The risks/benefits/side-effects/alternatives to this medication were discussed in detail with the patient and time was given for questions. The patient consents to medication trial.                -- Metabolic profile and EKG monitoring obtained while on an atypical antipsychotic (BMI: Lipid Panel: HbgA1c: QTc:)              -- Encouraged patient to participate in unit milieu and in scheduled group therapies                            3. Medical Issues Being Addressed:  CIWA protocol to monitor alcohol withdrawal with librium  taper    4. Discharge Planning:   -- Social work and case management to assist with discharge planning and identification of hospital follow-up needs prior to discharge  -- Estimated LOS: 3-4 days  Albert Huff, MD 10/27/2023, 12:04 PM

## 2023-10-27 NOTE — Group Note (Signed)
 Date:  10/27/2023 Time:  11:24 AM  Group Topic/Focus:  Outside Rec/Music Therapy  The purpose of this group is to allow patients to go out and get air while doing outside activities also listening to soothing music     Participation Level:  Did Not Attend  Linnell Richardson 10/27/2023, 11:24 AM

## 2023-10-27 NOTE — Group Note (Unsigned)
 Date:  10/28/2023 Time:  1:20 AM  Group Topic/Focus:  Wrap-Up Group:   The focus of this group is to help patients review their daily goal of treatment and discuss progress on daily workbooks.    Participation Level:  Minimal  Participation Quality:  Appropriate  Affect:  Appropriate  Cognitive:  Appropriate  Insight: Good  Engagement in Group:  Engaged  Modes of Intervention:  Discussion  Additional Comments:    Rolland Cline 10/28/2023, 1:20 AM

## 2023-10-28 DIAGNOSIS — F314 Bipolar disorder, current episode depressed, severe, without psychotic features: Secondary | ICD-10-CM | POA: Diagnosis not present

## 2023-10-28 MED ORDER — PILOCARPINE HCL 5 MG PO TABS
5.0000 mg | ORAL_TABLET | Freq: Two times a day (BID) | ORAL | Status: DC
  Administered 2023-10-28 – 2023-10-30 (×4): 5 mg via ORAL
  Filled 2023-10-28 (×5): qty 1

## 2023-10-28 MED ORDER — MIDODRINE HCL 5 MG PO TABS: 10.0000 mg | ORAL_TABLET | Freq: Three times a day (TID) | ORAL | Status: DC | PRN

## 2023-10-28 MED ORDER — FLUOXETINE HCL 10 MG PO CAPS
10.0000 mg | ORAL_CAPSULE | Freq: Every day | ORAL | Status: DC
  Administered 2023-10-29 – 2023-10-30 (×2): 10 mg via ORAL
  Filled 2023-10-28 (×3): qty 1
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
## 2023-10-28 NOTE — Plan of Care (Signed)
   Problem: Coping: Goal: Ability to identify and develop effective coping behavior will improve Outcome: Progressing   Problem: Self-Concept: Goal: Level of anxiety will decrease Outcome: Progressing

## 2023-10-28 NOTE — Group Note (Signed)
 Date:  10/28/2023 Time:  10:50 AM  Group Topic/Focus:  Fresh air Therapy Outdoors    Participation Level:  Did Not Attend    Merton Abts 10/28/2023, 10:50 AM

## 2023-10-28 NOTE — Group Note (Signed)
 Date:  10/28/2023 Time:  8:45 PM  Group Topic/Focus:  Recovery Goals:   The focus of this group is to identify appropriate goals for recovery and establish a plan to achieve them.    Participation Level:  Active  Participation Quality:  Appropriate  Affect:  Appropriate  Cognitive:  Appropriate  Insight: Appropriate  Engagement in Group:  Engaged  Modes of Intervention:  Discussion  Additional Comments:    Isidoro Margarita 10/28/2023, 8:45 PM

## 2023-10-28 NOTE — Progress Notes (Signed)
   10/27/23 2120  Psych Admission Type (Psych Patients Only)  Admission Status Voluntary  Psychosocial Assessment  Patient Complaints Anxiety;Depression  Eye Contact Fair  Facial Expression Anxious  Affect Anxious  Speech Soft  Interaction Needy  Motor Activity Slow  Appearance/Hygiene Unremarkable  Behavior Characteristics Cooperative;Anxious  Mood Pleasant  Aggressive Behavior  Effect No apparent injury  Thought Process  Coherency WDL  Content Preoccupation  Delusions None reported or observed  Perception WDL  Hallucination None reported or observed  Judgment WDL  Confusion WDL  Danger to Self  Current suicidal ideation? Passive  Self-Injurious Behavior No self-injurious ideation or behavior indicators observed or expressed   Agreement Not to Harm Self Yes  Description of Agreement Verbal  Danger to Others  Danger to Others None reported or observed

## 2023-10-28 NOTE — Progress Notes (Signed)
   10/28/23 2200  Psych Admission Type (Psych Patients Only)  Admission Status Voluntary  Psychosocial Assessment  Patient Complaints Anxiety;Depression  Eye Contact Fair  Facial Expression Sad  Affect Sad  Speech Logical/coherent  Interaction Assertive;Attention-seeking  Motor Activity Slow  Appearance/Hygiene In scrubs  Behavior Characteristics Cooperative  Mood Pleasant  Aggressive Behavior  Effect No apparent injury  Thought Process  Coherency WDL  Content Preoccupation  Delusions None reported or observed  Perception WDL  Hallucination None reported or observed  Judgment WDL  Confusion None  Danger to Self  Current suicidal ideation? Denies  Agreement Not to Harm Self Yes  Description of Agreement verbal  Danger to Others  Danger to Others None reported or observed

## 2023-10-28 NOTE — Progress Notes (Signed)
 Patient is pleasant and cooperative.  Sad affect.  Endorses anxiety and depression.  No crying spells when speaking with this Clinical research associate.  Denies SI/HI and AVH.  Denies pain.  Reports poor sleep. Complaints of dry mouth.   Compliant with scheduled medications.  15 min checks in place for safety.  Present in the milieu.  Patient came to the nurse's station crying and stating her depression is crippling, but stopped mid sentence and changed the subject.

## 2023-10-28 NOTE — Plan of Care (Signed)
 Patient alert and oriented. Denies SI, HI, AVH and pain at this time. Scheduled medications administered per MAR. Support and encouragement provided.    Routine safety checks conducted every 15 minutes.  Patient shared concerns of the need for a out patient psychiatrist, to be seen virtually when discharged d/t difficulties w/transportation.  Also, concerned w/insurance paying for outpatient services upon discharge.  No adverse drug reactions noted. Patient verbally contracts for safety at this time. Patient interacts well with others on the unit.  Patient remains safe at this time.  Problem: Education: Goal: Ability to state activities that reduce stress will improve Outcome: Progressing   Problem: Coping: Goal: Ability to identify and develop effective coping behavior will improve Outcome: Progressing   Problem: Self-Concept: Goal: Ability to identify factors that promote anxiety will improve Outcome: Progressing Goal: Level of anxiety will decrease Outcome: Progressing Goal: Ability to modify response to factors that promote anxiety will improve Outcome: Progressing

## 2023-10-28 NOTE — Group Note (Signed)
 Recreation Therapy Group Note   Group Topic:Other  Group Date: 10/28/2023 Start Time: 1400 End Time: 1445 Facilitators: Deatrice Factor, LRT, CTRS Location: Courtyard  Group Description: Music Reminisce. LRT encouraged patients to think of their favorite song(s) that reminded them of a positive memory or time in their life. LRT encouraged patient to talk about that memory aloud to the group. LRT played the song through a speaker for all to hear. LRT and patients discussed how thinking of a positive memory or time in their life can be used as a coping skill in everyday life post discharge.   Goal Area(s) Addressed: Patient will increase verbal communication by conversing with peers. Patient will contribute to group discussion with minimal prompting. Patient will reminisce a positive memory or moment in their life.    Affect/Mood: Appropriate   Participation Level: Active and Engaged   Participation Quality: Independent   Behavior: Appropriate, Calm, and Cooperative   Speech/Thought Process: Coherent   Insight: Good   Judgement: Good   Modes of Intervention: Guided Discussion and Music   Patient Response to Interventions:  Attentive, Engaged, Interested , and Receptive   Education Outcome:  Acknowledges education   Clinical Observations/Individualized Feedback: Varina was active in their participation of session activities and group discussion. Pt identified I love music from the 70's, 80's and 90's. Pt interacted well with LRT and peers duration of session.    Plan: Continue to engage patient in RT group sessions 2-3x/week.   Deatrice Factor, LRT, CTRS 10/28/2023 4:49 PM

## 2023-10-29 DIAGNOSIS — F314 Bipolar disorder, current episode depressed, severe, without psychotic features: Secondary | ICD-10-CM | POA: Diagnosis not present

## 2023-10-29 NOTE — Plan of Care (Signed)
  Problem: Education: Goal: Ability to state activities that reduce stress will improve Outcome: Progressing   Problem: Coping: Goal: Ability to identify and develop effective coping behavior will improve Outcome: Progressing   Problem: Self-Concept: Goal: Level of anxiety will decrease Outcome: Not Progressing

## 2023-10-29 NOTE — Group Note (Signed)
 LCSW Group Therapy Note  Group Date: 10/29/2023 Start Time: 1330 End Time: 1400   Type of Therapy and Topic:  Group Therapy - Healthy vs Unhealthy Coping Skills  Participation Level:  Active   Description of Group The focus of this group was to determine what unhealthy coping techniques typically are used by group members and what healthy coping techniques would be helpful in coping with various problems. Patients were guided in becoming aware of the differences between healthy and unhealthy coping techniques. Patients were asked to identify 2-3 healthy coping skills they would like to learn to use more effectively.  Therapeutic Goals Patients learned that coping is what human beings do all day long to deal with various situations in their lives Patients defined and discussed healthy vs unhealthy coping techniques Patients identified their preferred coping techniques and identified whether these were healthy or unhealthy Patients determined 2-3 healthy coping skills they would like to become more familiar with and use more often. Patients provided support and ideas to each other   Summary of Patient Progress:  During group, Mccartney expressed concern with her genetic predisposition to depression as well as inability to master coping skills (in her words). Patient proved open to input from peers and feedback from CSW. Patient demonstrated good insight into the subject matter, was respectful of peers, and participated throughout the entire session.   Therapeutic Modalities Cognitive Behavioral Therapy Motivational Interviewing  Claudio Culver, [place] 10/29/2023  2:53 PM

## 2023-10-29 NOTE — Group Note (Signed)
 Recreation Therapy Group Note   Group Topic:Other  Group Date: 10/29/2023 Start Time: 1400 End Time: 1450 Facilitators: Deatrice Factor, LRT, CTRS Location: Dayroom  Activity Description/Intervention: Therapeutic Drumming. Patients with peers and staff were given the opportunity to engage in a leader facilitated [facility] Group Empowerment Drumming Circle with staff from the [employer], in partnership with [employer]. Teaching laboratory technician and trained [employer], Kathlyne Parchment leading with LRT observing and documenting intervention and pt response. This evidenced-based practice targets 7 areas of health and wellbeing in the human experience including: stress-reduction, exercise, self-expression, camaraderie/support, nurturing, spirituality, and music-making (leisure).    Goal Area(s) Addresses:  Patient will engage in pro-social way in music group.  Patient will follow directions of drum leader on the first prompt. Patient will demonstrate no behavioral issues during group.  Patient will identify if a reduction in stress level occurs as a result of participation in therapeutic drum circle.     Affect/Mood: Appropriate   Participation Level: Active and Engaged   Participation Quality: Independent   Behavior: Appropriate, Calm, and Cooperative   Speech/Thought Process: Coherent   Insight: Good   Judgement: Good   Modes of Intervention: Music   Patient Response to Interventions:  Attentive, Engaged, Interested , and Receptive   Education Outcome:  Acknowledges education   Clinical Observations/Individualized Feedback: Dawn Kelley was active in their participation of session activities and group discussion. Pt interacted well with LRT and peers duration of session.    Plan: Continue to engage patient in RT group sessions 2-3x/week.   Deatrice Factor, LRT, CTRS 10/29/2023 5:09 PM

## 2023-10-29 NOTE — Group Note (Signed)
 Date:  10/29/2023 Time:  11:53 AM  Group Topic/Focus:  Recovery Goals:   The focus of this group is to identify appropriate goals for recovery and establish a plan to achieve them.    Participation Level:  Did Not Attend   Marianna Shirk Britainy Kozub 10/29/2023, 11:53 AM

## 2023-10-29 NOTE — Group Note (Signed)
 Date:  10/29/2023 Time:  10:02 PM  Group Topic/Focus:  Wrap-Up Group:   The focus of this group is to help patients review their daily goal of treatment and discuss progress on daily workbooks.    Participation Level:  Active  Participation Quality:  Appropriate  Affect:  Appropriate  Cognitive:  Appropriate  Insight: Appropriate  Engagement in Group:  Engaged  Modes of Intervention:  Discussion  Additional Comments:    Rolland Cline 10/29/2023, 10:02 PM

## 2023-10-29 NOTE — Progress Notes (Signed)
 Patient is pleasant and cooperative.  Anxious affect.  Crying spells observed.  Endorses anxiety and depression.  Denies SI/HI and AVH.  Denies pain.  Reports she had only 1 hour of sleep last night.   Compliant with scheduled medications.  PRN medication for anxiety given as ordered. 15 min checks in place for safety.  Patient complains of dry mouth and claustrophobia.  Present in the milieu for meals and groups. Attention seeking behaviors.    Patient encouraged to use her coping skills.

## 2023-10-29 NOTE — BH IP Treatment Plan (Signed)
 Interdisciplinary Treatment and Diagnostic Plan Update  10/29/2023 Time of Session: 9:00 AM  Enes Wegener MRN: [id number]  Principal Diagnosis: Bipolar affective disorder (HCC)  Secondary Diagnoses: Principal Problem:   Bipolar affective disorder (HCC)   Current Medications:  Current Facility-Administered Medications  Medication Dose Route Frequency Provider Last Rate Last Admin   alum & mag hydroxide-simeth (MAALOX/MYLANTA) 200-200-20 MG/5ML suspension 30 mL  30 mL Oral Q4H PRN McLauchlin, Angela, NP       antiseptic oral rinse (BIOTENE) solution 15 mL  15 mL Mouth Rinse PRN Jadapalle, Sree, MD   15 mL at 10/25/23 1542   busPIRone  (BUSPAR ) tablet 15 mg  15 mg Oral TID Jadapalle, Sree, MD   15 mg at 10/29/23 1028   feeding supplement (ENSURE PLUS HIGH PROTEIN) liquid 237 mL  237 mL Oral BID BM Jadapalle, Sree, MD   237 mL at 10/29/23 1038   FLUoxetine  (PROZAC ) capsule 10 mg  10 mg Oral Daily Shrivastava, Aryendra, MD   10 mg at 10/29/23 1028   gabapentin  (NEURONTIN ) capsule 300 mg  300 mg Oral TID Shrivastava, Aryendra, MD   300 mg at 10/29/23 1026   hydrOXYzine  (ATARAX ) tablet 50 mg  50 mg Oral Q6H PRN Jadapalle, Sree, MD   50 mg at 10/29/23 1230   lamoTRIgine  (LAMICTAL ) tablet 25 mg  25 mg Oral Daily Shrivastava, Aryendra, MD   25 mg at 10/29/23 1028   levothyroxine  (SYNTHROID ) tablet 25 mcg  25 mcg Oral Q0600 Jadapalle, Sree, MD   25 mcg at 10/29/23 0542   magnesium  hydroxide (MILK OF MAGNESIA) suspension 30 mL  30 mL Oral Daily PRN McLauchlin, Shelvy Dickens, NP   30 mL at 10/27/23 1214   midodrine (PROAMATINE) tablet 10 mg  10 mg Oral TID PRN Shrivastava, Aryendra, MD       multivitamin with minerals tablet 1 tablet  1 tablet Oral Daily Jadapalle, Sree, MD   1 tablet at 10/29/23 1027   OLANZapine  zydis (ZYPREXA ) disintegrating tablet 5 mg  5 mg Oral TID PRN Jadapalle, Sree, MD   5 mg at 10/22/23 1141   omega-3 acid ethyl esters (LOVAZA) capsule 1 g  1 g Oral BID Shrivastava, Aryendra, MD    1 g at 10/29/23 1027   pantoprazole  (PROTONIX ) EC tablet 40 mg  40 mg Oral Daily Jadapalle, Sree, MD   40 mg at 10/29/23 1110   pilocarpine (SALAGEN) tablet 5 mg  5 mg Oral BID Shrivastava, Aryendra, MD   5 mg at 10/29/23 1027   rosuvastatin  (CRESTOR ) tablet 20 mg  20 mg Oral Daily Jadapalle, Sree, MD   20 mg at 10/29/23 1026   PTA Medications: Medications Prior to Admission  Medication Sig Dispense Refill Last Dose/Taking   busPIRone  (BUSPAR ) 15 MG tablet Take 15 mg by mouth 3 (three) times daily.      clonazePAM  (KLONOPIN ) 0.5 MG tablet Take 0.5 mg by mouth 2 (two) times daily as needed for anxiety.      DULoxetine  (CYMBALTA ) 60 MG capsule Take 60 mg by mouth daily.      ibuprofen (ADVIL) 600 MG tablet Take 600 mg by mouth every 6 (six) hours as needed for mild pain (pain score 1-3) or moderate pain (pain score 4-6).      levothyroxine  (SYNTHROID ) 25 MCG tablet Take 25 mcg by mouth daily before breakfast.      LORazepam  (ATIVAN ) 1 MG tablet Take 1 tablet (1 mg total) by mouth 3 (three) times daily as needed for anxiety. 15 tablet  0    losartan  (COZAAR ) 25 MG tablet Take 25 mg by mouth daily.      pantoprazole  (PROTONIX ) 40 MG tablet Take 40 mg by mouth daily.      propranolol  (INDERAL ) 20 MG tablet Take 20 mg by mouth 3 (three) times daily.      rosuvastatin  (CRESTOR ) 20 MG tablet Take 20 mg by mouth daily.       Patient Stressors: Health problems    Patient Strengths: Manufacturing systems engineer  Supportive family/friends   Treatment Modalities: Medication Management, Group therapy, Case management,  1 to 1 session with clinician, Psychoeducation, Recreational therapy.   Physician Treatment Plan for Primary Diagnosis: Bipolar affective disorder (HCC) Long Term Goal(s): Improvement in symptoms so as ready for discharge   Short Term Goals: Ability to identify changes in lifestyle to reduce recurrence of condition will improve Ability to verbalize feelings will improve Ability to disclose  and discuss suicidal ideas Ability to demonstrate self-control will improve Ability to identify and develop effective coping behaviors will improve Ability to maintain clinical measurements within normal limits will improve Compliance with prescribed medications will improve Ability to identify triggers associated with substance abuse/mental health issues will improve  Medication Management: Evaluate patient's response, side effects, and tolerance of medication regimen.  Therapeutic Interventions: 1 to 1 sessions, Unit Group sessions and Medication administration.  Evaluation of Outcomes: Progressing  Physician Treatment Plan for Secondary Diagnosis: Principal Problem:   Bipolar affective disorder (HCC)  Long Term Goal(s): Improvement in symptoms so as ready for discharge   Short Term Goals: Ability to identify changes in lifestyle to reduce recurrence of condition will improve Ability to verbalize feelings will improve Ability to disclose and discuss suicidal ideas Ability to demonstrate self-control will improve Ability to identify and develop effective coping behaviors will improve Ability to maintain clinical measurements within normal limits will improve Compliance with prescribed medications will improve Ability to identify triggers associated with substance abuse/mental health issues will improve     Medication Management: Evaluate patient's response, side effects, and tolerance of medication regimen.  Therapeutic Interventions: 1 to 1 sessions, Unit Group sessions and Medication administration.  Evaluation of Outcomes: Progressing   RN Treatment Plan for Primary Diagnosis: Bipolar affective disorder (HCC) Long Term Goal(s): Knowledge of disease and therapeutic regimen to maintain health will improve  Short Term Goals: Ability to remain free from injury will improve, Ability to verbalize frustration and anger appropriately will improve, Ability to demonstrate self-control,  Ability to participate in decision making will improve, Ability to verbalize feelings will improve, Ability to disclose and discuss suicidal ideas, Ability to identify and develop effective coping behaviors will improve, and Compliance with prescribed medications will improve  Medication Management: RN will administer medications as ordered by provider, will assess and evaluate patient's response and provide education to patient for prescribed medication. RN will report any adverse and/or side effects to prescribing provider.  Therapeutic Interventions: 1 on 1 counseling sessions, Psychoeducation, Medication administration, Evaluate responses to treatment, Monitor vital signs and CBGs as ordered, Perform/monitor CIWA, COWS, AIMS and Fall Risk screenings as ordered, Perform wound care treatments as ordered.  Evaluation of Outcomes: Progressing   LCSW Treatment Plan for Primary Diagnosis: Bipolar affective disorder (HCC) Long Term Goal(s): Safe transition to appropriate next level of care at discharge, Engage patient in therapeutic group addressing interpersonal concerns.  Short Term Goals: Engage patient in aftercare planning with referrals and resources, Increase social support, Increase ability to appropriately verbalize feelings, Increase emotional regulation, Facilitate  acceptance of mental health diagnosis and concerns, Facilitate patient progression through stages of change regarding substance use diagnoses and concerns, Identify triggers associated with mental health/substance abuse issues, and Increase skills for wellness and recovery  Therapeutic Interventions: Assess for all discharge needs, 1 to 1 time with Social worker, Explore available resources and support systems, Assess for adequacy in community support network, Educate family and significant other(s) on suicide prevention, Complete Psychosocial Assessment, Interpersonal group therapy.  Evaluation of Outcomes:  Progressing   Progress in Treatment: Attending groups: Yes and No. Participating in groups: Yes and No. Taking medication as prescribed: Yes. Toleration medication: Yes. Family/Significant other contact made: No, will contact:  CSW will contact if given permission  Patient understands diagnosis: No. Discussing patient identified problems/goals with staff: No. Medical problems stabilized or resolved: Yes. Denies suicidal/homicidal ideation: No. Issues/concerns per patient self-inventory: No. Other: None    New problem(s) identified: No, Describe:  none identified Update 10/28/23: No changes at this time   New Short Term/Long Term Goal(s):  elimination of symptoms of psychosis, medication management for mood stabilization; elimination of SI thoughts; development of comprehensive mental wellness plan. Update 10/28/23: No changes at this time   Patient Goals:   I want to go home, severe depression, severe anxiety. I wish God would take me in my sleep Update 10/28/23: No changes at this time   Discharge Plan or Barriers: CSW to assist with appropriate discharge planning Update 10/28/23: No changes at this time   Reason for Continuation of Hospitalization: Anxiety Depression Medication stabilization Suicidal ideation   Estimated Length of Stay: 1 to 7 days Update 10/28/23: TBD  Last 3 [place] Suicide Severity Risk Score: Flowsheet Row Admission (Current) from 10/22/2023 in [facility] ED from 10/21/2023 in [facility] Emergency Department at [facility] ED from 10/14/2023 in [facility] Emergency Department at [facility]  C-SSRS RISK CATEGORY No Risk No Risk No Risk    Last [facility] 2/9 Scores:    10/01/2023    4:19 PM 11/27/2022   10:15 AM 11/19/2022    1:42 PM  Depression screen PHQ 2/9  Decreased Interest 3 3 3   Down, Depressed, Hopeless 3 3   PHQ - 2 Score 6 6 3   Altered sleeping 3 2 2   Tired, decreased energy 0 3 3  Change in appetite 1 3 3    Feeling bad or failure about yourself  0 3 3  Trouble concentrating 0 3 3  Moving slowly or fidgety/restless 0 2 0  Suicidal thoughts 0 0 0  PHQ-9 Score 10 22 17   Difficult doing work/chores Very difficult Very difficult Somewhat difficult    Scribe for Treatment Team: Claudio Culver, LCSWA 10/29/2023 3:00 PM

## 2023-10-29 NOTE — Progress Notes (Signed)
 [facility] MD Progress Note  10/29/2023 1:40 PM Dawn Kelley  MRN:  [id number] patient   Is a 63 year old female, with borderline personality disorder, benzodiazepine dependence and bipolar disorder per chart who was brought in by EMS after she was asked to be removed from the building she was in due to bizarre behavior. Patient is noted to be extremely anxious, making passive suicidal statements of not wanting to wake up after sleep. Husband reports overwhelmed with patient's anxiety. Patient was admitted to geropsych unit for further stabilization.   Subjective:     Per staff report patient had a difficult night and was noted to be having crying episode.  Patient has been yesterday.  Patient is eating well.  No agitation reported.  Patient on interview reports that she did not sleep well yesterday and only slept for 1 hours.  She reports generally she is sleep well this was 1 of the day only.   Discussed with patient that will continue to monitor her sleep closely. She reports that her mouth is not as dry.   Continues to report feeling depressed and anxious.  Patient feels that she will do better as now she is back on Prozac .  Sleep: Poor  Appetite:  Fair  Past Psychiatric History: see h&P Family History:  Family History  Problem Relation Age of Onset   Depression Mother    Pneumonia Mother    Social History:  Social History   Substance and Sexual Activity  Alcohol Use Yes   Alcohol/week: 10.0 standard drinks of alcohol   Types: 10 Cans of beer per week   Comment: daily, last drank last night     Social History   Substance and Sexual Activity  Drug Use Yes   Types: Marijuana   Comment: daily    Social History   Socioeconomic History   Marital status: Married    Spouse name: Not on file   Number of children: Not on file   Years of education: Not on file   Highest education level: Not on file  Occupational History   Occupation: Disabled  Tobacco Use   Smoking status: Never     Passive exposure: Current   Smokeless tobacco: Never  Vaping Use   Vaping status: Never Used  Substance and Sexual Activity   Alcohol use: Yes    Alcohol/week: 10.0 standard drinks of alcohol    Types: 10 Cans of beer per week    Comment: daily, last drank last night   Drug use: Yes    Types: Marijuana    Comment: daily   Sexual activity: Not Currently    Birth control/protection: Post-menopausal  Other Topics Concern   Not on file  Social History Narrative   Pt lives in [place] with husband.  She is on disability.  Pt stated that she receives outpatient psychiatry services through [facility].  She does not receive outpatient therapy.   Social Drivers of Corporate investment banker Strain: Low Risk  (10/23/2022)   Overall Financial Resource Strain (CARDIA)    Difficulty of Paying Living Expenses: Not hard at all  Food Insecurity: No Food Insecurity (10/22/2023)   Hunger Vital Sign    Worried About Running Out of Food in the Last Year: Never true    Ran Out of Food in the Last Year: Never true  Transportation Needs: No Transportation Needs (10/22/2023)   PRAPARE - Administrator, Civil Service (Medical): No    Lack of Transportation (Non-Medical): No  Physical Activity: Insufficiently Active (10/23/2022)   Exercise Vital Sign    Days of Exercise per Week: 2 days    Minutes of Exercise per Session: 10 min  Stress: No Stress Concern Present (10/23/2022)   [employer] of Occupational Health - Occupational Stress Questionnaire    Feeling of Stress : Not at all  Social Connections: Moderately Isolated (10/23/2022)   Social Connection and Isolation Panel    Frequency of Communication with Friends and Family: More than three times a week    Frequency of Social Gatherings with Friends and Family: More than three times a week    Attends Religious Services: Never    Database administrator or Organizations: No    Attends Engineer, structural: Never     Marital Status: Married   Past Medical History:  Past Medical History:  Diagnosis Date   Anxiety    Bipolar 1 disorder (HCC)    Depression    OCD (obsessive compulsive disorder)    PTSD (post-traumatic stress disorder)    S/P ECT (electroconvulsive therapy)     Past Surgical History:  Procedure Laterality Date   BIOPSY  02/03/2020   Procedure: BIOPSY;  Surgeon: Urban Garden, MD;  Location: [facility];  Service: Gastroenterology;;   COLONOSCOPY WITH PROPOFOL  N/A 02/03/2020   [name]: 3mm polyp in sigmoid colon  as well as hemorrhoids, histology consistent with tubular adenoma   ESOPHAGOGASTRODUODENOSCOPY (EGD) WITH PROPOFOL  N/A 02/03/2020   castaneda: small gastric polyps, sessile, and fundic gland, small bowel bx normal   INDUCED ABORTION N/A 1980   POLYPECTOMY  02/03/2020   Procedure: POLYPECTOMY;  Surgeon: Umberto Ganong, Bearl Limes, MD;  Location: [facility];  Service: Gastroenterology;;   VEIN SURGERY      Current Medications: Current Facility-Administered Medications  Medication Dose Route Frequency Provider Last Rate Last Admin   alum & mag hydroxide-simeth (MAALOX/MYLANTA) 200-200-20 MG/5ML suspension 30 mL  30 mL Oral Q4H PRN McLauchlin, Angela, NP       antiseptic oral rinse (BIOTENE) solution 15 mL  15 mL Mouth Rinse PRN Aurelia Blotter, MD   15 mL at 10/25/23 1542   busPIRone  (BUSPAR ) tablet 15 mg  15 mg Oral TID Jadapalle, Sree, MD   15 mg at 10/29/23 1028   feeding supplement (ENSURE PLUS HIGH PROTEIN) liquid 237 mL  237 mL Oral BID BM Jadapalle, Sree, MD   237 mL at 10/29/23 1038   FLUoxetine  (PROZAC ) capsule 10 mg  10 mg Oral Daily Regine Christian, MD   10 mg at 10/29/23 1028   gabapentin  (NEURONTIN ) capsule 300 mg  300 mg Oral TID Nadeen Shipman, MD   300 mg at 10/29/23 1026   hydrOXYzine  (ATARAX ) tablet 50 mg  50 mg Oral Q6H PRN Jadapalle, Sree, MD   50 mg at 10/29/23 1230   lamoTRIgine  (LAMICTAL ) tablet 25 mg  25 mg Oral Daily  Zaylei Mullane, MD   25 mg at 10/29/23 1028   levothyroxine  (SYNTHROID ) tablet 25 mcg  25 mcg Oral Q0600 Jadapalle, Sree, MD   25 mcg at 10/29/23 0542   magnesium  hydroxide (MILK OF MAGNESIA) suspension 30 mL  30 mL Oral Daily PRN McLauchlin, Shelvy Dickens, NP   30 mL at 10/27/23 1214   midodrine (PROAMATINE) tablet 10 mg  10 mg Oral TID PRN Kennen Stammer, MD       multivitamin with minerals tablet 1 tablet  1 tablet Oral Daily Jadapalle, Sree, MD   1 tablet at 10/29/23 1027  OLANZapine  zydis (ZYPREXA ) disintegrating tablet 5 mg  5 mg Oral TID PRN Jadapalle, Sree, MD   5 mg at 10/22/23 1141   omega-3 acid ethyl esters (LOVAZA) capsule 1 g  1 g Oral BID Keiondre Colee, MD   1 g at 10/29/23 1027   pantoprazole  (PROTONIX ) EC tablet 40 mg  40 mg Oral Daily Jadapalle, Sree, MD   40 mg at 10/29/23 1110   pilocarpine (SALAGEN) tablet 5 mg  5 mg Oral BID Penda Venturi, MD   5 mg at 10/29/23 1027   rosuvastatin  (CRESTOR ) tablet 20 mg  20 mg Oral Daily Jadapalle, Sree, MD   20 mg at 10/29/23 1026    Lab Results: No results found for this or any previous visit (from the past 48 hours).  Blood Alcohol level:  Lab Results  Component Value Date   [facility] <15 10/21/2023   ETH <15 10/14/2023    Metabolic Disorder Labs: Lab Results  Component Value Date   HGBA1C 5.7 (H) 11/27/2022   No results found for: PROLACTIN No results found for: CHOL, TRIG, HDL, CHOLHDL, VLDL, LDLCALC  Physical Findings: AIMS:  , ,  ,  ,    CIWA:  CIWA-Ar Total: 2 COWS:      Psychiatric Specialty Exam:  Presentation  General Appearance:  Casual  Eye Contact: Fleeting  Speech: Normal Rate  Speech Volume: Normal    Mood and Affect  Mood: Anxious  Affect: Inappropriate   Thought Process  Thought Processes: Irrevelant  Descriptions of Associations:Intact  Orientation:Full (Time, Place and Person)  Thought Content:Illogical  Hallucinations:No data  recorded  Ideas of Reference:None  Suicidal Thoughts:denies Homicidal Thoughts:No data recorded   Sensorium  Memory: Immediate Fair; Recent Fair; Remote Fair  Judgment: Impaired  Insight: Shallow   Executive Functions  Concentration: Poor  Attention Span: Poor  Recall: [employer] of Knowledge: Fair  Language: Fair   Psychomotor Activity  Psychomotor Activity: No data recorded  Musculoskeletal: Strength & Muscle Tone: within normal limits Gait & Station: normal Assets  Assets: Manufacturing systems engineer; Desire for Improvement; Social Support; Resilience    Physical Exam: Physical Exam Physical Exam Constitutional:      Appearance: Normal appearance.  HENT:     Head: Normocephalic and atraumatic.     Right Ear: External ear normal.     Left Ear: External ear normal.     Nose: Nose normal.     Mouth/Throat:     Mouth: Mucous membranes are moist.  Eyes:     Extraocular Movements: Extraocular movements intact.     Pupils: Pupils are equal, round, and reactive to light.  Cardiovascular:     Rate and Rhythm: Normal rate.  Pulmonary:     Effort: Pulmonary effort is normal.     Breath sounds: Normal breath sounds.  Abdominal:     Palpations: Abdomen is soft.  Musculoskeletal:        General: Normal range of motion.     Cervical back: Normal range of motion and neck supple.  Skin:    General: Skin is warm.  Neurological:     General: No focal deficit present.     Mental Status:  alert.    ROS Review of Systems  All other systems reviewed and are negative.  Blood pressure (!) 117/59, pulse 85, temperature 98 F (36.7 C), resp. rate 20, height 5' 6 (1.676 m), weight 79.8 kg, SpO2 96%. Body mass index is 28.41 kg/m.  Diagnosis: Principal Problem:   Bipolar affective  disorder (HCC) Generalized anxiety disorder with panic attacks Borderline personality disorder PTSD, chronic Cannabis use disorder, moderate Alcohol use disorder, moderate,  currently in withdrawal   Clinical Decision Making: Patient with history of borderline personality, depression and anxiety, recent increasing use of alcohol and history of cannabis use presented to emergency room with worsening anxiety, mood lability and bizarre behavior.  Patient is currently admitted to geropsych unit.  Patient is endorsing passive suicidal ideation with no intent or plan.  Patient is noted to be withdrawing from alcohol and displays severe anxiety.  She needs ongoing inpatient hospitalization.   Treatment Plan Summary:   Safety and Monitoring:             -- Voluntary admission to inpatient psychiatric unit for safety, stabilization and treatment             -- Daily contact with patient to assess and evaluate symptoms and progress in treatment             -- Patient's case to be discussed in multi-disciplinary team meeting             -- Observation Level: q15 minute checks             -- Vital signs:  q12 hours             -- Precautions: suicide, elopement, and assault   2. Psychiatric Diagnoses and Treatment:               Discontinue Cymbalta  on 10/26/2023.   Started patient on Prozac  on 10/28/2023. Continue Lamictal  25 mg daily. Continue omega-3 fatty acids Would recommend DBT therapy and outpatient basis.     Continue gabapentin  to 300 mg 3 times daily to help with anxiety/mood  Patient's husband reports Klonopin  has been discontinued by the previous provider few months ago. Continue BuSpar  15 mg 3 times daily   -- The risks/benefits/side-effects/alternatives to this medication were discussed in detail with the patient and time was given for questions. The patient consents to medication trial.                -- Metabolic profile and EKG monitoring obtained while on an atypical antipsychotic (BMI: Lipid Panel: HbgA1c: QTc:)              -- Encouraged patient to participate in unit milieu and in scheduled group therapies                            3. Medical  Issues Being Addressed:  CIWA protocol to monitor alcohol withdrawal with librium  taper  Significant dry mouth- xerostomia started patient on pilocarpine 5 mg b.I.d..  4. Discharge Planning:   -- Social work and case management to assist with discharge planning and identification of hospital follow-up needs prior to discharge  -- Estimated LOS: 3-4 days  Albert Huff, MD 10/29/2023, 1:40 PM

## 2023-10-29 NOTE — Plan of Care (Signed)

## 2023-10-30 DIAGNOSIS — F314 Bipolar disorder, current episode depressed, severe, without psychotic features: Secondary | ICD-10-CM | POA: Diagnosis not present

## 2023-10-30 MED ORDER — LAMOTRIGINE 25 MG PO TABS
25.0000 mg | ORAL_TABLET | Freq: Two times a day (BID) | ORAL | Status: DC
  Administered 2023-10-30 – 2023-11-01 (×2): 25 mg via ORAL
  Filled 2023-10-30 (×4): qty 1

## 2023-10-30 MED ORDER — NAPROXEN 250 MG PO TABS
250.0000 mg | ORAL_TABLET | Freq: Once | ORAL | Status: AC
  Administered 2023-10-30: 250 mg via ORAL
  Filled 2023-10-30: qty 1

## 2023-10-30 MED ORDER — FLUOXETINE HCL 20 MG PO CAPS
20.0000 mg | ORAL_CAPSULE | Freq: Every day | ORAL | Status: DC
  Administered 2023-11-01: 20 mg via ORAL
  Filled 2023-10-30 (×2): qty 1

## 2023-10-30 MED ORDER — PILOCARPINE HCL 5 MG PO TABS
5.0000 mg | ORAL_TABLET | Freq: Every day | ORAL | Status: DC
  Filled 2023-10-30: qty 1

## 2023-10-30 NOTE — Group Note (Signed)
 Physical/Occupational Therapy Group Note  Group Topic: Yoga  Group Date: 10/30/2023 Start Time: 1300 End Time: 1333 Facilitators: Guillermo Difrancesco, Otelia Blew, PT   Group Description: Group participated with series of yoga poses, designed to emphasize functional sitting balance, core stability, generalized flexibility and overall posture.  Incorporated deep breathing techniques with poses, working to promote relaxation, mindfulness and focus with targeted activities.   Discussed benefits of yoga in improving mood and self-esteem, reducing stress and anxiety, and promoting functional strength and balance for each participant.  Discussed ways to integrate into each participant's daily routine.  Provided handout with written and pictorial descriptions of included yoga movements to be utilized as appropriate outside of group time.  Therapeutic Goal(s):  Demonstrate safe ability to participate with yoga poses during group activity. Identify one benefit of participation with yoga poses as part of each participant's exercise/movement routine. Identify 1-2 individual poses that participant feels most beneficial to his/her needs and that he/she can easily replicate outside of group.  Individual Participation: Pt participated actively and appropriately both during the discussion and activity portions of the session.  Participation Level: Active and Engaged   Participation Quality: Minimal Cues   Behavior: Alert and Appropriate   Speech/Thought Process: Coherent, Focused, and Relevant   Affect/Mood: Appropriate   Insight: Good   Judgement: Good   Modes of Intervention: Activity, Discussion, and Education  Patient Response to Interventions:  Attentive, Engaged, Interested , and Receptive   Plan: Continue to engage patient in PT/OT groups 1 - 2x/week.  Lavenia Post PT, DPT 10/30/23, 5:15 PM

## 2023-10-30 NOTE — Progress Notes (Signed)
 [facility] MD Progress Note  10/30/2023 11:20 AM Dawn Kelley  MRN:  [id number] patient   Is a 63 year old female, with borderline personality disorder, benzodiazepine dependence and bipolar disorder per chart who was brought in by EMS after she was asked to be removed from the building she was in due to bizarre behavior. Patient is noted to be extremely anxious, making passive suicidal statements of not wanting to wake up after sleep. Husband reports overwhelmed with patient's anxiety. Patient was admitted to geropsych unit for further stabilization.   Subjective:     Chart is reviewed and discussed the case with treatment team.  Per nursing report patient continues to display attention seeking behaviors where she is noted to be laughing and interacting well with her peers but the manage she sees a provider or a nurse she starts shaking saying she is severely depressed and nothing is working.  Today on interview patient reports feeling scared of transitioning from hospital to home as she feels her anxiety is still ongoing.  Provider educated patient about multiple trial of medications and titration of dosages with fair response to the current medication regimen that she is noted to be coming out of her room more participating in groups, interacting well with peers.  Provider also educated about getting connected with intensive outpatient services including DBT therapy and possible CST given the number of hospitalizations and ED visits she had in her life course.  Provider educated her to continue to stay sober from alcohol and benzos that probably are contributing to her chronic anxiety and restlessness.  Patient denies suicidal/homicidal ideation/intent/plan and denies auditory/visual hallucinations.  She reports her husband is waiting for her to come back home.  Patient reports her coping skills are not that great and patient encouraged that she will be provided all the postdischarge support through intensive  outpatient programs.  Patient had no further questions or concerns.  Provider discussed postdischarge appointments and planning with the social work team.  Per nursing patient is taking her medications with no reported side effects and maintain safe behaviors since admission.  Sleep: Poor  Appetite:  Fair  Past Psychiatric History: see h&P Family History:  Family History  Problem Relation Age of Onset   Depression Mother    Pneumonia Mother    Social History:  Social History   Substance and Sexual Activity  Alcohol Use Yes   Alcohol/week: 10.0 standard drinks of alcohol   Types: 10 Cans of beer per week   Comment: daily, last drank last night     Social History   Substance and Sexual Activity  Drug Use Yes   Types: Marijuana   Comment: daily    Social History   Socioeconomic History   Marital status: Married    Spouse name: Not on file   Number of children: Not on file   Years of education: Not on file   Highest education level: Not on file  Occupational History   Occupation: Disabled  Tobacco Use   Smoking status: Never    Passive exposure: Current   Smokeless tobacco: Never  Vaping Use   Vaping status: Never Used  Substance and Sexual Activity   Alcohol use: Yes    Alcohol/week: 10.0 standard drinks of alcohol    Types: 10 Cans of beer per week    Comment: daily, last drank last night   Drug use: Yes    Types: Marijuana    Comment: daily   Sexual activity: Not Currently    Birth  control/protection: Post-menopausal  Other Topics Concern   Not on file  Social History Narrative   Pt lives in [place] with husband.  She is on disability.  Pt stated that she receives outpatient psychiatry services through [facility].  She does not receive outpatient therapy.   Social Drivers of Corporate investment banker Strain: Low Risk  (10/23/2022)   Overall Financial Resource Strain (CARDIA)    Difficulty of Paying Living Expenses: Not hard at all  Food  Insecurity: No Food Insecurity (10/22/2023)   Hunger Vital Sign    Worried About Running Out of Food in the Last Year: Never true    Ran Out of Food in the Last Year: Never true  Transportation Needs: No Transportation Needs (10/22/2023)   PRAPARE - Administrator, Civil Service (Medical): No    Lack of Transportation (Non-Medical): No  Physical Activity: Insufficiently Active (10/23/2022)   Exercise Vital Sign    Days of Exercise per Week: 2 days    Minutes of Exercise per Session: 10 min  Stress: No Stress Concern Present (10/23/2022)   [employer] of Occupational Health - Occupational Stress Questionnaire    Feeling of Stress : Not at all  Social Connections: Moderately Isolated (10/23/2022)   Social Connection and Isolation Panel    Frequency of Communication with Friends and Family: More than three times a week    Frequency of Social Gatherings with Friends and Family: More than three times a week    Attends Religious Services: Never    Database administrator or Organizations: No    Attends Engineer, structural: Never    Marital Status: Married   Past Medical History:  Past Medical History:  Diagnosis Date   Anxiety    Bipolar 1 disorder (HCC)    Depression    OCD (obsessive compulsive disorder)    PTSD (post-traumatic stress disorder)    S/P ECT (electroconvulsive therapy)     Past Surgical History:  Procedure Laterality Date   BIOPSY  02/03/2020   Procedure: BIOPSY;  Surgeon: Urban Garden, MD;  Location: [facility];  Service: Gastroenterology;;   COLONOSCOPY WITH PROPOFOL  N/A 02/03/2020   [name]: 3mm polyp in sigmoid colon  as well as hemorrhoids, histology consistent with tubular adenoma   ESOPHAGOGASTRODUODENOSCOPY (EGD) WITH PROPOFOL  N/A 02/03/2020   castaneda: small gastric polyps, sessile, and fundic gland, small bowel bx normal   INDUCED ABORTION N/A 1980   POLYPECTOMY  02/03/2020   Procedure: POLYPECTOMY;  Surgeon:  Umberto Ganong, Bearl Limes, MD;  Location: [facility];  Service: Gastroenterology;;   VEIN SURGERY      Current Medications: Current Facility-Administered Medications  Medication Dose Route Frequency Provider Last Rate Last Admin   alum & mag hydroxide-simeth (MAALOX/MYLANTA) 200-200-20 MG/5ML suspension 30 mL  30 mL Oral Q4H PRN McLauchlin, Angela, NP       antiseptic oral rinse (BIOTENE) solution 15 mL  15 mL Mouth Rinse PRN Aurelia Blotter, MD   15 mL at 10/25/23 1542   busPIRone  (BUSPAR ) tablet 15 mg  15 mg Oral TID Kaulder Zahner, MD   15 mg at 10/30/23 0913   feeding supplement (ENSURE PLUS HIGH PROTEIN) liquid 237 mL  237 mL Oral BID BM Craigory Toste, MD   237 mL at 10/29/23 1038   [START ON 10/31/2023] FLUoxetine  (PROZAC ) capsule 20 mg  20 mg Oral Daily Xela Oregel, MD       gabapentin  (NEURONTIN ) capsule 300 mg  300 mg  Oral TID Shrivastava, Aryendra, MD   300 mg at 10/30/23 0915   hydrOXYzine  (ATARAX ) tablet 50 mg  50 mg Oral Q6H PRN Ivy Meriwether, MD   50 mg at 10/29/23 2119   lamoTRIgine  (LAMICTAL ) tablet 25 mg  25 mg Oral BID Rin Gorton, MD       levothyroxine  (SYNTHROID ) tablet 25 mcg  25 mcg Oral Q0600 Aara Jacquot, MD   25 mcg at 10/30/23 0548   magnesium  hydroxide (MILK OF MAGNESIA) suspension 30 mL  30 mL Oral Daily PRN McLauchlin, Shelvy Dickens, NP   30 mL at 10/27/23 1214   midodrine (PROAMATINE) tablet 10 mg  10 mg Oral TID PRN Shrivastava, Aryendra, MD       multivitamin with minerals tablet 1 tablet  1 tablet Oral Daily Arvine Clayburn, MD   1 tablet at 10/30/23 0913   OLANZapine  zydis (ZYPREXA ) disintegrating tablet 5 mg  5 mg Oral TID PRN Leighana Neyman, MD   5 mg at 10/29/23 2119   omega-3 acid ethyl esters (LOVAZA) capsule 1 g  1 g Oral BID Shrivastava, Aryendra, MD   1 g at 10/29/23 1027   pantoprazole  (PROTONIX ) EC tablet 40 mg  40 mg Oral Daily Heba Ige, MD   40 mg at 10/30/23 0914   pilocarpine (SALAGEN) tablet 5 mg  5 mg Oral BID Shrivastava,  Aryendra, MD   5 mg at 10/30/23 1610   rosuvastatin  (CRESTOR ) tablet 20 mg  20 mg Oral Daily Zanyiah Posten, MD   20 mg at 10/30/23 0915    Lab Results: No results found for this or any previous visit (from the past 48 hours).  Blood Alcohol level:  Lab Results  Component Value Date   [facility] <15 10/21/2023   ETH <15 10/14/2023    Metabolic Disorder Labs: Lab Results  Component Value Date   HGBA1C 5.7 (H) 11/27/2022   No results found for: PROLACTIN No results found for: CHOL, TRIG, HDL, CHOLHDL, VLDL, LDLCALC  Physical Findings: AIMS:  , ,  ,  ,    CIWA:  CIWA-Ar Total: 2 COWS:      Psychiatric Specialty Exam:  Presentation  General Appearance:  Casual  Eye Contact: Fleeting  Speech: Clear and Coherent  Speech Volume: Normal    Mood and Affect  Mood: Anxious  Affect: Appropriate   Thought Process  Thought Processes: Coherent  Descriptions of Associations:Intact  Orientation:Full (Time, Place and Person)  Thought Content:Illogical  Hallucinations:Hallucinations: None   Ideas of Reference:None  Suicidal Thoughts:denies Homicidal Thoughts:Homicidal Thoughts: No    Sensorium  Memory: Immediate Fair; Recent Fair; Remote Fair  Judgment: Impaired  Insight: Shallow   Executive Functions  Concentration: Fair  Attention Span: Fair  Recall: [employer] of Knowledge: Fair  Language: Fair   Psychomotor Activity  Psychomotor Activity: Psychomotor Activity: Normal   Musculoskeletal: Strength & Muscle Tone: within normal limits Gait & Station: normal Assets  Assets: Manufacturing systems engineer; Desire for Improvement; Physical Health; Resilience    Physical Exam: Physical Exam Physical Exam Constitutional:      Appearance: Normal appearance.  HENT:     Head: Normocephalic and atraumatic.     Right Ear: External ear normal.     Left Ear: External ear normal.     Nose: Nose normal.     Mouth/Throat:      Mouth: Mucous membranes are moist.  Eyes:     Extraocular Movements: Extraocular movements intact.     Pupils: Pupils are equal, round, and reactive to  light.  Cardiovascular:     Rate and Rhythm: Normal rate.  Pulmonary:     Effort: Pulmonary effort is normal.     Breath sounds: Normal breath sounds.  Abdominal:     Palpations: Abdomen is soft.  Musculoskeletal:        General: Normal range of motion.     Cervical back: Normal range of motion and neck supple.  Skin:    General: Skin is warm.  Neurological:     General: No focal deficit present.     Mental Status:  alert.    ROS Review of Systems  All other systems reviewed and are negative.  Blood pressure 104/61, pulse 64, temperature (!) 97.2 F (36.2 C), resp. rate 16, height 5' 6 (1.676 m), weight 79.8 kg, SpO2 96%. Body mass index is 28.41 kg/m.  Diagnosis: Principal Problem:   Bipolar affective disorder (HCC) Generalized anxiety disorder with panic attacks Borderline personality disorder PTSD, chronic Cannabis use disorder, moderate Alcohol use disorder, moderate, currently in withdrawal   Clinical Decision Making: Patient with history of borderline personality, depression and anxiety, recent increasing use of alcohol and history of cannabis use presented to emergency room with worsening anxiety, mood lability and bizarre behavior.  Patient is currently admitted to geropsych unit.  Patient is endorsing passive suicidal ideation with no intent or plan.  Patient is noted to be withdrawing from alcohol and displays severe anxiety.  She needs ongoing inpatient hospitalization.   Treatment Plan Summary:   Safety and Monitoring:             -- Voluntary admission to inpatient psychiatric unit for safety, stabilization and treatment             -- Daily contact with patient to assess and evaluate symptoms and progress in treatment             -- Patient's case to be discussed in multi-disciplinary team meeting              -- Observation Level: q15 minute checks             -- Vital signs:  q12 hours             -- Precautions: suicide, elopement, and assault   2. Psychiatric Diagnoses and Treatment:               Discontinue Cymbalta  on 10/26/2023. Prozac  increased to 20 mg daily Increased Lamictal  25 mg BID Continue omega-3 fatty acids Would recommend DBT therapy and outpatient basis.     Continue gabapentin  to 300 mg 3 times daily to help with anxiety/mood  Patient's husband reports Klonopin  has been discontinued by the previous provider few months ago. Continue BuSpar  15 mg 3 times daily   -- The risks/benefits/side-effects/alternatives to this medication were discussed in detail with the patient and time was given for questions. The patient consents to medication trial.                -- Metabolic profile and EKG monitoring obtained while on an atypical antipsychotic (BMI: Lipid Panel: HbgA1c: QTc:)              -- Encouraged patient to participate in unit milieu and in scheduled group therapies                            3. Medical Issues Being Addressed:  CIWA protocol to monitor alcohol withdrawal with librium  taper  Significant dry mouth- xerostomia started patient on pilocarpine 5 mg b.I.d-will reduce the dose as patient is reporting excessive sedation/sleepiness.  4. Discharge Planning:   -- Social work and case management to assist with discharge planning and identification of hospital follow-up needs prior to discharge  -- Estimated LOS: 3-4 days  Arley Salamone, MD 10/30/2023, 11:20 AM

## 2023-10-30 NOTE — Progress Notes (Signed)
 Patient is a voluntary admission to [facility] for MDD, Bipolar and Borderline personality disorder with anxiety and depression.  Patient is in the discharge phase with expected discharge Friday.  Patient expressed anxiety and depression today - denies SI, HI, AVH. Received two prn hydroxyzine  today for her anxiety.  Participated in one group activity.  Performs her own adls.  Interacts well with peers and is tearful most of the time with staff.  Will continue to monitor.

## 2023-10-30 NOTE — Group Note (Signed)
 Date:  10/30/2023 Time:  11:35 PM  Group Topic/Focus:  Wrap-Up Group:   The focus of this group is to help patients review their daily goal of treatment and discuss progress on daily workbooks.    Participation Level:  Active  Participation Quality:  Appropriate  Affect:  Appropriate  Cognitive:  Appropriate  Insight: Appropriate  Engagement in Group:  Engaged  Modes of Intervention:  Discussion  Bradley Caffey 10/30/2023, 11:35 PM

## 2023-10-30 NOTE — Progress Notes (Signed)
   10/29/23 2230  Psych Admission Type (Psych Patients Only)  Admission Status Voluntary  Psychosocial Assessment  Patient Complaints Anxiety;Crying spells;Depression  Eye Contact Fair  Facial Expression Anxious  Affect Sad;Sullen;Anxious  Speech Logical/coherent  Interaction Attention-seeking  Motor Activity Slow  Appearance/Hygiene In scrubs  Behavior Characteristics Cooperative;Anxious  Mood Depressed;Sad;Anxious  Aggressive Behavior  Effect No apparent injury  Thought Process  Coherency WDL  Content Ambivalence;Blaming others  Delusions None reported or observed  Perception WDL  Hallucination None reported or observed  Judgment WDL  Confusion None  Danger to Self  Current suicidal ideation? Denies  Self-Injurious Behavior No self-injurious ideation or behavior indicators observed or expressed   Agreement Not to Harm Self Yes  Description of Agreement Verbal  Danger to Others  Danger to Others None reported or observed   Patient C/O inability to sleep and states she slept only for an hour the previous night of 10/28/23. She was agreeable to take her night time meds with the exception of the Omega 3 which she states prevents her from sleeping because it causes a lot of burping. I also gave her the PRN meds ordered for her anxiety,

## 2023-10-30 NOTE — Group Note (Signed)
 Date:  10/30/2023 Time:  10:51 AM  Group Topic/Focus:  Fresh air Therapy Outdoors, with Music and conversation    Participation Level:  Did Not Attend    Merton Abts 10/30/2023, 10:51 AM

## 2023-10-31 DIAGNOSIS — F314 Bipolar disorder, current episode depressed, severe, without psychotic features: Secondary | ICD-10-CM | POA: Diagnosis not present

## 2023-10-31 MED ORDER — OMEGA-3-ACID ETHYL ESTERS 1 G PO CAPS: 1.0000 g | ORAL_CAPSULE | Freq: Two times a day (BID) | ORAL | 0 refills | Status: AC

## 2023-10-31 MED ORDER — GABAPENTIN 300 MG PO CAPS: 300.0000 mg | ORAL_CAPSULE | Freq: Three times a day (TID) | ORAL | 0 refills | Status: DC

## 2023-10-31 MED ORDER — LEVOTHYROXINE SODIUM 25 MCG PO TABS: 25.0000 ug | ORAL_TABLET | Freq: Every day | ORAL | 0 refills | Status: DC

## 2023-10-31 MED ORDER — ADULT MULTIVITAMIN W/MINERALS CH: 1.0000 | ORAL_TABLET | Freq: Every day | ORAL | 0 refills | Status: DC

## 2023-10-31 MED ORDER — LAMOTRIGINE 25 MG PO TABS
25.0000 mg | ORAL_TABLET | Freq: Two times a day (BID) | ORAL | 0 refills | Status: DC
Start: 2023-11-01 — End: 2024-04-03

## 2023-10-31 MED ORDER — FLUOXETINE HCL 20 MG PO CAPS: 20.0000 mg | ORAL_CAPSULE | Freq: Every day | ORAL | 0 refills | Status: DC

## 2023-10-31 MED ORDER — BIOTENE DRY MOUTH MT LIQD: 15.0000 mL | OROMUCOSAL | 0 refills | Status: AC | PRN

## 2023-10-31 NOTE — [facility] Suicide Risk Assessment (Signed)
 [facility] Discharge Suicide Risk Assessment   Principal Problem: Bipolar affective disorder [facility]) Discharge Diagnoses: Principal Problem:   Bipolar affective disorder (HCC)   Total Time spent with patient: 30 minutes  Musculoskeletal: Strength & Muscle Tone: within normal limits Gait & Station: normal Patient leans: N/A  Psychiatric Specialty Exam  Presentation  General Appearance:  Casual  Eye Contact: Fleeting  Speech: Clear and Coherent  Speech Volume: Normal  Handedness: Right   Mood and Affect  Mood: Anxious  Duration of Depression Symptoms: No data recorded Affect: Appropriate   Thought Process  Thought Processes: Coherent  Descriptions of Associations:Intact  Orientation:Full (Time, Place and Person)  Thought Content:Illogical  History of Schizophrenia/Schizoaffective disorder:No data recorded Duration of Psychotic Symptoms:No data recorded Hallucinations:Hallucinations: None  Ideas of Reference:None  Suicidal Thoughts:Suicidal Thoughts: No  Homicidal Thoughts:Homicidal Thoughts: No   Sensorium  Memory: Immediate Fair; Recent Fair; Remote Fair  Judgment: Impaired  Insight: Shallow   Executive Functions  Concentration: Fair  Attention Span: Fair  Recall: [employer] of Knowledge: Fair  Language: Fair   Psychomotor Activity  Psychomotor Activity: Psychomotor Activity: Normal   Assets  Assets: Communication Skills; Desire for Improvement; Physical Health; Resilience   Sleep  Sleep: Sleep: Fair  Estimated Sleeping Duration (Last 24 Hours): 10.00-11.75 hours  Physical Exam: Physical Exam ROS Blood pressure (!) 156/77, pulse 70, temperature 98.4 F (36.9 C), temperature source Oral, resp. rate 17, height 5' 6 (1.676 m), weight 79.8 kg, SpO2 100%. Body mass index is 28.41 kg/m.  Mental Status Per Nursing Assessment::   On Admission:  NA  Demographic Factors:  Low socioeconomic status  Loss  Factors: Decrease in vocational status  Historical Factors: Impulsivity  Risk Reduction Factors:   Living with another person, especially a relative, Positive social support, Positive therapeutic relationship, and Positive coping skills or problem solving skills  Continued Clinical Symptoms:  Depression:   Impulsivity  Cognitive Features That Contribute To Risk:  None    Suicide Risk:  Minimal: No identifiable suicidal ideation.  Patients presenting with no risk factors but with morbid ruminations; may be classified as minimal risk based on the severity of the depressive symptoms    Plan Of Care/Follow-up recommendations:  Activity:  As tolerated  Arraya Buck, MD 10/31/2023, 10:00 PM

## 2023-10-31 NOTE — Progress Notes (Signed)
   10/31/23 0500  Psych Admission Type (Psych Patients Only)  Admission Status Voluntary  Psychosocial Assessment  Patient Complaints Anxiety;Crying spells;Depression;Worrying  Eye Contact Fair  Facial Expression Anxious  Affect Anxious;Sullen;Sad;Depressed  Speech Logical/coherent  Interaction Attention-seeking  Motor Activity Slow  Appearance/Hygiene In scrubs  Behavior Characteristics Cooperative;Anxious  Mood Depressed;Anxious;Sad  Aggressive Behavior  Effect No apparent injury  Thought Process  Coherency WDL  Content Preoccupation  Delusions None reported or observed  Perception WDL  Hallucination None reported or observed  Judgment WDL  Confusion None  Danger to Self  Current suicidal ideation? Denies  Agreement Not to Harm Self Yes  Description of Agreement Verbal

## 2023-10-31 NOTE — Plan of Care (Signed)
 Pt received all medications as scheduled w/o incident.  Denies SI/HI/AVH.  Requested pain medication for generalized pain resulting from previous exercise during the day. One time dose Naproxen given with effective results.  Given Zyprexa  for agitation concerning the inability to sleep.  Effective with no further complaints nor agitation noted.  Slept throughout the night.

## 2023-10-31 NOTE — Group Note (Signed)
 Recreation Therapy Group Note   Group Topic:Self-Esteem  Group Date: 10/31/2023 Start Time: 1400 End Time: 1450 Facilitators: Deatrice Factor, LRT, CTRS Location: Dayroom  Group Description: Positive Focus. Patients are given a handout that has 9 different boxes on it. Each box has a different prompt on it that requires you to identify and add something positive about themselves. LRT encourages pts to share at least two of their boxes to the group. LRT and pts discuss the importance of "thinking positive", self-esteem and how they can apply it to their everyday life post-discharge.  Goal Area(s) Addressed: Patient will identify positive qualities about themselves. Patient will identify definition of "self-esteem". Patients will identify the difference between positive and negative self-esteem.  Patient will recite positive qualities and affirmations aloud to the group.   Affect/Mood: Appropriate   Participation Level: Active and Engaged   Participation Quality: Independent   Behavior: Alert and Appropriate   Speech/Thought Process: Coherent   Insight: Good   Judgement: Good   Modes of Intervention: Education, Exploration, Worksheet, and Writing   Patient Response to Interventions:  Attentive, Engaged, Interested , and Receptive   Education Outcome:  Acknowledges education   Clinical Observations/Individualized Feedback: Dawn Kelley was active in their participation of session activities and group discussion. Pt identified cooking is something I do well and a responsibility I have is feeding [name]. Pt interacted well with LRT and peers duration of session.    Plan: Continue to engage patient in RT group sessions 2-3x/week.   Deatrice Factor, LRT, CTRS 10/31/2023 5:11 PM

## 2023-10-31 NOTE — Progress Notes (Signed)
   10/31/23 2300  Psych Admission Type (Psych Patients Only)  Admission Status Voluntary  Psychosocial Assessment  Patient Complaints Anxiety  Eye Contact Fair  Facial Expression Anxious  Affect Anxious  Speech Logical/coherent  Interaction Attention-seeking  Motor Activity Slow  Appearance/Hygiene In scrubs  Behavior Characteristics Cooperative  Mood Anxious;Depressed  Aggressive Behavior  Effect No apparent injury  Thought Process  Coherency WDL  Content Preoccupation  Delusions None reported or observed  Perception WDL  Hallucination None reported or observed  Judgment WDL  Confusion None  Danger to Self  Current suicidal ideation? Denies  Self-Injurious Behavior No self-injurious ideation or behavior indicators observed or expressed   Agreement Not to Harm Self Yes  Description of Agreement verbal  Danger to Others  Danger to Others None reported or observed

## 2023-10-31 NOTE — Progress Notes (Signed)
   10/31/23 1000  Charting Type  Charting Type Shift assessment  Safety Check Verification  Has the RN verified the 15 minute safety check completion? Yes  Neurological  Neuro (WDL) WDL  Neuro Symptoms Anxiety;Depression  HEENT  HEENT (WDL) WDL  Respiratory  Respiratory (WDL) WDL  Cardiac  Cardiac (WDL) WDL  Vascular  Vascular (WDL) WDL  Integumentary  Integumentary (WDL) WDL  Braden Scale (Ages 8 and up)  Sensory Perceptions 4  Moisture 4  Activity 4  Mobility 4  Nutrition 3  Friction and Shear 3  Braden Scale Score 22  Musculoskeletal  Musculoskeletal (WDL) WDL  Gastrointestinal  Gastrointestinal (WDL) WDL  Last BM Date  10/30/23  GU Assessment  Genitourinary (WDL) WDL  Neurological  Level of Consciousness Alert

## 2023-10-31 NOTE — Progress Notes (Signed)
 [facility] MD Progress Note  10/31/2023 9:43 PM Dawn Kelley  MRN:  [id number] patient   Is a 63 year old female, with borderline personality disorder, benzodiazepine dependence and bipolar disorder per chart who was brought in by EMS after she was asked to be removed from the building she was in due to bizarre behavior. Patient is noted to be extremely anxious, making passive suicidal statements of not wanting to wake up after sleep. Husband reports overwhelmed with patient's anxiety. Patient was admitted to geropsych unit for further stabilization.   Subjective:     Chart is reviewed and discussed the case with treatment team.  Patient is noted to be resting in her bed.  She reports that she has been attending some of the groups and interacting with peers.  Patient and provider discussed about her post discharge planning.  Patient reports that she was seeing a provider that was giving TMS options but reportedly her insurance dropped him.  Provider educated patient that the social worker will be arranging for some IOP or DBT and given her resources for TMS clinics in the community.  She denies suicidal/homicidal ideation/plan.  She denies auditory/visual hallucinations.  She has chronic depression and provider educated of the SSRIs taking few weeks to show optimal response but given her history of being tried on multiple SSRIs/SNRIs with poor response the recommendation is to have intensive outpatient psychotherapy along with medication management which has a good evidence of showing proper response to depression. Sleep: Poor  Appetite:  Fair  Past Psychiatric History: see h&P Family History:  Family History  Problem Relation Age of Onset   Depression Mother    Pneumonia Mother    Social History:  Social History   Substance and Sexual Activity  Alcohol Use Yes   Alcohol/week: 10.0 standard drinks of alcohol   Types: 10 Cans of beer per week   Comment: daily, last drank last night     Social  History   Substance and Sexual Activity  Drug Use Yes   Types: Marijuana   Comment: daily    Social History   Socioeconomic History   Marital status: Married    Spouse name: Not on file   Number of children: Not on file   Years of education: Not on file   Highest education level: Not on file  Occupational History   Occupation: Disabled  Tobacco Use   Smoking status: Never    Passive exposure: Current   Smokeless tobacco: Never  Vaping Use   Vaping status: Never Used  Substance and Sexual Activity   Alcohol use: Yes    Alcohol/week: 10.0 standard drinks of alcohol    Types: 10 Cans of beer per week    Comment: daily, last drank last night   Drug use: Yes    Types: Marijuana    Comment: daily   Sexual activity: Not Currently    Birth control/protection: Post-menopausal  Other Topics Concern   Not on file  Social History Narrative   Pt lives in [place] with husband.  She is on disability.  Pt stated that she receives outpatient psychiatry services through [facility].  She does not receive outpatient therapy.   Social Drivers of Corporate investment banker Strain: Low Risk  (10/23/2022)   Overall Financial Resource Strain (CARDIA)    Difficulty of Paying Living Expenses: Not hard at all  Food Insecurity: No Food Insecurity (10/22/2023)   Hunger Vital Sign    Worried About Running Out of Food in the  Last Year: Never true    Ran Out of Food in the Last Year: Never true  Transportation Needs: No Transportation Needs (10/22/2023)   PRAPARE - Administrator, Civil Service (Medical): No    Lack of Transportation (Non-Medical): No  Physical Activity: Insufficiently Active (10/23/2022)   Exercise Vital Sign    Days of Exercise per Week: 2 days    Minutes of Exercise per Session: 10 min  Stress: No Stress Concern Present (10/23/2022)   [employer] of Occupational Health - Occupational Stress Questionnaire    Feeling of Stress : Not at all  Social  Connections: Moderately Isolated (10/23/2022)   Social Connection and Isolation Panel    Frequency of Communication with Friends and Family: More than three times a week    Frequency of Social Gatherings with Friends and Family: More than three times a week    Attends Religious Services: Never    Database administrator or Organizations: No    Attends Engineer, structural: Never    Marital Status: Married   Past Medical History:  Past Medical History:  Diagnosis Date   Anxiety    Bipolar 1 disorder (HCC)    Depression    OCD (obsessive compulsive disorder)    PTSD (post-traumatic stress disorder)    S/P ECT (electroconvulsive therapy)     Past Surgical History:  Procedure Laterality Date   BIOPSY  02/03/2020   Procedure: BIOPSY;  Surgeon: Urban Garden, MD;  [facility];  Service: Gastroenterology;;   COLONOSCOPY WITH PROPOFOL  N/A 02/03/2020   [name]: 3mm polyp in sigmoid colon  as well as hemorrhoids, histology consistent with tubular adenoma   ESOPHAGOGASTRODUODENOSCOPY (EGD) WITH PROPOFOL  N/A 02/03/2020   castaneda: small gastric polyps, sessile, and fundic gland, small bowel bx normal   INDUCED ABORTION N/A 1980   POLYPECTOMY  02/03/2020   Procedure: POLYPECTOMY;  Surgeon: Umberto Ganong, Bearl Limes, MD;  Location: [facility];  Service: Gastroenterology;;   VEIN SURGERY      Current Medications: Current Facility-Administered Medications  Medication Dose Route Frequency Provider Last Rate Last Admin   alum & mag hydroxide-simeth (MAALOX/MYLANTA) 200-200-20 MG/5ML suspension 30 mL  30 mL Oral Q4H PRN McLauchlin, Angela, NP       antiseptic oral rinse (BIOTENE) solution 15 mL  15 mL Mouth Rinse PRN Aurelia Blotter, MD   15 mL at 10/25/23 1542   busPIRone  (BUSPAR ) tablet 15 mg  15 mg Oral TID Zahara Rembert, MD   15 mg at 10/31/23 2109   feeding supplement (ENSURE PLUS HIGH PROTEIN) liquid 237 mL  237 mL Oral BID BM Lisaanne Lawrie, MD   237  mL at 10/31/23 1403   FLUoxetine  (PROZAC ) capsule 20 mg  20 mg Oral Daily Eliska Hamil, MD   20 mg at 10/31/23 1610   gabapentin  (NEURONTIN ) capsule 300 mg  300 mg Oral TID Shrivastava, Aryendra, MD   300 mg at 10/31/23 2108   hydrOXYzine  (ATARAX ) tablet 50 mg  50 mg Oral Q6H PRN Quetzal Meany, MD   50 mg at 10/31/23 1232   lamoTRIgine  (LAMICTAL ) tablet 25 mg  25 mg Oral BID Elzada Pytel, MD   25 mg at 10/31/23 2108   levothyroxine  (SYNTHROID ) tablet 25 mcg  25 mcg Oral Q0600 Katelin Kutsch, MD   25 mcg at 10/31/23 0603   magnesium  hydroxide (MILK OF MAGNESIA) suspension 30 mL  30 mL Oral Daily PRN McLauchlin, Shelvy Dickens, NP   30 mL at 10/27/23  1214   midodrine (PROAMATINE) tablet 10 mg  10 mg Oral TID PRN Shrivastava, Aryendra, MD       multivitamin with minerals tablet 1 tablet  1 tablet Oral Daily Nichele Slawson, MD   1 tablet at 10/31/23 0901   OLANZapine  zydis (ZYPREXA ) disintegrating tablet 5 mg  5 mg Oral TID PRN Samirah Scarpati, MD   5 mg at 10/30/23 2134   omega-3 acid ethyl esters (LOVAZA) capsule 1 g  1 g Oral BID Shrivastava, Aryendra, MD   1 g at 10/31/23 0908   pantoprazole  (PROTONIX ) EC tablet 40 mg  40 mg Oral Daily Minal Stuller, MD   40 mg at 10/31/23 0900   pilocarpine (SALAGEN) tablet 5 mg  5 mg Oral QHS Ota Ebersole, MD   5 mg at 10/31/23 2109   rosuvastatin  (CRESTOR ) tablet 20 mg  20 mg Oral Daily Keylie Beavers, MD   20 mg at 10/31/23 0900    Lab Results: No results found for this or any previous visit (from the past 48 hours).  Blood Alcohol level:  Lab Results  Component Value Date   [facility] <15 10/21/2023   ETH <15 10/14/2023    Metabolic Disorder Labs: Lab Results  Component Value Date   HGBA1C 5.7 (H) 11/27/2022   No results found for: PROLACTIN No results found for: CHOL, TRIG, HDL, CHOLHDL, VLDL, LDLCALC  Physical Findings: AIMS:  , ,  ,  ,    CIWA:  CIWA-Ar Total: 2 COWS:      Psychiatric Specialty Exam:  Presentation   General Appearance:  Casual  Eye Contact: Fleeting  Speech: Clear and Coherent  Speech Volume: Normal    Mood and Affect  Mood: Anxious  Affect: Appropriate   Thought Process  Thought Processes: Coherent  Descriptions of Associations:Intact  Orientation:Full (Time, Place and Person)  Thought Content:Illogical  Hallucinations:Hallucinations: None   Ideas of Reference:None  Suicidal Thoughts:denies Homicidal Thoughts:Homicidal Thoughts: No    Sensorium  Memory: Immediate Fair; Recent Fair; Remote Fair  Judgment: Impaired  Insight: Shallow   Executive Functions  Concentration: Fair  Attention Span: Fair  Recall: [employer] of Knowledge: Fair  Language: Fair   Psychomotor Activity  Psychomotor Activity: Psychomotor Activity: Normal   Musculoskeletal: Strength & Muscle Tone: within normal limits Gait & Station: normal Assets  Assets: Manufacturing systems engineer; Desire for Improvement; Physical Health; Resilience    Physical Exam: Physical Exam Physical Exam Constitutional:      Appearance: Normal appearance.  HENT:     Head: Normocephalic and atraumatic.     Right Ear: External ear normal.     Left Ear: External ear normal.     Nose: Nose normal.     Mouth/Throat:     Mouth: Mucous membranes are moist.  Eyes:     Extraocular Movements: Extraocular movements intact.     Pupils: Pupils are equal, round, and reactive to light.  Cardiovascular:     Rate and Rhythm: Normal rate.  Pulmonary:     Effort: Pulmonary effort is normal.     Breath sounds: Normal breath sounds.  Abdominal:     Palpations: Abdomen is soft.  Musculoskeletal:        General: Normal range of motion.     Cervical back: Normal range of motion and neck supple.  Skin:    General: Skin is warm.  Neurological:     General: No focal deficit present.     Mental Status:  alert.    ROS Review  of Systems  All other systems reviewed and are  negative.  Blood pressure (!) 156/77, pulse 70, temperature 98.4 F (36.9 C), temperature source Oral, resp. rate 17, height 5' 6 (1.676 m), weight 79.8 kg, SpO2 100%. Body mass index is 28.41 kg/m.  Diagnosis: Principal Problem:   Bipolar affective disorder (HCC) Generalized anxiety disorder with panic attacks Borderline personality disorder PTSD, chronic Cannabis use disorder, moderate Alcohol use disorder, moderate, currently in withdrawal   Clinical Decision Making: Patient with history of borderline personality, depression and anxiety, recent increasing use of alcohol and history of cannabis use presented to emergency room with worsening anxiety, mood lability and bizarre behavior.  Patient is currently admitted to geropsych unit.  Patient is endorsing passive suicidal ideation with no intent or plan.  Patient is noted to be withdrawing from alcohol and displays severe anxiety.  She needs ongoing inpatient hospitalization.   Treatment Plan Summary:   Safety and Monitoring:             -- Voluntary admission to inpatient psychiatric unit for safety, stabilization and treatment             -- Daily contact with patient to assess and evaluate symptoms and progress in treatment             -- Patient's case to be discussed in multi-disciplinary team meeting             -- Observation Level: q15 minute checks             -- Vital signs:  q12 hours             -- Precautions: suicide, elopement, and assault   2. Psychiatric Diagnoses and Treatment:               Discontinue Cymbalta  on 10/26/2023. Prozac  increased to 20 mg daily Increased Lamictal  25 mg BID Continue omega-3 fatty acids Would recommend DBT therapy and outpatient basis.     Continue gabapentin  to 300 mg 3 times daily to help with anxiety/mood  Patient's husband reports Klonopin  has been discontinued by the previous provider few months ago. Continue BuSpar  15 mg 3 times daily   -- The  risks/benefits/side-effects/alternatives to this medication were discussed in detail with the patient and time was given for questions. The patient consents to medication trial.                -- Metabolic profile and EKG monitoring obtained while on an atypical antipsychotic (BMI: Lipid Panel: HbgA1c: QTc:)              -- Encouraged patient to participate in unit milieu and in scheduled group therapies                            3. Medical Issues Being Addressed:  CIWA protocol to monitor alcohol withdrawal with librium  taper  Significant dry mouth- xerostomia started patient on pilocarpine 5 mg b.I.d-will reduce the dose as patient is reporting excessive sedation/sleepiness.  4. Discharge Planning:   -- Social work and case management to assist with discharge planning and identification of hospital follow-up needs prior to discharge  -- Estimated LOS: 3-4 days  Jessee Mezera, MD 10/31/2023, 9:43 PM

## 2023-10-31 NOTE — Group Note (Signed)
 Date:  10/31/2023 Time:  9:23 PM  Group Topic/Focus:  Wrap-Up Group:   The focus of this group is to help patients review their daily goal of treatment and discuss progress on daily workbooks.    Participation Level:  Active  Participation Quality:  Appropriate  Affect:  Appropriate  Cognitive:  Appropriate  Insight: Good  Engagement in Group:  Engaged  Modes of Intervention:  Discussion  Bradley Caffey 10/31/2023, 9:23 PM

## 2023-10-31 NOTE — Group Note (Signed)
 Date:  10/31/2023 Time:  10:56 AM  Group Topic/Focus:  Fresh air Therapy Outdoors,    Participation Level:  Did Not Attend    Dawn Kelley 10/31/2023, 10:56 AM

## 2023-11-01 DIAGNOSIS — F314 Bipolar disorder, current episode depressed, severe, without psychotic features: Secondary | ICD-10-CM | POA: Diagnosis not present

## 2023-11-01 NOTE — Plan of Care (Signed)

## 2023-11-01 NOTE — Progress Notes (Signed)
  [facility] Adult Case Management Discharge Plan :  Will you be returning to the same living situation after discharge:  Yes,  pt will return home  At discharge, do you have transportation home?: Yes,  pt's husband will pick her up  Do you have the ability to pay for your medications: Yes,  [employer] MEDICARE / [place] DUAL COMPLETE  Release of information consent forms completed and in the chart;  Patient's signature needed at discharge.  Patient to Follow up at:  Follow-up Information     [facility] Go to.   Why: A referral has been sent on your behalf to the [facility]. They offer TMS but have paused services at this time but will bringing them back shortly. Contact information: [address] [place] [address] [phone]         [facility] Follow up.   Specialty: Behavioral Health Why: [facility] has a Dual Diagnosis Program (Substance Use and Mental Health). Call [phone] to schedule a no-cost and confidential outpatient mental health assessment. You can also get started by completing our online intake form. Contact information: [address] [place]  [address] [phone]        [facility] Follow up.   Why: A referral has been sent on your behalf to [facility] for a Partial Hospitalization Program. They feel you are more appropriate for a Substance Use Intensive Outpatient Program (SAIOP) due to previous benzodiazepene use, and current marijuana and alcohol use. For more information, call Melynda Stagger, LCSW at [phone]. We work directly with employers and families to ensure you receive the care you need. Contact information: [address] [place] [address] [phone]                 Next level of care provider has access to [facility] Link:no  Safety Planning and  Suicide Prevention discussed: Yes,  CSW went over SPE with pt      Has patient been referred to the Quitline?: Patient does not use tobacco/nicotine products  Patient has been referred for addiction treatment: Patient refused referral for treatment.  [address] LCSWA 11/01/2023, 11:20 AM

## 2023-11-01 NOTE — Group Note (Signed)
 [facility] LCSW Group Therapy Note   Group Date: 10/31/2023 Start Time: 1300 End Time: 1330   Type of Therapy/Topic:  Group Therapy:  Emotion Regulation  Participation Level:  Minimal   Mood:  Description of Group:    The purpose of this group is to assist patients in learning to regulate negative emotions and experience positive emotions. Patients will be guided to discuss ways in which they have been vulnerable to their negative emotions. These vulnerabilities will be juxtaposed with experiences of positive emotions or situations, and patients challenged to use positive emotions to combat negative ones. Special emphasis will be placed on coping with negative emotions in conflict situations, and patients will process healthy conflict resolution skills.  Therapeutic Goals: Patient will identify two positive emotions or experiences to reflect on in order to balance out negative emotions:  Patient will label two or more emotions that they find the most difficult to experience:  Patient will be able to demonstrate positive conflict resolution skills through discussion or role plays:   Summary of Patient Progress:   Pt tearful throughout group, reports she feels she has no way to regulate her emotions    Therapeutic Modalities:   Cognitive Behavioral Therapy Feelings Identification Dialectical Behavioral Therapy   Claudio Culver, LCSWA

## 2023-11-01 NOTE — Discharge Summary (Signed)
 Physician Discharge Summary Note  Patient:  Dawn Kelley is an 63 y.o., female MRN:  [id number] DOB:  02-Nov-1960 Patient phone:  [phone] (home)  Patient address:   [address] [place] [address],    Date of Admission:  10/22/2023 Date of Discharge: 11/01/23  Reason for Admission:  patient Is a 63 year old female, with borderline personality disorder, benzodiazepine dependence and bipolar disorder per chart who was brought in by EMS after she was asked to be removed from the building she was in due to bizarre behavior. Patient is noted to be extremely anxious, making passive suicidal statements of not wanting to wake up after sleep. Husband reports overwhelmed with patient's anxiety. Patient was admitted to geropsych unit for further stabilization.   Principal Problem: Bipolar affective disorder [facility]) Discharge Diagnoses: Principal Problem:   Bipolar affective disorder (HCC) Borderline personality disorder Generalized anxiety disorder Chronic benzodiazepine dependence  Past Psychiatric History: see h&p  Family Psychiatric  History: see h&p Social History:  Social History   Substance and Sexual Activity  Alcohol Use Yes   Alcohol/week: 10.0 standard drinks of alcohol   Types: 10 Cans of beer per week   Comment: daily, last drank last night     Social History   Substance and Sexual Activity  Drug Use Yes   Types: Marijuana   Comment: daily    Social History   Socioeconomic History   Marital status: Married    Spouse name: Not on file   Number of children: Not on file   Years of education: Not on file   Highest education level: Not on file  Occupational History   Occupation: Disabled  Tobacco Use   Smoking status: Never    Passive exposure: Current   Smokeless tobacco: Never  Vaping Use   Vaping status: Never Used  Substance and Sexual Activity   Alcohol use: Yes    Alcohol/week: 10.0 standard drinks of alcohol    Types: 10 Cans of beer per week     Comment: daily, last drank last night   Drug use: Yes    Types: Marijuana    Comment: daily   Sexual activity: Not Currently    Birth control/protection: Post-menopausal  Other Topics Concern   Not on file  Social History Narrative   Pt lives in [place] with husband.  She is on disability.  Pt stated that she receives outpatient psychiatry services through [facility].  She does not receive outpatient therapy.   Social Drivers of Corporate investment banker Strain: Low Risk  (10/23/2022)   Overall Financial Resource Strain (CARDIA)    Difficulty of Paying Living Expenses: Not hard at all  Food Insecurity: No Food Insecurity (10/22/2023)   Hunger Vital Sign    Worried About Running Out of Food in the Last Year: Never true    Ran Out of Food in the Last Year: Never true  Transportation Needs: No Transportation Needs (10/22/2023)   PRAPARE - Administrator, Civil Service (Medical): No    Lack of Transportation (Non-Medical): No  Physical Activity: Insufficiently Active (10/23/2022)   Exercise Vital Sign    Days of Exercise per Week: 2 days    Minutes of Exercise per Session: 10 min  Stress: No Stress Concern Present (10/23/2022)   [employer] of Occupational Health - Occupational Stress Questionnaire    Feeling of Stress : Not at all  Social Connections: Moderately Isolated (10/23/2022)   Social Connection and Isolation Panel    Frequency  of Communication with Friends and Family: More than three times a week    Frequency of Social Gatherings with Friends and Family: More than three times a week    Attends Religious Services: Never    Database administrator or Organizations: No    Attends Engineer, structural: Never    Marital Status: Married   Past Medical History:  Past Medical History:  Diagnosis Date   Anxiety    Bipolar 1 disorder (HCC)    Depression    OCD (obsessive compulsive disorder)    PTSD (post-traumatic stress disorder)    S/P ECT  (electroconvulsive therapy)     Past Surgical History:  Procedure Laterality Date   BIOPSY  02/03/2020   Procedure: BIOPSY;  Surgeon: Eartha Angelia Sieving, MD;  [facility];  Service: Gastroenterology;;   COLONOSCOPY WITH PROPOFOL  N/A 02/03/2020   Eartha: 3mm polyp in sigmoid colon  as well as hemorrhoids, histology consistent with tubular adenoma   ESOPHAGOGASTRODUODENOSCOPY (EGD) WITH PROPOFOL  N/A 02/03/2020   castaneda: small gastric polyps, sessile, and fundic gland, small bowel bx normal   INDUCED ABORTION N/A 1980   POLYPECTOMY  02/03/2020   Procedure: POLYPECTOMY;  Surgeon: Eartha Angelia Sieving, MD;  Location: [facility];  Service: Gastroenterology;;   VEIN SURGERY     Family History:  Family History  Problem Relation Age of Onset   Depression Mother    Pneumonia Mother     Hospital Course:  patient Is a 63 year old female, with borderline personality disorder, benzodiazepine dependence and bipolar disorder per chart who was brought in by EMS after she was asked to be removed from the building she was in due to bizarre behavior. Patient is noted to be extremely anxious, making passive suicidal statements of not wanting to wake up after sleep. Husband reports overwhelmed with patient's anxiety. Patient was admitted to geropsych unit for further stabilization.  Patient has history of being on chronic benzodiazepines for years and in the last few months patient has been titrated down and benzodiazepines have been discontinued for 3 months.  Patient started self-medicating with alcohol.  On admission patient was started on CIWA protocol and was put on Librium  taper for 5 days adding gabapentin  300 mg 3 times daily to help with anxiety, chronic alcoholism.  Patient Cymbalta  was adjusted to 60 mg twice daily for depression but as she reported poor response to it Cymbalta  was switched to Prozac  titrated up to 20 mg daily.  Throughout the hospitalization patient is  noted to be spitting amongst the staff members.  Patient is noted to be cheerful and engaging with the peers but when she sees the provider she starts shaking and crying saying she is depressed.  Provider educated patient extensively on working on coping skills and the need for intensive therapy to work on the coping skills but she insists that the medication is not helping immediately and she wants a quick fix to her depression.  When provider discussed the ECT and TMS, she insist that she does not want ECT and then reports for TMS her insurance dropped the provider.  Social work team has worked extensively in finding her resources and provided a list of [employer] services in her area of residence.  Provider also educated to reach out to [employer] and [employer] for future reference.  Patient maintained safe behaviors throughout her admission onto the unit and is not noted to be responding to any internal stimuli.  Patient consistently  denied SI/HI/intent/plan for many days and participated in groups.  She tolerated the medication well with no problems.   Detailed risk assessment is complete based on clinical exam and individual risk factors and acute suicide risk is low and acute violence risk is low.     Currently, all modifiable risk of harm to self/harm to others have been addressed and patient is no longer appropriate for the acute inpatient setting and is able to continue treatment for mental health needs in the community with the supports as indicated below.  Patient is educated and verbalized understanding of discharge plan of care including medications, follow-up appointments, mental health resources and further crisis services in the community.  Patient is instructed to call 911 or present to the nearest emergency room should she experience any decompensation in mood, disturbance of bowel or return of suicidal/homicidal ideations.  Patient verbalizes understanding of this education and agrees to  this plan of care  Physical Findings: AIMS:  , ,  ,  ,    CIWA:  CIWA-Ar Total: 2 COWS:        Psychiatric Specialty Exam:  Presentation  General Appearance:  Appropriate for Environment; Casual  Eye Contact: Fair  Speech: Clear and Coherent  Speech Volume: Normal    Mood and Affect  Mood: Anxious  Affect: Appropriate   Thought Process  Thought Processes: Coherent  Descriptions of Associations:Intact  Orientation:Full (Time, Place and Person)  Thought Content:Abstract Reasoning  Hallucinations:Hallucinations: None  Ideas of Reference:None  Suicidal Thoughts:Suicidal Thoughts: No  Homicidal Thoughts:Homicidal Thoughts: No   Sensorium  Memory: Immediate Fair; Recent Fair; Remote Fair  Judgment: Fair  Insight: Fair   Art therapist  Concentration: Fair  Attention Span: Fair  Recall: [employer] of Knowledge: Fair  Language: Fair   Psychomotor Activity  Psychomotor Activity: Psychomotor Activity: Normal  Musculoskeletal: Strength & Muscle Tone: within normal limits Gait & Station: normal Assets  Assets: Manufacturing systems engineer; Desire for Improvement; Resilience; Social Support   Sleep  Sleep: Sleep: Fair    Physical Exam: Physical Exam ROS Blood pressure (!) 128/56, pulse (!) 59, temperature 98.5 F (36.9 C), resp. rate 18, height 5' 6 (1.676 m), weight 79.8 kg, SpO2 100%. Body mass index is 28.41 kg/m.   Social History   Tobacco Use  Smoking Status Never   Passive exposure: Current  Smokeless Tobacco Never   Tobacco Cessation:  N/A, patient does not currently use tobacco products   Blood Alcohol level:  Lab Results  Component Value Date   [facility] <15 10/21/2023   ETH <15 10/14/2023    Metabolic Disorder Labs:  Lab Results  Component Value Date   HGBA1C 5.7 (H) 11/27/2022   No results found for: PROLACTIN No results found for: CHOL, TRIG, HDL, CHOLHDL, VLDL, LDLCALC  See  Psychiatric Specialty Exam and Suicide Risk Assessment completed by Attending Physician prior to discharge.  Discharge destination:  Home  Is patient on multiple antipsychotic therapies at discharge:  No   Has Patient had three or more failed trials of antipsychotic monotherapy by history:  No  Recommended Plan for Multiple Antipsychotic Therapies: NA   Allergies as of 11/01/2023       Reactions   Escitalopram Nausea And Vomiting   Tolerated citalopram with out difficulty for many years.   Seroquel  [quetiapine  Fumarate] Shortness Of Breath   Trazodone Hives        Medication List     STOP taking these medications    clonazePAM  0.5 MG tablet  Commonly known as: KLONOPIN    DULoxetine  60 MG capsule Commonly known as: CYMBALTA    ibuprofen 600 MG tablet Commonly known as: ADVIL   LORazepam  1 MG tablet Commonly known as: Ativan        TAKE these medications      Indication  antiseptic oral rinse Liqd 15 mLs by Mouth Rinse route as needed for dry mouth.    busPIRone  15 MG tablet Commonly known as: BUSPAR  Take 15 mg by mouth 3 (three) times daily.    FLUoxetine  20 MG capsule Commonly known as: PROZAC  Take 1 capsule (20 mg total) by mouth daily.    gabapentin  300 MG capsule Commonly known as: NEURONTIN  Take 1 capsule (300 mg total) by mouth 3 (three) times daily.    lamoTRIgine  25 MG tablet Commonly known as: LAMICTAL  Take 1 tablet (25 mg total) by mouth 2 (two) times daily.    levothyroxine  25 MCG tablet Commonly known as: SYNTHROID  Take 1 tablet (25 mcg total) by mouth daily at 6 (six) AM. What changed: when to take this    losartan  25 MG tablet Commonly known as: COZAAR  Take 25 mg by mouth daily.    multivitamin with minerals Tabs tablet Take 1 tablet by mouth daily.    omega-3 acid ethyl esters 1 g capsule Commonly known as: LOVAZA  Take 1 capsule (1 g total) by mouth 2 (two) times daily.    pantoprazole  40 MG tablet Commonly known as:  PROTONIX  Take 40 mg by mouth daily.    propranolol  20 MG tablet Commonly known as: INDERAL  Take 20 mg by mouth 3 (three) times daily. Notes to patient: Not given during admission    rosuvastatin  20 MG tablet Commonly known as: CRESTOR  Take 20 mg by mouth daily.         Follow-up Information     [facility] Go to.   Why: A referral has been sent on your behalf to the [facility]. They offer TMS but have paused services at this time but will bringing them back shortly. Contact information: [address] [place] [address] [phone]         [facility] Follow up.   Specialty: Behavioral Health Why: [facility] has a Dual Diagnosis Program (Substance Use and Mental Health). Call [phone] to schedule a no-cost and confidential outpatient mental health assessment. You can also get started by completing our online intake form. Contact information: [address] [place]  [address] [phone]        [facility] Follow up.   Why: A referral has been sent on your behalf to [facility] for a Partial Hospitalization Program. They feel you are more appropriate for a Substance Use Intensive Outpatient Program (SAIOP) due to previous benzodiazepene use, and current marijuana and alcohol use. For more information, call Zell Maier, LCSW at [phone]. We work directly with employers and families to ensure you receive the care you need. Contact information: [address] [place] [address] [phone]                 Follow-up recommendations:  Activity:  As tolerated    Signed: Caylon Saine, MD 11/01/2023, 12:36 PM

## 2023-11-01 NOTE — Progress Notes (Signed)
 Patient ID: Dawn Kelley, female   DOB: [birth year], 63 y.o.   MRN: [id number]  Discharge note: Patient refused to fill out suicide safety plan and survey. RN met with pt and reviewed pt's discharge instructions. Pt verbalized understanding of discharge instructions and pt did not have any questions. RN reviewed and provided pt with a copy of SRA, AVS and Transition Record. RN returned pt's belongings to pt. Prescriptions discussed with patient. Pt denied SI/HI/AVH. Pt was appreciative of the care pt received at [facility]. Patient discharged to the lobby with husband here to pick up patient.

## 2023-11-01 NOTE — Group Note (Signed)
 Date:  11/01/2023 Time:  11:01 AM  Group Topic/Focus:  Fresh air Therapy outdoors with music.    Participation Level:  Did Not Attend   Merton Abts 11/01/2023, 11:01 AM

## 2023-11-01 NOTE — BHH Counselor (Signed)
 CSW sent referral for pt to [facility].   They report they would like it to be very clear that they will NOT put pt back on benzos if pt requests.   CSW informed pt. Pt agreeable.   Derrill Flirt, MSW, [place] 11/01/2023 11:09 AM

## 2023-11-01 NOTE — BHH Counselor (Signed)
 CSW contacted [facility] and referred pt to the PHP/ IOP program.   They report provider has declined pt because of Benzo use as well as marijuana and alcohol use.   Clinicians feel pt would be more appropriate for a dual-diagnosis program.   Derrill Flirt, MSW, [facility] 11/01/2023 11:08 AM

## 2023-11-01 NOTE — Plan of Care (Signed)
?  Problem: Education: ?Goal: Ability to state activities that reduce stress will improve ?Outcome: Adequate for Discharge ?  ?Problem: Coping: ?Goal: Ability to identify and develop effective coping behavior will improve ?Outcome: Adequate for Discharge ?  ?Problem: Self-Concept: ?Goal: Ability to identify factors that promote anxiety will improve ?Outcome: Adequate for Discharge ?Goal: Level of anxiety will decrease ?Outcome: Adequate for Discharge ?Goal: Ability to modify response to factors that promote anxiety will improve ?Outcome: Adequate for Discharge ?  ?

## 2023-11-13 DIAGNOSIS — I10 Essential (primary) hypertension: Secondary | ICD-10-CM | POA: Diagnosis not present

## 2023-11-13 DIAGNOSIS — E7849 Other hyperlipidemia: Secondary | ICD-10-CM | POA: Diagnosis not present

## 2023-11-13 DIAGNOSIS — E038 Other specified hypothyroidism: Secondary | ICD-10-CM | POA: Diagnosis not present

## 2023-11-13 DIAGNOSIS — I700 Atherosclerosis of aorta: Secondary | ICD-10-CM | POA: Diagnosis not present

## 2023-11-21 ENCOUNTER — Other Ambulatory Visit: Payer: Self-pay

## 2023-11-21 ENCOUNTER — Encounter (HOSPITAL_COMMUNITY): Payer: Self-pay

## 2023-11-21 ENCOUNTER — Emergency Department (HOSPITAL_COMMUNITY)
Admission: EM | Admit: 2023-11-21 | Discharge: 2023-11-21 | Disposition: A | Discharge status: Home / Self Care | Attending: Emergency Medicine | Admitting: Emergency Medicine

## 2023-11-21 DIAGNOSIS — I10 Essential (primary) hypertension: Secondary | ICD-10-CM | POA: Insufficient documentation

## 2023-11-21 DIAGNOSIS — F418 Other specified anxiety disorders: Secondary | ICD-10-CM | POA: Insufficient documentation

## 2023-11-21 DIAGNOSIS — F419 Anxiety disorder, unspecified: Secondary | ICD-10-CM | POA: Diagnosis present

## 2023-11-21 DIAGNOSIS — Z79899 Other long term (current) drug therapy: Secondary | ICD-10-CM | POA: Diagnosis not present

## 2023-11-21 LAB — CBC WITH DIFFERENTIAL/PLATELET
Abs Immature Granulocytes: 0.03 K/uL (ref 0.00–0.07)
Basophils Absolute: 0.1 K/uL (ref 0.0–0.1)
Basophils Relative: 1 %
Eosinophils Absolute: 0 K/uL (ref 0.0–0.5)
Eosinophils Relative: 0 %
HCT: 45.1 % (ref 36.0–46.0)
Hemoglobin: 14.4 g/dL (ref 12.0–15.0)
Immature Granulocytes: 0 %
Lymphocytes Relative: 20 %
Lymphs Abs: 2.2 K/uL (ref 0.7–4.0)
MCH: 28.5 pg (ref 26.0–34.0)
MCHC: 31.9 g/dL (ref 30.0–36.0)
MCV: 89.1 fL (ref 80.0–100.0)
Monocytes Absolute: 0.4 K/uL (ref 0.1–1.0)
Monocytes Relative: 4 %
Neutro Abs: 8.1 K/uL — ABNORMAL HIGH (ref 1.7–7.7)
Neutrophils Relative %: 75 %
Platelets: UNDETERMINED K/uL (ref 150–400)
RBC: 5.06 MIL/uL (ref 3.87–5.11)
RDW: 14.9 % (ref 11.5–15.5)
WBC: 10.9 K/uL — ABNORMAL HIGH (ref 4.0–10.5)
nRBC: 0 % (ref 0.0–0.2)

## 2023-11-21 LAB — COMPREHENSIVE METABOLIC PANEL WITH GFR
ALT: 19 U/L (ref 0–44)
AST: 21 U/L (ref 15–41)
Albumin: 4.4 g/dL (ref 3.5–5.0)
Alkaline Phosphatase: 96 U/L (ref 38–126)
Anion gap: 16 — ABNORMAL HIGH (ref 5–15)
BUN: 11 mg/dL (ref 8–23)
CO2: 18 mmol/L — ABNORMAL LOW (ref 22–32)
Calcium: 9.3 mg/dL (ref 8.9–10.3)
Chloride: 106 mmol/L (ref 98–111)
Creatinine, Ser: 0.51 mg/dL (ref 0.44–1.00)
GFR, Estimated: >60 mL/min (ref >60)
Glucose, Bld: 126 mg/dL — ABNORMAL HIGH (ref 70–99)
Potassium: 3.4 mmol/L — ABNORMAL LOW (ref 3.5–5.1)
Sodium: 140 mmol/L (ref 135–145)
Total Bilirubin: 0.4 mg/dL (ref 0.0–1.2)
Total Protein: 8.2 g/dL — ABNORMAL HIGH (ref 6.5–8.1)

## 2023-11-21 LAB — RAPID URINE DRUG SCREEN, HOSP PERFORMED
Amphetamines: NOT DETECTED
Barbiturates: NOT DETECTED
Benzodiazepines: POSITIVE — AB
Cocaine: NOT DETECTED
Opiates: NOT DETECTED
Tetrahydrocannabinol: POSITIVE — AB

## 2023-11-21 LAB — ETHANOL: Alcohol, Ethyl (B): <15 mg/dL (ref <15)

## 2023-11-21 MED ORDER — LORAZEPAM 2 MG/ML IJ SOLN
2.0000 mg | Freq: Once | INTRAMUSCULAR | Status: AC
  Administered 2023-11-21: 2 mg via INTRAMUSCULAR
  Filled 2023-11-21 (×2): qty 1

## 2023-11-21 MED ORDER — ZIPRASIDONE MESYLATE 20 MG IM SOLR
10.0000 mg | Freq: Once | INTRAMUSCULAR | Status: AC
  Administered 2023-11-21: 10 mg via INTRAMUSCULAR
  Filled 2023-11-21: qty 20

## 2023-11-21 MED ORDER — QUETIAPINE FUMARATE 50 MG PO TABS: 50.0000 mg | ORAL_TABLET | Freq: Three times a day (TID) | ORAL | 0 refills | Status: DC

## 2023-11-21 NOTE — ED Provider Notes (Signed)
 The patient has been cleared for discharge by psychiatry, vital signs normal, workup unremarkable, they have recommended increasing Seroquel  to 50 mg 3 times daily plus her bedtime dose as well.  She has been given local follow-up information for psychiatry here and locally in the region   Cleotilde Rogue, MD 11/21/23 2030

## 2023-11-21 NOTE — ED Notes (Signed)
 Pt husband here to visit at this time.

## 2023-11-21 NOTE — ED Provider Notes (Signed)
 [place] EMERGENCY DEPARTMENT AT [facility] Provider Note   CSN: [id number] Arrival date & time: 11/21/23  0740     Patient presents with: Anxiety   Dawn Kelley is a 63 y.o. female.   Patient has a history of hypertension and anxiety.  She presents with severe anxiety and depression.  The history is provided by the patient and medical records. No language interpreter was used.  Anxiety This is a chronic problem. The current episode started more than 2 days ago. The problem occurs constantly. The problem has been rapidly worsening. Pertinent negatives include no chest pain, no abdominal pain and no headaches. Nothing aggravates the symptoms. Nothing relieves the symptoms. She has tried nothing for the symptoms.       Prior to Admission medications   Medication Sig Start Date End Date Taking? Authorizing Provider  antiseptic oral rinse (BIOTENE) LIQD 15 mLs by Mouth Rinse route as needed for dry mouth. 10/31/23  Yes Jadapalle, Sree, MD  busPIRone  (BUSPAR ) 15 MG tablet Take 15 mg by mouth 3 (three) times daily. 08/16/23  Yes [provider]  FLUoxetine  (PROZAC ) 20 MG capsule Take 1 capsule (20 mg total) by mouth daily. 11/01/23  Yes Jadapalle, Sree, MD  gabapentin  (NEURONTIN ) 300 MG capsule Take 1 capsule (300 mg total) by mouth 3 (three) times daily. 11/01/23  Yes Jadapalle, Sree, MD  lamoTRIgine  (LAMICTAL ) 25 MG tablet Take 1 tablet (25 mg total) by mouth 2 (two) times daily. 11/01/23  Yes Jadapalle, Sree, MD  levothyroxine  (SYNTHROID ) 25 MCG tablet Take 1 tablet (25 mcg total) by mouth daily at 6 (six) AM. Patient taking differently: Take 25 mcg by mouth daily before breakfast. 11/01/23  Yes Jadapalle, Sree, MD  losartan  (COZAAR ) 25 MG tablet Take 25 mg by mouth daily.   Yes [provider]  Multiple Vitamin (MULTIVITAMIN WITH MINERALS) TABS tablet Take 1 tablet by mouth daily. 11/01/23  Yes Jadapalle, Sree, MD  omega-3 acid ethyl esters (LOVAZA ) 1 g  capsule Take 1 capsule (1 g total) by mouth 2 (two) times daily. 11/01/23  Yes Jadapalle, Sree, MD  pantoprazole  (PROTONIX ) 40 MG tablet Take 40 mg by mouth daily.   Yes [provider]  QUEtiapine  (SEROQUEL ) 100 MG tablet Take 100 mg by mouth at bedtime. 11/13/23  Yes [provider]  rosuvastatin  (CRESTOR ) 20 MG tablet Take 20 mg by mouth daily. 10/15/22  Yes [provider]  propranolol  (INDERAL ) 20 MG tablet Take 20 mg by mouth 3 (three) times daily. Patient not taking: Reported on 11/21/2023 03/29/20   [provider]    Allergies: Escitalopram, Seroquel  [quetiapine  fumarate], and Trazodone    Review of Systems  Constitutional:  Negative for appetite change and fatigue.  HENT:  Negative for congestion, ear discharge and sinus pressure.   Eyes:  Negative for discharge.  Respiratory:  Negative for cough.   Cardiovascular:  Negative for chest pain.  Gastrointestinal:  Negative for abdominal pain and diarrhea.  Genitourinary:  Negative for frequency and hematuria.  Musculoskeletal:  Negative for back pain.  Skin:  Negative for rash.  Neurological:  Negative for seizures and headaches.  Psychiatric/Behavioral:  Positive for agitation and dysphoric mood. Negative for hallucinations.     Updated Vital Signs BP 124/78 (BP Location: Left Arm)   Pulse 72   Temp 98.3 F (36.8 C) (Oral)   Resp 18   Ht 5' 6 (1.676 m)   Wt 79.4 kg   SpO2 99%   BMI 28.25 kg/m  Physical Exam Vitals and nursing note reviewed.  Constitutional:      Appearance: She is well-developed.  HENT:     Head: Normocephalic.     Nose: Nose normal.  Eyes:     General: No scleral icterus.    Conjunctiva/sclera: Conjunctivae normal.  Neck:     Thyroid : No thyromegaly.  Cardiovascular:     Rate and Rhythm: Normal rate and regular rhythm.     Heart sounds: No murmur heard.    No friction rub. No gallop.  Pulmonary:     Breath sounds: No stridor. No wheezing or rales.  Chest:      Chest wall: No tenderness.  Abdominal:     General: There is no distension.     Tenderness: There is no abdominal tenderness. There is no rebound.  Musculoskeletal:        General: Normal range of motion.     Cervical back: Neck supple.  Lymphadenopathy:     Cervical: No cervical adenopathy.  Skin:    Findings: No erythema or rash.  Neurological:     Mental Status: She is alert and oriented to person, place, and time.     Motor: No abnormal muscle tone.     Coordination: Coordination normal.  Psychiatric:     Comments: Anxious and depressed but not suicidal     (all labs ordered are listed, but only abnormal results are displayed) Labs Reviewed  CBC WITH DIFFERENTIAL/PLATELET - Abnormal; Notable for the following components:      Result Value   WBC 10.9 (*)    Neutro Abs 8.1 (*)    All other components within normal limits  COMPREHENSIVE METABOLIC PANEL WITH GFR - Abnormal; Notable for the following components:   Potassium 3.4 (*)    CO2 18 (*)    Glucose, Bld 126 (*)    Total Protein 8.2 (*)    Anion gap 16 (*)    All other components within normal limits  RAPID URINE DRUG SCREEN, HOSP PERFORMED - Abnormal; Notable for the following components:   Benzodiazepines POSITIVE (*)    Tetrahydrocannabinol POSITIVE (*)    All other components within normal limits  ETHANOL    EKG: None  Radiology: No results found.   Procedures   Medications Ordered in the ED  ziprasidone  (GEODON ) injection 10 mg (10 mg Intramuscular Given 11/21/23 0826)  LORazepam  (ATIVAN ) injection 2 mg (2 mg Intramuscular Given 11/21/23 1450)   Patient is medically cleared                                 Medical Decision Making Amount and/or Complexity of Data Reviewed Labs: ordered.  Risk Prescription drug management.  Patient with severe anxiety and depression, awaiting behavioral health consult     Final diagnoses:  None    ED Discharge Orders     None           Suzette Pac, MD 11/23/23 [id number]

## 2023-11-21 NOTE — ED Notes (Signed)
 Patient wanted to nurse to make a note that her PCP took her off of propanolol.

## 2023-11-21 NOTE — ED Notes (Signed)
 Pt is very anxious at this time. She has been dressed out into scrubs.Pt's belongings have been placed in locker #12. Pts home meds are with the nurse at this time.
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
## 2023-11-21 NOTE — Discharge Instructions (Addendum)
 You have been cleared to be discharged home to follow-up with a psychiatrist in the community.  Please see the phone numbers I have attached below as well as the formal office number for the outpatient behavioral health here in [place], call first thing in the morning to make an appointment.  You should be able to be seen within the next 2 weeks.  Return to the ER for severe worsening symptoms.  The psychiatry team recommended that you take quetiapine  50 mg in the morning, at lunch and in the evening and then take your normal bedtime dose as well.  Substance Abuse Treatment Programs  Intensive Outpatient Programs [facility]     [address]      [place], [place]                   [phone]       [facility] [address] [place] [address] [place], [place] [phone]  [facility]     (Inpatient and outpatient)     [address]           [phone]    [facility] [phone] (Suboxone and Methadone)  [address]      [place], [place] [address]      [phone]       [address] [place], [place] [phone]  [facility] (Outpatient/Inpatient, Chemical)    (insurance only) [phone]             [facility] (Groups & Residential) [place], [place] [phone]     [facility]     [address]     [place], [place]      [phone]       [facility] (for caregivers and family) [phone] [place], [place] [phone]      Residential Treatment Programs [facility]      [address] [place], [place] [address]  [phone]       [facility] [address] [place], [place] [address] [phone]  [facility] [place]        [phone]       [facility] [phone]  [facility] ([facility]             [address]                                         [place], [place]                                                 [phone] or [phone]                               [facility] [address] [place], [address] [phone]  [facility]    [address]      [place], [place]     [phone]       [facility] [address] [place], [place] [phone]  [facility]   [address] [place], [place] [address]     [phone]  Admissions: 8am-3pm M-F  [facility] ([facility]) [address] [place], [place] [phone]  [facility]: Residential Program ([address])   [place], [place]      [phone] or [phone]     [facility]: [facility] [place], [place] (Walk in Hours over the weekend or by referral)  [facility] [address] [place], [place], [place] [address] [phone]  Crisis Mobile: [facility]:  [phone] (for crisis response 24 hours a day) [facility] Hotline:      [phone] Outpatient Psychiatry and Counseling  [facility]: Mobile Crisis Management 24 hours:  [phone]  [facility] [employer] sliding scale fee and walk in schedule: M-F 8am-12pm/1pm-3pm [address]  [place], [place] [address] [phone]  [facility] [address] [place], [place] [address] [phone]  [facility] ([facility] [employer])- new patient walk-in appointments available Monday - Friday 8am -3pm.          [address] [place], [place] [address] [phone] or crisis line- [phone]  [facility] Out[facility] Services/ Intensive Outpatient Therapy Program [address] [place], [place] [address] [phone]  [facility]                  Crisis Services      [phone] [address]     [place], [place] [address]                 [facility]   [facility] [phone]. [address] [place], [place] [address]   [facility]          [address]  [place], [place] [address]       [phone]  [facility] [address] [place], [place] [address] [phone]  [facility]    [address]    [place], [place] [address]     [phone]       Ima Anon, MD     [address]     [place] [address]     [phone]       [facility] [address] [place] [address]  [facility]     [address]     [place], [place]      [phone]       [facility] Rocky Guayanilla, MD [address] [place], [place] [address] [phone]  [facility]     [address]     [place], [place] [address]     [phone]       [facility] [address] [place], [place] [address] [phone] Resources for Temporary Residential Assistance/Crisis Centers  [facility]) M-F 8am-3pm   [address]  [place], [place] [address]   [phone] Services include: laundry, barbering, support groups, case management, phone  & computer access, showers, AA/NA mtgs, mental health/substance abuse nurse, job skills class, disability information, VA assistance, spiritual classes, etc.   HOMELESS SHELTERS  [facility]     [facility]   [address] [place]     [phone]              [employer] (women and children)       [address] [place], [place]  [address] [phone] [email or web address] for application and process Application Required  [facility] [employer] [facility]   [address]    [place] [address]     [phone]                    [facility] [address] [place], [place] [address] [phone]  application appt.) Application Required  [facility] (women only)    [address]     [place], [place] [address]     [phone]      Intake starts 6pm daily Need valid ID, SSC, & Police report [employer] [address] [place], [place] [phone] Application Required  [employer] (men only)     [address]      [place], [place]     [phone]       [facility] [place] (Pregnant women only) [address] [place], [place] [phone]  [facility]      [address]      [place], [place] [address]     [phone]             [facility] [address] [place], [place] [phone] 90 day commitment/SA/Application process  [facility](men only)     [address]     [place], [place]     [phone]       Check-in at [facility] [address] [place], [place] [address] [phone] Men/Women/Women and Children must be there by 7 pm  [facility] [place], [place] [phone]

## 2023-11-21 NOTE — ED Notes (Signed)
 Patient having anxiety about being discharged.  MD made aware and will speak with patient

## 2023-11-21 NOTE — BH Assessment (Signed)
 This consult has been deferred to [facility] for assessment. I am starting a chat for this patient including nurses, doctors, IRIS and TTS, [facility] coordinator will update in EPIC secure chat when assessment time and provider are assigned.  Sela Falk T. Aura, MS, [facility], [facility] Triage Specialist [facility]

## 2023-11-21 NOTE — ED Notes (Signed)
Patient is currently sleeping.

## 2023-11-21 NOTE — ED Notes (Signed)
 Pt states she is spotting on her brief in the bathroom and has been for about 3-4 weeks. Also, she states she thinks she ruptured her left eardrum about 6 mo ago with a q-tip as she observed 2-3 drops of blood and states she has vertigo and is wanting a doctor around [place] but has an appt for a dr in [facility] tomorrow but will not be able to make it obviously because she is here in the ED. Instructed pt if she sees more blood to please let ED staff know and also told her the EDP would be informed.

## 2023-11-21 NOTE — ED Notes (Signed)
 Pt medication given to pharmacy, pt will need to receive her home medication back before being discharged

## 2023-11-21 NOTE — ED Notes (Signed)
 Patient provided with personal belongings.  All home medications returned to patient prior to d/c

## 2023-11-21 NOTE — BH Assessment (Signed)
 Advised pt's RN (Angie) of deferment to IRIS for assessment and made aware of the secure chat for communication purposes.   Emileo Semel T. Aura, MS, [facility], [facility] Triage Specialist [facility]

## 2023-11-21 NOTE — ED Notes (Signed)
 Received a call that pt should be ready for TTS/IRIS screening at 1700hrs.

## 2023-11-21 NOTE — ED Triage Notes (Signed)
 Patient BIB [facility] from home, for complaint of Psych for past 5 days, I'm afraid I'm going to be admitted somewhere and not get out. EMS Informed nurse patient admits to drinking 10 beers, smoking marijuana and not taking medications.. Patient told ED staff that she drank 5 beers last night and took medications, then stated she drank 3 beers. Patient also stated  Medication are not helping and doctors keeping changing meds.

## 2023-11-21 NOTE — ED Notes (Signed)
 Patient wanded by security.

## 2023-11-22 ENCOUNTER — Encounter (HOSPITAL_COMMUNITY): Payer: Self-pay | Admitting: Emergency Medicine

## 2023-11-22 ENCOUNTER — Emergency Department (HOSPITAL_COMMUNITY)
Admission: EM | Admit: 2023-11-22 | Discharge: 2023-11-22 | Disposition: A | Discharge status: Home / Self Care | Attending: Emergency Medicine | Admitting: Emergency Medicine

## 2023-11-22 DIAGNOSIS — R000 Tachycardia, unspecified: Secondary | ICD-10-CM | POA: Diagnosis not present

## 2023-11-22 DIAGNOSIS — F419 Anxiety disorder, unspecified: Secondary | ICD-10-CM | POA: Insufficient documentation

## 2023-11-22 DIAGNOSIS — I959 Hypotension, unspecified: Secondary | ICD-10-CM | POA: Diagnosis not present

## 2023-11-22 MED ORDER — LORAZEPAM 1 MG PO TABS
1.0000 mg | ORAL_TABLET | Freq: Once | ORAL | Status: AC
  Administered 2023-11-22: 1 mg via ORAL
  Filled 2023-11-22: qty 1

## 2023-11-22 MED ORDER — LORAZEPAM 2 MG/ML IJ SOLN
2.0000 mg | Freq: Once | INTRAMUSCULAR | Status: AC
  Administered 2023-11-22: 2 mg via INTRAMUSCULAR
  Filled 2023-11-22: qty 1

## 2023-11-22 NOTE — ED Notes (Signed)
 Pt continues to come out of room asking for her ativan . RN has educated pt and requested pt to please wait in her room for her medication and dc papers.

## 2023-11-22 NOTE — ED Provider Notes (Signed)
 [facility]-EMERGENCY DEPT [facility] Emergency Department Provider Note MRN:  [id number]  Arrival date & time: 11/22/23     Chief Complaint   Anxiety   History of Present Illness   Dawn Kelley is a 63 y.o. year-old female with a history of anxiety presenting to the ED with chief complaint of anxiety.  Continued anxiety worsening over the past 4 years.  Continues to worsen.  Cleared by psych yesterday.  Not having any suicidal thoughts.  Thinks that not having her Klonopin  is making it worse.  Review of Systems  A thorough review of systems was obtained and all systems are negative except as noted in the HPI and PMH.   Patient's Health History    Past Medical History:  Diagnosis Date   Anxiety    Bipolar 1 disorder (HCC)    Depression    OCD (obsessive compulsive disorder)    PTSD (post-traumatic stress disorder)    S/P ECT (electroconvulsive therapy)     Past Surgical History:  Procedure Laterality Date   BIOPSY  02/03/2020   Procedure: BIOPSY;  Surgeon: Eartha Angelia Sieving, MD;  Location: [facility] ENDO SUITE;  Service: Gastroenterology;;   COLONOSCOPY WITH PROPOFOL  N/A 02/03/2020   [name]: 3mm polyp in sigmoid colon  as well as hemorrhoids, histology consistent with tubular adenoma   ESOPHAGOGASTRODUODENOSCOPY (EGD) WITH PROPOFOL  N/A 02/03/2020   castaneda: small gastric polyps, sessile, and fundic gland, small bowel bx normal   INDUCED ABORTION N/A 1980   POLYPECTOMY  02/03/2020   Procedure: POLYPECTOMY;  Surgeon: Eartha Angelia Sieving, MD;  Location: [facility] ENDO SUITE;  Service: Gastroenterology;;   VEIN SURGERY      Family History  Problem Relation Age of Onset   Depression Mother    Pneumonia Mother     Social History   Socioeconomic History   Marital status: Married    Spouse name: Not on file   Number of children: Not on file   Years of education: Not on file   Highest education level: Not on file  Occupational History   Occupation: Disabled   Tobacco Use   Smoking status: Never    Passive exposure: Current   Smokeless tobacco: Never  Vaping Use   Vaping status: Never Used  Substance and Sexual Activity   Alcohol use: Yes    Alcohol/week: 10.0 standard drinks of alcohol    Types: 10 Cans of beer per week    Comment: daily, last drank last night   Drug use: Yes    Types: Marijuana    Comment: daily   Sexual activity: Not Currently    Birth control/protection: Post-menopausal  Other Topics Concern   Not on file  Social History Narrative   Pt lives in [place] with husband.  She is on disability.  Pt stated that she receives outpatient psychiatry services through [facility].  She does not receive outpatient therapy.   Social Drivers of Corporate investment banker Strain: Low Risk  (10/23/2022)   Overall Financial Resource Strain (CARDIA)    Difficulty of Paying Living Expenses: Not hard at all  Food Insecurity: No Food Insecurity (10/22/2023)   Hunger Vital Sign    Worried About Running Out of Food in the Last Year: Never true    Ran Out of Food in the Last Year: Never true  Transportation Needs: No Transportation Needs (10/22/2023)   PRAPARE - Administrator, Civil Service (Medical): No    Lack of Transportation (Non-Medical): No  Physical Activity: Insufficiently  Active (10/23/2022)   Exercise Vital Sign    Days of Exercise per Week: 2 days    Minutes of Exercise per Session: 10 min  Stress: No Stress Concern Present (10/23/2022)   [employer] of Occupational Health - Occupational Stress Questionnaire    Feeling of Stress : Not at all  Social Connections: Moderately Isolated (10/23/2022)   Social Connection and Isolation Panel    Frequency of Communication with Friends and Family: More than three times a week    Frequency of Social Gatherings with Friends and Family: More than three times a week    Attends Religious Services: Never    Database administrator or Organizations: No    Attends  Banker Meetings: Never    Marital Status: Married  Catering manager Violence: Not At Risk (10/22/2023)   Humiliation, Afraid, Rape, and Kick questionnaire    Fear of Current or Ex-Partner: No    Emotionally Abused: No    Physically Abused: No    Sexually Abused: No     Physical Exam   Vitals:   11/22/23 0558 11/22/23 0615  BP: (!) 115/39 (!) 116/54  Pulse: 94 (!) 102  Resp: 20 18  Temp: 97.8 F (36.6 C)   SpO2: 98% 98%    CONSTITUTIONAL: Well-appearing, NAD NEURO/PSYCH:  Alert and oriented x 3, no focal deficits EYES:  eyes equal and reactive ENT/NECK:  no LAD, no JVD CARDIO: Regular rate, well-perfused, normal S1 and S2 PULM:  CTAB no wheezing or rhonchi GI/GU:  non-distended, non-tender MSK/SPINE:  No gross deformities, no edema SKIN:  no rash, atraumatic   *Additional and/or pertinent findings included in MDM below  Diagnostic and Interventional Summary    EKG Interpretation Date/Time:    Ventricular Rate:    PR Interval:    QRS Duration:    QT Interval:    QTC Calculation:   R Axis:      Text Interpretation:         Labs Reviewed - No data to display  No orders to display    Medications  LORazepam  (ATIVAN ) tablet 1 mg (1 mg Oral Given 11/22/23 9373)     Procedures  /  Critical Care Procedures  ED Course and Medical Decision Making  Initial Impression and Ddx On my initial assessment patient is resting comfortably but when I wake her she begins to tremble and talk rapidly about her anxiety.  Vitals are reassuring, doubt any medical emergent process.  Cleared by psych yesterday.  Says that she did not take her medications yesterday because she was here in the waiting room all day.  Past medical/surgical history that increases complexity of ED encounter: Anxiety  Interpretation of Diagnostics Laboratory and/or imaging options to aid in the diagnosis/care of the patient were considered.  After careful history and physical examination, it  was determined that there was no indication for diagnostics at this time.  Patient Reassessment and Ultimate Disposition/Management     Discharge, patient encouraged to keep her follow-up with outpatient psychiatry and to take her home medications as prescribed.  Patient management required discussion with the following services or consulting groups:  None  Complexity of Problems Addressed Acute complicated illness or Injury  Additional Data Reviewed and Analyzed Further history obtained from: None  Additional Factors Impacting ED Encounter Risk None  Ozell HERO. Theadore, MD [facility] Emergency Medicine [facility] [email or web address]  Final Clinical Impressions(s) / ED Diagnoses     ICD-10-CM   1.  Anxiety  F41.9       ED Discharge Orders     None        Discharge Instructions Discussed with and Provided to Patient:   Discharge Instructions   None      Theadore Ozell HERO, MD 11/22/23 [phone]

## 2023-11-22 NOTE — ED Provider Notes (Signed)
 Signout from Dr. Theadore.  63 year old female with chronic anxiety here with worsening anxiety.  Seen and cleared by psychiatry yesterday.  Was discharged today by Dr. Theadore.  Patient states her anxiety is out of control and she does not feel she can safely leave. Physical Exam  BP 129/69   Pulse (!) 104   Temp 97.8 F (36.6 C) (Oral)   Resp 18   Ht 5' 6 (1.676 m)   Wt 79 kg   SpO2 97%   BMI 28.11 kg/m   Physical Exam  Procedures  Procedures  ED Course / MDM    Medical Decision Making Risk Prescription drug management.   Had a long discussion with patient regarding psychiatry recommendations from yesterday.  She will take an injection today to help her but she understands she needs to follow-up with her treatment team.       Towana Ozell BROCKS, MD 11/22/23 1731

## 2023-11-22 NOTE — Discharge Instructions (Addendum)
 You were evaluated in the Emergency Department and after careful evaluation, we did not find any emergent condition requiring admission or further testing in the hospital.  Your exam/testing today is overall reassuring.  Recommend close follow-up with your psychiatrist, primary care doctor.  Please return to the Emergency Department if you experience any worsening of your condition.   Thank you for allowing us  to be a part of your care.

## 2023-11-22 NOTE — ED Triage Notes (Signed)
 Pt bib EMS for increased anxiety. Pt seen here yesterday for same and discharged to home despite wanting to stay so we could see how she gets in the mornings.

## 2023-11-25 ENCOUNTER — Emergency Department (HOSPITAL_COMMUNITY)
Admission: EM | Admit: 2023-11-25 | Discharge: 2023-11-25 | Disposition: A | Discharge status: Home / Self Care | Attending: Emergency Medicine | Admitting: Emergency Medicine

## 2023-11-25 ENCOUNTER — Other Ambulatory Visit: Payer: Self-pay

## 2023-11-25 DIAGNOSIS — F419 Anxiety disorder, unspecified: Secondary | ICD-10-CM | POA: Diagnosis not present

## 2023-11-25 DIAGNOSIS — Z0389 Encounter for observation for other suspected diseases and conditions ruled out: Secondary | ICD-10-CM | POA: Diagnosis not present

## 2023-11-25 DIAGNOSIS — F1910 Other psychoactive substance abuse, uncomplicated: Secondary | ICD-10-CM | POA: Insufficient documentation

## 2023-11-25 MED ORDER — LORAZEPAM 1 MG PO TABS
2.0000 mg | ORAL_TABLET | Freq: Once | ORAL | Status: AC
  Administered 2023-11-25: 2 mg via ORAL
  Filled 2023-11-25: qty 2

## 2023-11-25 NOTE — ED Notes (Signed)
 Pt stated she was too anxious for vital signs. Pt stated she needed an injection before she could stand it.

## 2023-11-25 NOTE — ED Triage Notes (Signed)
 Pt arrived via [facility] c/o severe anxiety, panic attacks, vertigo, was seen here for the SAME recently and given outpatient resources, unable to clarify if pt has attempted to follow up with outpatient resources for mental health, denies SI/HI

## 2023-11-25 NOTE — Discharge Instructions (Addendum)
 Please see the phone number above for the outpatient psychiatry clinic here in [place], like we talked about last time you will need to follow the rules of the clinic by not drinking alcohol or smoking marijuana, they will set an appointment for you and you can be seen.  We will not be giving you any prescriptions for Ativan  or clonazepam  or any other drugs as sedatives today.  Continue your Seroquel , schedule appointments with a psychiatrist or your therapist or your family doctor.

## 2023-11-25 NOTE — ED Provider Notes (Signed)
 [place] EMERGENCY DEPARTMENT AT [facility] Provider Note   CSN: [id number] Arrival date & time: 11/25/23  1318     Patient presents with: No chief complaint on file.   Dawn Kelley is a 63 y.o. female.   HPI   This patient is a 63 year old female who suffers with chronic anxiety, she reports today that she has had ongoing severe anxiety despite taking her increased doses of Seroquel .  She has been seen recently in the hospital and was seen by psychiatry and was told to take increased doses of Seroquel , her benzodiazepines which she had been on for years had been stopped and not refilled, she presents today stating that her anxiety is out of control and she would like an injection of Ativan .  The last time I saw this patient she was given specific instructions to follow-up with [facility], she states that she called and was told that because she was not 24 hours clean off of substances they would not see her at least based on her report.  I asked if she called back and she has not, she has not stop smoking marijuana and has not stopped drinking alcohol.  She then states that she has no one to follow-up with, she readily admits that she is not taking care of her own problems by calling the psychiatrist to arrange her own follow-up.  The patient denies suicidality, she denies depression, she suffers with a severe anxiety which is chronic and she has been here multiple times for the same including a couple of days ago, during which time the patient was seen for anxiety on the 11th, she was seen on the 10th, she was seen a month ago as well and at least once if not more times per month going back over the last year or so.  This is all chronic issues.  She denies physical complaints  Prior to Admission medications   Medication Sig Start Date End Date Taking? Authorizing Provider  antiseptic oral rinse (BIOTENE) LIQD 15 mLs by Mouth Rinse route as needed for dry mouth.  10/31/23   Jadapalle, Sree, MD  busPIRone  (BUSPAR ) 15 MG tablet Take 15 mg by mouth 3 (three) times daily. 08/16/23   [provider]  FLUoxetine  (PROZAC ) 20 MG capsule Take 1 capsule (20 mg total) by mouth daily. 11/01/23   Jadapalle, Sree, MD  gabapentin  (NEURONTIN ) 300 MG capsule Take 1 capsule (300 mg total) by mouth 3 (three) times daily. 11/01/23   Donnelly Mellow, MD  lamoTRIgine  (LAMICTAL ) 25 MG tablet Take 1 tablet (25 mg total) by mouth 2 (two) times daily. 11/01/23   Jadapalle, Sree, MD  levothyroxine  (SYNTHROID ) 25 MCG tablet Take 1 tablet (25 mcg total) by mouth daily at 6 (six) AM. Patient taking differently: Take 25 mcg by mouth daily before breakfast. 11/01/23   Donnelly Mellow, MD  losartan  (COZAAR ) 25 MG tablet Take 25 mg by mouth daily.    [provider]  Multiple Vitamin (MULTIVITAMIN WITH MINERALS) TABS tablet Take 1 tablet by mouth daily. 11/01/23   Jadapalle, Sree, MD  omega-3 acid ethyl esters (LOVAZA ) 1 g capsule Take 1 capsule (1 g total) by mouth 2 (two) times daily. 11/01/23   Donnelly Mellow, MD  pantoprazole  (PROTONIX ) 40 MG tablet Take 40 mg by mouth daily.    [provider]  propranolol  (INDERAL ) 20 MG tablet Take 20 mg by mouth 3 (three) times daily. Patient not taking: Reported on 11/21/2023 03/29/20  [provider]  QUEtiapine  (SEROQUEL ) 100 MG tablet Take 100 mg by mouth at bedtime. 11/13/23   [provider]  QUEtiapine  (SEROQUEL ) 50 MG tablet Take 1 tablet (50 mg total) by mouth 3 (three) times daily. 11/21/23 12/21/23  Cleotilde Rogue, MD  rosuvastatin  (CRESTOR ) 20 MG tablet Take 20 mg by mouth daily. 10/15/22   [provider]    Allergies: Escitalopram, Seroquel  [quetiapine  fumarate], and Trazodone    Review of Systems  All other systems reviewed and are negative.   Updated Vital Signs There were no vitals taken for this visit.  Physical Exam Vitals and nursing note reviewed.  Constitutional:      General:  She is not in acute distress.    Appearance: She is well-developed.  HENT:     Head: Normocephalic and atraumatic.     Mouth/Throat:     Pharynx: No oropharyngeal exudate.  Eyes:     General: No scleral icterus.       Right eye: No discharge.        Left eye: No discharge.     Conjunctiva/sclera: Conjunctivae normal.     Pupils: Pupils are equal, round, and reactive to light.  Neck:     Thyroid : No thyromegaly.     Vascular: No JVD.  Cardiovascular:     Rate and Rhythm: Normal rate and regular rhythm.     Heart sounds: Normal heart sounds. No murmur heard.    No friction rub. No gallop.  Pulmonary:     Effort: Pulmonary effort is normal. No respiratory distress.     Breath sounds: Normal breath sounds. No wheezing or rales.  Abdominal:     General: Bowel sounds are normal. There is no distension.     Palpations: Abdomen is soft. There is no mass.     Tenderness: There is no abdominal tenderness.  Musculoskeletal:        General: No tenderness. Normal range of motion.     Cervical back: Normal range of motion and neck supple.  Lymphadenopathy:     Cervical: No cervical adenopathy.  Skin:    General: Skin is warm and dry.     Findings: No erythema or rash.  Neurological:     Mental Status: She is alert.     Coordination: Coordination normal.  Psychiatric:     Comments: The patient is anxious and hyperventilating, when I asked her to talk to me about other things the symptoms completely go away, she is not hallucinating     (all labs ordered are listed, but only abnormal results are displayed) Labs Reviewed - No data to display  EKG: None  Radiology: No results found.   Procedures   Medications Ordered in the ED  LORazepam  (ATIVAN ) tablet 2 mg (has no administration in time range)                                    Medical Decision Making  The patient was informed that she would get a single dose of oral Ativan .  I will not give her injectable medications,  but she is not suicidal, she has refused to take responsibility for her part of the process of getting a psychiatry appointment which includes stopping drinking alcohol for which she states she drinks several beers beers per day and stopping using marijuana which she states I guess I have a couple of puffs almost every day.  At this time the patient does not appear to be an extremis, she does not appear to be a danger to herself or others, she has to make the decision that she wants to pursue further psychiatric care, she has plenty of Seroquel  with her which I reviewed in the bottle.     Final diagnoses:  Anxiety  Substance abuse [facility])    ED Discharge Orders     None          Cleotilde Rogue, MD 11/25/23 1347

## 2023-11-28 DIAGNOSIS — Z91199 Patient's noncompliance with other medical treatment and regimen due to unspecified reason: Secondary | ICD-10-CM | POA: Diagnosis not present

## 2023-11-28 DIAGNOSIS — Z79899 Other long term (current) drug therapy: Secondary | ICD-10-CM | POA: Diagnosis not present

## 2023-11-28 DIAGNOSIS — I10 Essential (primary) hypertension: Secondary | ICD-10-CM | POA: Diagnosis not present

## 2023-12-20 DIAGNOSIS — Z79899 Other long term (current) drug therapy: Secondary | ICD-10-CM | POA: Diagnosis not present

## 2023-12-20 DIAGNOSIS — G629 Polyneuropathy, unspecified: Secondary | ICD-10-CM | POA: Diagnosis not present

## 2023-12-20 DIAGNOSIS — R7303 Prediabetes: Secondary | ICD-10-CM | POA: Diagnosis not present

## 2023-12-20 DIAGNOSIS — E039 Hypothyroidism, unspecified: Secondary | ICD-10-CM | POA: Diagnosis not present

## 2023-12-20 DIAGNOSIS — I10 Essential (primary) hypertension: Secondary | ICD-10-CM | POA: Diagnosis not present

## 2023-12-20 DIAGNOSIS — R300 Dysuria: Secondary | ICD-10-CM | POA: Diagnosis not present

## 2023-12-20 DIAGNOSIS — R5383 Other fatigue: Secondary | ICD-10-CM | POA: Diagnosis not present

## 2024-02-06 DIAGNOSIS — E7849 Other hyperlipidemia: Secondary | ICD-10-CM | POA: Diagnosis not present

## 2024-02-06 DIAGNOSIS — E038 Other specified hypothyroidism: Secondary | ICD-10-CM | POA: Diagnosis not present

## 2024-02-06 DIAGNOSIS — Z0000 Encounter for general adult medical examination without abnormal findings: Secondary | ICD-10-CM | POA: Diagnosis not present

## 2024-02-06 DIAGNOSIS — I700 Atherosclerosis of aorta: Secondary | ICD-10-CM | POA: Diagnosis not present

## 2024-02-06 DIAGNOSIS — I10 Essential (primary) hypertension: Secondary | ICD-10-CM | POA: Diagnosis not present

## 2024-02-12 DIAGNOSIS — E7849 Other hyperlipidemia: Secondary | ICD-10-CM | POA: Diagnosis not present

## 2024-02-12 DIAGNOSIS — I10 Essential (primary) hypertension: Secondary | ICD-10-CM | POA: Diagnosis not present

## 2024-02-12 DIAGNOSIS — E039 Hypothyroidism, unspecified: Secondary | ICD-10-CM | POA: Diagnosis not present

## 2024-02-12 DIAGNOSIS — R5383 Other fatigue: Secondary | ICD-10-CM | POA: Diagnosis not present

## 2024-02-12 DIAGNOSIS — Z13220 Encounter for screening for lipoid disorders: Secondary | ICD-10-CM | POA: Diagnosis not present

## 2024-02-14 DIAGNOSIS — Z1231 Encounter for screening mammogram for malignant neoplasm of breast: Secondary | ICD-10-CM | POA: Diagnosis not present

## 2024-02-26 ENCOUNTER — Encounter (INDEPENDENT_AMBULATORY_CARE_PROVIDER_SITE_OTHER): Payer: Self-pay | Admitting: Gastroenterology

## 2024-03-05 DIAGNOSIS — Z79899 Other long term (current) drug therapy: Secondary | ICD-10-CM | POA: Diagnosis not present

## 2024-03-17 DIAGNOSIS — I700 Atherosclerosis of aorta: Secondary | ICD-10-CM | POA: Diagnosis not present

## 2024-03-17 DIAGNOSIS — I10 Essential (primary) hypertension: Secondary | ICD-10-CM | POA: Diagnosis not present

## 2024-03-17 DIAGNOSIS — E038 Other specified hypothyroidism: Secondary | ICD-10-CM | POA: Diagnosis not present

## 2024-03-17 DIAGNOSIS — E7849 Other hyperlipidemia: Secondary | ICD-10-CM | POA: Diagnosis not present

## 2024-03-27 NOTE — Telephone Encounter (Signed)
 Reason for Conversation: Delirious   Current Symptoms: Tearful,    Home Care Tried & If Effective: Unable to function, hasn't bathed or slept.  Denies:  homicidal/suicidal tendencies   Pt identifiers x2 confirmed.  Pt reports feeling delirious, states she is afraid for her life.  Hx: shock therapy, multiple ED visits, doesn't feel no other hospital can help her other than [facility].  Unable to function, hasn't bathed or slept.  Pt has someone with her.  RN advised to call 911, Pt tearful states, [facility], I can't go there.  RN advised Pt if she will not call 911 to have someone drive her to [facility] ED, Pt verbalized understanding.     1. MAIN CONCERN: What happened that made you call today?  Delirious - tearful, feels she in terror, unable function, can't bath.   General Assessment     Do you have pain:  Do you have any pain?:  (Comment: triage declined)         Protocols Used: Hallucinations-Adult-AH  No Additional Information on file.  Disposition: Call EMS 911 Now   Reason for Disposition: .  [1] Difficult to awaken or acting confused (e.g., disoriented, slurred speech) AND [2] new-onset

## 2024-03-27 NOTE — Telephone Encounter (Signed)
 Regarding: Unable to sleep/ Seeking advice ----- Message from Hadassah SQUIBB sent at 03/27/2024  7:49 PM EST ----- Clinic Name:[facility] Caller/Relationship:Self  Reason for call:Patient hasn't been able to sleep for weeks. Stated she's delirious and seeking advice  Additional comment:

## 2024-03-28 ENCOUNTER — Inpatient Hospital Stay
Admission: AD | Admit: 2024-03-28 | Discharge: 2024-04-03 | DRG: 885 | Disposition: A | Discharge status: Home / Self Care | Source: Intra-hospital | Attending: Child & Adolescent Psychiatry | Admitting: Child & Adolescent Psychiatry

## 2024-03-28 ENCOUNTER — Emergency Department (HOSPITAL_COMMUNITY)
Admission: EM | Admit: 2024-03-28 | Discharge: 2024-03-28 | Disposition: A | Discharge status: Other Acute Inpatient Hospital | Attending: Emergency Medicine | Admitting: Emergency Medicine

## 2024-03-28 ENCOUNTER — Encounter (HOSPITAL_COMMUNITY): Payer: Self-pay | Admitting: Emergency Medicine

## 2024-03-28 ENCOUNTER — Other Ambulatory Visit: Payer: Self-pay

## 2024-03-28 ENCOUNTER — Encounter: Payer: Self-pay | Admitting: Adult Health

## 2024-03-28 DIAGNOSIS — Z79890 Hormone replacement therapy (postmenopausal): Secondary | ICD-10-CM

## 2024-03-28 DIAGNOSIS — F4310 Post-traumatic stress disorder, unspecified: Secondary | ICD-10-CM | POA: Diagnosis present

## 2024-03-28 DIAGNOSIS — Z79899 Other long term (current) drug therapy: Secondary | ICD-10-CM

## 2024-03-28 DIAGNOSIS — G4700 Insomnia, unspecified: Secondary | ICD-10-CM | POA: Diagnosis present

## 2024-03-28 DIAGNOSIS — Z818 Family history of other mental and behavioral disorders: Secondary | ICD-10-CM

## 2024-03-28 DIAGNOSIS — F3163 Bipolar disorder, current episode mixed, severe, without psychotic features: Secondary | ICD-10-CM | POA: Diagnosis not present

## 2024-03-28 DIAGNOSIS — F1220 Cannabis dependence, uncomplicated: Secondary | ICD-10-CM

## 2024-03-28 DIAGNOSIS — Z8659 Personal history of other mental and behavioral disorders: Secondary | ICD-10-CM | POA: Diagnosis not present

## 2024-03-28 DIAGNOSIS — Z91148 Patient's other noncompliance with medication regimen for other reason: Secondary | ICD-10-CM | POA: Diagnosis not present

## 2024-03-28 DIAGNOSIS — F429 Obsessive-compulsive disorder, unspecified: Secondary | ICD-10-CM | POA: Diagnosis present

## 2024-03-28 DIAGNOSIS — Z888 Allergy status to other drugs, medicaments and biological substances status: Secondary | ICD-10-CM | POA: Diagnosis not present

## 2024-03-28 DIAGNOSIS — F1020 Alcohol dependence, uncomplicated: Secondary | ICD-10-CM | POA: Diagnosis not present

## 2024-03-28 DIAGNOSIS — F319 Bipolar disorder, unspecified: Principal | ICD-10-CM | POA: Diagnosis present

## 2024-03-28 DIAGNOSIS — F603 Borderline personality disorder: Secondary | ICD-10-CM | POA: Diagnosis present

## 2024-03-28 DIAGNOSIS — K219 Gastro-esophageal reflux disease without esophagitis: Secondary | ICD-10-CM | POA: Diagnosis present

## 2024-03-28 DIAGNOSIS — E039 Hypothyroidism, unspecified: Secondary | ICD-10-CM | POA: Diagnosis present

## 2024-03-28 DIAGNOSIS — F410 Panic disorder [episodic paroxysmal anxiety] without agoraphobia: Secondary | ICD-10-CM | POA: Diagnosis present

## 2024-03-28 DIAGNOSIS — F419 Anxiety disorder, unspecified: Secondary | ICD-10-CM

## 2024-03-28 DIAGNOSIS — F411 Generalized anxiety disorder: Secondary | ICD-10-CM | POA: Diagnosis present

## 2024-03-28 DIAGNOSIS — K589 Irritable bowel syndrome without diarrhea: Secondary | ICD-10-CM | POA: Diagnosis present

## 2024-03-28 LAB — ETHANOL: Alcohol, Ethyl (B): <15 mg/dL (ref <15)

## 2024-03-28 LAB — CBC WITH DIFFERENTIAL/PLATELET
Abs Immature Granulocytes: 0.03 K/uL (ref 0.00–0.07)
Basophils Absolute: 0 K/uL (ref 0.0–0.1)
Basophils Relative: 0 %
Eosinophils Absolute: 0 K/uL (ref 0.0–0.5)
Eosinophils Relative: 0 %
HCT: 42.7 % (ref 36.0–46.0)
Hemoglobin: 14.5 g/dL (ref 12.0–15.0)
Immature Granulocytes: 0 %
Lymphocytes Relative: 19 %
Lymphs Abs: 2 K/uL (ref 0.7–4.0)
MCH: 30.1 pg (ref 26.0–34.0)
MCHC: 34 g/dL (ref 30.0–36.0)
MCV: 88.8 fL (ref 80.0–100.0)
Monocytes Absolute: 0.6 K/uL (ref 0.1–1.0)
Monocytes Relative: 6 %
Neutro Abs: 7.6 K/uL (ref 1.7–7.7)
Neutrophils Relative %: 75 %
Platelets: 321 K/uL (ref 150–400)
RBC: 4.81 MIL/uL (ref 3.87–5.11)
RDW: 13.7 % (ref 11.5–15.5)
WBC: 10.3 K/uL (ref 4.0–10.5)
nRBC: 0 % (ref 0.0–0.2)

## 2024-03-28 LAB — COMPREHENSIVE METABOLIC PANEL WITH GFR
ALT: 19 U/L (ref 0–44)
AST: 19 U/L (ref 15–41)
Albumin: 4.4 g/dL (ref 3.5–5.0)
Alkaline Phosphatase: 111 U/L (ref 38–126)
Anion gap: 14 (ref 5–15)
BUN: 5 mg/dL — ABNORMAL LOW (ref 8–23)
CO2: 26 mmol/L (ref 22–32)
Calcium: 9.2 mg/dL (ref 8.9–10.3)
Chloride: 103 mmol/L (ref 98–111)
Creatinine, Ser: 0.51 mg/dL (ref 0.44–1.00)
GFR, Estimated: >60 mL/min (ref >60)
Glucose, Bld: 124 mg/dL — ABNORMAL HIGH (ref 70–99)
Potassium: 3.2 mmol/L — ABNORMAL LOW (ref 3.5–5.1)
Sodium: 142 mmol/L (ref 135–145)
Total Bilirubin: 0.2 mg/dL (ref 0.0–1.2)
Total Protein: 7.4 g/dL (ref 6.5–8.1)

## 2024-03-28 LAB — URINE DRUG SCREEN
Amphetamines: NEGATIVE
Barbiturates: NEGATIVE
Benzodiazepines: NEGATIVE
Cocaine: NEGATIVE
Fentanyl: NEGATIVE
Methadone Scn, Ur: NEGATIVE
Opiates: NEGATIVE
Tetrahydrocannabinol: POSITIVE — AB

## 2024-03-28 LAB — SARS CORONAVIRUS 2 BY RT PCR: SARS Coronavirus 2 by RT PCR: NEGATIVE

## 2024-03-28 LAB — TSH: TSH: 1.26 u[IU]/mL (ref 0.350–4.500)

## 2024-03-28 MED ORDER — BUSPIRONE HCL 5 MG PO TABS
15.0000 mg | ORAL_TABLET | Freq: Three times a day (TID) | ORAL | Status: DC
  Administered 2024-03-28 – 2024-03-29 (×3): 15 mg via ORAL
  Filled 2024-03-28 (×3): qty 3

## 2024-03-28 MED ORDER — ALUM & MAG HYDROXIDE-SIMETH 200-200-20 MG/5ML PO SUSP: 30.0000 mL | ORAL | Status: DC | PRN

## 2024-03-28 MED ORDER — OLANZAPINE 5 MG PO TBDP
5.0000 mg | ORAL_TABLET | Freq: Three times a day (TID) | ORAL | Status: DC | PRN
  Administered 2024-03-29 – 2024-04-02 (×5): 5 mg via ORAL
  Filled 2024-03-28 (×5): qty 1

## 2024-03-28 MED ORDER — POTASSIUM CHLORIDE CRYS ER 20 MEQ PO TBCR
40.0000 meq | EXTENDED_RELEASE_TABLET | Freq: Once | ORAL | Status: AC
  Administered 2024-03-28: 40 meq via ORAL
  Filled 2024-03-28: qty 2

## 2024-03-28 MED ORDER — OLANZAPINE 10 MG IM SOLR: 5.0000 mg | Freq: Three times a day (TID) | INTRAMUSCULAR | Status: DC | PRN

## 2024-03-28 MED ORDER — ROSUVASTATIN CALCIUM 10 MG PO TABS
20.0000 mg | ORAL_TABLET | Freq: Every day | ORAL | Status: DC
  Administered 2024-03-29 – 2024-04-03 (×5): 20 mg via ORAL
  Filled 2024-03-28 (×6): qty 2

## 2024-03-28 MED ORDER — VENLAFAXINE HCL ER 75 MG PO CP24
150.0000 mg | ORAL_CAPSULE | Freq: Every day | ORAL | Status: DC
  Administered 2024-03-29 – 2024-03-31 (×3): 150 mg via ORAL
  Filled 2024-03-28 (×3): qty 2

## 2024-03-28 MED ORDER — HYDROXYZINE HCL 25 MG PO TABS
25.0000 mg | ORAL_TABLET | Freq: Four times a day (QID) | ORAL | Status: DC | PRN
  Administered 2024-03-29 – 2024-04-03 (×13): 25 mg via ORAL
  Filled 2024-03-28 (×13): qty 1

## 2024-03-28 MED ORDER — LAMOTRIGINE 25 MG PO TABS
25.0000 mg | ORAL_TABLET | Freq: Two times a day (BID) | ORAL | Status: DC
  Administered 2024-03-28: 25 mg via ORAL
  Filled 2024-03-28: qty 1

## 2024-03-28 MED ORDER — BUSPIRONE HCL 5 MG PO TABS
15.0000 mg | ORAL_TABLET | Freq: Three times a day (TID) | ORAL | Status: DC
  Administered 2024-03-28: 15 mg via ORAL
  Filled 2024-03-28: qty 3

## 2024-03-28 MED ORDER — HYDROXYZINE HCL 25 MG PO TABS: 25.0000 mg | ORAL_TABLET | Freq: Four times a day (QID) | ORAL | Status: DC | PRN

## 2024-03-28 MED ORDER — OMEGA-3-ACID ETHYL ESTERS 1 G PO CAPS
1.0000 g | ORAL_CAPSULE | Freq: Two times a day (BID) | ORAL | Status: DC
  Administered 2024-03-28 – 2024-04-03 (×12): 1 g via ORAL
  Filled 2024-03-28 (×12): qty 1

## 2024-03-28 MED ORDER — LOSARTAN POTASSIUM 25 MG PO TABS
25.0000 mg | ORAL_TABLET | Freq: Every day | ORAL | Status: DC
Start: 2024-03-28 — End: 2024-03-28
  Administered 2024-03-28: 25 mg via ORAL
  Filled 2024-03-28: qty 1

## 2024-03-28 MED ORDER — OMEGA-3-ACID ETHYL ESTERS 1 G PO CAPS
1.0000 g | ORAL_CAPSULE | Freq: Two times a day (BID) | ORAL | Status: DC
  Administered 2024-03-28: 1 g via ORAL
  Filled 2024-03-28: qty 1

## 2024-03-28 MED ORDER — MAGNESIUM HYDROXIDE 400 MG/5ML PO SUSP: 30.0000 mL | Freq: Every day | ORAL | Status: DC | PRN

## 2024-03-28 MED ORDER — LORAZEPAM 1 MG PO TABS
2.0000 mg | ORAL_TABLET | Freq: Once | ORAL | Status: AC
  Administered 2024-03-28: 2 mg via ORAL
  Filled 2024-03-28: qty 2

## 2024-03-28 MED ORDER — BIOTENE DRY MOUTH MT LIQD: 15.0000 mL | OROMUCOSAL | Status: DC | PRN

## 2024-03-28 MED ORDER — ROSUVASTATIN CALCIUM 20 MG PO TABS
20.0000 mg | ORAL_TABLET | Freq: Every day | ORAL | Status: DC
  Administered 2024-03-28: 20 mg via ORAL
  Filled 2024-03-28: qty 1

## 2024-03-28 MED ORDER — VENLAFAXINE HCL ER 37.5 MG PO CP24: 150.0000 mg | ORAL_CAPSULE | Freq: Every day | ORAL | Status: DC

## 2024-03-28 MED ORDER — ADULT MULTIVITAMIN W/MINERALS CH
1.0000 | ORAL_TABLET | Freq: Every day | ORAL | Status: DC
  Administered 2024-03-28: 1 via ORAL
  Filled 2024-03-28: qty 1

## 2024-03-28 MED ORDER — ADULT MULTIVITAMIN W/MINERALS CH
1.0000 | ORAL_TABLET | Freq: Every day | ORAL | Status: DC
  Administered 2024-03-29 – 2024-04-03 (×6): 1 via ORAL
  Filled 2024-03-28 (×6): qty 1

## 2024-03-28 NOTE — ED Notes (Signed)
 Changed into BH scrubs

## 2024-03-28 NOTE — Group Note (Signed)
 Date:  03/28/2024 Time:  8:36 PM  Group Topic/Focus:  Identifying Needs:   The focus of this group is to help patients identify their personal needs that have been historically problematic and identify healthy behaviors to address their needs.    Pt did not attend group.  Rodgerick Gilliand L 03/28/2024, 8:36 PM

## 2024-03-28 NOTE — BH Assessment (Signed)
 Per Ileana, RN pt hasn't has her blood work yet.   Per EDP note: Patient requesting evaluation from mental health. Given the poor sleep and appetite pending screening labs to exclude medical conditions such as electrolyte disturbance, hyperthyroid.   Pt to be assessed once medically cleared.   [name], MS, [facility], [facility] Triage Specialist [phone]

## 2024-03-28 NOTE — Plan of Care (Signed)
°  Problem: Education: Goal: Ability to state activities that reduce stress will improve Outcome: Not Progressing   Problem: Self-Concept: Goal: Level of anxiety will decrease Outcome: Not Progressing
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
## 2024-03-28 NOTE — ED Notes (Addendum)
 Pt's belongings placed in locker. Room made safe for pt. Pt verbalized understanding of situation. Tele-sitter in room with pt.

## 2024-03-28 NOTE — BH Assessment (Signed)
 Clinician messaged Ileana Borer, RN: Dawn Kelley. It's Dawn Kelley with TTS. Is the pt able to engage in the assessment, if so the pt will need to be placed in a private room. Is the pt under IVC? Also is the pt medically cleared?   Clinician awaiting response.    Dawn JONETTA Broach, MS, [facility], [facility] Triage Specialist [phone]

## 2024-03-28 NOTE — BH Assessment (Signed)
 At 0650, Per Ileana, RN pt's labs are back. Clinician reports, pt to be assessed during day shift.    [name], MS, [facility], [facility] Triage Specialist [phone]

## 2024-03-28 NOTE — ED Notes (Signed)
 Pt doing TTS currently.

## 2024-03-28 NOTE — Progress Notes (Signed)
   03/28/24 1700  Psych Admission Type (Psych Patients Only)  Admission Status Voluntary  Psychosocial Assessment  Patient Complaints Anxiety;Sleep disturbance  Eye Contact Brief  Facial Expression Anxious  Affect Anxious  Speech Logical/coherent  Interaction Assertive  Motor Activity Rigidity  Appearance/Hygiene In scrubs  Behavior Characteristics Cooperative;Appropriate to situation  Thought Process  Coherency WDL  Content Preoccupation  Delusions None reported or observed  Perception WDL  Hallucination None reported or observed  Judgment Limited  Confusion WDL  Danger to Self  Current suicidal ideation? Denies  Danger to Others  Danger to Others None reported or observed   Newly admitted from [facility]. Patient with complaints of not being able to sleep for 4 months and have been having nightmares and night terrors. She states that she was a patient her 4 months ago and has been taking her meds the way they have been ordered. No acute distress noted. Very cooperative.

## 2024-03-28 NOTE — ED Triage Notes (Addendum)
 Pt bib EMS after she called stating that she hasn't been able to sleep. EMS reports multiple calls to pt's residence for same. States pt very restless. Pt reports she hasn't been taking her meds for months.

## 2024-03-28 NOTE — ED Provider Notes (Signed)
 [facility]ERGENCY DEPT [facility] Emergency Department Provider Note MRN:  [id number]  Arrival date & time: 03/28/24     Chief Complaint   Anxiety   History of Present Illness   Dawn Kelley is a 63 y.o. year-old female with a history of bipolar disorder presenting to the ED with chief complaint of anxiety  Crippling anxiety worsening over the past few weeks.  Not sleeping, not eating, fearful of everything, does not know how to take her medications correctly.  Denies SI HI or AVH.  Review of Systems  A thorough review of systems was obtained and all systems are negative except as noted in the HPI and PMH.   Patient's Health History    Past Medical History:  Diagnosis Date   Anxiety    Bipolar 1 disorder (HCC)    Depression    OCD (obsessive compulsive disorder)    PTSD (post-traumatic stress disorder)    S/P ECT (electroconvulsive therapy)     Past Surgical History:  Procedure Laterality Date   BIOPSY  02/03/2020   Procedure: BIOPSY;  Surgeon: Eartha Angelia Sieving, MD;  Location: [facility] ENDO SU[facility];  Service: Gastroenterology;;   COLONOSCOPY WITH PROPOFOL  N/A 02/03/2020   [name]: 3mm polyp in sigmoid colon  as well as hemorrhoids, histology consistent with tubular adenoma   ESOPHAGOGASTRODUODENOSCOPY (EGD) WITH PROPOFOL  N/A 02/03/2020   castaneda: small gastric polyps, sessile, and fundic gland, small bowel bx normal   INDUCED ABORTION N/A 1980   POLYPECTOMY  02/03/2020   Procedure: POLYPECTOMY;  Surgeon: Eartha Angelia Sieving, MD;  Location: [facility] END[facility] SU[facility];  Service: Gastroenterology;;   VEIN SURGERY      Family History  Problem Relation Age of Onset   Depression Mother    Pneumonia Mother     Social History   Socioeconomic History   Marital status: Married    Spouse name: Not on file   Number of children: Not on file   Years of education: Not on file   Highest education level: Not on file  Occupational History   Occupation: Disabled  Tobacco Use    Smoking status: Never    Passive exposure: Current   Smokeless tobacco: Never  Vaping Use   Vaping status: Never Used  Substance and Sexual Activity   Alcohol use: Yes    Alcohol/week: 10.0 standard drinks of alcohol    Types: 10 Cans of beer per week    Comment: daily, last drank last night   Drug use: Yes    Types: Marijuana    Comment: daily   Sexual activity: Not Currently    Birth control/protection: Post-menopausal  Other Topics Concern   Not on file  Social History Narrative   Pt lives in [place] with husband.  She is on disability.  Pt stated that she receives outpatient psychiatry services through [facility].  She does not receive outpatient therapy.   Social Drivers of Corporate Investment Banker Strain: Low Risk  (10/23/2022)   Overall Financial Resource Strain (CARDIA)    Difficulty of Paying Living Expenses: Not hard at all  Food Insecurity: No Food Insecurity (10/22/2023)   Hunger Vital Sign    Worried About Running Out of Food in the Last Year: Never true    Ran Out of Food in the Last Year: Never true  Transportation Needs: No Transportation Needs (10/22/2023)   PRAPARE - Administrator, Civil Service (Medical): No    Lack of Transportation (Non-Medical): No  Physical Activity: Insufficiently Active (10/23/2022)  Exercise Vital Sign    Days of Exercise per Week: 2 days    Minutes of Exercise per Session: 10 min  Stress: No Stress Concern Present (10/23/2022)   [employer] of Occupational Health - Occupational Stress Questionnaire    Feeling of Stress : Not at all  Social Connections: Moderately Isolated (10/23/2022)   Social Connection and Isolation Panel    Frequency of Communication with Friends and Family: More than three times a week    Frequency of Social Gatherings with Friends and Family: More than three times a week    Attends Religious Services: Never    Database Administrator or Organizations: No    Attends Tax Inspector Meetings: Never    Marital Status: Married  Catering Manager Violence: Not At Risk (10/22/2023)   Humiliation, Afraid, Rape, and Kick questionnaire    Fear of Current or Ex-Partner: No    Emotionally Abused: No    Physically Abused: No    Sexually Abused: No     Physical Exam   Vitals:   03/28/24 0412  BP: (!) 158/79  Pulse: 78  Resp: 20  Temp: 98.2 F (36.8 C)  SpO2: 98%    CONSTITUTIONAL: Well-appearing, NAD NEURO/PSYCH:  Alert and oriented x 3, no focal deficits EYES:  eyes equal and reactive ENT/NECK:  no LAD, no JVD CARDIO: Regular rate, well-perfused, normal S1 and S2 PULM:  CTAB no wheezing or rhonchi GI/GU:  non-distended, non-tender MSK/SPINE:  No gross deformities, no edema SKIN:  no rash, atraumatic   *Additional and/or pertinent findings included in MDM below  Diagnostic and Interventional Summary    EKG Interpretation Date/Time:    Ventricular Rate:    PR Interval:    QRS Duration:    QT Interval:    QTC Calculation:   R Axis:      Text Interpretation:         Labs Reviewed  COMPREHENSIVE METABOLIC PANEL WITH GFR - Abnormal; Notable for the following components:      Result Value   Potassium 3.2 (*)    Glucose, Bld 124 (*)    BUN 5 (*)    All other components within normal limits  URINE DRUG SCREEN - Abnormal; Notable for the following components:   Tetrahydrocannabinol POSITIVE (*)    All other components within normal limits  ETHANOL  CBC WITH DIFFERENTIAL/PLATELET  TSH    No orders to display    Medications  LORazepam  (ATIVAN ) tablet 2 mg (2 mg Oral Given 03/28/24 0529)     Procedures  /  Critical Care Procedures  ED Course and Medical Decision Making  Initial Impression and Ddx Generalized anxiety significantly impacting her life.  Patient requesting evaluation from mental health.  Given the poor sleep and appetite pending screening labs to exclude medical conditions such as electrolyte disturbance,  hyperthyroid.  Past medical/surgical history that increases complexity of ED encounter: Anxiety  Interpretation of Diagnostics I personally reviewed the Laboratory Testing and my interpretation is as follows: No significant blood count or electrolyte disturbance.  EKG without concerning features, sinus rhythm  Patient Reassessment and Ultimate Disposition/Management     Medically cleared awaiting TTS recommendations.  Patient management required discussion with the following services or consulting groups:  None  Complexity of Problems Addressed Acute complicated illness or Injury  Additional Data Reviewed and Analyzed Further history obtained from: None  Additional Factors Impacting ED Encounter Risk None  Ozell HERO. Theadore, MD [facility] Emergency Medicine [facility]  [facility] [email or web address]  Final Clinical Impressions(s) / ED Diagnoses     ICD-10-CM   1. Anxiety  F41.9       ED Discharge Orders     None        Discharge Instructions Discussed with and Provided to Patient:   Discharge Instructions   None      Theadore Ozell HERO, MD 03/28/24 [phone]

## 2024-03-29 DIAGNOSIS — F411 Generalized anxiety disorder: Secondary | ICD-10-CM

## 2024-03-29 DIAGNOSIS — F319 Bipolar disorder, unspecified: Secondary | ICD-10-CM

## 2024-03-29 DIAGNOSIS — Z8659 Personal history of other mental and behavioral disorders: Secondary | ICD-10-CM | POA: Diagnosis not present

## 2024-03-29 DIAGNOSIS — F1220 Cannabis dependence, uncomplicated: Secondary | ICD-10-CM

## 2024-03-29 MED ORDER — GABAPENTIN 300 MG PO CAPS
300.0000 mg | ORAL_CAPSULE | Freq: Three times a day (TID) | ORAL | Status: DC
  Administered 2024-03-29 – 2024-04-03 (×15): 300 mg via ORAL
  Filled 2024-03-29 (×15): qty 1

## 2024-03-29 MED ORDER — MIRTAZAPINE 15 MG PO TABS
7.5000 mg | ORAL_TABLET | Freq: Every day | ORAL | Status: DC
  Administered 2024-03-29 – 2024-04-02 (×5): 7.5 mg via ORAL
  Filled 2024-03-29 (×5): qty 1

## 2024-03-29 NOTE — Progress Notes (Signed)
   03/29/24 0104  Psychosocial Assessment  Patient Complaints Anxiety  Eye Contact Brief  Facial Expression Anxious  Affect Anxious  Speech Logical/coherent  Interaction Assertive  Motor Activity Restless  Appearance/Hygiene In scrubs  Behavior Characteristics Cooperative;Anxious  Mood Anxious  Thought Process  Coherency WDL  Content WDL  Delusions None reported or observed  Perception WDL  Hallucination None reported or observed  Judgment Impaired  Confusion WDL  Danger to Self  Current suicidal ideation? Denies  Danger to Others  Danger to Others None reported or observed     Estimated Sleeping Duration (Last 24 Hours): 8.25-9.00 hours (Due to Daylight Saving Time, the durations displayed may not accurately represent documentation during the time change interval)

## 2024-03-29 NOTE — Plan of Care (Signed)
  Problem: Education: Goal: Ability to state activities that reduce stress will improve Outcome: Not Progressing   Problem: Coping: Goal: Ability to identify and develop effective coping behavior will improve Outcome: Not Progressing   Problem: Self-Concept: Goal: Ability to identify factors that promote anxiety will improve Outcome: Not Progressing Goal: Level of anxiety will decrease Outcome: Not Progressing Goal: Ability to modify response to factors that promote anxiety will improve Outcome: Not Progressing   

## 2024-03-29 NOTE — Group Note (Signed)
 Date:  03/29/2024 Time:  7:07 PM  Group Topic/Focus:  Coping With Mental Health Crisis:   The purpose of this group is to help patients identify strategies for coping with mental health crisis.  Group discusses possible causes of crisis and ways to manage them effectively. Healthy Communication:   The focus of this group is to discuss communication, barriers to communication, as well as healthy ways to communicate with others. Wellness Toolbox:   The focus of this group is to discuss various aspects of wellness, balancing those aspects and exploring ways to increase the ability to experience wellness.  Patients will create a wellness toolbox for use upon discharge.    Participation Level:  Did Not Attend  Participation Quality:    Affect:    Cognitive:    Insight:   Engagement in Group:    Modes of Intervention:    Additional Comments:    Dawn Kelley L Victorhugo Preis 03/29/2024, 7:07 PM

## 2024-03-29 NOTE — [facility] Counselor (Signed)
 CSW attempted to assessment  patient when she  became very emotional.  Patient asked the CSW could  read the medicare rights and additional forms to her.  Patient did not feel comfortable enough to sign the paperwork.  CSW assured her that she could complete the form when she felt comfortable later this week.  Patient then asked if she could pray.  CSW told her it was fine.  After the patient prayed she seem to relax a little.
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
## 2024-03-29 NOTE — Progress Notes (Signed)
 Patient is a voluntary admission to [facility] with history of multiple admissions including to this unit back in June.  Her complaints are of anxiety and not being able to sleep for 4 months.  Patient slept 9 hrs last night.  Is mostly concerned about being prescribed the correct medications but can't elaborate on what exactly she is not getting.  Did not request any prn medications.  Has been tearful a few times with staff but interacts well with peers and seems to enjoy talking with them and joining in with groups. Will continue to monitor.
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
## 2024-03-29 NOTE — Group Note (Signed)
 [facility] LCSW Group Therapy Note   Group Date: 03/29/2024 Start Time: 1100 End Time: 1135   Type of Therapy/Topic:  Group Therapy:  Emotion Regulation  Participation Level:  Active   Mood:  Description of Group:    The purpose of this group is to assist patients in learning to regulate negative emotions and experience positive emotions. Patients will be guided to discuss ways in which they have been vulnerable to their negative emotions. These vulnerabilities will be juxtaposed with experiences of positive emotions or situations, and patients challenged to use positive emotions to combat negative ones. Special emphasis will be placed on coping with negative emotions in conflict situations, and patients will process healthy conflict resolution skills.  Therapeutic Goals: Patient will identify two positive emotions or experiences to reflect on in order to balance out negative emotions:  Patient will label two or more emotions that they find the most difficult to experience:  Patient will be able to demonstrate positive conflict resolution skills through discussion or role plays:   Summary of Patient Progress:  The facilitator and patient discussed emotional regulation and how to regulate negative emotions and experience positive emotions.  Group members identify two positive emotions to describe their mood.The facilitator and patient reflected on how their emotions can shape their current thoughts and feelings.  The patient was receptive to feedback from peers and facilitator and contributed to creating a supportive environment, encouraging others to open up and share.      Therapeutic Modalities:   Cognitive Behavioral Therapy Feelings Identification Dialectical Behavioral Therapy   Rexene LELON Mae, LCSWA

## 2024-03-29 NOTE — Group Note (Signed)
 Date:  03/29/2024 Time:  10:45 AM  Group Topic/Focus:  Getting fit with Hulon Ferron    Participation Level:  Did Not Attend    [name] Bias 03/29/2024, 10:45 AM

## 2024-03-30 DIAGNOSIS — F410 Panic disorder [episodic paroxysmal anxiety] without agoraphobia: Secondary | ICD-10-CM

## 2024-03-30 DIAGNOSIS — F411 Generalized anxiety disorder: Secondary | ICD-10-CM | POA: Insufficient documentation

## 2024-03-30 DIAGNOSIS — F603 Borderline personality disorder: Secondary | ICD-10-CM

## 2024-03-30 MED ORDER — DOXEPIN HCL 50 MG PO CAPS
50.0000 mg | ORAL_CAPSULE | Freq: Every day | ORAL | Status: DC
  Administered 2024-03-31 – 2024-04-02 (×3): 50 mg via ORAL
  Filled 2024-03-30 (×3): qty 1

## 2024-03-30 NOTE — Group Note (Signed)
 Physical/Occupational Therapy Group Note  Group Topic: Yoga  Group Date: 03/30/2024 Start Time: 1300 End Time: 1325 Facilitators: Ollie Esty, Alm Hamilton, PT   Group Description: Group participated with series of yoga poses, designed to emphasize functional standing balance, core stability, generalized flexibility and overall posture.  Incorporated deep breathing techniques with poses, working to promote relaxation, mindfulness and focus with targeted activities.   Discussed benefits of yoga in improving mood and self-esteem, reducing stress and anxiety, and promoting functional strength and balance for each participant.  Discussed ways to integrate into each participant's daily routine.  Provided handout with written and pictorial descriptions of included yoga movements to be utilized as appropriate outside of group time.  Therapeutic Goal(s):  Demonstrate safe ability to participate with yoga poses during group activity. Identify one benefit of participation with yoga poses as part of each participant's exercise/movement routine. Identify 1-2 individual poses that participant feels most beneficial to his/her needs and that he/she can easily replicate outside of group.  Individual Participation: Did not attend  Participation Level:   Participation Quality:   Behavior:   Speech/Thought Process:   Affect/Mood:   Insight:   Judgement:   Modes of Intervention:   Patient Response to Interventions:    Plan: Continue to engage patient in PT/OT groups 1 - 2x/week.  CHARM Hamilton Bertin PT, DPT 03/30/24, 1:49 PM

## 2024-03-30 NOTE — BH IP Treatment Plan (Signed)
 Interdisciplinary Treatment and Diagnostic Plan Update  03/30/2024 Time of Session: 10:12 AM  Dawn Kelley MRN: [id number]  Principal Diagnosis: Bipolar affective disorder [facility])  Secondary Diagnoses: Principal Problem:   Bipolar affective disorder (HCC)   Current Medications:  Current Facility-Administered Medications  Medication Dose Route Frequency Provider Last Rate Last Admin   alum & mag hydroxide-simeth (MAALOX/MYLANTA) 200-200-20 MG/5ML suspension 30 mL  30 mL Oral Q4H PRN Mills, Shnese E, NP       antiseptic oral rinse (BIOTENE) solution 15 mL  15 mL Mouth Rinse PRN Moishe Burton E, NP       gabapentin  (NEURONTIN ) capsule 300 mg  300 mg Oral TID Madaram, Kondal R, MD   300 mg at 03/30/24 9074   hydrOXYzine  (ATARAX ) tablet 25 mg  25 mg Oral Q6H PRN Mills, Shnese E, NP   25 mg at 03/30/24 1325   magnesium  hydroxide (MILK OF MAGNESIA) suspension 30 mL  30 mL Oral Daily PRN Mills, Shnese E, NP       mirtazapine  (REMERON ) tablet 7.5 mg  7.5 mg Oral QHS Madaram, Kondal R, MD   7.5 mg at 03/29/24 2116   multivitamin with minerals tablet 1 tablet  1 tablet Oral Daily Moishe Burton E, NP   1 tablet at 03/30/24 9074   OLANZapine  (ZYPREXA ) injection 5 mg  5 mg Intramuscular TID PRN Mills, Shnese E, NP       OLANZapine  zydis (ZYPREXA ) disintegrating tablet 5 mg  5 mg Oral TID PRN Mills, Shnese E, NP   5 mg at 03/30/24 0518   omega-3 acid ethyl esters (LOVAZA ) capsule 1 g  1 g Oral BID Mills, Shnese E, NP   1 g at 03/30/24 9075   rosuvastatin  (CRESTOR ) tablet 20 mg  20 mg Oral Daily Mills, Shnese E, NP   20 mg at 03/30/24 9074   venlafaxine  XR (EFFEXOR -XR) 24 hr capsule 150 mg  150 mg Oral Q breakfast Mills, Shnese E, NP   150 mg at 03/30/24 0754   PTA Medications: Medications Prior to Admission  Medication Sig Dispense Refill Last Dose/Taking   antiseptic oral rinse (BIOTENE) LIQD 15 mLs by Mouth Rinse route as needed for dry mouth. 150 mL 0    busPIRone  (BUSPAR ) 15 MG tablet Take 15 mg  by mouth 3 (three) times daily.      cloNIDine (CATAPRES) 0.1 MG tablet Take 0.1 mg by mouth daily.      FLUoxetine  (PROZAC ) 20 MG capsule Take 1 capsule (20 mg total) by mouth daily. 30 capsule 0    hydrOXYzine  (ATARAX ) 10 MG tablet Take 10 mg by mouth every 8 (eight) hours as needed for itching or anxiety.      lamoTRIgine  (LAMICTAL ) 25 MG tablet Take 1 tablet (25 mg total) by mouth 2 (two) times daily. 60 tablet 0    levothyroxine  (SYNTHROID ) 25 MCG tablet Take 1 tablet (25 mcg total) by mouth daily at 6 (six) AM. (Patient taking differently: Take 25 mcg by mouth daily before breakfast.) 30 tablet 0    LORazepam  (ATIVAN ) 2 MG tablet Take 2 mg by mouth 2 (two) times daily as needed.      losartan  (COZAAR ) 50 MG tablet Take 50 mg by mouth daily.      mirtazapine  (REMERON ) 15 MG tablet Take 15 mg by mouth at bedtime.      Multiple Vitamin (MULTIVITAMIN WITH MINERALS) TABS tablet Take 1 tablet by mouth daily. 30 tablet 0    omega-3 acid ethyl esters (LOVAZA )  1 g capsule Take 1 capsule (1 g total) by mouth 2 (two) times daily. 60 capsule 0    QUEtiapine  (SEROQUEL ) 50 MG tablet Take 1 tablet (50 mg total) by mouth 3 (three) times daily. 90 tablet 0    rosuvastatin  (CRESTOR ) 40 MG tablet Take 40 mg by mouth daily.      venlafaxine  XR (EFFEXOR -XR) 150 MG 24 hr capsule Take 150 mg by mouth daily.      VRAYLAR 1.5 MG capsule Take 1.5 mg by mouth daily.       Patient Stressors:    Patient Strengths:    Treatment Modalities: Medication Management, Group therapy, Case management,  1 to 1 session with clinician, Psychoeducation, Recreational therapy.   Physician Treatment Plan for Primary Diagnosis: Bipolar affective disorder (HCC) Long Term Goal(s):     Short Term Goals:    Medication Management: Evaluate patient's response, side effects, and tolerance of medication regimen.  Therapeutic Interventions: 1 to 1 sessions, Unit Group sessions and Medication administration.  Evaluation of  Outcomes: Not Progressing  Physician Treatment Plan for Secondary Diagnosis: Principal Problem:   Bipolar affective disorder (HCC)  Long Term Goal(s):     Short Term Goals:       Medication Management: Evaluate patient's response, side effects, and tolerance of medication regimen.  Therapeutic Interventions: 1 to 1 sessions, Unit Group sessions and Medication administration.  Evaluation of Outcomes: Not Progressing   RN Treatment Plan for Primary Diagnosis: Bipolar affective disorder (HCC) Long Term Goal(s): Knowledge of disease and therapeutic regimen to maintain health will improve  Short Term Goals: Ability to remain free from injury will improve, Ability to verbalize frustration and anger appropriately will improve, Ability to demonstrate self-control, Ability to participate in decision making will improve, Ability to verbalize feelings will improve, Ability to disclose and discuss suicidal ideas, Ability to identify and develop effective coping behaviors will improve, and Compliance with prescribed medications will improve  Medication Management: RN will administer medications as ordered by provider, will assess and evaluate patient's response and provide education to patient for prescribed medication. RN will report any adverse and/or side effects to prescribing provider.  Therapeutic Interventions: 1 on 1 counseling sessions, Psychoeducation, Medication administration, Evaluate responses to treatment, Monitor vital signs and CBGs as ordered, Perform/monitor CIWA, COWS, AIMS and Fall Risk screenings as ordered, Perform wound care treatments as ordered.  Evaluation of Outcomes: Not Progressing   LCSW Treatment Plan for Primary Diagnosis: Bipolar affective disorder (HCC) Long Term Goal(s): Safe transition to appropriate next level of care at discharge, Engage patient in therapeutic group addressing interpersonal concerns.  Short Term Goals: Engage patient in aftercare planning with  referrals and resources, Increase social support, Increase ability to appropriately verbalize feelings, Increase emotional regulation, Facilitate acceptance of mental health diagnosis and concerns, Facilitate patient progression through stages of change regarding substance use diagnoses and concerns, Identify triggers associated with mental health/substance abuse issues, and Increase skills for wellness and recovery  Therapeutic Interventions: Assess for all discharge needs, 1 to 1 time with Social worker, Explore available resources and support systems, Assess for adequacy in community support network, Educate family and significant other(s) on suicide prevention, Complete Psychosocial Assessment, Interpersonal group therapy.  Evaluation of Outcomes: Not Progressing   Progress in Treatment: Attending groups: Yes. and No. Participating in groups: Yes. and No. Taking medication as prescribed: Yes. Toleration medication: Yes. Family/Significant other contact made: No, will contact:  CSW will contact if given permission  Patient understands diagnosis: Yes. Discussing patient  identified problems/goals with staff: Yes. Medical problems stabilized or resolved: Yes. Denies suicidal/homicidal ideation: Yes. Issues/concerns per patient self-inventory: No. Other: None   New problem(s) identified: No, Describe:  None identified  New Short Term/Long Term Goal(s):  elimination of symptoms of psychosis, medication management for mood stabilization; elimination of SI thoughts; development of comprehensive mental wellness plan.   Patient Goals:  Something inside of me, it won't let me rest, I'm afraid I'm going to die    Discharge Plan or Barriers: CSW will assist with appropriate discharge planning   Reason for Continuation of Hospitalization: Anxiety Medication stabilization  Estimated Length of Stay: 1 to 7 days  Last 3 [place] Suicide Severity Risk Score: Flowsheet Row Admission (Current)  from 03/28/2024 in [facility] BE[facility]AVIORAL MEDICINE Most recent reading at 03/28/2024  5:00 PM ED from 03/28/2024 in [facility] Emergency Department at [facility] Most recent reading at 03/28/2024  4:10 AM ED from 11/25/2023 in [facility] Emergency Department at [name] Most recent reading at 11/25/2023  1:33 PM  C-SSRS RISK CATEGORY No Risk No Risk No Risk    Last PHQ 2/9 Scores:    10/01/2023    4:19 PM 11/27/2022   10:15 AM 11/19/2022    1:42 PM  Depression screen PHQ 2/9  Decreased Interest 3 3 3   Down, Depressed, Hopeless 3 3   PHQ - 2 Score 6 6 3   Altered sleeping 3 2 2   Tired, decreased energy 0 3 3  Change in appetite 1 3 3   Feeling bad or failure about yourself  0 3 3  Trouble concentrating 0 3 3  Moving slowly or fidgety/restless 0 2 0  Suicidal thoughts 0 0 0  PHQ-9 Score 10  22  17    Difficult doing work/chores Very difficult Very difficult Somewhat difficult     Data saved with a previous flowsheet row definition    Scribe for Treatment Team: Lum JONETTA Croft, ISRAEL 03/30/2024 1:56 PM

## 2024-03-30 NOTE — Progress Notes (Signed)
   03/30/24 0104  Psych Admission Type (Psych Patients Only)  Admission Status Voluntary  Psychosocial Assessment  Patient Complaints None  Eye Contact Fair  Facial Expression Anxious  Affect Anxious  Speech Logical/coherent  Interaction Assertive  Motor Activity Restless  Appearance/Hygiene Improved  Behavior Characteristics Cooperative  Mood Anxious  Thought Process  Coherency WDL  Content WDL  Delusions None reported or observed  Perception WDL  Hallucination None reported or observed  Judgment Impaired  Confusion WDL  Danger to Self  Current suicidal ideation? Denies  Danger to Others  Danger to Others None reported or observed    Estimated Sleeping Duration (Last 24 Hours): 6.75-8.00 hours (Due to Daylight Saving Time, the durations displayed may not accurately represent documentation during the time change interval)

## 2024-03-30 NOTE — Progress Notes (Signed)
   03/30/24 1700  Psych Admission Type (Psych Patients Only)  Admission Status Voluntary  Psychosocial Assessment  Patient Complaints None  Eye Contact Fair  Facial Expression Anxious  Affect Anxious  Speech Logical/coherent  Interaction Assertive  Motor Activity Restless  Appearance/Hygiene Improved  Behavior Characteristics Cooperative  Mood Anxious  Thought Process  Coherency WDL  Content WDL  Delusions None reported or observed  Perception WDL  Hallucination None reported or observed  Judgment Impaired  Confusion WDL  Danger to Self  Current suicidal ideation? Denies  Danger to Others  Danger to Others None reported or observed

## 2024-03-30 NOTE — Group Note (Signed)
 Recreation Therapy Group Note   Group Topic:Other  Group Date: 03/30/2024 Start Time: 1400 End Time: 1450 Facilitators: Celestia Jeoffrey BRAVO, LRT, CTRS Location: Dayroom  Activity Description/Intervention: Therapeutic Drumming. Patients with peers and staff were given the opportunity to engage in a leader facilitated [facility] Group Empowerment Drumming Circle with staff from the [employer], in partnership with [employer]. Teaching laboratory technician and trained [place]alt [place], Norleen Mon leading with LRT observing and documenting intervention and pt response. This evidenced-based practice targets 7 areas of health and wellbeing in the human experience including: stress-reduction, exercise, self-expression, camaraderie/support, nurturing, spirituality, and music-making (leisure).    Goal Area(s) Addresses:  Patient will engage in pro-social way in music group.  Patient will follow directions of drum leader on the first prompt. Patient will demonstrate no behavioral issues during group.  Patient will identify if a reduction in stress level occurs as a result of participation in therapeutic drum circle.   Affect/Mood: Full range   Participation Level: Minimal    Clinical Observations/Individualized Feedback: [name] was in and out of group. Pt was tearful.   Plan: Continue to engage patient in RT group sessions 2-3x/week.   Jeoffrey BRAVO Celestia, LRT, CTRS 03/30/2024 5:20 PM

## 2024-03-30 NOTE — Progress Notes (Signed)
   03/30/24 1648  Spiritual Encounters  Type of Visit Initial  Care provided to: Patient  Referral source Chaplain team  Reason for visit Routine spiritual support  OnCall Visit No  Interventions  Spiritual Care Interventions Made Established relationship of care and support;Compassionate presence;Reflective listening;Reconciliation with self/others;Narrative/life review;Prayer;Encouragement   Visited with Dawn Kelley per patient request for prayer.  Provided compassionate presence, support, reflective listening.  Discussed family history, discussed her health and treatment concerns.  Prayed with patient and offered chaplain follow-up.

## 2024-03-30 NOTE — Plan of Care (Signed)
  Problem: Self-Concept: Goal: Level of anxiety will decrease Outcome: Progressing
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
## 2024-03-30 NOTE — Progress Notes (Signed)
 [facility] MD Progress Note  03/30/2024 1:03 PM Dawn Kelley  MRN:  [id number]  The patient is a 63 year old female brought in by EMS for severe insomnia and worsening anxiety. EMS reports multiple recent calls for similar complaints. The patient states she discontinued her psychiatric medications "for months" due to difficulty finding an outpatient provider. Since stopping medications, she reports debilitating anxiety, fearfulness, poor appetite, and inability to perform ADLs. Her boyfriend notes a decline over the past 3-4 months, including poor hygiene. Patient is admitted to [facility] unit with Q15 min safety monitoring. Multidisciplinary team approach is offered. Medication management; group/milieu therapy is offered.  Subjective:  Chart reviewed, case discussed in multidisciplinary meeting, patient seen during rounds.   Patient with the treatment team.  Patient was noted to be physiologically calm before entering the room and when she entered the room and sat down she is visibly shaking, breathing hard, stating she does not know what is going on with her, talks about her body shaking, her urine smelling, significant amount of anxiety.  She talks about not able to follow-up with outpatient psychiatry after her discharges both from [facility] followed by rehospitalization at [facility] and discharge.  She reports she was prescribed medications at [facility] which she does not recall and states that she stopped all her medications.  She does not give any good reasoning for stopping the medications.  She reports living with her significant partner of many years whom she claims that drives her to the appointments at times.  She denies SI/HI/plan and denies auditory/visual hallucinations.  She rates her anxiety as 10 out of 10 with panic attacks.  She reports multiple psychosomatic symptoms at this time.  Provider discussed the plan to look into her medications closely as there are no records from [facility].  Past  Psychiatric History: see h&P Family History:  Family History  Problem Relation Age of Onset   Depression Mother    Pneumonia Mother    Social History:  Social History   Substance and Sexual Activity  Alcohol Use Yes   Alcohol/week: 10.0 standard drinks of alcohol   Types: 10 Cans of beer per week   Comment: daily, last drank last night     Social History   Substance and Sexual Activity  Drug Use Yes   Types: Marijuana   Comment: daily    Social History   Socioeconomic History   Marital status: Married    Spouse name: Not on file   Number of children: Not on file   Years of education: Not on file   Highest education level: Not on file  Occupational History   Occupation: Disabled  Tobacco Use   Smoking status: Never    Passive exposure: Current   Smokeless tobacco: Never  Vaping Use   Vaping status: Never Used  Substance and Sexual Activity   Alcohol use: Yes    Alcohol/week: 10.0 standard drinks of alcohol    Types: 10 Cans of beer per week    Comment: daily, last drank last night   Drug use: Yes    Types: Marijuana    Comment: daily   Sexual activity: Not Currently    Birth control/protection: Post-menopausal  Other Topics Concern   Not on file  Social History Narrative   Pt lives in [place] with husband.  She is on disability.  Pt stated that she receives outpatient psychiatry services through [facility].  She does not receive outpatient therapy.   Social Drivers of Health  Financial Resource Strain: Low Risk  (10/23/2022)   Overall Financial Resource Strain (CARDIA)    Difficulty of Paying Living Expenses: Not hard at all  Food Insecurity: No Food Insecurity (03/28/2024)   Hunger Vital Sign    Worried About Running Out of Food in the Last Year: Never true    Ran Out of Food in the Last Year: Never true  Transportation Needs: No Transportation Needs (03/28/2024)   PRAPARE - Administrator, Civil Service (Medical): No    Lack of  Transportation (Non-Medical): No  Physical Activity: Insufficiently Active (10/23/2022)   Exercise Vital Sign    Days of Exercise per Week: 2 days    Minutes of Exercise per Session: 10 min  Stress: No Stress Concern Present (10/23/2022)   [employer] of Occupational Health - Occupational Stress Questionnaire    Feeling of Stress : Not at all  Social Connections: Moderately Isolated (03/28/2024)   Social Connection and Isolation Panel    Frequency of Communication with Friends and Family: Three times a week    Frequency of Social Gatherings with Friends and Family: Three times a week    Attends Religious Services: Never    Active Member of Clubs or Organizations: No    Attends Banker Meetings: Never    Marital Status: Married   Past Medical History:  Past Medical History:  Diagnosis Date   Anxiety    Bipolar 1 disorder (HCC)    Depression    OCD (obsessive compulsive disorder)    PTSD (post-traumatic stress disorder)    S/P ECT (electroconvulsive therapy)     Past Surgical History:  Procedure Laterality Date   BIOPSY  02/03/2020   Procedure: BIOPSY;  Surgeon: Eartha Angelia Sieving, MD;  [facility];  Service: Gastroenterology;;   COLONOSCOPY WITH PROPOFOL  N/A 02/03/2020   [name]: 3mm polyp in sigmoid colon  as well as hemorrhoids, histology consistent with tubular adenoma   ESOPHAGOGASTRODUODENOSCOPY (EGD) WITH PROPOFOL  N/A 02/03/2020   castaneda: small gastric polyps, sessile, and fundic gland, small bowel bx normal   INDUCED ABORTION N/A 1980   POLYPECTOMY  02/03/2020   Procedure: POLYPECTOMY;  Surgeon: Eartha Angelia, Sieving, MD;  Location: [facility];  Service: Gastroenterology;;   VEIN SURGERY      Current Medications: Current Facility-Administered Medications  Medication Dose Route Frequency Provider Last Rate Last Admin   alum & mag hydroxide-simeth (MAALOX/MYLANTA) 200-200-20 MG/5ML suspension 30 mL  30 mL Oral Q4H PRN  Mills, Shnese E, NP       antiseptic oral rinse (BIOTENE) solution 15 mL  15 mL Mouth Rinse PRN Moishe Burton E, NP       gabapentin  (NEURONTIN ) capsule 300 mg  300 mg Oral TID Madaram, Kondal R, MD   300 mg at 03/30/24 0925   hydrOXYzine  (ATARAX ) tablet 25 mg  25 mg Oral Q6H PRN Mills, Shnese E, NP   25 mg at 03/30/24 0518   magnesium  hydroxide (MILK OF MAGNESIA) suspension 30 mL  30 mL Oral Daily PRN Mills, Shnese E, NP       mirtazapine  (REMERON ) tablet 7.5 mg  7.5 mg Oral QHS Madaram, Kondal R, MD   7.5 mg at 03/29/24 2116   multivitamin with minerals tablet 1 tablet  1 tablet Oral Daily Moishe Burton E, NP   1 tablet at 03/30/24 9074   OLANZapine  (ZYPREXA ) injection 5 mg  5 mg Intramuscular TID PRN Moishe Burton BRAVO, NP  OLANZapine  zydis (ZYPREXA ) disintegrating tablet 5 mg  5 mg Oral TID PRN Mills, Shnese E, NP   5 mg at 03/30/24 0518   omega-3 acid ethyl esters (LOVAZA ) capsule 1 g  1 g Oral BID Mills, Shnese E, NP   1 g at 03/30/24 9075   rosuvastatin  (CRESTOR ) tablet 20 mg  20 mg Oral Daily Mills, Shnese E, NP   20 mg at 03/30/24 9074   venlafaxine  XR (EFFEXOR -XR) 24 hr capsule 150 mg  150 mg Oral Q breakfast Mills, Shnese E, NP   150 mg at 03/30/24 9245    Lab Results: No results found for this or any previous visit (from the past 48 hours).  Blood Alcohol level:  Lab Results  Component Value Date   [facility] <15 03/28/2024   ETH <15 11/21/2023    Metabolic Disorder Labs: Lab Results  Component Value Date   HGBA1C 5.7 (H) 11/27/2022   No results found for: PROLACTIN No results found for: CHOL, TRIG, HDL, CHOLHDL, VLDL, LDLCALC  Physical Findings: AIMS:  , ,  ,  ,    CIWA:    COWS:      Psychiatric Specialty Exam:  Presentation  General Appearance:  Appropriate for Environment; Casual  Eye Contact: Fair  Speech: Clear and Coherent  Speech Volume: Normal    Mood and Affect  Mood: Anxious  Affect: Appropriate   Thought Process  Thought  Processes: Coherent  Orientation:Full (Time, Place and Person)  Thought Content:Abstract Reasoning  Hallucinations: denies Ideas of Reference:None  Suicidal Thoughts:denies Homicidal Thoughts:denies  Sensorium  Memory: Immediate Fair; Recent Fair; Remote Fair  Judgment: Fair  Insight: Fair   Art Therapist  Concentration: Fair  Attention Span: Fair  Recall: [employer] of Knowledge: Fair  Language: Fair   Psychomotor Activity  Psychomotor Activity:No data recorded Musculoskeletal: Strength & Muscle Tone: within normal limits Gait & Station: normal Assets  Assets: Manufacturing Systems Engineer; Desire for Improvement; Resilience; Social Support    Physical Exam: Physical Exam Vitals and nursing note reviewed.    ROS Blood pressure (!) 141/90, pulse 87, temperature (!) 97.5 F (36.4 C), resp. rate 18, height 5' 6 (1.676 m), weight 80.7 kg, SpO2 100%. Body mass index is 28.73 kg/m.  Diagnosis: GAD with panic attacks Borderline personality   PLAN: Safety and Monitoring:  -- Voluntary admission to inpatient psychiatric unit for safety, stabilization and treatment  -- Daily contact with patient to assess and evaluate symptoms and progress in treatment  -- Patient's case to be discussed in multi-disciplinary team meeting  -- Observation Level : q15 minute checks  -- Vital signs:  q12 hours  -- Precautions: suicide, elopement, and assault -- Encouraged patient to participate in unit milieu and in scheduled group therapies  2. Psychiatric Treatment:  Scheduled Medications:  Effexor  Xr 150 mg daily Remeron  7.5 mg at bedtime Gabapentin  300mg  TID   -- The risks/benefits/side-effects/alternatives to this medication were discussed in detail with the patient and time was given for questions. The patient consents to medication trial.  3. Medical Issues Being Addressed:     4. Discharge Planning:   -- Social work and case management to assist with  discharge planning and identification of hospital follow-up needs prior to discharge  -- Estimated LOS: 3-4 days  Allyn Foil, MD 03/30/2024, 1:03 PM

## 2024-03-30 NOTE — Progress Notes (Signed)
   03/30/24 2000  Psych Admission Type (Psych Patients Only)  Admission Status Voluntary  Psychosocial Assessment  Patient Complaints Anxiety;Crying spells;Insomnia;Restlessness  Eye Contact Intense  Facial Expression Anxious;Worried  Affect Anxious;Sad  Speech Rapid  Interaction Assertive  Motor Activity Fidgety;Restless  Appearance/Hygiene Unremarkable  Behavior Characteristics Anxious;Fidgety  Mood Anxious  Thought Process  Coherency WDL  Content WDL  Delusions None reported or observed  Perception WDL  Hallucination None reported or observed  Judgment Impaired  Confusion None  Danger to Self  Current suicidal ideation? Denies  Danger to Others  Danger to Others None reported or observed

## 2024-03-30 NOTE — Group Note (Signed)
 Date:  03/30/2024 Time:  11:14 AM  Group Topic/Focus:  Morning strech with Tiernan Millikin    Participation Level:  Minimal  Participation Quality:  Inattentive  Affect:  Tearful  Cognitive:  none  Insight: None  Engagement in Group:  Poor  Modes of Intervention:  Activity  Additional Comments:  patient left at the start of group and came back at the end of group  [name] Bias 03/30/2024, 11:14 AM

## 2024-03-31 LAB — LIPID PANEL
Cholesterol: 151 mg/dL (ref 0–200)
HDL: 51 mg/dL (ref >40)
LDL Cholesterol: 78 mg/dL (ref 0–99)
Total CHOL/HDL Ratio: 3 ratio
Triglycerides: 110 mg/dL (ref <150)
VLDL: 22 mg/dL (ref 0–40)

## 2024-03-31 MED ORDER — LOSARTAN POTASSIUM 25 MG PO TABS
50.0000 mg | ORAL_TABLET | Freq: Every day | ORAL | Status: DC
  Administered 2024-04-01 – 2024-04-03 (×3): 50 mg via ORAL
  Filled 2024-03-31 (×3): qty 2

## 2024-03-31 MED ORDER — VENLAFAXINE HCL ER 75 MG PO CP24
225.0000 mg | ORAL_CAPSULE | Freq: Every day | ORAL | Status: DC
Start: 2024-04-01 — End: 2024-04-03
  Administered 2024-04-01 – 2024-04-03 (×3): 225 mg via ORAL
  Filled 2024-03-31 (×3): qty 3

## 2024-03-31 MED ORDER — PROPRANOLOL HCL 20 MG PO TABS
20.0000 mg | ORAL_TABLET | Freq: Two times a day (BID) | ORAL | Status: DC
  Administered 2024-03-31 – 2024-04-03 (×7): 20 mg via ORAL
  Filled 2024-03-31 (×7): qty 1

## 2024-03-31 MED ORDER — LEVOTHYROXINE SODIUM 25 MCG PO TABS
25.0000 ug | ORAL_TABLET | Freq: Every day | ORAL | Status: DC
  Administered 2024-04-01 – 2024-04-03 (×3): 25 ug via ORAL
  Filled 2024-03-31 (×3): qty 1

## 2024-03-31 NOTE — Plan of Care (Signed)
  Problem: Education: Goal: Ability to state activities that reduce stress will improve Outcome: Progressing   Problem: Self-Concept: Goal: Ability to identify factors that promote anxiety will improve Outcome: Not Progressing Goal: Level of anxiety will decrease Outcome: Not Progressing

## 2024-03-31 NOTE — Progress Notes (Signed)
 [facility] MD Progress Note  03/31/2024 11:02 AM Dawn Kelley  MRN:  [id number]  The patient is a 63 year old female brought in by EMS for severe insomnia and worsening anxiety. EMS reports multiple recent calls for similar complaints. The patient states she discontinued her psychiatric medications "for months" due to difficulty finding an outpatient provider. Since stopping medications, she reports debilitating anxiety, fearfulness, poor appetite, and inability to perform ADLs. Her boyfriend notes a decline over the past 3-4 months, including poor hygiene. Patient is admitted to [facility] unit with Q15 min safety monitoring. Multidisciplinary team approach is offered. Medication management; group/milieu therapy is offered.  Subjective:  Chart reviewed, case discussed in multidisciplinary meeting, patient seen during rounds.  Patient met with this provider multiple times throughout the day with different somatic questions and concerns.  During initial conversation she continues to remain fixated on her physical symptoms of having discomfort in her stomach then she explains that she was diagnosed with IBS.  Patient continues to talk about worsening anxiety and panic attacks.  She denies SI/HI/plan and denies auditory/visual hallucinations.  Patient is noted to be coming to the nurses station every 5 minutes requesting benzos.  Provider educated patient about benzodiazepine dependence documented in her chart and to show consistency by going for her outpatient appointments rather than utilizing emergency room's and inpatient facilities to get the prescription of medications but never followed through outpatient.  Patient expressed her  understanding  Past Psychiatric History: see h&P Family History:  Family History  Problem Relation Age of Onset   Depression Mother    Pneumonia Mother    Social History:  Social History   Substance and Sexual Activity  Alcohol Use Yes   Alcohol/week: 10.0 standard  drinks of alcohol   Types: 10 Cans of beer per week   Comment: daily, last drank last night     Social History   Substance and Sexual Activity  Drug Use Yes   Types: Marijuana   Comment: daily    Social History   Socioeconomic History   Marital status: Married    Spouse name: Not on file   Number of children: Not on file   Years of education: Not on file   Highest education level: Not on file  Occupational History   Occupation: Disabled  Tobacco Use   Smoking status: Never    Passive exposure: Current   Smokeless tobacco: Never  Vaping Use   Vaping status: Never Used  Substance and Sexual Activity   Alcohol use: Yes    Alcohol/week: 10.0 standard drinks of alcohol    Types: 10 Cans of beer per week    Comment: daily, last drank last night   Drug use: Yes    Types: Marijuana    Comment: daily   Sexual activity: Not Currently    Birth control/protection: Post-menopausal  Other Topics Concern   Not on file  Social History Narrative   Pt lives in [place] with husband.  She is on disability.  Pt stated that she receives outpatient psychiatry services through [facility].  She does not receive outpatient therapy.   Social Drivers of Corporate Investment Banker Strain: Low Risk  (10/23/2022)   Overall Financial Resource Strain (CARDIA)    Difficulty of Paying Living Expenses: Not hard at all  Food Insecurity: No Food Insecurity (03/28/2024)   Hunger Vital Sign    Worried About Running Out of Food in the Last Year: Never true    Ran Out of Food  in the Last Year: Never true  Transportation Needs: No Transportation Needs (03/28/2024)   PRAPARE - Administrator, Civil Service (Medical): No    Lack of Transportation (Non-Medical): No  Physical Activity: Insufficiently Active (10/23/2022)   Exercise Vital Sign    Days of Exercise per Week: 2 days    Minutes of Exercise per Session: 10 min  Stress: No Stress Concern Present (10/23/2022)   [employer] of Occupational Health - Occupational Stress Questionnaire    Feeling of Stress : Not at all  Social Connections: Moderately Isolated (03/28/2024)   Social Connection and Isolation Panel    Frequency of Communication with Friends and Family: Three times a week    Frequency of Social Gatherings with Friends and Family: Three times a week    Attends Religious Services: Never    Active Member of Clubs or Organizations: No    Attends Banker Meetings: Never    Marital Status: Married   Past Medical History:  Past Medical History:  Diagnosis Date   Anxiety    Bipolar 1 disorder (HCC)    Depression    OCD (obsessive compulsive disorder)    PTSD (post-traumatic stress disorder)    S/P ECT (electroconvulsive therapy)     Past Surgical History:  Procedure Laterality Date   BIOPSY  02/03/2020   Procedure: BIOPSY;  Surgeon: Eartha Angelia Sieving, MD;  [facility];  Service: Gastroenterology;;   COLONOSCOPY WITH PROPOFOL  N/A 02/03/2020   Eartha: 3mm polyp in sigmoid colon  as well as hemorrhoids, histology consistent with tubular adenoma   ESOPHAGOGASTRODUODENOSCOPY (EGD) WITH PROPOFOL  N/A 02/03/2020   castaneda: small gastric polyps, sessile, and fundic gland, small bowel bx normal   INDUCED ABORTION N/A 1980   POLYPECTOMY  02/03/2020   Procedure: POLYPECTOMY;  Surgeon: Eartha Angelia, Sieving, MD;  [facility];  Service: Gastroenterology;;   VEIN SURGERY      Current Medications: Current Facility-Administered Medications  Medication Dose Route Frequency Provider Last Rate Last Admin   alum & mag hydroxide-simeth (MAALOX/MYLANTA) 200-200-20 MG/5ML suspension 30 mL  30 mL Oral Q4H PRN Mills, Shnese E, NP       antiseptic oral rinse (BIOTENE) solution 15 mL  15 mL Mouth Rinse PRN Mills, Shnese E, NP       doxepin (SINEQUAN) capsule 50 mg  50 mg Oral QHS Schuyler Behan, MD       gabapentin  (NEURONTIN ) capsule 300 mg  300 mg Oral TID  Madaram, Kondal R, MD   300 mg at 03/31/24 0902   hydrOXYzine  (ATARAX ) tablet 25 mg  25 mg Oral Q6H PRN Mills, Shnese E, NP   25 mg at 03/31/24 9676   magnesium  hydroxide (MILK OF MAGNESIA) suspension 30 mL  30 mL Oral Daily PRN Mills, Shnese E, NP       mirtazapine  (REMERON ) tablet 7.5 mg  7.5 mg Oral QHS Madaram, Kondal R, MD   7.5 mg at 03/30/24 2036   multivitamin with minerals tablet 1 tablet  1 tablet Oral Daily Moishe Burton E, NP   1 tablet at 03/31/24 9097   OLANZapine  (ZYPREXA ) injection 5 mg  5 mg Intramuscular TID PRN Mills, Shnese E, NP       OLANZapine  zydis (ZYPREXA ) disintegrating tablet 5 mg  5 mg Oral TID PRN Mills, Shnese E, NP   5 mg at 03/30/24 0518   omega-3 acid ethyl esters (LOVAZA ) capsule 1 g  1 g Oral BID Moishe Burton  E, NP   1 g at 03/31/24 9097   rosuvastatin  (CRESTOR ) tablet 20 mg  20 mg Oral Daily Mills, Shnese E, NP   20 mg at 03/31/24 9097   venlafaxine  XR (EFFEXOR -XR) 24 hr capsule 150 mg  150 mg Oral Q breakfast Mills, Shnese E, NP   150 mg at 03/31/24 9253    Lab Results:  Results for orders placed or performed during the hospital encounter of 03/28/24 (from the past 48 hours)  Lipid panel     Status: None   Collection Time: 03/31/24  5:29 AM  Result Value Ref Range   Cholesterol 151 0 - 200 mg/dL    Comment:        ATP III CLASSIFICATION:  <200     mg/dL   Desirable  799-760  mg/dL   Borderline High  >=759    mg/dL   High           Triglycerides 110 <150 mg/dL   HDL 51 >59 mg/dL   Total CHOL/HDL Ratio 3.0 RATIO   VLDL 22 0 - 40 mg/dL   LDL Cholesterol 78 0 - 99 mg/dL    Comment:        Total Cholesterol/HDL:CHD Risk Coronary Heart Disease Risk Table                     Men   Women  1/2 Average Risk   3.4   3.3  Average Risk       5.0   4.4  2 X Average Risk   9.6   7.1  3 X Average Risk  23.4   11.0        Use the calculated Patient Ratio above and the CHD Risk Table to determine the patient's CHD Risk.        ATP III CLASSIFICATION  (LDL):  <100     mg/dL   Optimal  899-870  mg/dL   Near or Above                    Optimal  130-159  mg/dL   Borderline  839-810  mg/dL   High  >809     mg/dL   Very High Performed at [facility], [address] [place], [place] [address]     Blood Alcohol level:  Lab Results  Component Value Date   [facility] <15 03/28/2024   [facility] <15 11/21/2023    Metabolic Disorder Labs: Lab Results  Component Value Date   HGBA1C 5.7 (H) 11/27/2022   No results found for: PROLACTIN Lab Results  Component Value Date   CHOL 151 03/31/2024   TRIG 110 03/31/2024   HDL 51 03/31/2024   CHOLHDL 3.0 03/31/2024   VLDL 22 03/31/2024   LDLCALC 78 03/31/2024    Physical Findings: AIMS:  , ,  ,  ,    CIWA:    COWS:      Psychiatric Specialty Exam:  Presentation  General Appearance:  Appropriate for Environment; Casual  Eye Contact: Fair  Speech: Clear and Coherent  Speech Volume: Normal    Mood and Affect  Mood: Anxious  Affect: Appropriate   Thought Process  Thought Processes: Coherent  Orientation:Full (Time, Place and Person)  Thought Content:Abstract Reasoning  Hallucinations: denies Ideas of Reference:None  Suicidal Thoughts:denies Homicidal Thoughts:denies  Sensorium  Memory: Immediate Fair; Recent Fair; Remote Fair  Judgment: Fair  Insight: Fair   Art Therapist  Concentration: Fair  Attention Span:  Fair  Recall: D[employer] of Knowledge: Fair  Language: Fair   Psychomotor Activity  Psychomotor Activity:No data recorded Musculoskeletal: Strength & Muscle Tone: within normal limits Gait & Station: normal Assets  Assets: Manufacturing Systems Engineer; Desire for Improvement; Resilience; Social Support    Physical Exam: Physical Exam Vitals and nursing note reviewed.    ROS Blood pressure (!) 152/76, pulse 86, temperature 98.1 F (36.7 C), resp. rate 18, height 5' 6 (1.676 m), weight 80.7 kg, SpO2 99%. Body mass  index is 28.73 kg/m.  Diagnosis: GAD with panic attacks Borderline personality   PLAN: Safety and Monitoring:  -- Voluntary admission to inpatient psychiatric unit for safety, stabilization and treatment  -- Daily contact with patient to assess and evaluate symptoms and progress in treatment  -- Patient's case to be discussed in multi-disciplinary team meeting  -- Observation Level : q15 minute checks  -- Vital signs:  q12 hours  -- Precautions: suicide, elopement, and assault -- Encouraged patient to participate in unit milieu and in scheduled group therapies  2. Psychiatric Treatment:  Scheduled Medications:  Effexor  Xr 150 mg daily increased to 225 Remeron  7.5 mg at bedtime Gabapentin  300mg  TID  Doxepin 50 mg at bedtime Synthroid  25 mcg home med added -- The risks/benefits/side-effects/alternatives to this medication were discussed in detail with the patient and time was given for questions. The patient consents to medication trial.  3. Medical Issues Being Addressed:     4. Discharge Planning:   -- Social work and case management to assist with discharge planning and identification of hospital follow-up needs prior to discharge  -- Estimated LOS: 3-4 days  Brad Lieurance, MD 03/31/2024, 11:02 AM

## 2024-03-31 NOTE — Progress Notes (Signed)
   03/31/24 1930  Psych Admission Type (Psych Patients Only)  Admission Status Voluntary  Psychosocial Assessment  Patient Complaints Anxiety;Crying spells;Worrying;Nervousness;Insomnia;Restlessness  Eye Contact Intense  Facial Expression Anxious;Worried  Affect Anxious  Civil Service Fast Streamer;Restless  Appearance/Hygiene Unremarkable  Behavior Characteristics Anxious  Mood Anxious  Thought Process  Coherency WDL  Content WDL  Delusions None reported or observed  Perception WDL  Hallucination None reported or observed  Judgment Impaired  Confusion None  Danger to Self  Current suicidal ideation? Denies  Danger to Others  Danger to Others None reported or observed

## 2024-03-31 NOTE — Group Note (Signed)
 Date:  03/31/2024 Time:  11:08 AM  Group Topic/Focus:   Exercise provides significant physical and mental health benefits for seniors, including improved bone and muscle strength, a lower risk of falls, and better management of chronic conditions like heart disease and diabetes. It also boosts cognitive function, enhances mood, and can lead to a longer, more independent life.  Participation Level:  Active  Participation Quality:  Appropriate  Affect:  Appropriate  Cognitive:  Appropriate  Insight: Appropriate  Engagement in Group:  Engaged  Modes of Intervention:  Activity  Additional Comments:  N/A  Harlene LITTIE Gavel 03/31/2024, 11:08 AM

## 2024-03-31 NOTE — Progress Notes (Signed)
   03/31/24 0617  15 Minute Checks  Location Bedroom  Visual Appearance Calm  Behavior Sleeping  Sleep (Behavioral Health Patients Only)  Calculate sleep? (Click Yes once per 24 hr at 0600 safety check) Yes  Documented sleep last 24 hours 8.25

## 2024-03-31 NOTE — Group Note (Signed)
 LCSW Group Therapy Note  Group Date: 03/31/2024 Start Time: 1300 End Time: 1400   Type of Therapy and Topic:  Group Therapy: Positive Affirmations  Participation Level:  Active   Description of Group:   This group addressed positive affirmation towards self and others.  Patients went around the room and identified two positive things about themselves and two positive things about a peer in the room.  Patients reflected on how it felt to share something positive with others, to identify positive things about themselves, and to hear positive things from others/ Patients were encouraged to have a daily reflection of positive characteristics or circumstances.   Therapeutic Goals: Patients will verbalize two of their positive qualities Patients will demonstrate empathy for others by stating two positive qualities about a peer in the group Patients will verbalize their feelings when voicing positive self affirmations and when voicing positive affirmations of others Patients will discuss the potential positive impact on their wellness/recovery of focusing on positive traits of self and others.  Summary of Patient Progress:  The patient engaged in a life-review exercise, discussing the various factors that have contributed to their health and well-being. They completed a puzzle activity in which each piece was drawn and colored to represent a personal protective factor. The patient identified their support systems, goals, plans, hobbies, and leisure activities. They clearly communicated their future aspirations and participated in conversations with peers about personal goals and challenges.  Therapeutic Modalities:   Cognitive Behavioral Therapy Motivational Interviewing    Alveta CHRISTELLA Kerns, LCSWA 03/31/2024  2:05 PM

## 2024-03-31 NOTE — Group Note (Signed)
 Recreation Therapy Group Note   Group Topic:Coping Skills  Group Date: 03/31/2024 Start Time: 1400 End Time: 1455 Facilitators: Celestia Jeoffrey BRAVO, LRT, CTRS Location: Dayroom   Group Description: Mind Map.  Patient was provided a blank template of a diagram with 32 blank boxes in a tiered system, branching from the center (similar to a bubble chart). LRT directed patients to label the middle of the diagram Coping Skills. LRT and patients then came up with 8 different coping skills as examples. Pt were directed to record their coping skills in the 2nd tier boxes closest to the center.  Patients would then share their coping skills with the group as LRT wrote them out. LRT gave a handout of 99 different coping skills at the end of group. LRT and patients went outside to the courtyard for fresh air and sunlight with time remaining.   Goal Area(s) Addressed: Patients will be able to define "coping skills". Patient will identify new coping skills.  Patient will increase communication.   Affect/Mood: Full range   Participation Level: Minimal    Clinical Observations/Individualized Feedback: Dawn Kelley was originally present in group. Pt became emotional and walked out of group. Pt did not return.   Plan: Continue to engage patient in RT group sessions 2-3x/week.   Jeoffrey BRAVO Celestia, LRT, CTRS 03/31/2024 4:49 PM

## 2024-03-31 NOTE — Plan of Care (Signed)
°  Problem: Education: Goal: Ability to state activities that reduce stress will improve Outcome: Not Progressing   Problem: Self-Concept: Goal: Level of anxiety will decrease Outcome: Not Progressing

## 2024-03-31 NOTE — Plan of Care (Signed)
°  Problem: Self-Concept: Goal: Ability to identify factors that promote anxiety will improve Outcome: Not Progressing Goal: Level of anxiety will decrease Outcome: Not Progressing

## 2024-03-31 NOTE — Group Note (Signed)
 Date:  03/31/2024 Time:  10:42 PM  Group Topic/Focus:  Wrap-Up Group:   The focus of this group is to help patients review their daily goal of treatment and discuss progress on daily workbooks.    Participation Level:  Active  Participation Quality:  Attentive  Affect:  Blunted  Cognitive:  Alert  Insight: Good  Engagement in Group:  Limited  Modes of Intervention:  Discussion  Additional Comments:    Dawn Kelley Bunker 03/31/2024, 10:42 PM

## 2024-03-31 NOTE — Progress Notes (Signed)
   03/31/24 1000  Psych Admission Type (Psych Patients Only)  Admission Status Voluntary  Psychosocial Assessment  Patient Complaints Anxiety;Crying spells;Worrying  Eye Contact Intense  Facial Expression Anxious  Affect Anxious  Speech Pressured  Interaction Assertive  Motor Activity Restless  Appearance/Hygiene Unremarkable  Behavior Characteristics Anxious  Mood Anxious  Thought Process  Coherency WDL  Content WDL  Delusions None reported or observed  Perception WDL  Hallucination None reported or observed  Judgment Impaired  Confusion None  Danger to Self  Current suicidal ideation? Denies  Danger to Others  Danger to Others None reported or observed

## 2024-04-01 NOTE — Progress Notes (Signed)
 [facility] MD Progress Note  04/01/2024 1:03 PM Dawn Kelley  MRN:  [id number]  The patient is a 63 year old female brought in by EMS for severe insomnia and worsening anxiety. EMS reports multiple recent calls for similar complaints. The patient states she discontinued her psychiatric medications "for months" due to difficulty finding an outpatient provider. Since stopping medications, she reports debilitating anxiety, fearfulness, poor appetite, and inability to perform ADLs. Her boyfriend notes a decline over the past 3-4 months, including poor hygiene. Patient is admitted to [facility] unit with Q15 min safety monitoring. Multidisciplinary team approach is offered. Medication management; group/milieu therapy is offered.  Subjective:  Chart reviewed, case discussed in multidisciplinary meeting, patient seen during rounds.   Patient is noted to be coming to the nurses station multiple times throughout the day with multiple somatic complaints and requesting lab workup even though she received all the lab and urinalysis done which were completely normal.  Patient is observed to be very stable, laughing and talking to other peers in the day room but when she sees the provider or the nursing she immediately starts shaking and reports feeling extremely anxious and come up with multiple somatic problems like her nails are pink, her skin is crawling etc.  Patient vitals are stable and she is taking all her medications.  She denies SI/HI/plan.  Patient was educated thoroughly to participate in intensive outpatient services to help with her anxiety.  Patient and the team social worker called the patient's husband and updated about patient's need to follow-up with PHP programs and needing intensive psychotherapy to help with the anxiety. Past Psychiatric History: see h&P Family History:  Family History  Problem Relation Age of Onset   Depression Mother    Pneumonia Mother    Social History:  Social History    Substance and Sexual Activity  Alcohol Use Yes   Alcohol/week: 10.0 standard drinks of alcohol   Types: 10 Cans of beer per week   Comment: daily, last drank last night     Social History   Substance and Sexual Activity  Drug Use Yes   Types: Marijuana   Comment: daily    Social History   Socioeconomic History   Marital status: Married    Spouse name: Not on file   Number of children: Not on file   Years of education: Not on file   Highest education level: Not on file  Occupational History   Occupation: Disabled  Tobacco Use   Smoking status: Never    Passive exposure: Current   Smokeless tobacco: Never  Vaping Use   Vaping status: Never Used  Substance and Sexual Activity   Alcohol use: Yes    Alcohol/week: 10.0 standard drinks of alcohol    Types: 10 Cans of beer per week    Comment: daily, last drank last night   Drug use: Yes    Types: Marijuana    Comment: daily   Sexual activity: Not Currently    Birth control/protection: Post-menopausal  Other Topics Concern   Not on file  Social History Narrative   Pt lives in [place] with husband.  She is on disability.  Pt stated that she receives outpatient psychiatry services through [facility].  She does not receive outpatient therapy.   Social Drivers of Corporate Investment Banker Strain: Low Risk  (10/23/2022)   Overall Financial Resource Strain (CARDIA)    Difficulty of Paying Living Expenses: Not hard at all  Food Insecurity: No Food Insecurity (03/28/2024)  Hunger Vital Sign    Worried About Running Out of Food in the Last Year: Never true    Ran Out of Food in the Last Year: Never true  Transportation Needs: No Transportation Needs (03/28/2024)   PRAPARE - Administrator, Civil Service (Medical): No    Lack of Transportation (Non-Medical): No  Physical Activity: Insufficiently Active (10/23/2022)   Exercise Vital Sign    Days of Exercise per Week: 2 days    Minutes of Exercise per  Session: 10 min  Stress: No Stress Concern Present (10/23/2022)   [employer] of Occupational Health - Occupational Stress Questionnaire    Feeling of Stress : Not at all  Social Connections: Moderately Isolated (03/28/2024)   Social Connection and Isolation Panel    Frequency of Communication with Friends and Family: Three times a week    Frequency of Social Gatherings with Friends and Family: Three times a week    Attends Religious Services: Never    Active Member of Clubs or Organizations: No    Attends Banker Meetings: Never    Marital Status: Married   Past Medical History:  Past Medical History:  Diagnosis Date   Anxiety    Bipolar 1 disorder (HCC)    Depression    OCD (obsessive compulsive disorder)    PTSD (post-traumatic stress disorder)    S/P ECT (electroconvulsive therapy)     Past Surgical History:  Procedure Laterality Date   BIOPSY  02/03/2020   Procedure: BIOPSY;  Surgeon: Eartha Angelia Sieving, MD;  Location: [facility];  Service: Gastroenterology;;   COLONOSCOPY WITH PROPOFOL  N/A 02/03/2020   [name]: 3mm polyp in sigmoid colon  as well as hemorrhoids, histology consistent with tubular adenoma   ESOPHAGOGASTRODUODENOSCOPY (EGD) WITH PROPOFOL  N/A 02/03/2020   castaneda: small gastric polyps, sessile, and fundic gland, small bowel bx normal   INDUCED ABORTION N/A 1980   POLYPECTOMY  02/03/2020   Procedure: POLYPECTOMY;  Surgeon: Eartha Angelia, Sieving, MD;  Location: [facility];  Service: Gastroenterology;;   VEIN SURGERY      Current Medications: Current Facility-Administered Medications  Medication Dose Route Frequency Provider Last Rate Last Admin   alum & mag hydroxide-simeth (MAALOX/MYLANTA) 200-200-20 MG/5ML suspension 30 mL  30 mL Oral Q4H PRN Mills, Shnese E, NP       antiseptic oral rinse (BIOTENE) solution 15 mL  15 mL Mouth Rinse PRN Moishe Burton E, NP       doxepin  (SINEQUAN ) capsule 50 mg  50 mg Oral QHS  Arlis Yale, MD   50 mg at 03/31/24 2128   gabapentin  (NEURONTIN ) capsule 300 mg  300 mg Oral TID Madaram, Kondal R, MD   300 mg at 04/01/24 0935   hydrOXYzine  (ATARAX ) tablet 25 mg  25 mg Oral Q6H PRN Mills, Shnese E, NP   25 mg at 04/01/24 0645   levothyroxine  (SYNTHROID ) tablet 25 mcg  25 mcg Oral Q0600 Fathima Bartl, MD   25 mcg at 04/01/24 0610   losartan  (COZAAR ) tablet 50 mg  50 mg Oral Daily Tammie Yanda, MD   50 mg at 04/01/24 9187   magnesium  hydroxide (MILK OF MAGNESIA) suspension 30 mL  30 mL Oral Daily PRN Mills, Shnese E, NP       mirtazapine  (REMERON ) tablet 7.5 mg  7.5 mg Oral QHS Madaram, Kondal R, MD   7.5 mg at 03/31/24 2127   multivitamin with minerals tablet 1 tablet  1 tablet Oral Daily Mills, Shnese E,  NP   1 tablet at 04/01/24 0936   OLANZapine  (ZYPREXA ) injection 5 mg  5 mg Intramuscular TID PRN Mills, Shnese E, NP       OLANZapine  zydis (ZYPREXA ) disintegrating tablet 5 mg  5 mg Oral TID PRN Mills, Shnese E, NP   5 mg at 03/31/24 1550   omega-3 acid ethyl esters (LOVAZA ) capsule 1 g  1 g Oral BID Mills, Shnese E, NP   1 g at 04/01/24 9063   propranolol  (INDERAL ) tablet 20 mg  20 mg Oral BID Anne Sebring, MD   20 mg at 04/01/24 9063   rosuvastatin  (CRESTOR ) tablet 20 mg  20 mg Oral Daily Mills, Shnese E, NP   20 mg at 03/31/24 0902   venlafaxine  XR (EFFEXOR -XR) 24 hr capsule 225 mg  225 mg Oral Q breakfast Pedrohenrique Mcconville, MD   225 mg at 04/01/24 9188    Lab Results:  Results for orders placed or performed during the hospital encounter of 03/28/24 (from the past 48 hours)  Lipid panel     Status: None   Collection Time: 03/31/24  5:29 AM  Result Value Ref Range   Cholesterol 151 0 - 200 mg/dL    Comment:        ATP III CLASSIFICATION:  <200     mg/dL   Desirable  799-760  mg/dL   Borderline High  >=759    mg/dL   High           Triglycerides 110 <150 mg/dL   HDL 51 >59 mg/dL   Total CHOL/HDL Ratio 3.0 RATIO   VLDL 22 0 - 40 mg/dL   LDL Cholesterol  78 0 - 99 mg/dL    Comment:        Total Cholesterol/HDL:CHD Risk Coronary Heart Disease Risk Table                     Men   Women  1/2 Average Risk   3.4   3.3  Average Risk       5.0   4.4  2 X Average Risk   9.6   7.1  3 X Average Risk  23.4   11.0        Use the calculated Patient Ratio above and the CHD Risk Table to determine the patient's CHD Risk.        ATP III CLASSIFICATION (LDL):  <100     mg/dL   Optimal  899-870  mg/dL   Near or Above                    Optimal  130-159  mg/dL   Borderline  839-810  mg/dL   High  >809     mg/dL   Very High Performed at [facility], [address] [place], [place] [address]     Blood Alcohol level:  Lab Results  Component Value Date   [facility] <15 03/28/2024   ETH <15 11/21/2023    Metabolic Disorder Labs: Lab Results  Component Value Date   HGBA1C 5.7 (H) 11/27/2022   No results found for: PROLACTIN Lab Results  Component Value Date   CHOL 151 03/31/2024   TRIG 110 03/31/2024   HDL 51 03/31/2024   CHOLHDL 3.0 03/31/2024   VLDL 22 03/31/2024   LDLCALC 78 03/31/2024    Physical Findings: AIMS:  , ,  ,  ,    CIWA:    COWS:  Psychiatric Specialty Exam:  Presentation  General Appearance:  Appropriate for Environment; Casual  Eye Contact: Fair  Speech: Clear and Coherent  Speech Volume: Normal    Mood and Affect  Mood: Anxious  Affect: Appropriate   Thought Process  Thought Processes: Coherent  Orientation:Full (Time, Place and Person)  Thought Content:Abstract Reasoning  Hallucinations: denies Ideas of Reference:None  Suicidal Thoughts:denies Homicidal Thoughts:denies  Sensorium  Memory: Immediate Fair; Recent Fair; Remote Fair  Judgment: Fair  Insight: Fair   Art Therapist  Concentration: Fair  Attention Span: Fair  Recall: Fair  Fund of Knowledge: Fair  Language: Fair   Psychomotor Activity  Psychomotor Activity:No data  recorded Musculoskeletal: Strength & Muscle Tone: within normal limits Gait & Station: normal Assets  Assets: Manufacturing Systems Engineer; Desire for Improvement; Resilience; Social Support    Physical Exam: Physical Exam Vitals and nursing note reviewed.    ROS Blood pressure 131/63, pulse 69, temperature (!) 97.2 F (36.2 C), resp. rate 14, height 5' 6 (1.676 m), weight 80.7 kg, SpO2 99%. Body mass index is 28.73 kg/m.  Diagnosis: GAD with panic attacks Borderline personality   PLAN: Safety and Monitoring:  -- Voluntary admission to inpatient psychiatric unit for safety, stabilization and treatment  -- Daily contact with patient to assess and evaluate symptoms and progress in treatment  -- Patient's case to be discussed in multi-disciplinary team meeting  -- Observation Level : q15 minute checks  -- Vital signs:  q12 hours  -- Precautions: suicide, elopement, and assault -- Encouraged patient to participate in unit milieu and in scheduled group therapies  2. Psychiatric Treatment:  Scheduled Medications:  Effexor  Xr 150 mg daily increased to 225 Remeron  7.5 mg at bedtime Gabapentin  300mg  TID  Doxepin  50 mg at bedtime Synthroid  25 mcg home med added -- The risks/benefits/side-effects/alternatives to this medication were discussed in detail with the patient and time was given for questions. The patient consents to medication trial.  3. Medical Issues Being Addressed:     4. Discharge Planning:   -- Social work and case management to assist with discharge planning and identification of hospital follow-up needs prior to discharge  -- Estimated LOS: 3-4 days  Allyn Foil, MD 04/01/2024, 1:03 PM

## 2024-04-01 NOTE — Plan of Care (Signed)
  Problem: Self-Concept: Goal: Ability to identify factors that promote anxiety will improve Outcome: Not Progressing Goal: Level of anxiety will decrease Outcome: Not Progressing Goal: Ability to modify response to factors that promote anxiety will improve Outcome: Not Progressing

## 2024-04-01 NOTE — Group Note (Signed)
 Date:  04/01/2024 Time:  4:15 PM  Group Topic/Focus:  Fresh air therapy with music and conversation    Participation Level:  Active  Participation Quality:  Appropriate  Affect:  Appropriate  Cognitive:  Appropriate  Insight: Appropriate  Engagement in Group:  Engaged  Modes of Intervention:  Socialization  Additional Comments:  none  [name] Bias 04/01/2024, 4:15 PM

## 2024-04-01 NOTE — Progress Notes (Signed)
 SI/HI/AVH: denies  Behavior/Mood:  restless and anxious/anxious  Interaction/Group attendance: assertive/ 3 of 3 groups   Medication/PRNs: compliant/ none    Pain: denies  Other: na

## 2024-04-01 NOTE — BHH Counselor (Signed)
 CSW contacted pt's partner Dallas Speed, [phone].   Provider gave clinical updates and recommendations to pt's partner.   CSW emphasized the importance of following up with PHP to help with pt's anxiety.   Pt's partner verbalized understanding.   Pt partner agreeable to assist with transportation and to pt's discharge on Friday.   Lum Croft, MSW, [place] 04/01/2024 3:34 PM

## 2024-04-01 NOTE — Group Note (Signed)
 Date:  04/01/2024 Time:  10:25 AM  Group Topic/Focus:  Meditation therapy    Participation Level:  Active  Participation Quality:  Appropriate  Affect:  Appropriate  Cognitive:  Appropriate  Insight: Appropriate  Engagement in Group:  Engaged  Modes of Intervention:  Activity  Additional Comments:  none  [name] Bias 04/01/2024, 10:25 AM

## 2024-04-02 ENCOUNTER — Other Ambulatory Visit: Payer: Self-pay

## 2024-04-02 MED ORDER — VENLAFAXINE HCL ER 75 MG PO CP24
225.0000 mg | ORAL_CAPSULE | Freq: Every day | ORAL | 0 refills | Status: AC
Start: 2024-04-03 — End: ?
  Filled 2024-04-02: qty 90, 30d supply, fill #0

## 2024-04-02 MED ORDER — DOXEPIN HCL 50 MG PO CAPS
50.0000 mg | ORAL_CAPSULE | Freq: Every day | ORAL | 0 refills | Status: AC
  Filled 2024-04-02: qty 30, 30d supply, fill #0

## 2024-04-02 MED ORDER — PROPRANOLOL HCL 20 MG PO TABS
20.0000 mg | ORAL_TABLET | Freq: Two times a day (BID) | ORAL | 0 refills | Status: DC
  Filled 2024-04-02: qty 60, 30d supply, fill #0

## 2024-04-02 MED ORDER — GABAPENTIN 300 MG PO CAPS
300.0000 mg | ORAL_CAPSULE | Freq: Three times a day (TID) | ORAL | 0 refills | Status: AC
  Filled 2024-04-02: qty 90, 30d supply, fill #0

## 2024-04-02 MED ORDER — MIRTAZAPINE 7.5 MG PO TABS
7.5000 mg | ORAL_TABLET | Freq: Every day | ORAL | 0 refills | Status: AC
  Filled 2024-04-02: qty 30, 30d supply, fill #0

## 2024-04-02 NOTE — Progress Notes (Addendum)
   04/02/24 0546  Psych Admission Type (Psych Patients Only)  Admission Status Voluntary  Psychosocial Assessment  Patient Complaints Anxiety;Worrying  Eye Contact Intense  Facial Expression Anxious  Affect Anxious  Speech Pressured  Interaction Assertive  Motor Activity Restless  Appearance/Hygiene Unremarkable  Behavior Characteristics Cooperative;Anxious;Restless  Mood Anxious  Thought Process  Coherency WDL  Content WDL  Delusions None reported or observed  Perception WDL  Hallucination None reported or observed  Judgment Impaired  Confusion None  Danger to Self  Current suicidal ideation? Denies  Danger to Others  Danger to Others None reported or observed     Patient was up and visible on the unit at the start of the shift, appropriately socializing with peers in the milieu. Appetite is fair. Easily compliant with scheduled medications. Visited with her spouse this evening. Slept throughout the night, woke up early complained of anxiety offered PRNs to assist, zyprexa  5mg  and vistaril  25mg  p.o.. Patient returned to her room to rest. No further complaints voiced at this time. All orders maintained as written.    Estimated Sleeping Duration (Last 24 Hours): 6.50-8.00 hours (Due to Daylight Saving Time, the durations displayed may not accurately represent documentation during the time change interval)

## 2024-04-02 NOTE — BHH Suicide Risk Assessment (Signed)
 [facility] Discharge Suicide Risk Assessment   Principal Problem: Bipolar affective disorder [facility]) Discharge Diagnoses: Active Problems:   GAD (generalized anxiety disorder)   Total Time spent with patient: 30 minutes  Musculoskeletal: Strength & Muscle Tone: within normal limits Gait & Station: normal Patient leans: N/A  Psychiatric Specialty Exam  Presentation  General Appearance:  Appropriate for Environment  Eye Contact: Fair  Speech: Clear and Coherent  Speech Volume: Normal  Handedness: Right   Mood and Affect  Mood: Anxious  Duration of Depression Symptoms: No data recorded Affect: Congruent   Thought Process  Thought Processes: Coherent  Descriptions of Associations:Intact  Orientation:Full (Time, Place and Person)  Thought Content:Illogical  History of Schizophrenia/Schizoaffective disorder:No data recorded Duration of Psychotic Symptoms:No data recorded Hallucinations:Hallucinations: None  Ideas of Reference:None  Suicidal Thoughts:Suicidal Thoughts: No  Homicidal Thoughts:Homicidal Thoughts: No   Sensorium  Memory: Immediate Fair; Recent Fair  Judgment: -- (chronically limited)  Insight: -- (limited)   Executive Functions  Concentration: Fair  Attention Span: Fair  Recall: [employer] of Knowledge: Fair  Language: Fair   Psychomotor Activity  Psychomotor Activity: Psychomotor Activity: Normal   Assets  Assets: Manufacturing Systems Engineer; Desire for Improvement; Housing; Resilience; Social Support   Sleep  Sleep: Sleep: Fair  Estimated Sleeping Duration (Last 24 Hours): 7.00-8.50 hours (Due to Daylight Saving Time, the durations displayed may not accurately represent documentation during the time change interval)  Physical Exam: Physical Exam Vitals and nursing note reviewed.    ROS Blood pressure (!) 124/43, pulse 68, temperature 97.8 F (36.6 C), resp. rate 16, height 5' 6 (1.676 m), weight 80.7 kg,  SpO2 99%. Body mass index is 28.73 kg/m.  Mental Status Per Nursing Assessment::   On Admission:  NA  Demographic Factors:  Caucasian  Loss Factors: Decrease in vocational status  Historical Factors: Impulsivity  Risk Reduction Factors:   Living with another person, especially a relative, Positive social support, Positive therapeutic relationship, and Positive coping skills or problem solving skills  Continued Clinical Symptoms:  Severe Anxiety and/or Agitation  Cognitive Features That Contribute To Risk:  None    Suicide Risk:  Minimal: No identifiable suicidal ideation.  Patients presenting with no risk factors but with morbid ruminations; may be classified as minimal risk based on the severity of the depressive symptoms    Plan Of Care/Follow-up recommendations:  Activity:  as tolerated  Allyn Foil, MD 04/02/2024, 12:07 PM

## 2024-04-02 NOTE — Plan of Care (Signed)
   Problem: Coping: Goal: Ability to identify and develop effective coping behavior will improve Outcome: Progressing   Problem: Self-Concept: Goal: Level of anxiety will decrease Outcome: Progressing

## 2024-04-02 NOTE — Group Note (Signed)
 Date:  04/02/2024 Time:  10:56 AM  Group Topic/Focus:  Healthy Communication:   The focus of this group is to discuss communication, barriers to communication, as well as healthy ways to communicate with others.    Participation Level:  Active  Participation Quality:  Appropriate  Affect:  Appropriate  Cognitive:  Appropriate  Insight: Appropriate  Engagement in Group:  Engaged  Modes of Intervention:  Discussion   Dawn Kelley 04/02/2024, 10:56 AM

## 2024-04-02 NOTE — Progress Notes (Signed)
 SI/HI/AVH: denies all  Behavior/Mood: restless and anxious/anxious   Interaction/Group attendance: assertive/ 3 of 4 groups   Medication/PRNs: compliant/denies   Pain: denies  Other: pt will be discharging home tomorrow 11.21.25

## 2024-04-02 NOTE — Group Note (Signed)
 Recreation Therapy Group Note   Group Topic:Health and Wellness  Group Date: 04/02/2024 Start Time: 1400 End Time: 1450 Facilitators: Celestia Jeoffrey BRAVO, LRT, CTRS Location: Courtyard  Group Description: Outdoor Recreation. Patients had the option to play corn hole, ring toss, UNO, or listening to music while outside in the courtyard getting fresh air and sunlight. Patients helped water  and prune the raised garden beds. LRT and patients discussed things that they enjoy doing in their free time outside of the hospital. LRT encouraged patients to drink water  after being active and getting their heart rate up.   Goal Area(s) Addressed: Patient will identify leisure interests.  Patient will practice healthy decision making. Patient will engage in recreation activity.   Affect/Mood: Full range   Participation Level: Minimal    Clinical Observations/Individualized Feedback: Dawn Kelley was present in group. Pt was tearful one minute and signing the next. Pt shared that she is going home tomorrow.   Plan: Continue to engage patient in RT group sessions 2-3x/week.   Jeoffrey BRAVO Celestia, LRT, CTRS 04/02/2024 4:44 PM

## 2024-04-02 NOTE — Progress Notes (Signed)
 [facility] MD Progress Note  04/02/2024 12:08 PM Dawn Kelley  MRN:  [id number]  The patient is a 63 year old female brought in by EMS for severe insomnia and worsening anxiety. EMS reports multiple recent calls for similar complaints. The patient states she discontinued her psychiatric medications "for months" due to difficulty finding an outpatient provider. Since stopping medications, she reports debilitating anxiety, fearfulness, poor appetite, and inability to perform ADLs. Her boyfriend notes a decline over the past 3-4 months, including poor hygiene. Patient is admitted to [facility] unit with Q15 min safety monitoring. Multidisciplinary team approach is offered. Medication management; group/milieu therapy is offered.  Collateral:11/19: Patient and the team social worker called the patient's husband and updated about patient's need to follow-up with PHP programs and needing intensive psychotherapy to help with the anxiety.  Subjective:  Chart reviewed, case discussed in multidisciplinary meeting, patient seen during rounds.   Patient is noted to be resting in bed.  She remains discharge focused and continues to request for discharge home.  Patient remains having multiple somatic symptoms including feeling numbness in her hands or different bodily sensations throughout her body.  Provider encouraged patient to follow-up with her PCP regularly and also to take part in the partial hospitalization program that referral was sent to and she got accepted in [place].  Patient expressed her concern about transportation and she was informed to utilize her Medicaid transportation which she needs to call 24 hours before her appointments.  Patient was also updated on the team's discussion with her husband about patient need to follow-up outpatient mental health services as inpatient psychiatric hospitalizations are highly restrictive setting to treat her chronic anxiety.  Past Psychiatric History: see  h&P Family History:  Family History  Problem Relation Age of Onset   Depression Mother    Pneumonia Mother    Social History:  Social History   Substance and Sexual Activity  Alcohol Use Yes   Alcohol/week: 10.0 standard drinks of alcohol   Types: 10 Cans of beer per week   Comment: daily, last drank last night     Social History   Substance and Sexual Activity  Drug Use Yes   Types: Marijuana   Comment: daily    Social History   Socioeconomic History   Marital status: Married    Spouse name: Not on file   Number of children: Not on file   Years of education: Not on file   Highest education level: Not on file  Occupational History   Occupation: Disabled  Tobacco Use   Smoking status: Never    Passive exposure: Current   Smokeless tobacco: Never  Vaping Use   Vaping status: Never Used  Substance and Sexual Activity   Alcohol use: Yes    Alcohol/week: 10.0 standard drinks of alcohol    Types: 10 Cans of beer per week    Comment: daily, last drank last night   Drug use: Yes    Types: Marijuana    Comment: daily   Sexual activity: Not Currently    Birth control/protection: Post-menopausal  Other Topics Concern   Not on file  Social History Narrative   Pt lives in [place] with husband.  She is on disability.  Pt stated that she receives outpatient psychiatry services through [facility].  She does not receive outpatient therapy.   Social Drivers of Health   Financial Resource Strain: Low Risk  (10/23/2022)   Overall Financial Resource Strain (CARDIA)    Difficulty of Paying Living Expenses:  Not hard at all  Food Insecurity: No Food Insecurity (03/28/2024)   Hunger Vital Sign    Worried About Running Out of Food in the Last Year: Never true    Ran Out of Food in the Last Year: Never true  Transportation Needs: No Transportation Needs (03/28/2024)   PRAPARE - Administrator, Civil Service (Medical): No    Lack of Transportation  (Non-Medical): No  Physical Activity: Insufficiently Active (10/23/2022)   Exercise Vital Sign    Days of Exercise per Week: 2 days    Minutes of Exercise per Session: 10 min  Stress: No Stress Concern Present (10/23/2022)   [employer] of Occupational Health - Occupational Stress Questionnaire    Feeling of Stress : Not at all  Social Connections: Moderately Isolated (03/28/2024)   Social Connection and Isolation Panel    Frequency of Communication with Friends and Family: Three times a week    Frequency of Social Gatherings with Friends and Family: Three times a week    Attends Religious Services: Never    Active Member of Clubs or Organizations: No    Attends Banker Meetings: Never    Marital Status: Married   Past Medical History:  Past Medical History:  Diagnosis Date   Anxiety    Bipolar 1 disorder (HCC)    Depression    OCD (obsessive compulsive disorder)    PTSD (post-traumatic stress disorder)    S/P ECT (electroconvulsive therapy)     Past Surgical History:  Procedure Laterality Date   BIOPSY  02/03/2020   Procedure: BIOPSY;  Surgeon: Eartha Angelia Sieving, MD;  Location: [facility];  Service: Gastroenterology;;   COLONOSCOPY WITH PROPOFOL  N/A 02/03/2020   [name]: 3mm polyp in sigmoid colon  as well as hemorrhoids, histology consistent with tubular adenoma   ESOPHAGOGASTRODUODENOSCOPY (EGD) WITH PROPOFOL  N/A 02/03/2020   castaneda: small gastric polyps, sessile, and fundic gland, small bowel bx normal   INDUCED ABORTION N/A 1980   POLYPECTOMY  02/03/2020   Procedure: POLYPECTOMY;  Surgeon: Eartha Angelia, Sieving, MD;  Location: [facility];  Service: Gastroenterology;;   VEIN SURGERY      Current Medications: Current Facility-Administered Medications  Medication Dose Route Frequency Provider Last Rate Last Admin   alum & mag hydroxide-simeth (MAALOX/MYLANTA) 200-200-20 MG/5ML suspension 30 mL  30 mL Oral Q4H PRN Mills, Shnese E,  NP       antiseptic oral rinse (BIOTENE) solution 15 mL  15 mL Mouth Rinse PRN Moishe Burton E, NP       doxepin  (SINEQUAN ) capsule 50 mg  50 mg Oral QHS Emmalie Haigh, MD   50 mg at 04/01/24 2118   gabapentin  (NEURONTIN ) capsule 300 mg  300 mg Oral TID Madaram, Kondal R, MD   300 mg at 04/02/24 0950   hydrOXYzine  (ATARAX ) tablet 25 mg  25 mg Oral Q6H PRN Mills, Shnese E, NP   25 mg at 04/02/24 9385   levothyroxine  (SYNTHROID ) tablet 25 mcg  25 mcg Oral Q0600 Nehemiah Montee, MD   25 mcg at 04/02/24 0614   losartan  (COZAAR ) tablet 50 mg  50 mg Oral Daily Xandrea Clarey, MD   50 mg at 04/02/24 0950   magnesium  hydroxide (MILK OF MAGNESIA) suspension 30 mL  30 mL Oral Daily PRN Mills, Shnese E, NP       mirtazapine  (REMERON ) tablet 7.5 mg  7.5 mg Oral QHS Madaram, Kondal R, MD   7.5 mg at 04/01/24 2118   multivitamin  with minerals tablet 1 tablet  1 tablet Oral Daily Moishe Burton E, NP   1 tablet at 04/02/24 9049   OLANZapine  (ZYPREXA ) injection 5 mg  5 mg Intramuscular TID PRN Mills, Shnese E, NP       OLANZapine  zydis (ZYPREXA ) disintegrating tablet 5 mg  5 mg Oral TID PRN Mills, Shnese E, NP   5 mg at 04/02/24 0614   omega-3 acid ethyl esters (LOVAZA ) capsule 1 g  1 g Oral BID Mills, Shnese E, NP   1 g at 04/02/24 9049   propranolol  (INDERAL ) tablet 20 mg  20 mg Oral BID Raydell Maners, MD   20 mg at 04/02/24 9049   rosuvastatin  (CRESTOR ) tablet 20 mg  20 mg Oral Daily Mills, Shnese E, NP   20 mg at 04/02/24 9049   venlafaxine  XR (EFFEXOR -XR) 24 hr capsule 225 mg  225 mg Oral Q breakfast Fatimata Talsma, MD   225 mg at 04/02/24 9198    Lab Results:  No results found for this or any previous visit (from the past 48 hours).   Blood Alcohol level:  Lab Results  Component Value Date   [facility] <15 03/28/2024   ETH <15 11/21/2023    Metabolic Disorder Labs: Lab Results  Component Value Date   HGBA1C 5.7 (H) 11/27/2022   No results found for: PROLACTIN Lab Results  Component Value  Date   CHOL 151 03/31/2024   TRIG 110 03/31/2024   HDL 51 03/31/2024   CHOLHDL 3.0 03/31/2024   VLDL 22 03/31/2024   LDLCALC 78 03/31/2024    Physical Findings: AIMS:  , ,  ,  ,    CIWA:    COWS:      Psychiatric Specialty Exam:  Presentation  General Appearance:  Appropriate for Environment  Eye Contact: Fair  Speech: Clear and Coherent  Speech Volume: Normal    Mood and Affect  Mood: Anxious  Affect: Congruent   Thought Process  Thought Processes: Coherent  Orientation:Full (Time, Place and Person)  Thought Content:Illogical  Hallucinations: denies Ideas of Reference:None  Suicidal Thoughts:denies Homicidal Thoughts:denies  Sensorium  Memory: Immediate Fair; Recent Fair  Judgment: -- (chronically limited)  Insight: -- (limited)   Art Therapist  Concentration: Fair  Attention Span: Fair  Recall: Dotti Abe of Knowledge: Fair  Language: Fair   Psychomotor Activity  Psychomotor Activity:Psychomotor Activity: Normal  Musculoskeletal: Strength & Muscle Tone: within normal limits Gait & Station: normal Assets  Assets: Manufacturing Systems Engineer; Desire for Improvement; Housing; Resilience; Social Support    Physical Exam: Physical Exam Vitals and nursing note reviewed.    ROS Blood pressure (!) 124/43, pulse 68, temperature 97.8 F (36.6 C), resp. rate 16, height 5' 6 (1.676 m), weight 80.7 kg, SpO2 99%. Body mass index is 28.73 kg/m.  Diagnosis: GAD with panic attacks Borderline personality   PLAN: Safety and Monitoring:  -- Voluntary admission to inpatient psychiatric unit for safety, stabilization and treatment  -- Daily contact with patient to assess and evaluate symptoms and progress in treatment  -- Patient's case to be discussed in multi-disciplinary team meeting  -- Observation Level : q15 minute checks  -- Vital signs:  q12 hours  -- Precautions: suicide, elopement, and assault -- Encouraged  patient to participate in unit milieu and in scheduled group therapies  2. Psychiatric Treatment:  Scheduled Medications:  Effexor  Xr 150 mg daily increased to 225 Remeron  7.5 mg at bedtime Gabapentin  300mg  TID  Doxepin 50 mg at bedtime Synthroid  25  mcg home med added -- The risks/benefits/side-effects/alternatives to this medication were discussed in detail with the patient and time was given for questions. The patient consents to medication trial.  3. Medical Issues Being Addressed:     4. Discharge Planning:   -- Social work and case management to assist with discharge planning and identification of hospital follow-up needs prior to discharge  -- Estimated LOS: 3-4 days  Allyn Foil, MD 04/02/2024, 12:08 PM

## 2024-04-02 NOTE — Plan of Care (Signed)
   Problem: Activity: Goal: Interest or engagement in activities will improve Outcome: Progressing Goal: Sleeping patterns will improve Outcome: Progressing

## 2024-04-02 NOTE — Group Note (Signed)
 LCSW Group Therapy Note  Group Date: 04/02/2024 Start Time: 1315 End Time: 1345   Type of Therapy and Topic:  Group Therapy - Healthy vs Unhealthy Coping Skills  Participation Level:  Did Not Attend   Description of Group The focus of this group was to determine what unhealthy coping techniques typically are used by group members and what healthy coping techniques would be helpful in coping with various problems. Patients were guided in becoming aware of the differences between healthy and unhealthy coping techniques. Patients were asked to identify 2-3 healthy coping skills they would like to learn to use more effectively.  Therapeutic Goals Patients learned that coping is what human beings do all day long to deal with various situations in their lives Patients defined and discussed healthy vs unhealthy coping techniques Patients identified their preferred coping techniques and identified whether these were healthy or unhealthy Patients determined 2-3 healthy coping skills they would like to become more familiar with and use more often. Patients provided support and ideas to each other   Summary of Patient Progress:  X   Therapeutic Modalities Cognitive Behavioral Therapy Motivational Interviewing  Dawn Kelley, LCSWA 04/02/2024  1:40 PM

## 2024-04-02 NOTE — Group Note (Signed)
 Date:  04/02/2024 Time:  4:19 PM  Group Topic/Focus:  Developing a Wellness Toolbox:   The focus of this group is to help patients develop a wellness toolbox with skills and strategies to promote recovery upon discharge. Overcoming Stress:   The focus of this group is to define stress and help patients assess their triggers.    Participation Level:  Active  Participation Quality:  Attentive  Affect:  Appropriate  Cognitive:  Alert  Insight: Improving  Engagement in Group:  Engaged  Modes of Intervention:  Discussion  Additional Comments:    Dawn Kelley 04/02/2024, 4:19 PM

## 2024-04-03 NOTE — Discharge Summary (Signed)
 Physician Discharge Summary Note  Patient:  Dawn Kelley is an 63 y.o., female MRN:  [id number] DOB:  07/14/1960 Patient phone:  [phone] (home)  Patient address:   [address] [place] [address],   Total time spent: 40 min Date of Admission:  03/28/2024 Date of Discharge: 04/03/24  Reason for Admission:  The patient is a 63 year old female brought in by EMS for severe insomnia and worsening anxiety. EMS reports multiple recent calls for similar complaints. The patient states she discontinued her psychiatric medications "for months" due to difficulty finding an outpatient provider. Since stopping medications, she reports debilitating anxiety, fearfulness, poor appetite, and inability to perform ADLs. Her boyfriend notes a decline over the past 3-4 months, including poor hygiene. Patient is admitted to [facility] unit with Q15 min safety monitoring. Multidisciplinary team approach is offered. Medication management; group/milieu therapy is offered.   Principal Problem: Bipolar affective disorder [facility]) Discharge Diagnoses: Active Problems:   GAD (generalized anxiety disorder)   Past Psychiatric History: see h&p  Family Psychiatric  History: see h&p Social History:  Social History   Substance and Sexual Activity  Alcohol Use Yes   Alcohol/week: 10.0 standard drinks of alcohol   Types: 10 Cans of beer per week   Comment: daily, last drank last night     Social History   Substance and Sexual Activity  Drug Use Yes   Types: Marijuana   Comment: daily    Social History   Socioeconomic History   Marital status: Married    Spouse name: Not on file   Number of children: Not on file   Years of education: Not on file   Highest education level: Not on file  Occupational History   Occupation: Disabled  Tobacco Use   Smoking status: Never    Passive exposure: Current   Smokeless tobacco: Never  Vaping Use   Vaping status: Never Used  Substance and Sexual Activity    Alcohol use: Yes    Alcohol/week: 10.0 standard drinks of alcohol    Types: 10 Cans of beer per week    Comment: daily, last drank last night   Drug use: Yes    Types: Marijuana    Comment: daily   Sexual activity: Not Currently    Birth control/protection: Post-menopausal  Other Topics Concern   Not on file  Social History Narrative   Pt lives in [place] with husband.  She is on disability.  Pt stated that she receives outpatient psychiatry services through [facility].  She does not receive outpatient therapy.   Social Drivers of Corporate Investment Banker Strain: Low Risk  (10/23/2022)   Overall Financial Resource Strain (CARDIA)    Difficulty of Paying Living Expenses: Not hard at all  Food Insecurity: No Food Insecurity (03/28/2024)   Hunger Vital Sign    Worried About Running Out of Food in the Last Year: Never true    Ran Out of Food in the Last Year: Never true  Transportation Needs: No Transportation Needs (03/28/2024)   PRAPARE - Administrator, Civil Service (Medical): No    Lack of Transportation (Non-Medical): No  Physical Activity: Insufficiently Active (10/23/2022)   Exercise Vital Sign    Days of Exercise per Week: 2 days    Minutes of Exercise per Session: 10 min  Stress: No Stress Concern Present (10/23/2022)   [employer] of Occupational Health - Occupational Stress Questionnaire    Feeling of Stress : Not at all  Social  Connections: Moderately Isolated (03/28/2024)   Social Connection and Isolation Panel    Frequency of Communication with Friends and Family: Three times a week    Frequency of Social Gatherings with Friends and Family: Three times a week    Attends Religious Services: Never    Active Member of Clubs or Organizations: No    Attends Banker Meetings: Never    Marital Status: Married   Past Medical History:  Past Medical History:  Diagnosis Date   Anxiety    Bipolar 1 disorder (HCC)    Depression     OCD (obsessive compulsive disorder)    PTSD (post-traumatic stress disorder)    S/P ECT (electroconvulsive therapy)     Past Surgical History:  Procedure Laterality Date   BIOPSY  02/03/2020   Procedure: BIOPSY;  Surgeon: Eartha Angelia Sieving, MD;  Location: [facility];  Service: Gastroenterology;;   COLONOSCOPY WITH PROPOFOL  N/A 02/03/2020   Eartha: 3mm polyp in sigmoid colon  as well as hemorrhoids, histology consistent with tubular adenoma   ESOPHAGOGASTRODUODENOSCOPY (EGD) WITH PROPOFOL  N/A 02/03/2020   castaneda: small gastric polyps, sessile, and fundic gland, small bowel bx normal   INDUCED ABORTION N/A 1980   POLYPECTOMY  02/03/2020   Procedure: POLYPECTOMY;  Surgeon: Eartha Angelia Sieving, MD;  Location: [facility];  Service: Gastroenterology;;   VEIN SURGERY     Family History:  Family History  Problem Relation Age of Onset   Depression Mother    Pneumonia Mother     Hospital Course:  The patient is a 63 year old female brought in by EMS for severe insomnia and worsening anxiety. EMS reports multiple recent calls for similar complaints. The patient states she discontinued her psychiatric medications "for months" due to difficulty finding an outpatient provider. Since stopping medications, she reports debilitating anxiety, fearfulness, poor appetite, and inability to perform ADLs. Her boyfriend notes a decline over the past 3-4 months, including poor hygiene. Patient is admitted to [facility] unit with Q15 min safety monitoring. Multidisciplinary team approach is offered. Medication management; group/milieu therapy is offered.   On admission,Patient was started on   Effexor  Xr 150 mg daily increased to 225; Remeron  7.5 mg at bedtime; Gabapentin  300mg  TID;  Doxepin  50 mg at bedtime.  Patient tolerated medications fairly well.  Patient has an extensive history of psychosomatic illness where she remains fixated on different somatic symptoms claiming her anxiety is  not controlled.  She consistently denied SI/HI/plan and denies hallucinations.  Patient is noted to be in a happy mood, attending groups.  Treatment team had extensive discussions with patient and patient's husband that she needs intensive outpatient therapy programs including PHP programs.  Detailed risk assessment is complete based on clinical exam and individual risk factors and acute suicide risk is low and acute violence risk is low.    On the day of discharge, patient denies SI/HI/plan and denies hallucinations.  Patient remains future oriented and is willing to participate in outpatient mental health services.  Currently, all modifiable risk of harm to self/harm to others have been addressed and patient is no longer appropriate for the acute inpatient setting and is able to continue treatment for mental health needs in the community with the supports as indicated below.  Patient is educated and verbalized understanding of discharge plan of care including medications, follow-up appointments, mental health resources and further crisis services in the community.  He is instructed to call 911 or present to the nearest emergency room should he  experience any decompensation in mood, disturbance of bowel or return of suicidal/homicidal ideations.  Patient verbalizes understanding of this education and agrees to this plan of care  Physical Findings: AIMS:  , ,  ,  ,    CIWA:    COWS:      Psychiatric Specialty Exam:  Presentation  General Appearance:  Appropriate for Environment  Eye Contact: Fair  Speech: Clear and Coherent  Speech Volume: Normal    Mood and Affect  Mood: Anxious  Affect: Congruent   Thought Process  Thought Processes: Coherent  Descriptions of Associations:Intact  Orientation:Full (Time, Place and Person)  Thought Content:Illogical  Hallucinations:Hallucinations: None  Ideas of Reference:None  Suicidal Thoughts:Suicidal Thoughts: No  Homicidal  Thoughts:Homicidal Thoughts: No   Sensorium  Memory: Immediate Fair; Recent Fair  Judgment: -- (chronically limited)  Insight: -- (limited)   Executive Functions  Concentration: Fair  Attention Span: Fair  Recall: [employer] of Knowledge: Fair  Language: Fair   Psychomotor Activity  Psychomotor Activity: Psychomotor Activity: Normal  Musculoskeletal: Strength & Muscle Tone: within normal limits Gait & Station: normal Assets  Assets: Manufacturing Systems Engineer; Desire for Improvement; Housing; Resilience; Social Support   Sleep  Sleep: Sleep: Fair    Physical Exam: Physical Exam Vitals and nursing note reviewed.    ROS Blood pressure 128/78, pulse 66, temperature (!) 97.4 F (36.3 C), resp. rate 18, height 5' 6 (1.676 m), weight 80.7 kg, SpO2 100%. Body mass index is 28.73 kg/m.   Social History   Tobacco Use  Smoking Status Never   Passive exposure: Current  Smokeless Tobacco Never   Tobacco Cessation:  N/A, patient does not currently use tobacco products   Blood Alcohol level:  Lab Results  Component Value Date   [facility] <15 03/28/2024   ETH <15 11/21/2023    Metabolic Disorder Labs:  Lab Results  Component Value Date   HGBA1C 5.7 (H) 11/27/2022   No results found for: PROLACTIN Lab Results  Component Value Date   CHOL 151 03/31/2024   TRIG 110 03/31/2024   HDL 51 03/31/2024   CHOLHDL 3.0 03/31/2024   VLDL 22 03/31/2024   LDLCALC 78 03/31/2024    See Psychiatric Specialty Exam and Suicide Risk Assessment completed by Attending Physician prior to discharge.  Discharge destination:  Home  Is patient on multiple antipsychotic therapies at discharge:  No   Has Patient had three or more failed trials of antipsychotic monotherapy by history:  No  Recommended Plan for Multiple Antipsychotic Therapies: NA  Discharge Instructions     Diet - low sodium heart healthy   Complete by: As directed    Increase activity slowly    Complete by: As directed       Allergies as of 04/03/2024       Reactions   Seroquel  [quetiapine  Fumarate] Anaphylaxis, Shortness Of Breath   Trazodone Hives   Escitalopram Nausea And Vomiting   Tolerated citalopram with out difficulty for many years.        Medication List     STOP taking these medications    busPIRone  15 MG tablet Commonly known as: BUSPAR    cloNIDine  0.1 MG tablet Commonly known as: CATAPRES    FLUoxetine  20 MG capsule Commonly known as: PROZAC    hydrOXYzine  10 MG tablet Commonly known as: ATARAX    lamoTRIgine  25 MG tablet Commonly known as: LAMICTAL    LORazepam  2 MG tablet Commonly known as: ATIVAN    multivitamin with minerals Tabs tablet   QUEtiapine  50 MG  tablet Commonly known as: SEROquel    Vraylar 1.5 MG capsule Generic drug: cariprazine       TAKE these medications      Indication  antiseptic oral rinse Liqd 15 mLs by Mouth Rinse route as needed for dry mouth.    doxepin  50 MG capsule Commonly known as: SINEQUAN  Take 1 capsule (50 mg total) by mouth at bedtime.  Indication: Major Depressive Disorder   gabapentin  300 MG capsule Commonly known as: NEURONTIN  Take 1 capsule (300 mg total) by mouth 3 (three) times daily.  Indication: Generalized Anxiety Disorder   levothyroxine  25 MCG tablet Commonly known as: SYNTHROID  Take 1 tablet (25 mcg total) by mouth daily at 6 (six) AM. What changed: when to take this    losartan  50 MG tablet Commonly known as: COZAAR  Take 50 mg by mouth daily.    mirtazapine  7.5 MG tablet Commonly known as: REMERON  Take 1 tablet (7.5 mg total) by mouth at bedtime. What changed:  medication strength how much to take  Indication: Major Depressive Disorder   omega-3 acid ethyl esters 1 g capsule Commonly known as: LOVAZA  Take 1 capsule (1 g total) by mouth 2 (two) times daily.    propranolol  20 MG tablet Commonly known as: INDERAL  Take 1 tablet (20 mg total) by mouth 2 (two) times  daily.    rosuvastatin  40 MG tablet Commonly known as: CRESTOR  Take 40 mg by mouth daily.    venlafaxine  XR 75 MG 24 hr capsule Commonly known as: EFFEXOR -XR Take 3 capsules (225 mg total) by mouth daily with breakfast. What changed:  medication strength how much to take when to take this  Indication: Generalized Anxiety Disorder, Posttraumatic Stress Disorder        Follow-up Information     [facility] Follow up on 04/06/2024.   Why: You are scheduled for an assessment for the Partial Hospitalization Program (PHP) on Monday, 11/24 at 1:00pm. PHP is an in-person group therapy that meets Monday-Thursday from 9a-2p and Fridays from 9a-1p for an average of 2-3 weeks. This appointment will last approximately 1-1.5 hours and will be virtual via MyChart. If you need to cancel or reschedule, please call [phone] and leave a voicemail with your name, date of birth, and phone number. Contact information: [address] [place] [address] [phone]                 Follow-up recommendations:  Activity:  as tolerated    Signed: Makhiya Coburn, MD 04/03/2024, 3:49 PM

## 2024-04-03 NOTE — Plan of Care (Signed)
  Problem: Education: Goal: Ability to state activities that reduce stress will improve Outcome: Progressing   Problem: Coping: Goal: Ability to identify and develop effective coping behavior will improve Outcome: Progressing   Problem: Self-Concept: Goal: Ability to identify factors that promote anxiety will improve Outcome: Progressing Goal: Level of anxiety will decrease Outcome: Progressing Goal: Ability to modify response to factors that promote anxiety will improve Outcome: Progressing   Problem: Education: Goal: Knowledge of [facility] Education information/materials will improve Outcome: Progressing Goal: Emotional status will improve Outcome: Progressing Goal: Mental status will improve Outcome: Progressing Goal: Verbalization of understanding the information provided will improve Outcome: Progressing   Problem: Activity: Goal: Interest or engagement in activities will improve Outcome: Progressing Goal: Sleeping patterns will improve Outcome: Progressing   Problem: Coping: Goal: Ability to verbalize frustrations and anger appropriately will improve Outcome: Progressing Goal: Ability to demonstrate self-control will improve Outcome: Progressing   Problem: Health Behavior/Discharge Planning: Goal: Identification of resources available to assist in meeting health care needs will improve Outcome: Progressing Goal: Compliance with treatment plan for underlying cause of condition will improve Outcome: Progressing   Problem: Physical Regulation: Goal: Ability to maintain clinical measurements within normal limits will improve Outcome: Progressing   Problem: Safety: Goal: Periods of time without injury will increase Outcome: Progressing   Problem: Education: Goal: Knowledge of General Education information will improve Description: Including pain rating scale, medication(s)/side effects and non-pharmacologic comfort measures Outcome: Progressing    Problem: Health Behavior/Discharge Planning: Goal: Ability to manage health-related needs will improve Outcome: Progressing   Problem: Clinical Measurements: Goal: Ability to maintain clinical measurements within normal limits will improve Outcome: Progressing Goal: Will remain free from infection Outcome: Progressing Goal: Diagnostic test results will improve Outcome: Progressing Goal: Respiratory complications will improve Outcome: Progressing Goal: Cardiovascular complication will be avoided Outcome: Progressing   Problem: Activity: Goal: Risk for activity intolerance will decrease Outcome: Progressing   Problem: Nutrition: Goal: Adequate nutrition will be maintained Outcome: Progressing   Problem: Coping: Goal: Level of anxiety will decrease Outcome: Progressing   Problem: Elimination: Goal: Will not experience complications related to bowel motility Outcome: Progressing Goal: Will not experience complications related to urinary retention Outcome: Progressing   Problem: Pain Managment: Goal: General experience of comfort will improve and/or be controlled Outcome: Progressing   Problem: Safety: Goal: Ability to remain free from injury will improve Outcome: Progressing   Problem: Skin Integrity: Goal: Risk for impaired skin integrity will decrease Outcome: Progressing

## 2024-04-03 NOTE — [facility] Suicide Risk Assessment (Signed)
 [facility] INPATIENT:  Family/Significant Other Suicide Prevention Education  Suicide Prevention Education:  Education Completed; Dallas Speed, partner, [phone] ,  (name of family member/significant other) has been identified by the patient as the family member/significant other with whom the patient will be residing, and identified as the person(s) who will aid the patient in the event of a mental health crisis (suicidal ideations/suicide attempt).  With written consent from the patient, the family member/significant other has been provided the following suicide prevention education, prior to the and/or following the discharge of the patient.  The suicide prevention education provided includes the following: Suicide risk factors Suicide prevention and interventions National Suicide Hotline telephone number [facility] assessment telephone number [facility] Emergency Assistance 911 [facility] and/or Residential Mobile Crisis Unit telephone number  Request made of family/significant other to: Remove weapons (e.g., guns, rifles, knives), all items previously/currently identified as safety concern.   Remove drugs/medications (over-the-counter, prescriptions, illicit drugs), all items previously/currently identified as a safety concern.  The family member/significant other verbalizes understanding of the suicide prevention education information provided.  The family member/significant other agrees to remove the items of safety concern listed above.  Dawn Kelley 04/03/2024, 8:31 AM

## 2024-04-03 NOTE — Progress Notes (Signed)
  [facility] Adult Case Management Discharge Plan :  Will you be returning to the same living situation after discharge:  Yes,  pt will return home  At discharge, do you have transportation home?: Yes,  pt's husband will pick her up  Do you have the ability to pay for your medications: Yes,   [employer] MEDICARE / DREMA DUAL COMPLETE  Release of information consent forms completed and in the chart;  Patient's signature needed at discharge.  Patient to Follow up at:  Follow-up Information     [facility] Follow up on 04/06/2024.   Why: You are scheduled for an assessment for the Partial Hospitalization Program (PHP) on Monday, 11/24 at 1:00pm. PHP is an in-person group therapy that meets Monday-Thursday from 9a-2p and Fridays from 9a-1p for an average of 2-3 weeks. This appointment will last approximately 1-1.5 hours and will be virtual via MyChart. If you need to cancel or reschedule, please call [phone] and leave a voicemail with your name, date of birth, and phone number. Contact information: [address] [place] [address] [phone]                 Next level of care provider has access to [facility] Link:no  Safety Planning and Suicide Prevention discussed: [place],  Dallas Speed, partner, [phone]     Has patient been referred to the Quitline?: Patient does not use tobacco/nicotine products  Patient has been referred for addiction treatment: No known substance use disorder.  [address] LCSWA 04/03/2024, 8:30 AM

## 2024-04-03 NOTE — Progress Notes (Signed)
 Mood/Behavior:  Anxious, fearful and intrusive. Pt requires to be redirected constantly. Discussed some coping skills and methods with pt and pt agreeable to use some coping skills when she feels anxious. Pt expressing fear and feeling like she is going to die frequently.   Psych assessment: Denies SI/HI and AVH.     Interaction / Group attendance:  Present in the milieu. Pt had to be redirected as she was heard stating I am dying and going to die to peers while in the dayroom. Pt encouraged to use coping skills and clinical research associate provided pt emotional support. Interacting with peers and staff.  Attended group.   Medication/ PRNs: Compliant with scheduled medications. Required PRNs Atarax  for anxiety x2  and decreased effect.     Pain: Denies   15 min checks in place for safety.

## 2024-04-03 NOTE — Group Note (Signed)
 Date:  04/03/2024 Time:  10:56 AM  Group Topic/Focus:  Personal Choices and Values:   The focus of this group is to help patients assess and explore the importance of values in their lives, how their values affect their decisions, how they express their values and what opposes their expression.    Participation Level:  Active  Participation Quality:  Appropriate  Affect:  Appropriate  Cognitive:  Appropriate  Insight: Appropriate  Engagement in Group:  Engaged  Modes of Intervention:  Discussion   Arland Nutting 04/03/2024, 10:56 AM

## 2024-04-03 NOTE — Progress Notes (Signed)
 Discharge Note:  Patient denies SI/HI/AVH at this time. Discharge instructions, AVS, and transition record gone over with patient. Patient agrees to comply with medication management, and outpatient therapy. Patient given medications from outpatient pharmacy. Patient belongings returned to patient. Patient questions and concerns addressed and answered. Patient ambulatory off unit. Patient discharged to home with spouse.

## 2024-04-04 ENCOUNTER — Encounter (HOSPITAL_COMMUNITY): Payer: Self-pay | Admitting: Emergency Medicine

## 2024-04-04 ENCOUNTER — Emergency Department (HOSPITAL_COMMUNITY)
Admission: EM | Admit: 2024-04-04 | Discharge: 2024-04-05 | Disposition: A | Discharge status: Home / Self Care | Attending: Emergency Medicine | Admitting: Emergency Medicine

## 2024-04-04 ENCOUNTER — Other Ambulatory Visit: Payer: Self-pay

## 2024-04-04 DIAGNOSIS — F411 Generalized anxiety disorder: Secondary | ICD-10-CM | POA: Diagnosis present

## 2024-04-04 DIAGNOSIS — G4700 Insomnia, unspecified: Secondary | ICD-10-CM | POA: Diagnosis present

## 2024-04-04 DIAGNOSIS — Z79899 Other long term (current) drug therapy: Secondary | ICD-10-CM | POA: Insufficient documentation

## 2024-04-04 DIAGNOSIS — Z7722 Contact with and (suspected) exposure to environmental tobacco smoke (acute) (chronic): Secondary | ICD-10-CM | POA: Diagnosis not present

## 2024-04-04 DIAGNOSIS — F419 Anxiety disorder, unspecified: Secondary | ICD-10-CM | POA: Diagnosis not present

## 2024-04-04 MED ORDER — LORAZEPAM 1 MG PO TABS
1.0000 mg | ORAL_TABLET | Freq: Once | ORAL | Status: DC
Start: 2024-04-04 — End: 2024-04-05
  Filled 2024-04-04: qty 1

## 2024-04-04 NOTE — ED Provider Notes (Signed)
 East  EMERGENCY DEPARTMENT AT [facility] Provider Note   CSN: [id number] Arrival date & time: 04/04/24  2259     Patient presents with: Mental Health Problem   Dawn Kelley is a 63 y.o. female.  {Add pertinent medical, surgical, social history, OB history to HPI:32947} HPI     This is a 63 year old female with a history of bipolar disorder and generalized anxiety disorder who presents with concerns regarding her medications.  She was just discharged from [facility] yesterday.  There was some change in her medications.  She states that the paperwork is very confusing and that she does not know what she is supposed to be taking.  However in the same statement she also notes that she has been taking medications as prescribed.  It seems that maybe she does not have her nonpsychiatric medications such as her thyroid  medication.  She reports that she is having increasing anxiety.  She lives with her boyfriend.  She states that she has chronic dry mouth and I just know something is wrong with me.  She was hospitalized from 11/15 until yesterday.  Based on notes she was cooperative and engaging in group therapy.  Plan for follow-up on Monday.  She denies any SI or HI.  She expresses generalized anxiety about her health and has innumerable somatic complaints.  Reports that she has had difficulty getting around ambulating at her baseline although she was noted to be standing at the nursing station when I arrived.  Prior to Admission medications   Medication Sig Start Date End Date Taking? Authorizing Provider  antiseptic oral rinse (BIOTENE) LIQD 15 mLs by Mouth Rinse route as needed for dry mouth. 10/31/23   Jadapalle, Sree, MD  doxepin  (SINEQUAN ) 50 MG capsule Take 1 capsule (50 mg total) by mouth at bedtime. 04/02/24   Donnelly Mellow, MD  gabapentin  (NEURONTIN ) 300 MG capsule Take 1 capsule (300 mg total) by mouth 3 (three) times daily. 04/02/24   Jadapalle, Sree, MD   levothyroxine  (SYNTHROID ) 25 MCG tablet Take 1 tablet (25 mcg total) by mouth daily at 6 (six) AM. Patient taking differently: Take 25 mcg by mouth daily before breakfast. 11/01/23   Donnelly Mellow, MD  losartan  (COZAAR ) 50 MG tablet Take 50 mg by mouth daily.    [provider]  mirtazapine  (REMERON ) 7.5 MG tablet Take 1 tablet (7.5 mg total) by mouth at bedtime. 04/02/24   Jadapalle, Sree, MD  omega-3 acid ethyl esters (LOVAZA ) 1 g capsule Take 1 capsule (1 g total) by mouth 2 (two) times daily. 11/01/23   Jadapalle, Sree, MD  propranolol  (INDERAL ) 20 MG tablet Take 1 tablet (20 mg total) by mouth 2 (two) times daily. 04/02/24   Jadapalle, Sree, MD  rosuvastatin  (CRESTOR ) 40 MG tablet Take 40 mg by mouth daily.    [provider]  venlafaxine  XR (EFFEXOR -XR) 75 MG 24 hr capsule Take 3 capsules (225 mg total) by mouth daily with breakfast. 04/03/24   Jadapalle, Sree, MD    Allergies: Seroquel  [quetiapine  fumarate], Trazodone, and Escitalopram    Review of Systems  Psychiatric/Behavioral:  Positive for sleep disturbance. Negative for suicidal ideas. The patient is nervous/anxious.   All other systems reviewed and are negative.   Updated Vital Signs BP 111/83   Pulse 83   Temp 98.5 F (36.9 C) (Oral)   Resp 18   Wt 81 kg   SpO2 98%   BMI 28.82 kg/m   Physical Exam Vitals and nursing note reviewed.  Constitutional:      Appearance: She is well-developed.  HENT:     Head: Normocephalic and atraumatic.     Mouth/Throat:     Mouth: Mucous membranes are moist.  Eyes:     Pupils: Pupils are equal, round, and reactive to light.  Cardiovascular:     Rate and Rhythm: Normal rate and regular rhythm.  Pulmonary:     Effort: Pulmonary effort is normal. No respiratory distress.  Abdominal:     Palpations: Abdomen is soft.  Musculoskeletal:     Cervical back: Neck supple.  Skin:    General: Skin is warm and dry.  Neurological:     Mental Status: She is alert and  oriented to person, place, and time.  Psychiatric:     Comments: Anxious appearing with pressured speech, labile and tearful     (all labs ordered are listed, but only abnormal results are displayed) Labs Reviewed  URINALYSIS, ROUTINE W REFLEX MICROSCOPIC  URINE DRUG SCREEN    EKG: None  Radiology: No results found.  {Document cardiac monitor, telemetry assessment procedure when appropriate:32947} Procedures   Medications Ordered in the ED  LORazepam  (ATIVAN ) tablet 1 mg (has no administration in time range)      {Click here for ABCD2, HEART and other calculators REFRESH Note before signing:1}                              Medical Decision Making Amount and/or Complexity of Data Reviewed Labs: ordered.  Risk Prescription drug management.   ***  {Document critical care time when appropriate  Document review of labs and clinical decision tools ie CHADS2VASC2, etc  Document your independent review of radiology images and any outside records  Document your discussion with family members, caretakers and with consultants  Document social determinants of health affecting pt's care  Document your decision making why or why not admission, treatments were needed:32947:::1}   Final diagnoses:  None    ED Discharge Orders     None

## 2024-04-04 NOTE — ED Notes (Signed)
 Patient given phone to call husband per request.

## 2024-04-04 NOTE — ED Triage Notes (Addendum)
 Patient BIB [facility] c/o psych eval.  Patient discharged from [facility] with medications upon discharge and patient reports that the instructions were so confusing, she hasn't taken any meds and feels like there's shock therapy in her head and poison in her blood.  Patient denies SI/HI.   105 HR 96% RA 140/78 CBG 112

## 2024-04-04 NOTE — ED Notes (Addendum)
 Patient demanding to see MD, stating this is an emergency, I need water  to live, [facility]'t [facility], I will die there. Patient admits to smoking marijuana since discharged from inpatient psych facility.

## 2024-04-05 ENCOUNTER — Emergency Department (HOSPITAL_COMMUNITY)
Admission: EM | Admit: 2024-04-05 | Discharge: 2024-04-06 | Disposition: A | Discharge status: Home / Self Care | Source: Home / Self Care | Attending: Emergency Medicine | Admitting: Emergency Medicine

## 2024-04-05 ENCOUNTER — Other Ambulatory Visit: Payer: Self-pay

## 2024-04-05 ENCOUNTER — Encounter (HOSPITAL_COMMUNITY): Payer: Self-pay | Admitting: Emergency Medicine

## 2024-04-05 DIAGNOSIS — Z7722 Contact with and (suspected) exposure to environmental tobacco smoke (acute) (chronic): Secondary | ICD-10-CM | POA: Insufficient documentation

## 2024-04-05 DIAGNOSIS — G4700 Insomnia, unspecified: Secondary | ICD-10-CM | POA: Insufficient documentation

## 2024-04-05 DIAGNOSIS — F419 Anxiety disorder, unspecified: Secondary | ICD-10-CM | POA: Insufficient documentation

## 2024-04-05 LAB — URINALYSIS, ROUTINE W REFLEX MICROSCOPIC
Bilirubin Urine: NEGATIVE
Glucose, UA: NEGATIVE mg/dL
Ketones, ur: NEGATIVE mg/dL
Leukocytes,Ua: NEGATIVE
Nitrite: NEGATIVE
Protein, ur: NEGATIVE mg/dL
Specific Gravity, Urine: 1.003 — ABNORMAL LOW (ref 1.005–1.030)
pH: 6 (ref 5.0–8.0)

## 2024-04-05 LAB — URINE DRUG SCREEN
Amphetamines: NEGATIVE
Barbiturates: NEGATIVE
Benzodiazepines: NEGATIVE
Cocaine: NEGATIVE
Fentanyl: NEGATIVE
Methadone Scn, Ur: NEGATIVE
Opiates: NEGATIVE
Tetrahydrocannabinol: POSITIVE — AB

## 2024-04-05 MED ORDER — LEVOTHYROXINE SODIUM 25 MCG PO TABS: 25.0000 ug | ORAL_TABLET | Freq: Every day | ORAL | 0 refills | Status: AC

## 2024-04-05 MED ORDER — LOSARTAN POTASSIUM 50 MG PO TABS: 50.0000 mg | ORAL_TABLET | Freq: Every day | ORAL | 0 refills | Status: AC

## 2024-04-05 MED ORDER — ROSUVASTATIN CALCIUM 40 MG PO TABS: 40.0000 mg | ORAL_TABLET | Freq: Every day | ORAL | 0 refills | Status: AC

## 2024-04-05 NOTE — Discharge Instructions (Signed)
 MORNING MEDS -Levothyroxine  -Rosuvastatin  40mg  -Losartan  50mg  -Propanolol 20mg  -Gabapentin  300mg  -Venlafaxine  225mg  (three capsules)  MIDDAY MEDS -Gabapentin  300mg   BEDTIME MEDS -Doxepin  50mg  -Mirtazapine  7.5mg  -Propanolol 20mg  -Gabapentin  300mg 

## 2024-04-05 NOTE — ED Notes (Signed)
 Patient refused d/c vitals. Patient ambulatory with steady gait to transport van.

## 2024-04-05 NOTE — ED Triage Notes (Signed)
 Pt bib [facility] for dry mouth and anxiety. Pt states there is poison in her blood and is confused about her home meds and last nights discharge. Pt c/o insomnia and states when she passes trees she see bright lights.

## 2024-04-05 NOTE — ED Notes (Signed)
 Patient ambulatory with no assistance to restroom and back.

## 2024-04-05 NOTE — Discharge Instructions (Signed)
 You were seen today for ongoing symptoms of anxiety.  It is important that you continue your medications as prescribed from your discharge yesterday.  I have given you refills of your other medications including thyroid  and cholesterol medications.

## 2024-04-05 NOTE — ED Provider Notes (Signed)
 [facility]-EMERGENCY DEPT [facility] Emergency Department Provider Note MRN:  [id number]  Arrival date & time: 04/05/24     Chief Complaint   Anxiety   History of Present Illness   Dawn Kelley is a 63 y.o. year-old female with a history of generalized anxiety disorder presenting to the ED with chief complaint of anxiety.  Patient still having a lot of anxiety, trouble sleeping, worried about lack of sleep, worried about taking her medicines correctly, does not think she can do it.  Review of Systems  A thorough review of systems was obtained and all systems are negative except as noted in the HPI and PMH.   Patient's Health History    Past Medical History:  Diagnosis Date   Anxiety    Bipolar 1 disorder (HCC)    Depression    OCD (obsessive compulsive disorder)    PTSD (post-traumatic stress disorder)    S/P ECT (electroconvulsive therapy)     Past Surgical History:  Procedure Laterality Date   BIOPSY  02/03/2020   Procedure: BIOPSY;  Surgeon: Eartha Angelia Sieving, MD;  Location: [facility] ENDO SUITE;  Service: Gastroenterology;;   COLONOSCOPY WITH PROPOFOL  N/A 02/03/2020   Eartha: 3mm polyp in sigmoid colon  as well as hemorrhoids, histology consistent with tubular adenoma   ESOPHAGOGASTRODUODENOSCOPY (EGD) WITH PROPOFOL  N/A 02/03/2020   castaneda: small gastric polyps, sessile, and fundic gland, small bowel bx normal   INDUCED ABORTION N/A 1980   POLYPECTOMY  02/03/2020   Procedure: POLYPECTOMY;  Surgeon: Eartha Angelia Sieving, MD;  Location: [facility] ENDO SUITE;  Service: Gastroenterology;;   VEIN SURGERY      Family History  Problem Relation Age of Onset   Depression Mother    Pneumonia Mother     Social History   Socioeconomic History   Marital status: Married    Spouse name: Not on file   Number of children: Not on file   Years of education: Not on file   Highest education level: Not on file  Occupational History   Occupation: Disabled  Tobacco Use    Smoking status: Never    Passive exposure: Current   Smokeless tobacco: Never  Vaping Use   Vaping status: Never Used  Substance and Sexual Activity   Alcohol use: Yes    Alcohol/week: 10.0 standard drinks of alcohol    Types: 10 Cans of beer per week    Comment: daily, last drank last night   Drug use: Yes    Types: Marijuana    Comment: daily   Sexual activity: Not Currently    Birth control/protection: Post-menopausal  Other Topics Concern   Not on file  Social History Narrative   Pt lives in [place] with husband.  She is on disability.  Pt stated that she receives outpatient psychiatry services through [facility]  She does not receive outpatient therapy.   Social Drivers of Corporate Investment Banker Strain: Low Risk  (10/23/2022)   Overall Financial Resource Strain (CARDIA)    Difficulty of Paying Living Expenses: Not hard at all  Food Insecurity: No Food Insecurity (03/28/2024)   Hunger Vital Sign    Worried About Running Out of Food in the Last Year: Never true    Ran Out of Food in the Last Year: Never true  Transportation Needs: No Transportation Needs (03/28/2024)   PRAPARE - Administrator, Civil Service (Medical): No    Lack of Transportation (Non-Medical): No  Physical Activity: Insufficiently Active (10/23/2022)   Exercise  Vital Sign    Days of Exercise per Week: 2 days    Minutes of Exercise per Session: 10 min  Stress: No Stress Concern Present (10/23/2022)   [employer] of Occupational Health - Occupational Stress Questionnaire    Feeling of Stress : Not at all  Social Connections: Moderately Isolated (03/28/2024)   Social Connection and Isolation Panel    Frequency of Communication with Friends and Family: Three times a week    Frequency of Social Gatherings with Friends and Family: Three times a week    Attends Religious Services: Never    Active Member of Clubs or Organizations: No    Attends Banker Meetings:  Never    Marital Status: Married  Catering Manager Violence: Not At Risk (03/28/2024)   Humiliation, Afraid, Rape, and Kick questionnaire    Fear of Current or Ex-Partner: No    Emotionally Abused: No    Physically Abused: No    Sexually Abused: No     Physical Exam   Vitals:   04/05/24 2337  BP: 131/73  Pulse: 71  Resp: 18  Temp: (!) 97.1 F (36.2 C)  SpO2: 98%    CONSTITUTIONAL: Well-appearing, very anxious NEURO/PSYCH:  Alert and oriented x 3, no focal deficits EYES:  eyes equal and reactive ENT/NECK:  no LAD, no JVD CARDIO: Regular rate, well-perfused, normal S1 and S2 PULM:  CTAB no wheezing or rhonchi GI/GU:  non-distended, non-tender MSK/SPINE:  No gross deformities, no edema SKIN:  no rash, atraumatic   *Additional and/or pertinent findings included in MDM below  Diagnostic and Interventional Summary    EKG Interpretation Date/Time:    Ventricular Rate:    PR Interval:    QRS Duration:    QT Interval:    QTC Calculation:   R Axis:      Text Interpretation:         Labs Reviewed - No data to display  No orders to display    Medications - No data to display   Procedures  /  Critical Care Procedures  ED Course and Medical Decision Making  Initial Impression and Ddx Patient has had repeated visits here in the emergency department for anxiety and insomnia.  Has been recently medically cleared and recently admitted to inpatient psych for a few days.  She has follow-up tomorrow morning with a counselor or psychiatrist.  Her vitals are normal, there is no emergent process occurring at this time.  She needs reassurance and a pep talk and some help with her medicines which we will provide.  Past medical/surgical history that increases complexity of ED encounter: Anxiety  Interpretation of Diagnostics Laboratory and/or imaging options to aid in the diagnosis/care of the patient were considered.  After careful history and physical examination, it was  determined that there was no indication for diagnostics at this time.  Patient Reassessment and Ultimate Disposition/Management     Discharge  Patient management required discussion with the following services or consulting groups:  None  Complexity of Problems Addressed Acute complicated illness or Injury  Additional Data Reviewed and Analyzed Further history obtained from: None  Additional Factors Impacting ED Encounter Risk None  Ozell HERO. Theadore, MD [facility] Emergency Medicine [facility] [email or web address]  Final Clinical Impressions(s) / ED Diagnoses     ICD-10-CM   1. Anxiety  F41.9       ED Discharge Orders     None        Discharge Instructions Discussed  with and Provided to Patient:    Discharge Instructions      MORNING MEDS -Levothyroxine  -Rosuvastatin  40mg  -Losartan  50mg  -Propanolol 20mg  -Gabapentin  300mg  -Venlafaxine  225mg  (three capsules)  MIDDAY MEDS -Gabapentin  300mg   BEDTIME MEDS -Doxepin  50mg  -Mirtazapine  7.5mg  -Propanolol 20mg  -Gabapentin  300mg       Theadore Ozell HERO, MD 04/05/24 2359

## 2024-04-06 ENCOUNTER — Ambulatory Visit (HOSPITAL_COMMUNITY)
Admission: EM | Admit: 2024-04-06 | Discharge: 2024-04-06 | Disposition: A | Discharge status: Other Acute Inpatient Hospital

## 2024-04-06 ENCOUNTER — Telehealth (HOSPITAL_COMMUNITY): Payer: Self-pay | Admitting: Professional

## 2024-04-06 ENCOUNTER — Ambulatory Visit (HOSPITAL_COMMUNITY)

## 2024-04-06 ENCOUNTER — Encounter (HOSPITAL_COMMUNITY): Payer: Self-pay

## 2024-04-06 ENCOUNTER — Other Ambulatory Visit (HOSPITAL_COMMUNITY): Payer: Self-pay

## 2024-04-06 DIAGNOSIS — Z888 Allergy status to other drugs, medicaments and biological substances status: Secondary | ICD-10-CM | POA: Diagnosis not present

## 2024-04-06 DIAGNOSIS — F411 Generalized anxiety disorder: Secondary | ICD-10-CM | POA: Insufficient documentation

## 2024-04-06 DIAGNOSIS — F1090 Alcohol use, unspecified, uncomplicated: Secondary | ICD-10-CM | POA: Diagnosis not present

## 2024-04-06 DIAGNOSIS — Z818 Family history of other mental and behavioral disorders: Secondary | ICD-10-CM | POA: Diagnosis not present

## 2024-04-06 DIAGNOSIS — R9431 Abnormal electrocardiogram [ECG] [EKG]: Secondary | ICD-10-CM | POA: Diagnosis not present

## 2024-04-06 DIAGNOSIS — F1290 Cannabis use, unspecified, uncomplicated: Secondary | ICD-10-CM | POA: Insufficient documentation

## 2024-04-06 DIAGNOSIS — Z79899 Other long term (current) drug therapy: Secondary | ICD-10-CM | POA: Diagnosis not present

## 2024-04-06 DIAGNOSIS — F22 Delusional disorders: Secondary | ICD-10-CM | POA: Diagnosis not present

## 2024-04-06 LAB — COMPREHENSIVE METABOLIC PANEL WITH GFR
ALT: 33 U/L (ref 0–44)
AST: 31 U/L (ref 15–41)
Albumin: 3.8 g/dL (ref 3.5–5.0)
Alkaline Phosphatase: 89 U/L (ref 38–126)
Anion gap: 15 (ref 5–15)
BUN: 12 mg/dL (ref 8–23)
CO2: 23 mmol/L (ref 22–32)
Calcium: 9.8 mg/dL (ref 8.9–10.3)
Chloride: 99 mmol/L (ref 98–111)
Creatinine, Ser: 0.62 mg/dL (ref 0.44–1.00)
GFR, Estimated: >60 mL/min (ref >60)
Glucose, Bld: 110 mg/dL — ABNORMAL HIGH (ref 70–99)
Potassium: 3.9 mmol/L (ref 3.5–5.1)
Sodium: 137 mmol/L (ref 135–145)
Total Bilirubin: 0.6 mg/dL (ref 0.0–1.2)
Total Protein: 7.1 g/dL (ref 6.5–8.1)

## 2024-04-06 LAB — POCT URINE DRUG SCREEN - MANUAL ENTRY (I-SCREEN)
POC Amphetamine UR: NOT DETECTED
POC Buprenorphine (BUP): NOT DETECTED
POC Cocaine UR: NOT DETECTED
POC Marijuana UR: POSITIVE — AB
POC Methadone UR: NOT DETECTED
POC Methamphetamine UR: NOT DETECTED
POC Morphine: NOT DETECTED
POC Oxazepam (BZO): NOT DETECTED
POC Oxycodone UR: NOT DETECTED
POC Secobarbital (BAR): NOT DETECTED

## 2024-04-06 LAB — CBC WITH DIFFERENTIAL/PLATELET
Abs Immature Granulocytes: 0.04 K/uL (ref 0.00–0.07)
Basophils Absolute: 0 K/uL (ref 0.0–0.1)
Basophils Relative: 0 %
Eosinophils Absolute: 0.1 K/uL (ref 0.0–0.5)
Eosinophils Relative: 1 %
HCT: 45.5 % (ref 36.0–46.0)
Hemoglobin: 15.3 g/dL — ABNORMAL HIGH (ref 12.0–15.0)
Immature Granulocytes: 0 %
Lymphocytes Relative: 17 %
Lymphs Abs: 2.2 K/uL (ref 0.7–4.0)
MCH: 29.8 pg (ref 26.0–34.0)
MCHC: 33.6 g/dL (ref 30.0–36.0)
MCV: 88.7 fL (ref 80.0–100.0)
Monocytes Absolute: 0.6 K/uL (ref 0.1–1.0)
Monocytes Relative: 5 %
Neutro Abs: 9.8 K/uL — ABNORMAL HIGH (ref 1.7–7.7)
Neutrophils Relative %: 77 %
Platelets: 351 K/uL (ref 150–400)
RBC: 5.13 MIL/uL — ABNORMAL HIGH (ref 3.87–5.11)
RDW: 13.5 % (ref 11.5–15.5)
WBC: 12.7 K/uL — ABNORMAL HIGH (ref 4.0–10.5)
nRBC: 0 % (ref 0.0–0.2)

## 2024-04-06 LAB — POC SARS CORONAVIRUS 2 AG: SARSCOV2ONAVIRUS 2 AG: NEGATIVE

## 2024-04-06 LAB — POC URINE PREG, ED: Preg Test, Ur: NEGATIVE

## 2024-04-06 LAB — TSH: TSH: 2.326 u[IU]/mL (ref 0.350–4.500)

## 2024-04-06 LAB — ETHANOL: Alcohol, Ethyl (B): <15 mg/dL (ref <15)

## 2024-04-06 LAB — MAGNESIUM: Magnesium: 2.1 mg/dL (ref 1.7–2.4)

## 2024-04-06 MED ORDER — HYDROXYZINE HCL 10 MG PO TABS
10.0000 mg | ORAL_TABLET | Freq: Three times a day (TID) | ORAL | Status: DC | PRN
  Administered 2024-04-06: 10 mg via ORAL
  Filled 2024-04-06: qty 1

## 2024-04-06 MED ORDER — LEVOTHYROXINE SODIUM 25 MCG PO TABS: 25.0000 ug | ORAL_TABLET | Freq: Every day | ORAL | Status: DC

## 2024-04-06 MED ORDER — MAGNESIUM HYDROXIDE 400 MG/5ML PO SUSP: 30.0000 mL | Freq: Every day | ORAL | Status: DC | PRN

## 2024-04-06 MED ORDER — ROSUVASTATIN CALCIUM 20 MG PO TABS
40.0000 mg | ORAL_TABLET | Freq: Every day | ORAL | Status: DC
  Administered 2024-04-06: 40 mg via ORAL
  Filled 2024-04-06: qty 2

## 2024-04-06 MED ORDER — LOSARTAN POTASSIUM 50 MG PO TABS
50.0000 mg | ORAL_TABLET | Freq: Every day | ORAL | Status: DC
  Administered 2024-04-06: 50 mg via ORAL
  Filled 2024-04-06: qty 1

## 2024-04-06 MED ORDER — DOXEPIN HCL 25 MG PO CAPS: 50.0000 mg | ORAL_CAPSULE | Freq: Every day | ORAL | Status: DC

## 2024-04-06 MED ORDER — GABAPENTIN 300 MG PO CAPS: 300.0000 mg | ORAL_CAPSULE | Freq: Three times a day (TID) | ORAL | Status: DC

## 2024-04-06 MED ORDER — VENLAFAXINE HCL ER 75 MG PO CP24: 225.0000 mg | ORAL_CAPSULE | Freq: Every day | ORAL | Status: DC

## 2024-04-06 MED ORDER — PROPRANOLOL HCL 20 MG PO TABS
20.0000 mg | ORAL_TABLET | Freq: Two times a day (BID) | ORAL | Status: DC
  Administered 2024-04-06: 20 mg via ORAL
  Filled 2024-04-06: qty 1

## 2024-04-06 MED ORDER — MIRTAZAPINE 7.5 MG PO TABS: 7.5000 mg | ORAL_TABLET | Freq: Every day | ORAL | Status: DC

## 2024-04-06 MED ORDER — CLONIDINE HCL 0.1 MG PO TABS
0.1000 mg | ORAL_TABLET | Freq: Every day | ORAL | Status: DC
  Administered 2024-04-06: 0.1 mg via ORAL
  Filled 2024-04-06: qty 1

## 2024-04-06 MED ORDER — OLANZAPINE 10 MG IM SOLR: 5.0000 mg | Freq: Three times a day (TID) | INTRAMUSCULAR | Status: DC | PRN

## 2024-04-06 MED ORDER — ALUM & MAG HYDROXIDE-SIMETH 200-200-20 MG/5ML PO SUSP: 30.0000 mL | ORAL | Status: DC | PRN

## 2024-04-06 MED ORDER — OMEGA-3-ACID ETHYL ESTERS 1 G PO CAPS
1.0000 g | ORAL_CAPSULE | Freq: Two times a day (BID) | ORAL | Status: DC
  Administered 2024-04-06: 1 g via ORAL
  Filled 2024-04-06: qty 1

## 2024-04-06 MED ORDER — OLANZAPINE 5 MG PO TBDP: 5.0000 mg | ORAL_TABLET | Freq: Three times a day (TID) | ORAL | Status: DC | PRN

## 2024-04-06 NOTE — Progress Notes (Signed)
 Pt has been accepted to [facility] on 04/06/2024 Bed assignment: TBD   Pt meets inpatient criteria per: [name] NP  Attending Physician will be Steffan Shingles MD  Report can be called to: [phone]   Pt can arrive AFTER NEG COVID   Care Team Notified: [name] RN, Clement Gerold RN  [place] Sylvester Minton LCSW-A   04/06/2024 1:54 PM

## 2024-04-06 NOTE — Progress Notes (Addendum)
 PATIENT IS NO LONGER GOING TO [facility]    Pt has been accepted to [facility] TOMORROW (06/08/23 pending  Main campus  Pt meets inpatient criteria per: [name] NP  Attending Physician will be Millie Manners, MD  Report can be called to: [phone] (this is a pager, please leave call-back number when giving report)  Pt can arrive after 8 AM  Care Team Notified: [name] NP, Clement Gerold RN  T[place] Falon Huesca LCSW-A   04/06/2024 12:55 PM

## 2024-04-06 NOTE — ED Notes (Signed)
 Patient admitted to adult side [facility]. Patient is AOX4, denies pain but reports that she hasn't slept in 3 days and has to take her current scheduled medications for her BP. Appears extremely anxious, wide eyed. However, has cooperative and able to take medications. Gave her PRN atarax  for anxiety. States her support system is her boyfriend. Safety measures and POC initiated.

## 2024-04-06 NOTE — Progress Notes (Signed)
   04/06/24 1022  [facility] Triage Screening (Walk-ins at [facility] only)  What Is the Reason for Your Visit/Call Today? UTA

## 2024-04-06 NOTE — Progress Notes (Signed)
 LCSW Progress Note  [id number]   Dawn Kelley  04/06/2024  12:33 PM  Description:   Inpatient Psychiatric Referral  Patient was recommended inpatient per [name]  (NP). There are no available beds at [facility], per [name]). Patient was referred to the following out of network facilities:   Destination  Service Provider Address Phone Fax  [facility]  [address] [place], [place] [address] [phone]  [facility]  [address] [place] [address] [phone]  [facility]  [address] [place] [address] [phone]  [facility]  [address], [place] [address] [phone]  [facility]  [address], [place] [address] [phone]  [facility] EFAX  [address] [place], [place] [phone]  [facility]  [address], [place] [address] [phone]  [facility]  [address] [place] [address] [phone]      Situation ongoing, CSW to continue following and update chart as more information becomes available.     [name], MSW, LCSW  04/06/2024 12:33 PM

## 2024-04-06 NOTE — Telephone Encounter (Signed)
 Dawn Kelley called and reports she has an appointment for the assessment before the classes this morning. She shares that she is going crazy and can't handle this. When asked to elaborate, she reports she feels insane because of trees. They are giving off a strobe light and it's making my vertigo worse. She reports she is scared of the trees.  She also shares, I want the Lord to take me. I can't take this anymore. I can't eat, I can't drink. My saliva is completely dried up. I'm desperate for help.I can't keep doing this. No one will help me.  She states she has been in multiple places recently but has not gotten help. She reports I haven't been taking my psych meds because I'm getting sicker and I'm scared. I'm scared out of my mind. My old man of 28 years is totally stressed out because I can't think straight. This has happened to me 3 times.  She shares her concerns for inpatient on a locked unit:  They cannot lock me up and tie me up. I was locked up in a closet for 10 years. They got me pregnant. I can't be locked up. I feel like I choke up and won't get help. If it can happen, it's happened to me.  Husband is driving her to [facility] on [address] and cln directs them to [address] For evaluation. Dawn Kelley reports they are 40 minutes away after asking her husband. Cln shares they could go to [facility] if that is closer and Dawn Kelley says they won't help me there. The ambulance wouldn't even come and get me because they said they can't help me. Dawn Kelley then shares again: I'm running out of time and I'm desperate for my life. I don't want to die but I can't keep doing this. Cln shares that her assessment appointment for PHP can be delayed and rescheduled once she is stabilized on her medications. Cln explains that this provider is not a doctor and is unable to determine her need for level of care over the phone. Paw reports she understands and her husband is taking her to [facility] now. This cln  contacted Elouise Birmingham, who informed this cln that Dr. Lawrnce and [name] are providers at [facility] this morning. Cln unable to send a msg to Dr. Lawrnce via EPIC, but was able to inform [name].

## 2024-04-06 NOTE — ED Provider Notes (Signed)
 [facility] Continuous Assessment Admission H&P  Date: 04/06/24 Patient Name: Dawn Dawn MRN: [id number] Chief Complaint: Anxiety, panis attack  Diagnoses:  Final diagnoses:  GAD (generalized anxiety disorder)    HPI: Patient presents voluntarily to [facility] behavioral health for walk-in assessment.  Patient directed for crisis assessment by partial hospitalization program representative.  Patient scheduled to complete partial hospitalization intake assessment on today.  Avaree discharged from inpatient psychiatric treatment at [facility] on 04/03/2024.  Admission for inpatient psychiatric hospitalization at [facility] on 03/28/2024.  Patient states I have not been able to eat or drink properly for 3 months, I have not been able to sleep for 3 months.  I am history of going up like a prune I am drinking but I am still dehydrated.  My kidneys hurt, I feel like everything has begun to shut down.  Dawn Dawn reports increasing anxiety x 3 months.  She endorses apparent paranoid ideation surrounding her health.  She reports believing that everything has begun to shut down involving her physical health.  Dawn Dawn endorses history of panic attacks, 2 panic attacks over the weekend.  She has been seen in the emergency department on 04/05/2024 and 04/04/2024 for reported increased anxiety and panic.  Patient is assessed by this nurse practitioner face-to-face.  Patient prefers that her significant other, Dallas Speed, remain present during assessment.  She is seated in assessment area, no apparent distress.  She is alert and oriented, cooperative during assessment.  Patient presents with anxious mood.  Patient endorses depressive symptoms including decreased sleep and appetite x 3 weeks.  Reports overwhelming anxiety and restlessness.  Patient reports anxiety triggered when attempting to go out into the public.  Dawn Dawn reports compliance with medications as  scheduled when discharged on 04/03/2024.  Yesterday she used an old medication quetiapine , 150 mg in an effort to sleep.  Quetiapine  is charted as an allergy for this patient  causing anaphylaxis and delirious.  Patient is scheduled to initiate care at [facility] behavioral health after completion of partial hospitalization program.  She reports compliance with current medications.  She meets with individual counseling provider, Ubaldo Mace.  Ubaldo is a Holiday representative with the [employer] in [place].  Patient endorses history of 10-12 previous inpatient psychiatric hospitalizations.  Family mental health history includes patient's mother diagnoses major depressive disorder.  Dawn Dawn resides with significant other in [place] .  She denies access to weapons.  She receives [employer] income.  She endorses marijuana use.  Most recent marijuana use 4 days ago.  She endorses history of alcohol use disorder, most recent alcohol use 9 days ago.  She denies substance use aside from [facility] use.  Patient offered support and encouragement.    Dawn Dawn denies suicidal or homicidal ideation.  She denies history of suicide attempts, denies history of nonsuicidal self-harm behavior.  She denies auditory and visual hallucinations.  Has no evidence of delusional thought content and no indication that patient is responding to internal stimuli.  Per PHP clinician documented 04/06/2024- 0917am Danniell called and reports she has an appointment for the assessment before the classes this morning. She shares that she is going crazy and can't handle this. When asked to elaborate, she reports she feels insane because of trees. They are giving off a strobe light and it's making my vertigo worse. She reports she is scared of the trees.  She also shares, I want the Lord to take me. I can't take this anymore. I can't  eat, I can't drink. My saliva is completely dried up. I'm desperate for help.I can't keep  doing this. No one will help me.  She states she has been in multiple places recently but has not gotten help. She reports I haven't been taking my psych meds because I'm getting sicker and I'm scared. I'm scared out of my mind. My old man of 28 years is totally stressed out because I can't think straight. This has happened to me 3 times. ... Dawn Dawn then shares again: I'm running out of time and I'm desperate for my life. I don't want to die but I can't keep doing this.       Total Time spent with patient: 45 minutes  Musculoskeletal  Strength & Muscle Tone: within normal limits Gait & Station: normal Patient leans: N/A  Psychiatric Specialty Exam  Presentation General Appearance:  Appropriate for Environment; Casual  Eye Contact: Good  Speech: Clear and Coherent; Normal Rate  Speech Volume: Normal  Handedness: Right   Mood and Affect  Mood: Anxious  Affect: Congruent   Thought Process  Thought Processes: Coherent; Goal Directed; Linear  Descriptions of Associations:Intact  Orientation:Full (Time, Place and Person)  Thought Content:Paranoid Ideation  Diagnosis of Schizophrenia or Schizoaffective disorder in past: No   Hallucinations:Hallucinations: None  Ideas of Reference:Paranoia  Suicidal Thoughts:Suicidal Thoughts: No  Homicidal Thoughts:Homicidal Thoughts: No   Sensorium  Memory: Immediate Good; Recent Fair  Judgment: Intact  Insight: Fair   Chartered Certified Accountant: Fair  Attention Span: Fair  Recall: Fair  Fund of Knowledge: Good  Language: Good   Psychomotor Activity  Psychomotor Activity: Psychomotor Activity: Increased   Assets  Assets: Communication Skills; Desire for Improvement; Financial Resources/Insurance; Housing; Physical Health; Resilience; Social Support   Sleep  Sleep: Sleep: Poor   Nutritional Assessment (For OBS and FBC admissions only) Has the patient had a weight loss or  gain of 10 pounds or more in the last 3 months?: No Has the patient had a decrease in food intake/or appetite?: Yes Does the patient have dental problems?: No Does the patient have eating habits or behaviors that may be indicators of an eating disorder including binging or inducing vomiting?: No Has the patient recently lost weight without trying?: 0 Has the patient been eating poorly because of a decreased appetite?: 0 Malnutrition Screening Tool Score: 0    Physical Exam Vitals and nursing note reviewed.  Constitutional:      Appearance: Normal appearance. She is well-developed.  HENT:     Head: Normocephalic and atraumatic.     Nose: Nose normal.  Cardiovascular:     Rate and Rhythm: Normal rate and regular rhythm.     Heart sounds: Normal heart sounds.  Pulmonary:     Effort: Pulmonary effort is normal.     Breath sounds: Normal breath sounds.  Musculoskeletal:        General: Normal range of motion.     Cervical back: Normal range of motion.  Skin:    General: Skin is warm and dry.  Neurological:     Mental Status: She is alert and oriented to person, place, and time.  Psychiatric:        Attention and Perception: Attention and perception normal.        Mood and Affect: Affect normal. Mood is anxious.        Speech: Speech normal.        Behavior: Behavior normal. Behavior is cooperative.  Thought Content: Thought content is paranoid.        Cognition and Memory: Cognition and memory normal.    Review of Systems  Constitutional: Negative.   HENT: Negative.    Eyes: Negative.   Respiratory: Negative.    Cardiovascular: Negative.   Gastrointestinal: Negative.   Genitourinary: Negative.   Musculoskeletal: Negative.   Skin: Negative.   Neurological: Negative.   Psychiatric/Behavioral:  The patient is nervous/anxious and has insomnia.     Blood pressure (!) 158/70, pulse 87, temperature 97.8 F (36.6 C), temperature source Oral, resp. rate 20, SpO2 100%.  There is no height or weight on file to calculate BMI.  Past Psychiatric History: Bipolar 1 disorder, major depressive disorder, recurrent severe, generalized anxiety disorder, obsessive-compulsive disorder, PTSD, mild alcohol use disorder, borderline personality disorder, panic attack, cannabis use disorder, benzodiazepine dependence, psychophysiologic insomnia  Is the patient at risk to self? No  Has the patient been a risk to self in the past 6 months? No .    Has the patient been a risk to self within the distant past? No   Is the patient a risk to others? No   Has the patient been a risk to others in the past 6 months? No   Has the patient been a risk to others within the distant past? No   Past Medical History: Paroxysmal atrial tachycardia, nausea and vomiting, pruritus, chronic [place] area, irritable bowel syndrome with both constipation and diarrhea, abdominal pain, GERD, diarrhea, prediabetes  Family History: Mother: Major depressive disorder  Social History: Resides with significant other and eating.  Receives Social Security income.  Endorses cannabis use.  Last Labs:  Admission on 04/04/2024, Discharged on 04/05/2024  Component Date Value Ref Range Status   Color, Urine 04/04/2024 STRAW (A)  YELLOW Final   APPearance 04/04/2024 CLEAR  CLEAR Final   Specific Gravity, Urine 04/04/2024 1.003 (L)  1.005 - 1.030 Final   pH 04/04/2024 6.0  5.0 - 8.0 Final   Glucose, UA 04/04/2024 NEGATIVE  NEGATIVE mg/dL Final   Hgb urine dipstick 04/04/2024 MODERATE (A)  NEGATIVE Final   Bilirubin Urine 04/04/2024 NEGATIVE  NEGATIVE Final   Ketones, ur 04/04/2024 NEGATIVE  NEGATIVE mg/dL Final   Protein, ur 88/77/7974 NEGATIVE  NEGATIVE mg/dL Final   Nitrite 88/77/7974 NEGATIVE  NEGATIVE Final   Leukocytes,Ua 04/04/2024 NEGATIVE  NEGATIVE Final   RBC / HPF 04/04/2024 0-5  0 - 5 RBC/hpf Final   WBC, UA 04/04/2024 0-5  0 - 5 WBC/hpf Final   Bacteria, UA 04/04/2024 RARE (A)  NONE SEEN Final    Squamous Epithelial / HPF 04/04/2024 0-5  0 - 5 /HPF Final   Mucus 04/04/2024 PRESENT   Final   Performed at [facility], [address], [place], [place] [address]   Opiates 04/04/2024 NEGATIVE  NEGATIVE Final   Cocaine 04/04/2024 NEGATIVE  NEGATIVE Final   Benzodiazepines 04/04/2024 NEGATIVE  NEGATIVE Final   Amphetamines 04/04/2024 NEGATIVE  NEGATIVE Final   Tetrahydrocannabinol 04/04/2024 POSITIVE (A)  NEGATIVE Final   Barbiturates 04/04/2024 NEGATIVE  NEGATIVE Final   Methadone Scn, Ur 04/04/2024 NEGATIVE  NEGATIVE Final   Fentanyl  04/04/2024 NEGATIVE  NEGATIVE Final   Comment: (NOTE) Drug screen is for Medical Purposes only. Positive results are preliminary only. If confirmation is needed, notify lab within 5 days.  Drug Class                 Cutoff (ng/mL) Amphetamine and metabolites 1000 Barbiturate and metabolites 200 Benzodiazepine  200 Opiates and metabolites     300 Cocaine and metabolites     300 THC                         50 Fentanyl                     5 Methadone                   300  Trazodone is metabolized in vivo to several metabolites,  including pharmacologically active m-CPP, which is excreted in the  urine.  Immunoassay screens for amphetamines and MDMA have potential  cross-reactivity with these compounds and may provide false positive  result.  Performed at [facility], [address] [place], [place] [address]   Admission on 03/28/2024, Discharged on 04/03/2024  Component Date Value Ref Range Status   Cholesterol 03/31/2024 151  0 - 200 mg/dL Final   Comment:        ATP III CLASSIFICATION:  <200     mg/dL   Desirable  799-760  mg/dL   Borderline High  >=759    mg/dL   High           Triglycerides 03/31/2024 110  <150 mg/dL Final   HDL 88/81/7974 51  >40 mg/dL Final   Total CHOL/HDL Ratio 03/31/2024 3.0  RATIO Final   VLDL 03/31/2024 22  0 - 40 mg/dL Final   LDL Cholesterol 03/31/2024 78  0 - 99 mg/dL Final   Comment:         Total Cholesterol/HDL:CHD Risk Coronary Heart Disease Risk Table                     Men   Women  1/2 Average Risk   3.4   3.3  Average Risk       5.0   4.4  2 X Average Risk   9.6   7.1  3 X Average Risk  23.4   11.0        Use the calculated Patient Ratio above and the CHD Risk Table to determine the patient's CHD Risk.        ATP III CLASSIFICATION (LDL):  <100     mg/dL   Optimal  899-870  mg/dL   Near or Above                    Optimal  130-159  mg/dL   Borderline  839-810  mg/dL   High  >809     mg/dL   Very High Performed at [facility], [address] [place], [place] [address]   Admission on 03/28/2024, Discharged on 03/28/2024  Component Date Value Ref Range Status   Sodium 03/28/2024 142  135 - 145 mmol/L Final   Potassium 03/28/2024 3.2 (L)  3.5 - 5.1 mmol/L Final   Chloride 03/28/2024 103  98 - 111 mmol/L Final   CO2 03/28/2024 26  22 - 32 mmol/L Final   Glucose, Bld 03/28/2024 124 (H)  70 - 99 mg/dL Final   Glucose reference range applies only to samples taken after fasting for at least 8 hours.   BUN 03/28/2024 5 (L)  8 - 23 mg/dL Final   Creatinine, Ser 03/28/2024 0.51  0.44 - 1.00 mg/dL Final   Calcium  03/28/2024 9.2  8.9 - 10.3 mg/dL Final   Total Protein 88/84/7974 7.4  6.5 - 8.1 g/dL Final  Albumin 03/28/2024 4.4  3.5 - 5.0 g/dL Final   AST 88/84/7974 19  15 - 41 U/L Final   ALT 03/28/2024 19  0 - 44 U/L Final   Alkaline Phosphatase 03/28/2024 111  38 - 126 U/L Final   Total Bilirubin 03/28/2024 0.2  0.0 - 1.2 mg/dL Final   GFR, Estimated 03/28/2024 >60  >60 mL/min Final   Comment: (NOTE) Calculated using the CKD-EPI Creatinine Equation (2021)    Anion gap 03/28/2024 14  5 - 15 Final   Performed at [facility], [address], [place], [place] [address]   Alcohol, Ethyl (B) 03/28/2024 <15  <15 mg/dL Final   Comment: (NOTE) For medical purposes only. Performed at [facility], [address], [place], [place] [address]    Opiates  03/28/2024 NEGATIVE  NEGATIVE Final   Cocaine 03/28/2024 NEGATIVE  NEGATIVE Final   Benzodiazepines 03/28/2024 NEGATIVE  NEGATIVE Final   Amphetamines 03/28/2024 NEGATIVE  NEGATIVE Final   Tetrahydrocannabinol 03/28/2024 POSITIVE (A)  NEGATIVE Final   Barbiturates 03/28/2024 NEGATIVE  NEGATIVE Final   Methadone Scn, Ur 03/28/2024 NEGATIVE  NEGATIVE Final   Fentanyl  03/28/2024 NEGATIVE  NEGATIVE Final   Comment: (NOTE) Drug screen is for Medical Purposes only. Positive results are preliminary only. If confirmation is needed, notify lab within 5 days.  Drug Class                 Cutoff (ng/mL) Amphetamine and metabolites 1000 Barbiturate and metabolites 200 Benzodiazepine              200 Opiates and metabolites     300 Cocaine and metabolites     300 THC                         50 Fentanyl                     5 Methadone                   300  Trazodone is metabolized in vivo to several metabolites,  including pharmacologically active m-CPP, which is excreted in the  urine.  Immunoassay screens for amphetamines and MDMA have potential  cross-reactivity with these compounds and may provide false positive  result.  Performed at [facility], [address] [place], [place] [address]    WBC 03/28/2024 10.3  4.0 - 10.5 K/uL Final   RBC 03/28/2024 4.81  3.87 - 5.11 MIL/uL Final   Hemoglobin 03/28/2024 14.5  12.0 - 15.0 g/dL Final   HCT 88/84/7974 42.7  36.0 - 46.0 % Final   MCV 03/28/2024 88.8  80.0 - 100.0 fL Final   MCH 03/28/2024 30.1  26.0 - 34.0 pg Final   MCHC 03/28/2024 34.0  30.0 - 36.0 g/dL Final   RDW 88/84/7974 13.7  11.5 - 15.5 % Final   Platelets 03/28/2024 321  150 - 400 K/uL Final   nRBC 03/28/2024 0.0  0.0 - 0.2 % Final   Neutrophils Relative % 03/28/2024 75  % Final   Neutro Abs 03/28/2024 7.6  1.7 - 7.7 K/uL Final   Lymphocytes Relative 03/28/2024 19  % Final   Lymphs Abs 03/28/2024 2.0  0.7 - 4.0 K/uL Final   Monocytes Relative 03/28/2024 6  % Final    Monocytes Absolute 03/28/2024 0.6  0.1 - 1.0 K/uL Final   Eosinophils Relative 03/28/2024 0  % Final   Eosinophils Absolute 03/28/2024 0.0  0.0 -  0.5 K/uL Final   Basophils Relative 03/28/2024 0  % Final   Basophils Absolute 03/28/2024 0.0  0.0 - 0.1 K/uL Final   Immature Granulocytes 03/28/2024 0  % Final   Abs Immature Granulocytes 03/28/2024 0.03  0.00 - 0.07 K/uL Final   Performed at [facility], [address] [place], [place] [address]   TSH 03/28/2024 1.260  0.350 - 4.500 uIU/mL Final   Performed at [facility], [address] [place], [place] [address]   SARS Coronavirus 2 by RT PCR 03/28/2024 NEGATIVE  NEGATIVE Final   Comment: (NOTE) SARS-CoV-2 target nucleic acids are NOT DETECTED.  The SARS-CoV-2 RNA is generally detectable in upper and lower respiratory specimens during the acute phase of infection. The lowest concentration of SARS-CoV-2 viral copies this assay can detect is 250 copies / mL. A negative result does not preclude SARS-CoV-2 infection and should not be used as the sole basis for treatment or other patient management decisions.  A negative result may occur with improper specimen collection / handling, submission of specimen other than nasopharyngeal swab, presence of viral mutation(s) within the areas targeted by this assay, and inadequate number of viral copies (<250 copies / mL). A negative result must be combined with clinical observations, patient history, and epidemiological information.  Fact Sheet for Patients:   roadlaptop.co.za  Fact Sheet for Healthcare Providers: http://kim-miller.com/  This test is not yet approved or                           cleared by the [place]  FDA and has been authorized for detection and/or diagnosis of SARS-CoV-2 by FDA under an Emergency Use Authorization (EUA).  This EUA will remain in effect (meaning this test can be used) for the duration of the COVID-19  declaration under Section 564(b)(1) of the Act, 21 U.S.C. section 360bbb-3(b)(1), unless the authorization is terminated or revoked sooner.  Performed at [facility], [address] [place], [place] [address]   Admission on 11/21/2023, Discharged on 11/21/2023  Component Date Value Ref Range Status   WBC 11/21/2023 10.9 (H)  4.0 - 10.5 K/uL Final   RBC 11/21/2023 5.06  3.87 - 5.11 MIL/uL Final   Hemoglobin 11/21/2023 14.4  12.0 - 15.0 g/dL Final   HCT 92/89/7974 45.1  36.0 - 46.0 % Final   MCV 11/21/2023 89.1  80.0 - 100.0 fL Final   MCH 11/21/2023 28.5  26.0 - 34.0 pg Final   MCHC 11/21/2023 31.9  30.0 - 36.0 g/dL Final   RDW 92/89/7974 14.9  11.5 - 15.5 % Final   Platelets 11/21/2023 PLATELET CLUMPS NOTED ON SMEAR, UNABLE TO ESTIMATE  150 - 400 K/uL Final   REPEATED TO VERIFY   nRBC 11/21/2023 0.0  0.0 - 0.2 % Final   Neutrophils Relative % 11/21/2023 75  % Final   Neutro Abs 11/21/2023 8.1 (H)  1.7 - 7.7 K/uL Final   Lymphocytes Relative 11/21/2023 20  % Final   Lymphs Abs 11/21/2023 2.2  0.7 - 4.0 K/uL Final   Monocytes Relative 11/21/2023 4  % Final   Monocytes Absolute 11/21/2023 0.4  0.1 - 1.0 K/uL Final   Eosinophils Relative 11/21/2023 0  % Final   Eosinophils Absolute 11/21/2023 0.0  0.0 - 0.5 K/uL Final   Basophils Relative 11/21/2023 1  % Final   Basophils Absolute 11/21/2023 0.1  0.0 - 0.1 K/uL Final   WBC Morphology 11/21/2023 MORPHOLOGY UNREMARKABLE   Final  RBC Morphology 11/21/2023 MORPHOLOGY UNREMARKABLE   Final   Smear Review 11/21/2023 Reviewed   Final   Immature Granulocytes 11/21/2023 0  % Final   Abs Immature Granulocytes 11/21/2023 0.03  0.00 - 0.07 K/uL Final   Performed at [facility], [address] [place], [place] [address]   Sodium 11/21/2023 140  135 - 145 mmol/L Final   Potassium 11/21/2023 3.4 (L)  3.5 - 5.1 mmol/L Final   Chloride 11/21/2023 106  98 - 111 mmol/L Final   CO2 11/21/2023 18 (L)  22 - 32 mmol/L Final   Glucose, Bld 11/21/2023  126 (H)  70 - 99 mg/dL Final   Glucose reference range applies only to samples taken after fasting for at least 8 hours.   BUN 11/21/2023 11  8 - 23 mg/dL Final   Creatinine, Ser 11/21/2023 0.51  0.44 - 1.00 mg/dL Final   Calcium  11/21/2023 9.3  8.9 - 10.3 mg/dL Final   Total Protein 92/89/7974 8.2 (H)  6.5 - 8.1 g/dL Final   Albumin 92/89/7974 4.4  3.5 - 5.0 g/dL Final   AST 92/89/7974 21  15 - 41 U/L Final   ALT 11/21/2023 19  0 - 44 U/L Final   Alkaline Phosphatase 11/21/2023 96  38 - 126 U/L Final   Total Bilirubin 11/21/2023 0.4  0.0 - 1.2 mg/dL Final   GFR, Estimated 11/21/2023 >60  >60 mL/min Final   Comment: (NOTE) Calculated using the CKD-EPI Creatinine Equation (2021)    Anion gap 11/21/2023 16 (H)  5 - 15 Final   Performed at [facility], [address], [place], [place] [address]   Alcohol, Ethyl (B) 11/21/2023 <15  <15 mg/dL Final   Comment: (NOTE) For medical purposes only. Performed at [facility], [address] [place], [place] [address]    Opiates 11/21/2023 NONE DETECTED  NONE DETECTED Final   Cocaine 11/21/2023 NONE DETECTED  NONE DETECTED Final   Benzodiazepines 11/21/2023 POSITIVE (A)  NONE DETECTED Final   Amphetamines 11/21/2023 NONE DETECTED  NONE DETECTED Final   Tetrahydrocannabinol 11/21/2023 POSITIVE (A)  NONE DETECTED Final   Barbiturates 11/21/2023 NONE DETECTED  NONE DETECTED Final   Comment: (NOTE) DRUG SCREEN FOR MEDICAL PURPOSES ONLY.  IF CONFIRMATION IS NEEDED FOR ANY PURPOSE, NOTIFY LAB WITHIN 5 DAYS.  LOWEST DETECTABLE LIMITS FOR URINE DRUG SCREEN Drug Class                     Cutoff (ng/mL) Amphetamine and metabolites    1000 Barbiturate and metabolites    200 Benzodiazepine                 200 Opiates and metabolites        300 Cocaine and metabolites        300 THC                            50 Performed at [facility], [address] [place], [place] [address]   Admission on 10/21/2023, Discharged on 10/22/2023  Component Date  Value Ref Range Status   Color, Urine 10/21/2023 YELLOW  YELLOW Final   APPearance 10/21/2023 HAZY (A)  CLEAR Final   Specific Gravity, Urine 10/21/2023 1.005  1.005 - 1.030 Final   pH 10/21/2023 6.0  5.0 - 8.0 Final   Glucose, UA 10/21/2023 NEGATIVE  NEGATIVE mg/dL Final   Hgb urine dipstick 10/21/2023 NEGATIVE  NEGATIVE Final   Bilirubin Urine  10/21/2023 NEGATIVE  NEGATIVE Final   Ketones, ur 10/21/2023 NEGATIVE  NEGATIVE mg/dL Final   Protein, ur 93/90/7974 NEGATIVE  NEGATIVE mg/dL Final   Nitrite 93/90/7974 NEGATIVE  NEGATIVE Final   Leukocytes,Ua 10/21/2023 NEGATIVE  NEGATIVE Final   Performed at [facility], [address], [place], [place] [address]   WBC 10/21/2023 11.8 (H)  4.0 - 10.5 K/uL Final   RBC 10/21/2023 5.48 (H)  3.87 - 5.11 MIL/uL Final   Hemoglobin 10/21/2023 15.7 (H)  12.0 - 15.0 g/dL Final   HCT 93/90/7974 47.0 (H)  36.0 - 46.0 % Final   MCV 10/21/2023 85.8  80.0 - 100.0 fL Final   MCH 10/21/2023 28.6  26.0 - 34.0 pg Final   MCHC 10/21/2023 33.4  30.0 - 36.0 g/dL Final   RDW 93/90/7974 12.0  11.5 - 15.5 % Final   Platelets 10/21/2023 407 (H)  150 - 400 K/uL Final   nRBC 10/21/2023 0.0  0.0 - 0.2 % Final   Neutrophils Relative % 10/21/2023 68  % Final   Neutro Abs 10/21/2023 8.0 (H)  1.7 - 7.7 K/uL Final   Lymphocytes Relative 10/21/2023 25  % Final   Lymphs Abs 10/21/2023 2.9  0.7 - 4.0 K/uL Final   Monocytes Relative 10/21/2023 7  % Final   Monocytes Absolute 10/21/2023 0.8  0.1 - 1.0 K/uL Final   Eosinophils Relative 10/21/2023 0  % Final   Eosinophils Absolute 10/21/2023 0.0  0.0 - 0.5 K/uL Final   Basophils Relative 10/21/2023 0  % Final   Basophils Absolute 10/21/2023 0.1  0.0 - 0.1 K/uL Final   Immature Granulocytes 10/21/2023 0  % Final   Abs Immature Granulocytes 10/21/2023 0.05  0.00 - 0.07 K/uL Final   Performed at [facility], [address], [place], [place] [address]   Sodium 10/21/2023 135  135 - 145 mmol/L Final   Potassium 10/21/2023 3.9  3.5  - 5.1 mmol/L Final   Chloride 10/21/2023 103  98 - 111 mmol/L Final   CO2 10/21/2023 20 (L)  22 - 32 mmol/L Final   Glucose, Bld 10/21/2023 117 (H)  70 - 99 mg/dL Final   Glucose reference range applies only to samples taken after fasting for at least 8 hours.   BUN 10/21/2023 9  8 - 23 mg/dL Final   Creatinine, Ser 10/21/2023 0.58  0.44 - 1.00 mg/dL Final   Calcium  10/21/2023 9.5  8.9 - 10.3 mg/dL Final   Total Protein 93/90/7974 8.1  6.5 - 8.1 g/dL Final   Albumin 93/90/7974 4.0  3.5 - 5.0 g/dL Final   AST 93/90/7974 30  15 - 41 U/L Final   ALT 10/21/2023 43  0 - 44 U/L Final   Alkaline Phosphatase 10/21/2023 110  38 - 126 U/L Final   Total Bilirubin 10/21/2023 0.8  0.0 - 1.2 mg/dL Final   GFR, Estimated 10/21/2023 >60  >60 mL/min Final   Comment: (NOTE) Calculated using the CKD-EPI Creatinine Equation (2021)    Anion gap 10/21/2023 12  5 - 15 Final   Performed at [facility], [address], [place], [place] [address]   Alcohol, Ethyl (B) 10/21/2023 <15  <15 mg/dL Final   Comment: (NOTE) For medical purposes only. Performed at [facility], [address], [place], [place] [address]    Opiates 10/21/2023 NONE DETECTED  NONE DETECTED Final   Cocaine 10/21/2023 NONE DETECTED  NONE DETECTED Final   Benzodiazepines 10/21/2023 POSITIVE (A)  NONE DETECTED Final   Amphetamines  10/21/2023 NONE DETECTED  NONE DETECTED Final   Tetrahydrocannabinol 10/21/2023 POSITIVE (A)  NONE DETECTED Final   Barbiturates 10/21/2023 NONE DETECTED  NONE DETECTED Final   Comment: (NOTE) DRUG SCREEN FOR MEDICAL PURPOSES ONLY.  IF CONFIRMATION IS NEEDED FOR ANY PURPOSE, NOTIFY LAB WITHIN 5 DAYS.  LOWEST DETECTABLE LIMITS FOR URINE DRUG SCREEN Drug Class                     Cutoff (ng/mL) Amphetamine and metabolites    1000 Barbiturate and metabolites    200 Benzodiazepine                 200 Opiates and metabolites        300 Cocaine and metabolites        300 THC                             50 Performed at [facility], [address], [place], [place] [address]   Admission on 10/14/2023, Discharged on 10/14/2023  Component Date Value Ref Range Status   TSH 10/14/2023 5.533 (H)  0.350 - 4.500 uIU/mL Final   Comment: Performed by a 3rd Generation assay with a functional sensitivity of <=0.01 uIU/mL. Performed at [facility], [address] [place], [place] [address]    Sodium 10/14/2023 138  135 - 145 mmol/L Final   Potassium 10/14/2023 3.9  3.5 - 5.1 mmol/L Final   Chloride 10/14/2023 103  98 - 111 mmol/L Final   CO2 10/14/2023 21 (L)  22 - 32 mmol/L Final   Glucose, Bld 10/14/2023 122 (H)  70 - 99 mg/dL Final   Glucose reference range applies only to samples taken after fasting for at least 8 hours.   BUN 10/14/2023 13  8 - 23 mg/dL Final   Creatinine, Ser 10/14/2023 0.57  0.44 - 1.00 mg/dL Final   Calcium  10/14/2023 9.3  8.9 - 10.3 mg/dL Final   Total Protein 93/97/7974 7.6  6.5 - 8.1 g/dL Final   Albumin 93/97/7974 3.6  3.5 - 5.0 g/dL Final   AST 93/97/7974 37  15 - 41 U/L Final   ALT 10/14/2023 39  0 - 44 U/L Final   Alkaline Phosphatase 10/14/2023 117  38 - 126 U/L Final   Total Bilirubin 10/14/2023 0.6  0.0 - 1.2 mg/dL Final   GFR, Estimated 10/14/2023 >60  >60 mL/min Final   Comment: (NOTE) Calculated using the CKD-EPI Creatinine Equation (2021)    Anion gap 10/14/2023 14  5 - 15 Final   Performed at [facility], [address] [place], [place] [address]   Alcohol, Ethyl (B) 10/14/2023 <15  <15 mg/dL Final   Comment: (NOTE) For medical purposes only. Performed at [facility], [address], [place], [place] [address]    WBC 10/14/2023 15.7 (H)  4.0 - 10.5 K/uL Final   RBC 10/14/2023 4.95  3.87 - 5.11 MIL/uL Final   Hemoglobin 10/14/2023 14.7  12.0 - 15.0 g/dL Final   HCT 93/97/7974 43.2  36.0 - 46.0 % Final   MCV 10/14/2023 87.3  80.0 - 100.0 fL Final   MCH 10/14/2023 29.7  26.0 - 34.0 pg Final   MCHC 10/14/2023 34.0  30.0 - 36.0 g/dL Final   RDW  93/97/7974 12.3  11.5 - 15.5 % Final   Platelets 10/14/2023 374  150 - 400 K/uL Final   nRBC 10/14/2023 0.0  0.0 - 0.2 % Final  Neutrophils Relative % 10/14/2023 77  % Final   Neutro Abs 10/14/2023 12.0 (H)  1.7 - 7.7 K/uL Final   Lymphocytes Relative 10/14/2023 18  % Final   Lymphs Abs 10/14/2023 2.8  0.7 - 4.0 K/uL Final   Monocytes Relative 10/14/2023 5  % Final   Monocytes Absolute 10/14/2023 0.7  0.1 - 1.0 K/uL Final   Eosinophils Relative 10/14/2023 0  % Final   Eosinophils Absolute 10/14/2023 0.0  0.0 - 0.5 K/uL Final   Basophils Relative 10/14/2023 0  % Final   Basophils Absolute 10/14/2023 0.1  0.0 - 0.1 K/uL Final   Immature Granulocytes 10/14/2023 0  % Final   Abs Immature Granulocytes 10/14/2023 0.07  0.00 - 0.07 K/uL Final   Performed at [facility], [address], [place], [place] [address]    Allergies: Seroquel  [quetiapine  fumarate], Trazodone, and Escitalopram  Medications:  Facility Ordered Medications  Medication   alum & mag hydroxide-simeth (MAALOX/MYLANTA) 200-200-20 MG/5ML suspension 30 mL   magnesium  hydroxide (MILK OF MAGNESIA) suspension 30 mL   OLANZapine  zydis (ZYPREXA ) disintegrating tablet 5 mg   OLANZapine  (ZYPREXA ) injection 5 mg   doxepin  (SINEQUAN ) capsule 50 mg   cloNIDine  (CATAPRES ) tablet 0.1 mg   gabapentin  (NEURONTIN ) capsule 300 mg   hydrOXYzine  (ATARAX ) tablet 10 mg   [START ON 04/07/2024] levothyroxine  (SYNTHROID ) tablet 25 mcg   losartan  (COZAAR ) tablet 50 mg   mirtazapine  (REMERON ) tablet 7.5 mg   omega-3 acid ethyl esters (LOVAZA ) capsule 1 g   propranolol  (INDERAL ) tablet 20 mg   rosuvastatin  (CRESTOR ) tablet 40 mg   [START ON 04/07/2024] venlafaxine  XR (EFFEXOR -XR) 24 hr capsule 225 mg   PTA Medications  Medication Sig   omega-3 acid ethyl esters (LOVAZA ) 1 g capsule Take 1 capsule (1 g total) by mouth 2 (two) times daily.   antiseptic oral rinse (BIOTENE) LIQD 15 mLs by Mouth Rinse route as needed for dry mouth.    propranolol  (INDERAL ) 20 MG tablet Take 1 tablet (20 mg total) by mouth 2 (two) times daily.   mirtazapine  (REMERON ) 7.5 MG tablet Take 1 tablet (7.5 mg total) by mouth at bedtime.   venlafaxine  XR (EFFEXOR -XR) 75 MG 24 hr capsule Take 3 capsules (225 mg total) by mouth daily with breakfast.   gabapentin  (NEURONTIN ) 300 MG capsule Take 1 capsule (300 mg total) by mouth 3 (three) times daily.   levothyroxine  (SYNTHROID ) 25 MCG tablet Take 1 tablet (25 mcg total) by mouth daily before breakfast.   losartan  (COZAAR ) 50 MG tablet Take 1 tablet (50 mg total) by mouth daily.   doxepin  (SINEQUAN ) 50 MG capsule Take 1 capsule (50 mg total) by mouth at bedtime.   rosuvastatin  (CRESTOR ) 40 MG tablet Take 1 tablet (40 mg total) by mouth daily.      Medical Decision Making  Patient will be admitted to [facility] behavioral health continuous observation while awaiting inpatient psychiatric treatment.  Patient remains voluntary at this time.  Laboratory studies ordered including CBC, CMP, ethanol,  magnesium , prolactin and TSH.  Urine pregnancy, urine drug screen ordered.  EKG order initiated.  Restart prior to admission medications including: -Clonidine  0.1 oral mg daily -Doxepin  50 mg oral nightly -Gabapentin  300 mg oral 3 times daily -Hydroxyzine  10 mg oral 3 times daily as needed/anxiety -Levothyroxine  25 mcg orally daily before breakfast -Losartan  50 mg oral daily -Mirtazapine  7.5 mg nightly -Omega-3 acid ethyl esters capsule 1 g p.o. twice daily -Propranolol  20 mg twice daily -Rosuvastatin  40 mg oral  daily -Venlafaxine  XR 24-hour capsule to 25 mg oral daily with breakfast  Current medications: -Maalox 30 mL oral every 4 hours, as needed/digestion -Magnesium  hydroxide 30 mL daily as needed/mild constipation   Agitation protocol: -Olanzapine  Zydis disintegrating tablet 5 mg 3 times daily as needed/moderate agitation -Olanzapine  injection 5 mg IM 3 times daily as needed/severe  agitation     Recommendations  Based on my evaluation the patient does not appear to have an emergency medical condition.  Dawn LITTIE Dawn, FNP 04/06/24  12:27 PM
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
## 2024-04-07 LAB — PROLACTIN: Prolactin: 4.2 ng/mL (ref 3.6–25.2)

## 2024-06-11 ENCOUNTER — Ambulatory Visit (INDEPENDENT_AMBULATORY_CARE_PROVIDER_SITE_OTHER): Payer: Self-pay | Admitting: Registered Nurse

## 2024-06-11 ENCOUNTER — Encounter (HOSPITAL_COMMUNITY): Payer: Self-pay | Admitting: Registered Nurse

## 2024-06-11 DIAGNOSIS — Z7689 Persons encountering health services in other specified circumstances: Secondary | ICD-10-CM

## 2024-06-11 DIAGNOSIS — F411 Generalized anxiety disorder: Secondary | ICD-10-CM

## 2024-06-11 NOTE — Patient Instructions (Signed)
 If no one has contacted, you by the end of business day today please call the appropriate office listed below to schedule your next visit for medication management with Luisa Ruder, NP:    [facility] [address] [place], [place], [place], [place] [address]  Phone: [phone] (Call to schedule appointment)  [facility] [address] [place], [place] [address] Phone: [phone] (Call to schedule appointment)   If you experience suicidal or homicidal thoughts, hallucinations, or a severe decline in your mental health, seek immediate help. You can: Call 911 Call [address] [facility] Suicide Prevention Lifeline) 248-169-7687. This is a hotline for Spanish speakers. Use mobile crisis services Go to the nearest emergency room Veterans can also: Call 988 and press 1 Text ([phone] Mental health includes understanding and managing your emotions and behaviors in healthy ways. If you notice signs of emotional or mental distress, reach out to trusted supports--family, friends, healthcare providers, or mental health professionals. Healthy mental habits include stress-management skills, calming strategies, regular exercise, good sleep, healthy eating, and maintaining supportive relationships. This information does not replace advice from your healthcare provider--discuss any questions or concerns directly with them.  Mobile Crisis Response Teams Listed by counties in vicinity of [facility] providers [facility] [phone] [facility] [phone] [facility] [phone] [facility] [phone] [place]                * [facility] [phone]                * [facility] [employer] [phone]  [facility]  [phone] [facility] [employer]  [phone] * [facility] [employer] [phone]   Call 988 (National Suicide Prevention Lifeline)

## 2024-06-11 NOTE — Progress Notes (Signed)
 Dawn Kelley Scheduled for a virtual visit today but unable to complete related to connection problems on Dawn Kelley's end.    Unable to complete assessment related to connection problem (going in/out, freezing up, unable to hear).  Will have staff to reschedule ASAP for an in person visit or a virtual visit done at the office.  Connection going in/out, freezing up, couldn't hear her.  Will have staff to reschedule ASAP for an in person visit or a virtual visit done at the office.    Karl Erway B. Nyhla Mountjoy, NP

## 2024-11-16 ENCOUNTER — Ambulatory Visit (HOSPITAL_COMMUNITY): Admitting: Registered Nurse

## 2024-12-14 ENCOUNTER — Ambulatory Visit (HOSPITAL_COMMUNITY): Admitting: Registered Nurse
